# Patient Record
Sex: Male | Born: 1935 | Race: White | Hispanic: No | Marital: Married | Smoking: Never smoker
Health system: Southern US, Community
[De-identification: ages and names within clinical notes are randomized; demographics above are authoritative.]

## PROBLEM LIST (undated history)

## (undated) DIAGNOSIS — M1990 Unspecified osteoarthritis, unspecified site: Secondary | ICD-10-CM

## (undated) DIAGNOSIS — K635 Polyp of colon: Secondary | ICD-10-CM

## (undated) DIAGNOSIS — Z950 Presence of cardiac pacemaker: Secondary | ICD-10-CM

## (undated) DIAGNOSIS — Z87442 Personal history of urinary calculi: Secondary | ICD-10-CM

## (undated) DIAGNOSIS — M545 Low back pain, unspecified: Secondary | ICD-10-CM

## (undated) DIAGNOSIS — R001 Bradycardia, unspecified: Secondary | ICD-10-CM

## (undated) DIAGNOSIS — I10 Essential (primary) hypertension: Secondary | ICD-10-CM

## (undated) DIAGNOSIS — I5189 Other ill-defined heart diseases: Secondary | ICD-10-CM

## (undated) DIAGNOSIS — J849 Interstitial pulmonary disease, unspecified: Secondary | ICD-10-CM

## (undated) DIAGNOSIS — E119 Type 2 diabetes mellitus without complications: Secondary | ICD-10-CM

## (undated) DIAGNOSIS — N189 Chronic kidney disease, unspecified: Secondary | ICD-10-CM

## (undated) DIAGNOSIS — K5730 Diverticulosis of large intestine without perforation or abscess without bleeding: Secondary | ICD-10-CM

## (undated) DIAGNOSIS — K219 Gastro-esophageal reflux disease without esophagitis: Secondary | ICD-10-CM

## (undated) DIAGNOSIS — E559 Vitamin D deficiency, unspecified: Secondary | ICD-10-CM

## (undated) DIAGNOSIS — E785 Hyperlipidemia, unspecified: Secondary | ICD-10-CM

## (undated) DIAGNOSIS — I2510 Atherosclerotic heart disease of native coronary artery without angina pectoris: Secondary | ICD-10-CM

## (undated) HISTORY — DX: Hyperlipidemia, unspecified: E78.5

## (undated) HISTORY — PX: CATARACT EXTRACTION, BILATERAL: SHX1313

## (undated) HISTORY — DX: Other ill-defined heart diseases: I51.89

## (undated) HISTORY — DX: Diverticulosis of large intestine without perforation or abscess without bleeding: K57.30

## (undated) HISTORY — DX: Atherosclerotic heart disease of native coronary artery without angina pectoris: I25.10

## (undated) HISTORY — DX: Low back pain: M54.5

## (undated) HISTORY — DX: Interstitial pulmonary disease, unspecified: J84.9

## (undated) HISTORY — DX: Type 2 diabetes mellitus without complications: E11.9

## (undated) HISTORY — DX: Low back pain, unspecified: M54.50

## (undated) HISTORY — PX: TRIGGER FINGER RELEASE: SHX641

## (undated) HISTORY — DX: Polyp of colon: K63.5

## (undated) HISTORY — DX: Unspecified osteoarthritis, unspecified site: M19.90

## (undated) HISTORY — DX: Essential (primary) hypertension: I10

## (undated) HISTORY — DX: Vitamin D deficiency, unspecified: E55.9

## (undated) HISTORY — PX: CARPAL TUNNEL RELEASE: SHX101

---

## 1978-10-23 HISTORY — PX: INGUINAL HERNIA REPAIR: SUR1180

## 1998-12-06 ENCOUNTER — Ambulatory Visit (HOSPITAL_COMMUNITY): Admission: RE | Admit: 1998-12-06 | Discharge: 1998-12-06 | Payer: Self-pay | Admitting: Gastroenterology

## 2001-12-10 ENCOUNTER — Ambulatory Visit (HOSPITAL_COMMUNITY): Admission: RE | Admit: 2001-12-10 | Discharge: 2001-12-10 | Payer: Self-pay | Admitting: Gastroenterology

## 2001-12-10 ENCOUNTER — Encounter (INDEPENDENT_AMBULATORY_CARE_PROVIDER_SITE_OTHER): Payer: Self-pay | Admitting: *Deleted

## 2002-12-10 ENCOUNTER — Encounter: Admission: RE | Admit: 2002-12-10 | Discharge: 2003-03-10 | Payer: Self-pay | Admitting: Family Medicine

## 2003-03-24 ENCOUNTER — Encounter: Admission: RE | Admit: 2003-03-24 | Discharge: 2003-06-22 | Payer: Self-pay | Admitting: Family Medicine

## 2003-03-26 ENCOUNTER — Ambulatory Visit (HOSPITAL_BASED_OUTPATIENT_CLINIC_OR_DEPARTMENT_OTHER): Admission: RE | Admit: 2003-03-26 | Discharge: 2003-03-26 | Payer: Self-pay | Admitting: Orthopedic Surgery

## 2003-09-23 ENCOUNTER — Ambulatory Visit (HOSPITAL_COMMUNITY): Admission: RE | Admit: 2003-09-23 | Discharge: 2003-09-23 | Payer: Self-pay | Admitting: Orthopedic Surgery

## 2003-09-23 ENCOUNTER — Ambulatory Visit (HOSPITAL_BASED_OUTPATIENT_CLINIC_OR_DEPARTMENT_OTHER): Admission: RE | Admit: 2003-09-23 | Discharge: 2003-09-23 | Payer: Self-pay | Admitting: Orthopedic Surgery

## 2005-10-23 HISTORY — PX: KNEE ARTHROSCOPY: SUR90

## 2009-02-20 DIAGNOSIS — I2510 Atherosclerotic heart disease of native coronary artery without angina pectoris: Secondary | ICD-10-CM

## 2009-02-20 HISTORY — DX: Atherosclerotic heart disease of native coronary artery without angina pectoris: I25.10

## 2009-02-22 ENCOUNTER — Encounter: Payer: Self-pay | Admitting: Internal Medicine

## 2009-03-08 ENCOUNTER — Encounter: Admission: RE | Admit: 2009-03-08 | Discharge: 2009-03-08 | Payer: Self-pay | Admitting: Cardiology

## 2009-03-16 ENCOUNTER — Encounter: Payer: Self-pay | Admitting: Cardiothoracic Surgery

## 2009-03-16 ENCOUNTER — Inpatient Hospital Stay (HOSPITAL_COMMUNITY): Admission: RE | Admit: 2009-03-16 | Discharge: 2009-03-22 | Payer: Self-pay | Admitting: Cardiology

## 2009-03-16 ENCOUNTER — Ambulatory Visit: Payer: Self-pay | Admitting: Cardiothoracic Surgery

## 2009-03-17 HISTORY — PX: CORONARY ARTERY BYPASS GRAFT: SHX141

## 2009-04-09 ENCOUNTER — Ambulatory Visit: Payer: Self-pay | Admitting: Cardiothoracic Surgery

## 2009-04-09 ENCOUNTER — Encounter: Admission: RE | Admit: 2009-04-09 | Discharge: 2009-04-09 | Payer: Self-pay | Admitting: Cardiothoracic Surgery

## 2009-04-30 ENCOUNTER — Ambulatory Visit: Payer: Self-pay | Admitting: Cardiothoracic Surgery

## 2009-09-14 ENCOUNTER — Encounter: Payer: Self-pay | Admitting: Internal Medicine

## 2009-09-14 ENCOUNTER — Encounter: Payer: Self-pay | Admitting: Cardiovascular Disease

## 2009-09-14 LAB — CONVERTED CEMR LAB
Cholesterol: 132 mg/dL
LDL Cholesterol: 54 mg/dL

## 2009-11-24 ENCOUNTER — Ambulatory Visit: Payer: Self-pay | Admitting: Cardiothoracic Surgery

## 2009-11-30 ENCOUNTER — Ambulatory Visit: Payer: Self-pay | Admitting: Cardiovascular Disease

## 2009-11-30 ENCOUNTER — Ambulatory Visit (HOSPITAL_COMMUNITY): Admission: RE | Admit: 2009-11-30 | Discharge: 2009-11-30 | Payer: Self-pay | Admitting: Cardiothoracic Surgery

## 2009-11-30 ENCOUNTER — Encounter: Payer: Self-pay | Admitting: Cardiothoracic Surgery

## 2009-12-01 ENCOUNTER — Ambulatory Visit: Payer: Self-pay | Admitting: Cardiovascular Disease

## 2009-12-01 DIAGNOSIS — R0602 Shortness of breath: Secondary | ICD-10-CM

## 2009-12-01 DIAGNOSIS — E785 Hyperlipidemia, unspecified: Secondary | ICD-10-CM | POA: Insufficient documentation

## 2009-12-01 DIAGNOSIS — R0609 Other forms of dyspnea: Secondary | ICD-10-CM | POA: Insufficient documentation

## 2009-12-01 DIAGNOSIS — E0822 Diabetes mellitus due to underlying condition with diabetic chronic kidney disease: Secondary | ICD-10-CM | POA: Insufficient documentation

## 2009-12-01 DIAGNOSIS — R079 Chest pain, unspecified: Secondary | ICD-10-CM | POA: Insufficient documentation

## 2009-12-01 DIAGNOSIS — E1149 Type 2 diabetes mellitus with other diabetic neurological complication: Secondary | ICD-10-CM

## 2009-12-01 DIAGNOSIS — I10 Essential (primary) hypertension: Secondary | ICD-10-CM | POA: Insufficient documentation

## 2009-12-01 DIAGNOSIS — R072 Precordial pain: Secondary | ICD-10-CM | POA: Insufficient documentation

## 2009-12-01 DIAGNOSIS — I25810 Atherosclerosis of coronary artery bypass graft(s) without angina pectoris: Secondary | ICD-10-CM | POA: Insufficient documentation

## 2009-12-08 ENCOUNTER — Encounter: Admission: RE | Admit: 2009-12-08 | Discharge: 2009-12-08 | Payer: Self-pay | Admitting: Cardiothoracic Surgery

## 2009-12-08 ENCOUNTER — Ambulatory Visit: Payer: Self-pay | Admitting: Cardiothoracic Surgery

## 2009-12-15 ENCOUNTER — Ambulatory Visit: Payer: Self-pay | Admitting: Cardiothoracic Surgery

## 2009-12-17 ENCOUNTER — Encounter: Payer: Self-pay | Admitting: Cardiovascular Disease

## 2010-03-11 ENCOUNTER — Encounter: Payer: Self-pay | Admitting: Internal Medicine

## 2010-03-11 LAB — CONVERTED CEMR LAB
ALT: 17 units/L
AST: 16 units/L
Alkaline Phosphatase: 58 units/L
Calcium: 9.5 mg/dL
Chloride: 102 meq/L
Cholesterol: 153 mg/dL
HDL: 51 mg/dL
Hgb A1c MFr Bld: 6.5 %
Total Bilirubin: 0.5 mg/dL
Total Protein: 7 g/dL

## 2010-03-16 ENCOUNTER — Encounter: Payer: Self-pay | Admitting: Internal Medicine

## 2010-09-22 ENCOUNTER — Encounter: Payer: Self-pay | Admitting: Internal Medicine

## 2010-09-22 LAB — CONVERTED CEMR LAB
AST: 18 units/L
Alkaline Phosphatase: 62 units/L
BUN: 19 mg/dL
CO2: 23 meq/L
Calcium: 9.5 mg/dL
Cholesterol: 146 mg/dL
Creatinine, Ser: 0.82 mg/dL
Glucose, Bld: 113 mg/dL
HDL: 52 mg/dL
Hgb A1c MFr Bld: 6.7 %
Triglyceride fasting, serum: 148 mg/dL

## 2010-10-26 ENCOUNTER — Telehealth: Payer: Self-pay | Admitting: Cardiovascular Disease

## 2010-10-28 ENCOUNTER — Encounter: Payer: Self-pay | Admitting: Cardiology

## 2010-10-31 ENCOUNTER — Encounter: Payer: Self-pay | Admitting: Cardiovascular Disease

## 2010-11-03 ENCOUNTER — Encounter: Payer: Self-pay | Admitting: Internal Medicine

## 2010-11-03 DIAGNOSIS — M1990 Unspecified osteoarthritis, unspecified site: Secondary | ICD-10-CM | POA: Insufficient documentation

## 2010-11-03 DIAGNOSIS — Z860100 Personal history of colon polyps, unspecified: Secondary | ICD-10-CM | POA: Insufficient documentation

## 2010-11-03 DIAGNOSIS — K5730 Diverticulosis of large intestine without perforation or abscess without bleeding: Secondary | ICD-10-CM | POA: Insufficient documentation

## 2010-11-03 DIAGNOSIS — E559 Vitamin D deficiency, unspecified: Secondary | ICD-10-CM | POA: Insufficient documentation

## 2010-11-10 ENCOUNTER — Telehealth: Payer: Self-pay | Admitting: Cardiovascular Disease

## 2010-11-11 ENCOUNTER — Encounter: Payer: Self-pay | Admitting: Cardiovascular Disease

## 2010-11-20 LAB — CONVERTED CEMR LAB
AST: 11 units/L
Alkaline Phosphatase: 59 units/L
BUN: 25 mg/dL
CO2: 24 meq/L
Calcium: 9.2 mg/dL
Creatinine, Ser: 0.85 mg/dL
HDL: 46 mg/dL
LDL Cholesterol: 54 mg/dL
Total Bilirubin: 0.4 mg/dL
Total Protein: 6.4 g/dL
Triglyceride fasting, serum: 158 mg/dL

## 2010-11-24 NOTE — Letter (Signed)
Summary: Medication Clearance  Medication Clearance   Imported By: Harlon Flor 11/15/2010 16:54:25  _____________________________________________________________________  External Attachment:    Type:   Image     Comment:   External Document

## 2010-11-24 NOTE — Progress Notes (Signed)
Summary: PHI  PHI   Imported By: Harlon Flor 12/07/2009 16:07:06  _____________________________________________________________________  External Attachment:    Type:   Image     Comment:   External Document

## 2010-11-24 NOTE — Letter (Signed)
Summary: Clearance Letter  Architectural technologist at [facility] [address]   [place], [place] [address]   Phone: [phone]  Fax: [phone]    November 11, 2010  Re:     [facility] Address:   [address]     [place], [place]  [address] DOB:     Aug 04, 1934 MRN:     [id number]   Dear Pamalee Leyden,  Mr. Chio may stop Plavix 7 days prior to epidural injection. He should resume Plavix post operatively.             Sincerely,          Dr.Tim Mariah Milling

## 2010-11-24 NOTE — Progress Notes (Signed)
Summary: Plavix  Phone Note Call from Patient Call back at Home Phone [phone]   Caller: Patient Call For: [place] [employer] Details for Reason: Plavix Summary of Call: pt is having another epidural injection sometime after 11/28/2010 with Dr. Monica Martinez. Is it ok for him to come off Plavix for this injection? He recently came off Plavix for a injection and doctor stated he will need another one.  fax letter to [phone] attn: Darl Pikes Initial call taken by: Lysbeth Galas CMA,  November 10, 2010 12:50 PM  Follow-up for Phone Call        Please advise. Follow-up by: Lanny Hurst RN,  November 10, 2010 2:08 PM  Additional Follow-up for Phone Call Additional follow up Details #1::        OK to come off plavix, restart after procedure as before     Appended Document: Plavix Clearance letter sent to Dr.Ibazbeo/sab

## 2010-11-24 NOTE — Miscellaneous (Signed)
Summary: lab results  Clinical Lists Changes  Observations: Added new observation of HGBA1C: 7.0 % (09/14/2009 15:37) Added new observation of TRIGLYCERIDE: 158 mg/dL (16/07/9603 54:09) Added new observation of HDL: 46 mg/dL (81/19/1478 29:56) Added new observation of LDL: 54 mg/dL (21/30/8657 84:69) Added new observation of GFRAA: >59 mL/min (09/14/2009 15:37) Added new observation of GFR: >59 mL/min (09/14/2009 15:37) Added new observation of ALBUMIN: 4.2 g/dL (62/95/2841 32:44) Added new observation of PROTEIN, TOT: 6.4 g/dL (10/25/7251 66:44) Added new observation of CHOLESTEROL: 132 mg/dL (03/47/4259 56:38) Added new observation of CALCIUM: 9.2 mg/dL (75/64/3329 51:88) Added new observation of ALK PHOS: 59 units/L (09/14/2009 15:37) Added new observation of BILI TOTAL: 0.4 mg/dL (41/66/0630 16:01) Added new observation of SGPT (ALT): 17 units/L (09/14/2009 15:37) Added new observation of SGOT (AST): 11 units/L (09/14/2009 15:37) Added new observation of CO2 PLSM/SER: 24 meq/L (09/14/2009 15:37) Added new observation of CL SERUM: 103 meq/L (09/14/2009 15:37) Added new observation of K SERUM: 4.5 meq/L (09/14/2009 15:37) Added new observation of NA: 140 meq/L (09/14/2009 15:37) Added new observation of CREATININE: 0.85 mg/dL (09/32/3557 32:20) Added new observation of BUN: 25 mg/dL (25/42/7062 37:62) Added new observation of BG RANDOM: 106 mg/dL (83/15/1761 60:73)      -  Date:  09/14/2009    BG Random: 106    BUN: 25    Creatinine: 0.85    Sodium: 140    Potassium: 4.5    Chloride: 103    CO2 Total: 24    SGOT (AST): 11    SGPT (ALT): 17    T. Bilirubin: 0.4    Alk Phos: 59    Calcium: 9.2    Cholesterol: 132    Total Protein: 6.4    Albumin: 4.2    GFR(Non African American): >59    GFR(African American) >59    LDL: 54    HDL: 46    Triglycerides: 710    HgbA1c: 7.0

## 2010-11-24 NOTE — Letter (Signed)
Summary: Clearance Letter  Architectural technologist at [address]   [place], [place] [address]   Phone: [phone]  Fax: [phone]    October 31, 2010  Re:     [facility] Address:   [address]     [place], [place]  [address] DOB:     Jan 24, 1934 MRN:     [id number]   Dear Dr. Romero Belling,   Mr. Colarusso may stop Plavix 7 days prior to epidural injection.  He should resume the Plavix post-operatively.           Sincerely,    Dossie Arbour, M.D.

## 2010-11-24 NOTE — Assessment & Plan Note (Signed)
Summary: np6   Visit Type:  New patient Primary Provider:  Ursula Beath  CC:  SOB wit exsertion, chest discomfort, and no swelling.  History of Present Illness: Andrew [name] is a very pleasant 77 year old gentleman with history of coronary artery disease, bypass in May 2010, hyperlipidemia, diabetes, hypertension, who presents for evaluation of recent chest pain.  He states he's had chest pain on the left side of his chest that is tender to palpation, worse with cough and sneezing. He had bronchitis in November of 2010 and his symptoms have persisted since then. He was recently seen by his  surgeon in [place], Dr. Alla German, and started on a muscle relaxant medication. He states that this is helped a little bit though it continues to hurt. He wanted further evaluation. He states that it hurts when he lifts things that are heavy with his left arm. He denies any significant shortness of breath. He is otherwise very active with no significant chest pain. He describes it is superficial, worse with movement.  Preventive Screening-Counseling & Management  Alcohol-Tobacco     Alcohol drinks/day: <1     Smoking Status: never  Caffeine-Diet-Exercise     Caffeine use/day: 1 cup of coffee a day     Does Patient Exercise: no      Drug Use:  no.    Current Problems (verified): 1)  Dm  (ICD-250.00) 2)  Shortness of Breath  (ICD-786.05)  Current Medications (verified): 1)  Plavix 75 Mg Tabs (Clopidogrel Bisulfate) .... Take One Tablet By Mouth Daily 2)  Aspirin 81 Mg Tbec (Aspirin) .... Take One Tablet By Mouth Daily 3)  Crestor 20 Mg Tabs (Rosuvastatin Calcium) .... Take One Tablet By Mouth Daily. 4)  Niaspan 1000 Mg Cr-Tabs (Niacin (Antihyperlipidemic)) .Marland Kitchen.. 1 Tab By Mouth Once Daily 5)  Metformin Hcl 500 Mg Tabs (Metformin Hcl) .... 2 Tab By Mouth Once Daily 6)  Vitamin D 2000 Unit Caps (Cholecalciferol) .Marland Kitchen.. 1 Capsule By Mouth Once Daily 7)  Carvedilol 6.25 Mg Tabs (Carvedilol) ....  Take 1/2  Tablet By Mouth Twice A Day 8)  Lisinopril 10 Mg Tabs (Lisinopril) .... Take 1/2  Tablet By Mouth Daily 9)  Tramadol Hcl 50 Mg Tabs (Tramadol Hcl) .... As Needed  Allergies (verified): 1)  ! * Oral Penicillin  Past History:  Past Medical History: Last updated: 11/29/2009  Unstable angina with severe left main and three-vessel   coronary artery disease.    Acute blood loss anemia postoperatively.   Volume overload postoperatively.   Bleeding following multivessel coronary artery bypass grafting,       coagulopathy.    Diabetes mellitus.   Dyslipidemia.   Status post arthroscopic right knee surgery in 2007.   Inguinal hernia repair in 1980.   Family History: Last updated: 12/01/2009 positive for diabetes.  Negative for coronary artery disease Father: Deceased at 21 years old of CHF Mother: Deceased at 20 years old of CHF Family History of Diabetes:   Social History: Last updated: 12/01/2009 Retired 7/03 Tobacco Use - No.  Alcohol Use - no Alcohol Use - yes Regular Exercise - no Drug Use - no Married   Risk Factors: Alcohol Use: <1 (12/01/2009) Caffeine Use: 1 cup of coffee a day (12/01/2009) Exercise: no (12/01/2009)  Risk Factors: Smoking Status: never (12/01/2009)  Past Surgical History: CABG Quadruple bypass surgery 03/17/09  Family History: positive for diabetes.  Negative for coronary artery disease Father: Deceased at 66 years old of CHF Mother: Deceased at 67 years old  of CHF Family History of Diabetes:   Social History: Retired 7/03 Tobacco Use - No.  Alcohol Use - no Alcohol Use - yes Regular Exercise - no Drug Use - no Married  Alcohol drinks/day:  <1 Caffeine use/day:  1 cup of coffee a day Does Patient Exercise:  no Drug Use:  no  Vital Signs:  Patient profile:   77 year old male Height:      68 inches Weight:      191.50 pounds BMI:     29.22 Pulse (ortho):   60 / minute Pulse rhythm:   regular BP sitting:   118 / 70   (left arm) Cuff size:   regular  Vitals Entered By: Mercer Pod (December 01, 2009 10:25 AM)  Physical Exam  General:  HEENT exam is benign, oropharynx is clear,  the neck is supple with no JVP or carotid bruits, heart sounds are regular with normal S1-S2 and no murmur, lungs are clear to auscultation with no wheezes no Rales, chest is tender to palpation on the left, abdominal exam is benign, no significant lower extremity edema, pulses are equal and symmetrical in his upper and lower extremities. Neurologic exam is grossly nonfocal,  skin is warm and dry.    EKG  Procedure date:  12/01/2009  Findings:      EKG shows normal sinus rhythm, rate of 63 beats per minute, no significant ST or T wave changes.  Impression & Recommendations:  Problem # 1:  CHEST PAIN-UNSPECIFIED (ICD-786.50) several months of chest pain that is clearly musculoskeletal. I have suggested that he add a nonsteroidal anti-inflammatory to his Flexeril. He can also use moist heat, no heavy lifting. No other cardiac testing needed at this time. His updated medication list for this problem includes:    Plavix 75 Mg Tabs (Clopidogrel bisulfate) .Marland Kitchen... Take one tablet by mouth daily    Aspirin 81 Mg Tbec (Aspirin) .Marland Kitchen... Take one tablet by mouth daily    Carvedilol 6.25 Mg Tabs (Carvedilol) .Marland Kitchen... Take 1/2  tablet by mouth twice a day    Lisinopril 10 Mg Tabs (Lisinopril) .Marland Kitchen... Take 1/2  tablet by mouth daily  Problem # 2:  DIAB W/O COMP TYPE II/UNS NOT STATED UNCNTRL (ICD-250.00) his metformin was recently raised to b.i.d. for improved sugar control. He continues to exercise on a frequent basis and tries to watch his diet. We'll try to obtain his most recent hemoglobin A1c. His updated medication list for this problem includes:    Aspirin 81 Mg Tbec (Aspirin) .Marland Kitchen... Take one tablet by mouth daily    Metformin Hcl 500 Mg Tabs (Metformin hcl) .Marland Kitchen... 2 tab by mouth once daily    Lisinopril 10 Mg Tabs (Lisinopril) .Marland Kitchen...  Take 1/2  tablet by mouth daily  Problem # 3:  CAD, ARTERY BYPASS GRAFT (ICD-414.04) bypass last year and is doing well. He states that he had an echocardiogram at [facility] yesterday we'll try to obtain this report for our records. Will also try to obtain his most recent lipid panel. His updated medication list for this problem includes:    Plavix 75 Mg Tabs (Clopidogrel bisulfate) .Marland Kitchen... Take one tablet by mouth daily    Aspirin 81 Mg Tbec (Aspirin) .Marland Kitchen... Take one tablet by mouth daily    Carvedilol 6.25 Mg Tabs (Carvedilol) .Marland Kitchen... Take 1/2  tablet by mouth twice a day    Lisinopril 10 Mg Tabs (Lisinopril) .Marland Kitchen... Take 1/2  tablet by mouth daily  Problem # 4:  HYPERLIPIDEMIA-MIXED (ICD-272.4) currently taking Crestor and Niaspan. We'll try to obtain his most recent lipid panel from Dr. Raquel James from December 2010. Follow up in clinic in 1 years time. His updated medication list for this problem includes:    Crestor 20 Mg Tabs (Rosuvastatin calcium) .Marland Kitchen... Take one tablet by mouth daily.    Niaspan 1000 Mg Cr-tabs (Niacin (antihyperlipidemic)) .Marland Kitchen... 1 tab by mouth once daily

## 2010-11-24 NOTE — Progress Notes (Signed)
Summary: Discontinuing Plavix  Phone Note Call from Patient Call back at Home Phone [phone] Message from:  Patient on October 26, 2010 9:24 AM  Caller: Self Call For: [facility] Summary of Call: Pt had an MRI and it was determined that he had a buldging disk.  They are wanting to give him a spinal epidural.  Pt is on Plavix and it needs to be stopped 7 days prior to injection.  Dr. Romero Belling needs a letter clearing him from the Plavix.  fax # [phone]  Attn: Darl Pikes Initial call taken by: Harlon Flor,  October 26, 2010 9:26 AM  Follow-up for Phone Call        Notified patient regarding plavix.  He was put on plavix after CABG in November 2010. Follow-up by: Bishop Dublin, CMA,  October 27, 2010 10:55 AM  Additional Follow-up for Phone Call Additional follow up Details #1::        that should be fine. Would go back on plavix after surgery     Appended Document: Discontinuing Plavix Clearance letter faxed to Dr.Angus Ibazebo to att. Darl Pikes.

## 2010-11-24 NOTE — Letter (Signed)
Summary: Clearance Letter  Architectural technologist at [facility] [address]   [place], [place] [address]   Phone: [phone]  Fax: [phone]    October 28, 2010    Re:     [facility] Address:   [address]     [place], [place]  [address] DOB:     [birth year] MRN:     [id number]   Dear Dr. Romero Belling,    Mr. Munford may stop Plavix 7 days prior to epidural injection.            Sincerely,  Dossie Arbour, M.D.

## 2010-11-25 ENCOUNTER — Ambulatory Visit: Admit: 2010-11-25 | Payer: Self-pay | Admitting: Cardiovascular Disease

## 2010-11-25 ENCOUNTER — Ambulatory Visit (INDEPENDENT_AMBULATORY_CARE_PROVIDER_SITE_OTHER): Payer: BC Managed Care – PPO | Admitting: Cardiovascular Disease

## 2010-11-25 ENCOUNTER — Encounter: Payer: Self-pay | Admitting: Cardiovascular Disease

## 2010-11-25 DIAGNOSIS — E785 Hyperlipidemia, unspecified: Secondary | ICD-10-CM

## 2010-11-25 DIAGNOSIS — I2510 Atherosclerotic heart disease of native coronary artery without angina pectoris: Secondary | ICD-10-CM

## 2010-11-25 DIAGNOSIS — I10 Essential (primary) hypertension: Secondary | ICD-10-CM

## 2010-11-30 NOTE — Assessment & Plan Note (Signed)
Summary: F/U 1 YEAR/AMD   Vital Signs:  Patient profile:   77 year old male Height:      68 inches Weight:      197.50 pounds BMI:     30.14 Pulse rate:   67 / minute BP sitting:   122 / 80  (left arm) Cuff size:   regular  Vitals Entered By: Lysbeth Galas CMA (November 25, 2010 3:15 PM)  Visit Type:  Follow-up Primary Provider:  Dr. Annye Rusk  CC:  c/o fatigue and SOB after walking up stairs. denies chest pains. .  History of Present Illness: Andrew [name] is a very pleasant 77 year old gentleman with history of coronary artery disease, bypass in May 2010, hyperlipidemia, diabetes, hypertension, who presents for Routine evaluation.  overall he has been doing well apart from severe back pain. Previously he was on Celebrex which has helped his back to some degree. His head to hydrocortisone shots but continues to have pain. No history in his right knee from favoring one side over the other side secondary to his back. He does tire easily with stairs with some shortness of breath he was able to ablate on a flat surface without any significant symptoms. He believes he needs 2 more shocks in his back and we'll need to stop his Plavix today for shots next week.  Again he denies any significant chest pain or previous anginal equivalent symptoms. His weight has been stable. Otherwise he feels well.  EKG shows normal sinus rhythm with rate 67 beats per minute, right bundle branch block.  Current Medications (verified): 1)  Plavix 75 Mg Tabs (Clopidogrel Bisulfate) .... Take One Tablet By Mouth Daily 2)  Aspirin 81 Mg Tbec (Aspirin) .... Take One Tablet By Mouth Daily 3)  Crestor 20 Mg Tabs (Rosuvastatin Calcium) .... Take One Tablet By Mouth Daily. 4)  Niaspan 1000 Mg Cr-Tabs (Niacin (Antihyperlipidemic)) .Marland Kitchen.. 1 Tab By Mouth Once Daily 5)  Metformin Hcl 500 Mg Tabs (Metformin Hcl) .... 2 Tab By Mouth Once Daily 6)  Vitamin D 2000 Unit Caps (Cholecalciferol) .Marland Kitchen.. 1 Capsule By Mouth Once Daily 7)   Carvedilol 6.25 Mg Tabs (Carvedilol) .... Take 1/2  Tablet By Mouth Twice A Day 8)  Lisinopril 10 Mg Tabs (Lisinopril) .... Take 1/2  Tablet By Mouth Daily 9)  Celebrex 200 Mg Caps (Celecoxib) .Marland Kitchen.. 1 Tablet Once Daily  Allergies (verified): 1)  ! * Oral Penicillin  Past History:  Past Medical History: Last updated: 11/03/2010  Unstable angina with severe left main and three-vessel   coronary artery disease.    Acute blood loss anemia postoperatively.   Volume overload postoperatively.   Bleeding following multivessel coronary artery bypass grafting,       coagulopathy.    Diabetes mellitus.   Dyslipidemia.   Status post arthroscopic right knee surgery in 2007.   Inguinal hernia repair in 1980.  Colonic polyps, hx of Diverticulosis, colon  Past Surgical History: Last updated: 12/01/2009 CABG Quadruple bypass surgery 03/17/09  Family History: Last updated: 12/01/2009 positive for diabetes.  Negative for coronary artery disease Father: Deceased at 39 years old of CHF Mother: Deceased at 23 years old of CHF Family History of Diabetes:   Social History: Last updated: 12/01/2009 Retired 7/03 Tobacco Use - No.  Alcohol Use - no Alcohol Use - yes Regular Exercise - no Drug Use - no Married   Risk Factors: Alcohol Use: <1 (12/01/2009) Caffeine Use: 1 cup of coffee a day (12/01/2009) Exercise: no (12/01/2009)  Risk  Factors: Smoking Status: never (12/01/2009)  Review of Systems       The patient complains of dyspnea on exertion and difficulty walking.  The patient denies fever, weight loss, weight gain, vision loss, decreased hearing, hoarseness, chest pain, syncope, peripheral edema, prolonged cough, abdominal pain, incontinence, muscle weakness, depression, and enlarged lymph nodes.         Some SOB with stairs. Back pain.   Physical Exam  General:  HEENT exam is benign, oropharynx is clear,  the neck is supple with no JVP or carotid bruits, heart sounds are  regular with normal S1-S2 and no murmur, lungs are clear to auscultation with no wheezes no Rales, chest is tender to palpation on the left, abdominal exam is benign, no significant lower extremity edema, pulses are equal and symmetrical in his upper and lower extremities. Neurologic exam is grossly nonfocal,  skin is warm and dry. Skin:  Intact without lesions or rashes. Psych:  Normal affect.   Impression & Recommendations:  Problem # 1:  CAD, ARTERY BYPASS GRAFT (ICD-414.04) bypass grafting in 2010. No symptoms of angina. We'll continue aggressive medical management with no further testing at this time.  His updated medication list for this problem includes:    Plavix 75 Mg Tabs (Clopidogrel bisulfate) .Marland Kitchen... Take one tablet by mouth daily    Aspirin 81 Mg Tbec (Aspirin) .Marland Kitchen... Take one tablet by mouth daily    Carvedilol 6.25 Mg Tabs (Carvedilol) .Marland Kitchen... Take 1/2  tablet by mouth twice a day    Lisinopril 10 Mg Tabs (Lisinopril) .Marland Kitchen... Take 1/2  tablet by mouth daily  Problem # 2:  HYPERTENSION, BENIGN (ICD-401.1) Blood pressure is adequate and we'll continue him on his current medication regimen.  His updated medication list for this problem includes:    Aspirin 81 Mg Tbec (Aspirin) .Marland Kitchen... Take one tablet by mouth daily    Carvedilol 6.25 Mg Tabs (Carvedilol) .Marland Kitchen... Take 1/2  tablet by mouth twice a day    Lisinopril 10 Mg Tabs (Lisinopril) .Marland Kitchen... Take 1/2  tablet by mouth daily  Problem # 3:  HYPERLIPIDEMIA-MIXED (ICD-272.4) Previous cholesterol last year was well-controlled. Continue him on his current regimen.  His updated medication list for this problem includes:    Crestor 20 Mg Tabs (Rosuvastatin calcium) .Marland Kitchen... Take one tablet by mouth daily.    Niaspan 1000 Mg Cr-tabs (Niacin (antihyperlipidemic)) .Marland Kitchen... 1 tab by mouth once daily  Problem # 4:  SHORTNESS OF BREATH (ICD-786.05) He does have mild shortness of breath with exertion, such as stairs. Some of this could be secondary to  deconditioning. He is limited by his back.  His updated medication list for this problem includes:    Aspirin 81 Mg Tbec (Aspirin) .Marland Kitchen... Take one tablet by mouth daily    Carvedilol 6.25 Mg Tabs (Carvedilol) .Marland Kitchen... Take 1/2  tablet by mouth twice a day    Lisinopril 10 Mg Tabs (Lisinopril) .Marland Kitchen... Take 1/2  tablet by mouth daily  Orders: EKG w/ Interpretation (93000)   Orders Added: 1)  EKG w/ Interpretation [93000]

## 2010-12-15 ENCOUNTER — Encounter: Payer: Self-pay | Admitting: Internal Medicine

## 2010-12-15 ENCOUNTER — Ambulatory Visit (INDEPENDENT_AMBULATORY_CARE_PROVIDER_SITE_OTHER): Payer: 59 | Admitting: Internal Medicine

## 2010-12-15 DIAGNOSIS — I25810 Atherosclerosis of coronary artery bypass graft(s) without angina pectoris: Secondary | ICD-10-CM

## 2010-12-15 DIAGNOSIS — M129 Arthropathy, unspecified: Secondary | ICD-10-CM | POA: Insufficient documentation

## 2010-12-15 DIAGNOSIS — E119 Type 2 diabetes mellitus without complications: Secondary | ICD-10-CM

## 2010-12-15 DIAGNOSIS — I10 Essential (primary) hypertension: Secondary | ICD-10-CM

## 2010-12-15 DIAGNOSIS — E785 Hyperlipidemia, unspecified: Secondary | ICD-10-CM

## 2010-12-17 ENCOUNTER — Encounter: Payer: Self-pay | Admitting: Cardiovascular Disease

## 2010-12-20 NOTE — Assessment & Plan Note (Signed)
Summary: NEW/[employer]/# CD   Vital Signs:  Patient profile:   77 year old male Height:      68 inches (172.72 cm) Weight:      196.0 pounds (89.09 kg) O2 Sat:      97 % on Room air Temp:     98.7 degrees F (37.06 degrees C) oral Pulse rate:   70 / minute BP sitting:   102 / 68  (left arm) Cuff size:   regular  Vitals Entered By: Orlan Leavens RMA (December 15, 2010 9:44 AM)  O2 Flow:  Room air CC: New patient Is Patient Diabetic? Yes Did you bring your meter with you today? No Pain Assessment Patient in pain? no        Primary Care Provider:  Dr. Felicity Coyer  CC:  New patient.  History of Present Illness: new pt to me and our divison, here to est care prev at [facility] - known to our cards group  CAD s/p CABG 02/2009 - reports compliance with ongoing medical treatment and no changes in medication dose or frequency. denies adverse side effects related to current therapy.   DM2 -reports compliance with ongoing medical treatment and no changes in medication dose or frequency. denies adverse side effects related to current therapy.  infreq checks cgs at home  dyslipidemia - reports compliance with ongoing medical treatment and no changes in medication dose or frequency. denies adverse side effects related to current therapy.   HTN - reports compliance with ongoing medical treatment and no changes in medication dose or frequency. denies adverse side effects related to current therapy.   LBP - follows with ortho for same - s/p series of steroid injections for lumbar disc with RLE radiculopathy - improved back pain but cont B hip discomfort - ongoing care   Preventive Screening-Counseling & Management  Alcohol-Tobacco     Alcohol drinks/day: <1     Alcohol Counseling: not indicated; use of alcohol is not excessive or problematic     Smoking Status: never     Tobacco Counseling: not indicated; no tobacco use  Caffeine-Diet-Exercise     Caffeine use/day: 1 cup of coffee a day     Does  Patient Exercise: no     Exercise Counseling: to improve exercise regimen  Clinical Review Panels:  Prevention   Last Colonoscopy:  Pt states was done @ [facility], was normal was told to repeat every 5 years (09/22/2010)   Last PSA:  0.6 (09/22/2010)  Immunizations   Last Tetanus Booster:  Historical (11/07/2002)  Lipid Management   Cholesterol:  146 (09/22/2010)   LDL (bad choesterol):  64 (09/22/2010)   HDL (good cholesterol):  52 (09/22/2010)   Triglycerides:  148 (09/22/2010)  Diabetes Management   HgBA1C:  6.7 (09/22/2010)   Creatinine:  0.82 (09/22/2010)   Last Dilated Eye Exam:  normal (04/20/2010)  Complete Metabolic Panel   Glucose:  113 (09/22/2010)   Sodium:  142 (09/22/2010)   Potassium:  4.0 (09/22/2010)   Chloride:  105 (09/22/2010)   CO2:  23 (09/22/2010)   BUN:  19 (09/22/2010)   Creatinine:  0.82 (09/22/2010)   Albumin:  4.4 (09/22/2010)   Total Protein:  7.0 (09/22/2010)   Calcium:  9.5 (09/22/2010)   Total Bili:  0.4 (09/22/2010)   Alk Phos:  62 (09/22/2010)   SGPT (ALT):  19 (09/22/2010)   SGOT (AST):  18 (09/22/2010)   Current Medications (verified): 1)  Plavix 75 Mg Tabs (Clopidogrel Bisulfate) .... Take One Tablet By  Mouth Daily 2)  Aspirin 81 Mg Tbec (Aspirin) .... Take One Tablet By Mouth Daily 3)  Crestor 20 Mg Tabs (Rosuvastatin Calcium) .... Take One Tablet By Mouth Daily. 4)  Niaspan 1000 Mg Cr-Tabs (Niacin (Antihyperlipidemic)) .Marland Kitchen.. 1 Tab By Mouth Once Daily 5)  Metformin Hcl 500 Mg Tabs (Metformin Hcl) .... 2 Tab By Mouth Once Daily 6)  Vitamin D 2000 Unit Caps (Cholecalciferol) .Marland Kitchen.. 1 Capsule By Mouth Once Daily 7)  Carvedilol 6.25 Mg Tabs (Carvedilol) .... Take 1/2  Tablet By Mouth Twice A Day 8)  Lisinopril 10 Mg Tabs (Lisinopril) .... Take 1/2  Tablet By Mouth Daily 9)  Celebrex 200 Mg Caps (Celecoxib) .Marland Kitchen.. 1 Tablet Once Daily  Allergies (verified): 1)  ! * Oral Penicillin  Past History:  Past Medical History: CAD s/p CABG 02/2009  - severe left main and three-vessel   Acute blood loss anemia postoperatively.   Volume overload postoperatively.   Diabetes mellitus type 2  Dyslipidemia Hypertension Colonic polyps, hx of Diverticulosis, colon  MD roster: ortho - [facility] card - [facility] - [facility]  Past Surgical History: CABGx4- 03/17/2009  Status post arthroscopic right knee surgery in 2007.   Inguinal hernia repair in 1980.   Family History: positive for diabetes.  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  Alcohol Use - no Alcohol Use - yes Regular Exercise - no Drug Use - no Married , 3 grown children  Review of Systems       see HPI above. I have reviewed all other systems and they were negative.   Physical Exam  General:  alert, well-developed, well-nourished, and cooperative to examination.   wife at side Head:  Normocephalic and atraumatic without obvious abnormalities. No apparent alopecia or balding. Eyes:  vision grossly intact; pupils equal, round and reactive to light.  conjunctiva and lids normal.    Ears:  R ear normal and L ear normal.   Mouth:  teeth and gums in good repair; mucous membranes moist, without lesions or ulcers. oropharynx clear without exudate, no erythema.  Neck:  supple, full ROM, no masses, no thyromegaly; no thyroid nodules or tenderness. no JVD or carotid bruits.   Lungs:  normal respiratory effort, no intercostal retractions or use of accessory muscles; normal breath sounds bilaterally - no crackles and no wheezes.    Heart:  normal rate, regular rhythm, no murmur, and no rub. BLE without edema. Psych:  Oriented X3, memory intact for recent and remote, normally interactive, good eye contact, not anxious appearing, not depressed appearing, and not agitated.      Diabetes Management Exam:    Eye Exam:       Eye Exam done elsewhere          Date: 04/20/2010          Results: normal          Done by: Dr. Wynona Luna   Impression & Recommendations:  Problem # 1:  DIAB W/O COMP TYPE II/UNS NOT STATED UNCNTRL (ICD-250.00)  no recent med changes - a1c shows good control - cont same records from prior PCP reviewed today His updated medication list for this problem includes:    Aspirin 81 Mg Tbec (Aspirin) .Marland Kitchen... Take one tablet by mouth daily    Metformin Hcl  500 Mg Tabs (Metformin hcl) .Marland Kitchen... 2 tab by mouth once daily    Lisinopril 10 Mg Tabs (Lisinopril) .Marland Kitchen... Take 1/2  tablet by mouth daily  Labs Reviewed: Creat: 0.82 (09/22/2010)     Last Eye Exam: normal (04/20/2010) Reviewed HgBA1c results: 6.7 (09/22/2010)  6.5 (03/11/2010)  Problem # 2:  HYPERTENSION, BENIGN (ICD-401.1)  His updated medication list for this problem includes:    Carvedilol 6.25 Mg Tabs (Carvedilol) .Marland Kitchen... Take 1/2  tablet by mouth twice a day    Lisinopril 10 Mg Tabs (Lisinopril) .Marland Kitchen... Take 1/2  tablet by mouth daily  BP today: 102/68 Prior BP: 122/80 (11/25/2010)  Labs Reviewed: K+: 4.0 (09/22/2010) Creat: : 0.82 (09/22/2010)   Chol: 146 (09/22/2010)   HDL: 52 (09/22/2010)   LDL: 64 (09/22/2010)   TG: 148 (09/22/2010)  Problem # 3:  HYPERLIPIDEMIA-MIXED (ICD-272.4)  His updated medication list for this problem includes:    Crestor 20 Mg Tabs (Rosuvastatin calcium) .Marland Kitchen... Take one tablet by mouth daily.    Niaspan 1000 Mg Cr-tabs (Niacin (antihyperlipidemic)) .Marland Kitchen... 1 tab by mouth once daily  Labs Reviewed: SGOT: 18 (09/22/2010)   SGPT: 19 (09/22/2010)   HDL:52 (09/22/2010), 51 (03/11/2010)  LDL:64 (09/22/2010), 70 (03/11/2010)  Chol:146 (09/22/2010), 153 (03/11/2010)  Trig:148 (09/22/2010), 162 (03/11/2010)  Problem # 4:  CAD, ARTERY BYPASS GRAFT (ICD-414.04)  His updated medication list for this problem includes:    Plavix 75 Mg Tabs (Clopidogrel  bisulfate) .Marland Kitchen... Take one tablet by mouth daily    Aspirin 81 Mg Tbec (Aspirin) .Marland Kitchen... Take one tablet by mouth daily    Carvedilol 6.25 Mg Tabs (Carvedilol) .Marland Kitchen... Take 1/2  tablet by mouth twice a day    Lisinopril 10 Mg Tabs (Lisinopril) .Marland Kitchen... Take 1/2  tablet by mouth daily  bypass grafting in 02/2009. No symptoms of angina.  follows annually with cards - no med changes rec  Labs Reviewed: Chol: 146 (09/22/2010)   HDL: 52 (09/22/2010)   LDL: 64 (09/22/2010)   TG: 148 (09/22/2010)  Problem # 5:  ARTHRITIS (ICD-716.90) follows with ortho for back and hip pain symptoms -  ongoing steroid injections likely exac DM a little cont celebrex and f/u as ongoing  Complete Medication List: 1)  Plavix 75 Mg Tabs (Clopidogrel bisulfate) .... Take one tablet by mouth daily 2)  Aspirin 81 Mg Tbec (Aspirin) .... Take one tablet by mouth daily 3)  Crestor 20 Mg Tabs (Rosuvastatin calcium) .... Take one tablet by mouth daily. 4)  Niaspan 1000 Mg Cr-tabs (Niacin (antihyperlipidemic)) .Marland Kitchen.. 1 tab by mouth once daily 5)  Metformin Hcl 500 Mg Tabs (Metformin hcl) .... 2 tab by mouth once daily 6)  Vitamin D 2000 Unit Caps (Cholecalciferol) .Marland Kitchen.. 1 capsule by mouth once daily 7)  Carvedilol 6.25 Mg Tabs (Carvedilol) .... Take 1/2  tablet by mouth twice a day 8)  Lisinopril 10 Mg Tabs (Lisinopril) .... Take 1/2  tablet by mouth daily 9)  Celebrex 200 Mg Caps (Celecoxib) .Marland Kitchen.. 1 tablet by mouth two times a day  Patient Instructions: 1)  it was good to see you today.   2)  medications and history reviewed - no changes recommended 3)  Please schedule a follow-up appointment in June 2012 for labs and diabetes/cholesterol check, call sooner if problems.    Orders Added: 1)  New Patient Level III [99203]     Colonoscopy  Procedure date:  09/22/2010  Findings:      Pt states was done @ [facility], was normal was  told to repeat every 5 years

## 2010-12-29 NOTE — Letter (Signed)
Summary: [facility]  [facility]   Imported By: Sherian Rein 12/20/2010 14:32:09  _____________________________________________________________________  External Attachment:    Type:   Image     Comment:   External Document

## 2011-01-31 LAB — CBC
HCT: 18.7 % — ABNORMAL LOW (ref 39.0–52.0)
HCT: 21.2 % — ABNORMAL LOW (ref 39.0–52.0)
HCT: 23.4 % — ABNORMAL LOW (ref 39.0–52.0)
HCT: 23.6 % — ABNORMAL LOW (ref 39.0–52.0)
HCT: 25 % — ABNORMAL LOW (ref 39.0–52.0)
HCT: 25.4 % — ABNORMAL LOW (ref 39.0–52.0)
HCT: 26.5 % — ABNORMAL LOW (ref 39.0–52.0)
HCT: 28.5 % — ABNORMAL LOW (ref 39.0–52.0)
Hemoglobin: 10 g/dL — ABNORMAL LOW (ref 13.0–17.0)
Hemoglobin: 6.6 g/dL — CL (ref 13.0–17.0)
Hemoglobin: 7.4 g/dL — CL (ref 13.0–17.0)
Hemoglobin: 8.2 g/dL — ABNORMAL LOW (ref 13.0–17.0)
Hemoglobin: 8.3 g/dL — ABNORMAL LOW (ref 13.0–17.0)
Hemoglobin: 8.6 g/dL — ABNORMAL LOW (ref 13.0–17.0)
Hemoglobin: 8.8 g/dL — ABNORMAL LOW (ref 13.0–17.0)
Hemoglobin: 9.2 g/dL — ABNORMAL LOW (ref 13.0–17.0)
MCHC: 34.2 g/dL (ref 30.0–36.0)
MCHC: 34.5 g/dL (ref 30.0–36.0)
MCHC: 34.6 g/dL (ref 30.0–36.0)
MCHC: 34.8 g/dL (ref 30.0–36.0)
MCHC: 34.9 g/dL (ref 30.0–36.0)
MCHC: 35 g/dL (ref 30.0–36.0)
MCHC: 35.1 g/dL (ref 30.0–36.0)
MCHC: 35.2 g/dL (ref 30.0–36.0)
MCHC: 35.4 g/dL (ref 30.0–36.0)
MCV: 91.5 fL (ref 78.0–100.0)
MCV: 91.6 fL (ref 78.0–100.0)
MCV: 91.6 fL (ref 78.0–100.0)
MCV: 91.6 fL (ref 78.0–100.0)
MCV: 91.9 fL (ref 78.0–100.0)
MCV: 92.5 fL (ref 78.0–100.0)
MCV: 92.6 fL (ref 78.0–100.0)
MCV: 95.5 fL (ref 78.0–100.0)
MCV: 97 fL (ref 78.0–100.0)
Platelets: 119 10*3/uL — ABNORMAL LOW (ref 150–400)
Platelets: 121 10*3/uL — ABNORMAL LOW (ref 150–400)
Platelets: 145 10*3/uL — ABNORMAL LOW (ref 150–400)
Platelets: 146 10*3/uL — ABNORMAL LOW (ref 150–400)
Platelets: 147 10*3/uL — ABNORMAL LOW (ref 150–400)
Platelets: 148 10*3/uL — ABNORMAL LOW (ref 150–400)
Platelets: 149 10*3/uL — ABNORMAL LOW (ref 150–400)
Platelets: 168 10*3/uL (ref 150–400)
Platelets: 96 10*3/uL — ABNORMAL LOW (ref 150–400)
RBC: 1.96 MIL/uL — ABNORMAL LOW (ref 4.22–5.81)
RBC: 2.29 MIL/uL — ABNORMAL LOW (ref 4.22–5.81)
RBC: 2.56 MIL/uL — ABNORMAL LOW (ref 4.22–5.81)
RBC: 2.57 MIL/uL — ABNORMAL LOW (ref 4.22–5.81)
RBC: 2.73 MIL/uL — ABNORMAL LOW (ref 4.22–5.81)
RBC: 2.76 MIL/uL — ABNORMAL LOW (ref 4.22–5.81)
RBC: 2.86 MIL/uL — ABNORMAL LOW (ref 4.22–5.81)
RBC: 3.11 MIL/uL — ABNORMAL LOW (ref 4.22–5.81)
RDW: 13.8 % (ref 11.5–15.5)
RDW: 15.1 % (ref 11.5–15.5)
RDW: 15.1 % (ref 11.5–15.5)
RDW: 15.2 % (ref 11.5–15.5)
RDW: 15.2 % (ref 11.5–15.5)
RDW: 15.3 % (ref 11.5–15.5)
RDW: 15.4 % (ref 11.5–15.5)
RDW: 15.7 % — ABNORMAL HIGH (ref 11.5–15.5)
WBC: 5.5 10*3/uL (ref 4.0–10.5)
WBC: 6 10*3/uL (ref 4.0–10.5)
WBC: 6.9 10*3/uL (ref 4.0–10.5)
WBC: 8 10*3/uL (ref 4.0–10.5)
WBC: 8.3 10*3/uL (ref 4.0–10.5)
WBC: 8.3 10*3/uL (ref 4.0–10.5)
WBC: 8.5 10*3/uL (ref 4.0–10.5)
WBC: 8.5 10*3/uL (ref 4.0–10.5)

## 2011-01-31 LAB — POCT I-STAT 3, ART BLOOD GAS (G3+)
Acid-base deficit: 1 mmol/L (ref 0.0–2.0)
Acid-base deficit: 2 mmol/L (ref 0.0–2.0)
Acid-base deficit: 2 mmol/L (ref 0.0–2.0)
Bicarbonate: 22.3 mEq/L (ref 20.0–24.0)
Bicarbonate: 23.2 mEq/L (ref 20.0–24.0)
Bicarbonate: 23.4 mEq/L (ref 20.0–24.0)
O2 Saturation: 100 %
O2 Saturation: 100 %
O2 Saturation: 81 %
O2 Saturation: 96 %
O2 Saturation: 97 %
O2 Saturation: 99 %
Patient temperature: 34.8
Patient temperature: 36.5
TCO2: 24 mmol/L (ref 0–100)
TCO2: 24 mmol/L (ref 0–100)
TCO2: 25 mmol/L (ref 0–100)
pCO2 arterial: 26.3 mmHg — ABNORMAL LOW (ref 35.0–45.0)
pCO2 arterial: 26.4 mmHg — ABNORMAL LOW (ref 35.0–45.0)
pCO2 arterial: 38 mmHg (ref 35.0–45.0)
pCO2 arterial: 40 mmHg (ref 35.0–45.0)
pH, Arterial: 7.386 (ref 7.350–7.450)
pH, Arterial: 7.51 — ABNORMAL HIGH (ref 7.350–7.450)
pO2, Arterial: 122 mmHg — ABNORMAL HIGH (ref 80.0–100.0)
pO2, Arterial: 183 mmHg — ABNORMAL HIGH (ref 80.0–100.0)
pO2, Arterial: 368 mmHg — ABNORMAL HIGH (ref 80.0–100.0)
pO2, Arterial: 47 mmHg — ABNORMAL LOW (ref 80.0–100.0)
pO2, Arterial: 89 mmHg (ref 80.0–100.0)
pO2, Arterial: 90 mmHg (ref 80.0–100.0)

## 2011-01-31 LAB — PREPARE CRYOPRECIPITATE

## 2011-01-31 LAB — POCT I-STAT, CHEM 8
BUN: 13 mg/dL (ref 6–23)
BUN: 13 mg/dL (ref 6–23)
BUN: 16 mg/dL (ref 6–23)
Calcium, Ion: 0.96 mmol/L — ABNORMAL LOW (ref 1.12–1.32)
Calcium, Ion: 1.13 mmol/L (ref 1.12–1.32)
Chloride: 106 mEq/L (ref 96–112)
Creatinine, Ser: 0.6 mg/dL (ref 0.4–1.5)
Creatinine, Ser: 0.8 mg/dL (ref 0.4–1.5)
Creatinine, Ser: 1.1 mg/dL (ref 0.4–1.5)
Creatinine, Ser: <0.2 mg/dL — ABNORMAL LOW (ref 0.4–1.5)
HCT: 22 % — ABNORMAL LOW (ref 39.0–52.0)
HCT: 26 % — ABNORMAL LOW (ref 39.0–52.0)
Hemoglobin: 7.5 g/dL — CL (ref 13.0–17.0)
Hemoglobin: 8.8 g/dL — ABNORMAL LOW (ref 13.0–17.0)
Potassium: 3.2 mEq/L — ABNORMAL LOW (ref 3.5–5.1)
Potassium: 3.5 mEq/L (ref 3.5–5.1)
Potassium: 4.1 mEq/L (ref 3.5–5.1)
Sodium: 141 mEq/L (ref 135–145)
Sodium: 141 mEq/L (ref 135–145)
Sodium: 141 mEq/L (ref 135–145)
TCO2: 22 mmol/L (ref 0–100)
TCO2: 25 mmol/L (ref 0–100)

## 2011-01-31 LAB — POCT I-STAT 4, (NA,K, GLUC, HGB,HCT)
Glucose, Bld: 113 mg/dL — ABNORMAL HIGH (ref 70–99)
Glucose, Bld: 119 mg/dL — ABNORMAL HIGH (ref 70–99)
Glucose, Bld: 119 mg/dL — ABNORMAL HIGH (ref 70–99)
Glucose, Bld: 126 mg/dL — ABNORMAL HIGH (ref 70–99)
Glucose, Bld: 138 mg/dL — ABNORMAL HIGH (ref 70–99)
Glucose, Bld: 96 mg/dL (ref 70–99)
HCT: 16 % — ABNORMAL LOW (ref 39.0–52.0)
HCT: 20 % — ABNORMAL LOW (ref 39.0–52.0)
HCT: 20 % — ABNORMAL LOW (ref 39.0–52.0)
HCT: 20 % — ABNORMAL LOW (ref 39.0–52.0)
HCT: 21 % — ABNORMAL LOW (ref 39.0–52.0)
HCT: 23 % — ABNORMAL LOW (ref 39.0–52.0)
HCT: 26 % — ABNORMAL LOW (ref 39.0–52.0)
Hemoglobin: 11.6 g/dL — ABNORMAL LOW (ref 13.0–17.0)
Hemoglobin: 6.1 g/dL — CL (ref 13.0–17.0)
Hemoglobin: 6.8 g/dL — CL (ref 13.0–17.0)
Hemoglobin: 6.8 g/dL — CL (ref 13.0–17.0)
Hemoglobin: 7.8 g/dL — CL (ref 13.0–17.0)
Potassium: 3.8 mEq/L (ref 3.5–5.1)
Potassium: 4 mEq/L (ref 3.5–5.1)
Potassium: 4.1 mEq/L (ref 3.5–5.1)
Potassium: 4.6 mEq/L (ref 3.5–5.1)
Sodium: 137 mEq/L (ref 135–145)
Sodium: 138 mEq/L (ref 135–145)
Sodium: 138 mEq/L (ref 135–145)
Sodium: 140 mEq/L (ref 135–145)

## 2011-01-31 LAB — CROSSMATCH
ABO/RH(D): A POS
Antibody Screen: NEGATIVE

## 2011-01-31 LAB — BLOOD GAS, ARTERIAL
Acid-Base Excess: 0.7 mmol/L (ref 0.0–2.0)
Bicarbonate: 24.9 mEq/L — ABNORMAL HIGH (ref 20.0–24.0)
Drawn by: 275531
O2 Saturation: 95.2 %
Patient temperature: 98.6
TCO2: 26.1 mmol/L (ref 0–100)
pCO2 arterial: 40.6 mmHg (ref 35.0–45.0)
pH, Arterial: 7.405 (ref 7.350–7.450)
pO2, Arterial: 72.6 mmHg — ABNORMAL LOW (ref 80.0–100.0)

## 2011-01-31 LAB — BASIC METABOLIC PANEL
BUN: 13 mg/dL (ref 6–23)
BUN: 16 mg/dL (ref 6–23)
BUN: 17 mg/dL (ref 6–23)
BUN: 19 mg/dL (ref 6–23)
CO2: 26 mEq/L (ref 19–32)
CO2: 28 mEq/L (ref 19–32)
CO2: 29 mEq/L (ref 19–32)
CO2: 31 mEq/L (ref 19–32)
Calcium: 7.7 mg/dL — ABNORMAL LOW (ref 8.4–10.5)
Calcium: 7.9 mg/dL — ABNORMAL LOW (ref 8.4–10.5)
Calcium: 8 mg/dL — ABNORMAL LOW (ref 8.4–10.5)
Calcium: 8.6 mg/dL (ref 8.4–10.5)
Chloride: 103 mEq/L (ref 96–112)
Chloride: 105 mEq/L (ref 96–112)
Chloride: 105 mEq/L (ref 96–112)
Chloride: 111 mEq/L (ref 96–112)
Creatinine, Ser: 0.99 mg/dL (ref 0.4–1.5)
Creatinine, Ser: 1.03 mg/dL (ref 0.4–1.5)
Creatinine, Ser: 1.05 mg/dL (ref 0.4–1.5)
Creatinine, Ser: 1.14 mg/dL (ref 0.4–1.5)
GFR calc Af Amer: >60 mL/min (ref >60)
GFR calc Af Amer: >60 mL/min (ref >60)
GFR calc Af Amer: >60 mL/min (ref >60)
GFR calc Af Amer: >60 mL/min (ref >60)
GFR calc non Af Amer: >60 mL/min (ref >60)
GFR calc non Af Amer: >60 mL/min (ref >60)
GFR calc non Af Amer: >60 mL/min (ref >60)
GFR calc non Af Amer: >60 mL/min (ref >60)
Glucose, Bld: 114 mg/dL — ABNORMAL HIGH (ref 70–99)
Glucose, Bld: 120 mg/dL — ABNORMAL HIGH (ref 70–99)
Glucose, Bld: 136 mg/dL — ABNORMAL HIGH (ref 70–99)
Glucose, Bld: 149 mg/dL — ABNORMAL HIGH (ref 70–99)
Potassium: 3.4 mEq/L — ABNORMAL LOW (ref 3.5–5.1)
Potassium: 3.7 mEq/L (ref 3.5–5.1)
Potassium: 3.9 mEq/L (ref 3.5–5.1)
Potassium: 4 mEq/L (ref 3.5–5.1)
Sodium: 138 mEq/L (ref 135–145)
Sodium: 142 mEq/L (ref 135–145)
Sodium: 142 mEq/L (ref 135–145)
Sodium: 142 mEq/L (ref 135–145)

## 2011-01-31 LAB — COMPREHENSIVE METABOLIC PANEL
ALT: 22 U/L (ref 0–53)
AST: 18 U/L (ref 0–37)
Albumin: 3.5 g/dL (ref 3.5–5.2)
Alkaline Phosphatase: 57 U/L (ref 39–117)
BUN: 22 mg/dL (ref 6–23)
CO2: 27 mEq/L (ref 19–32)
Calcium: 9 mg/dL (ref 8.4–10.5)
Chloride: 106 mEq/L (ref 96–112)
Creatinine, Ser: 1.06 mg/dL (ref 0.4–1.5)
GFR calc Af Amer: >60 mL/min (ref >60)
GFR calc non Af Amer: >60 mL/min (ref >60)
Glucose, Bld: 121 mg/dL — ABNORMAL HIGH (ref 70–99)
Potassium: 4 mEq/L (ref 3.5–5.1)
Sodium: 139 mEq/L (ref 135–145)
Total Bilirubin: 0.5 mg/dL (ref 0.3–1.2)
Total Protein: 6.2 g/dL (ref 6.0–8.3)

## 2011-01-31 LAB — CREATININE, SERUM
Creatinine, Ser: 0.8 mg/dL (ref 0.4–1.5)
Creatinine, Ser: 0.91 mg/dL (ref 0.4–1.5)
Creatinine, Ser: 1.16 mg/dL (ref 0.4–1.5)
GFR calc Af Amer: >60 mL/min (ref >60)
GFR calc Af Amer: >60 mL/min (ref >60)
GFR calc Af Amer: >60 mL/min (ref >60)
GFR calc non Af Amer: >60 mL/min (ref >60)
GFR calc non Af Amer: >60 mL/min (ref >60)
GFR calc non Af Amer: >60 mL/min (ref >60)

## 2011-01-31 LAB — GLUCOSE, CAPILLARY
Glucose-Capillary: 102 mg/dL — ABNORMAL HIGH (ref 70–99)
Glucose-Capillary: 103 mg/dL — ABNORMAL HIGH (ref 70–99)
Glucose-Capillary: 110 mg/dL — ABNORMAL HIGH (ref 70–99)
Glucose-Capillary: 115 mg/dL — ABNORMAL HIGH (ref 70–99)
Glucose-Capillary: 115 mg/dL — ABNORMAL HIGH (ref 70–99)
Glucose-Capillary: 116 mg/dL — ABNORMAL HIGH (ref 70–99)
Glucose-Capillary: 116 mg/dL — ABNORMAL HIGH (ref 70–99)
Glucose-Capillary: 119 mg/dL — ABNORMAL HIGH (ref 70–99)
Glucose-Capillary: 119 mg/dL — ABNORMAL HIGH (ref 70–99)
Glucose-Capillary: 123 mg/dL — ABNORMAL HIGH (ref 70–99)
Glucose-Capillary: 124 mg/dL — ABNORMAL HIGH (ref 70–99)
Glucose-Capillary: 126 mg/dL — ABNORMAL HIGH (ref 70–99)
Glucose-Capillary: 135 mg/dL — ABNORMAL HIGH (ref 70–99)
Glucose-Capillary: 152 mg/dL — ABNORMAL HIGH (ref 70–99)
Glucose-Capillary: 167 mg/dL — ABNORMAL HIGH (ref 70–99)
Glucose-Capillary: 94 mg/dL (ref 70–99)
Glucose-Capillary: 95 mg/dL (ref 70–99)

## 2011-01-31 LAB — DIC (DISSEMINATED INTRAVASCULAR COAGULATION)PANEL
D-Dimer, Quant: 0.46 ug/mL-FEU (ref 0.00–0.48)
Fibrinogen: 159 mg/dL — ABNORMAL LOW (ref 204–475)
INR: 1.5 (ref 0.00–1.49)
Platelets: 135 10*3/uL — ABNORMAL LOW (ref 150–400)
Prothrombin Time: 18.4 seconds — ABNORMAL HIGH (ref 11.6–15.2)
Smear Review: NONE SEEN
aPTT: 37 seconds (ref 24–37)

## 2011-01-31 LAB — URINALYSIS, ROUTINE W REFLEX MICROSCOPIC
Bilirubin Urine: NEGATIVE
Glucose, UA: NEGATIVE mg/dL
Hgb urine dipstick: NEGATIVE
Ketones, ur: NEGATIVE mg/dL
Nitrite: NEGATIVE
Protein, ur: NEGATIVE mg/dL
Specific Gravity, Urine: 1.021 (ref 1.005–1.030)
Urobilinogen, UA: 0.2 mg/dL (ref 0.0–1.0)
pH: 5.5 (ref 5.0–8.0)

## 2011-01-31 LAB — PREPARE FRESH FROZEN PLASMA

## 2011-01-31 LAB — APTT
aPTT: 30 seconds (ref 24–37)
aPTT: 37 seconds (ref 24–37)

## 2011-01-31 LAB — PREPARE RBC (CROSSMATCH)

## 2011-01-31 LAB — MAGNESIUM
Magnesium: 1.9 mg/dL (ref 1.5–2.5)
Magnesium: 2.2 mg/dL (ref 1.5–2.5)
Magnesium: 2.3 mg/dL (ref 1.5–2.5)
Magnesium: 2.7 mg/dL — ABNORMAL HIGH (ref 1.5–2.5)

## 2011-01-31 LAB — PROTIME-INR
INR: 1 (ref 0.00–1.49)
INR: 1.7 — ABNORMAL HIGH (ref 0.00–1.49)
Prothrombin Time: 13.4 seconds (ref 11.6–15.2)
Prothrombin Time: 20.3 seconds — ABNORMAL HIGH (ref 11.6–15.2)

## 2011-01-31 LAB — PREPARE PLATELETS

## 2011-01-31 LAB — HEMOGLOBIN A1C
Hgb A1c MFr Bld: 6.4 % — ABNORMAL HIGH (ref 4.6–6.1)
Mean Plasma Glucose: 137 mg/dL

## 2011-01-31 LAB — POCT I-STAT GLUCOSE: Operator id: 3342

## 2011-01-31 LAB — HEMOGLOBIN AND HEMATOCRIT, BLOOD
HCT: 23.7 % — ABNORMAL LOW (ref 39.0–52.0)
Hemoglobin: 8.1 g/dL — ABNORMAL LOW (ref 13.0–17.0)

## 2011-02-12 IMAGING — CR DG CHEST 2V
2 series · 2 of 2 positions shown · non-contrast
Comparison: 03/08/2009

CLINICAL DATA: Chest pain, preoperative assessment for CABG

CHEST - 2 VIEW

[w chest pa]
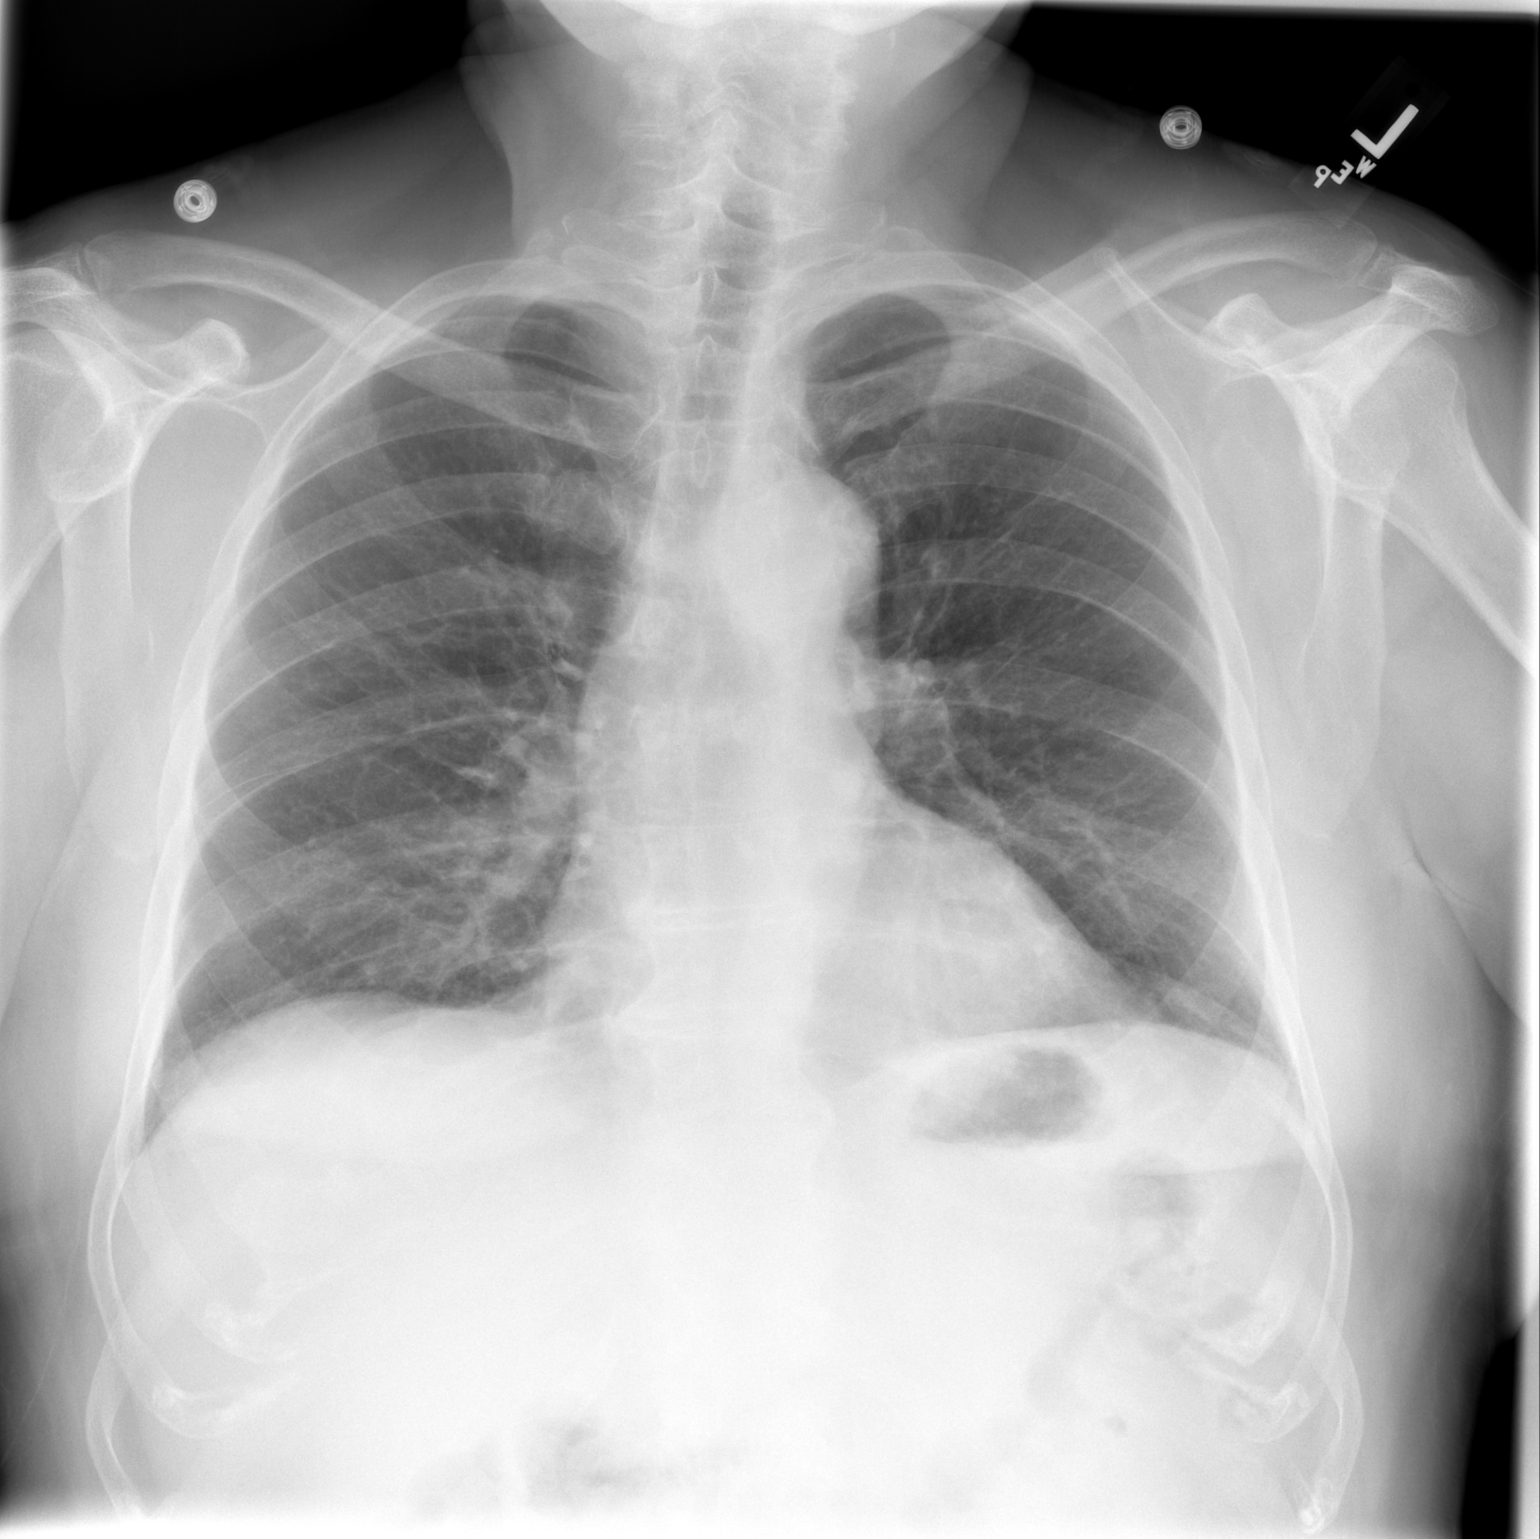

[w chest lat]
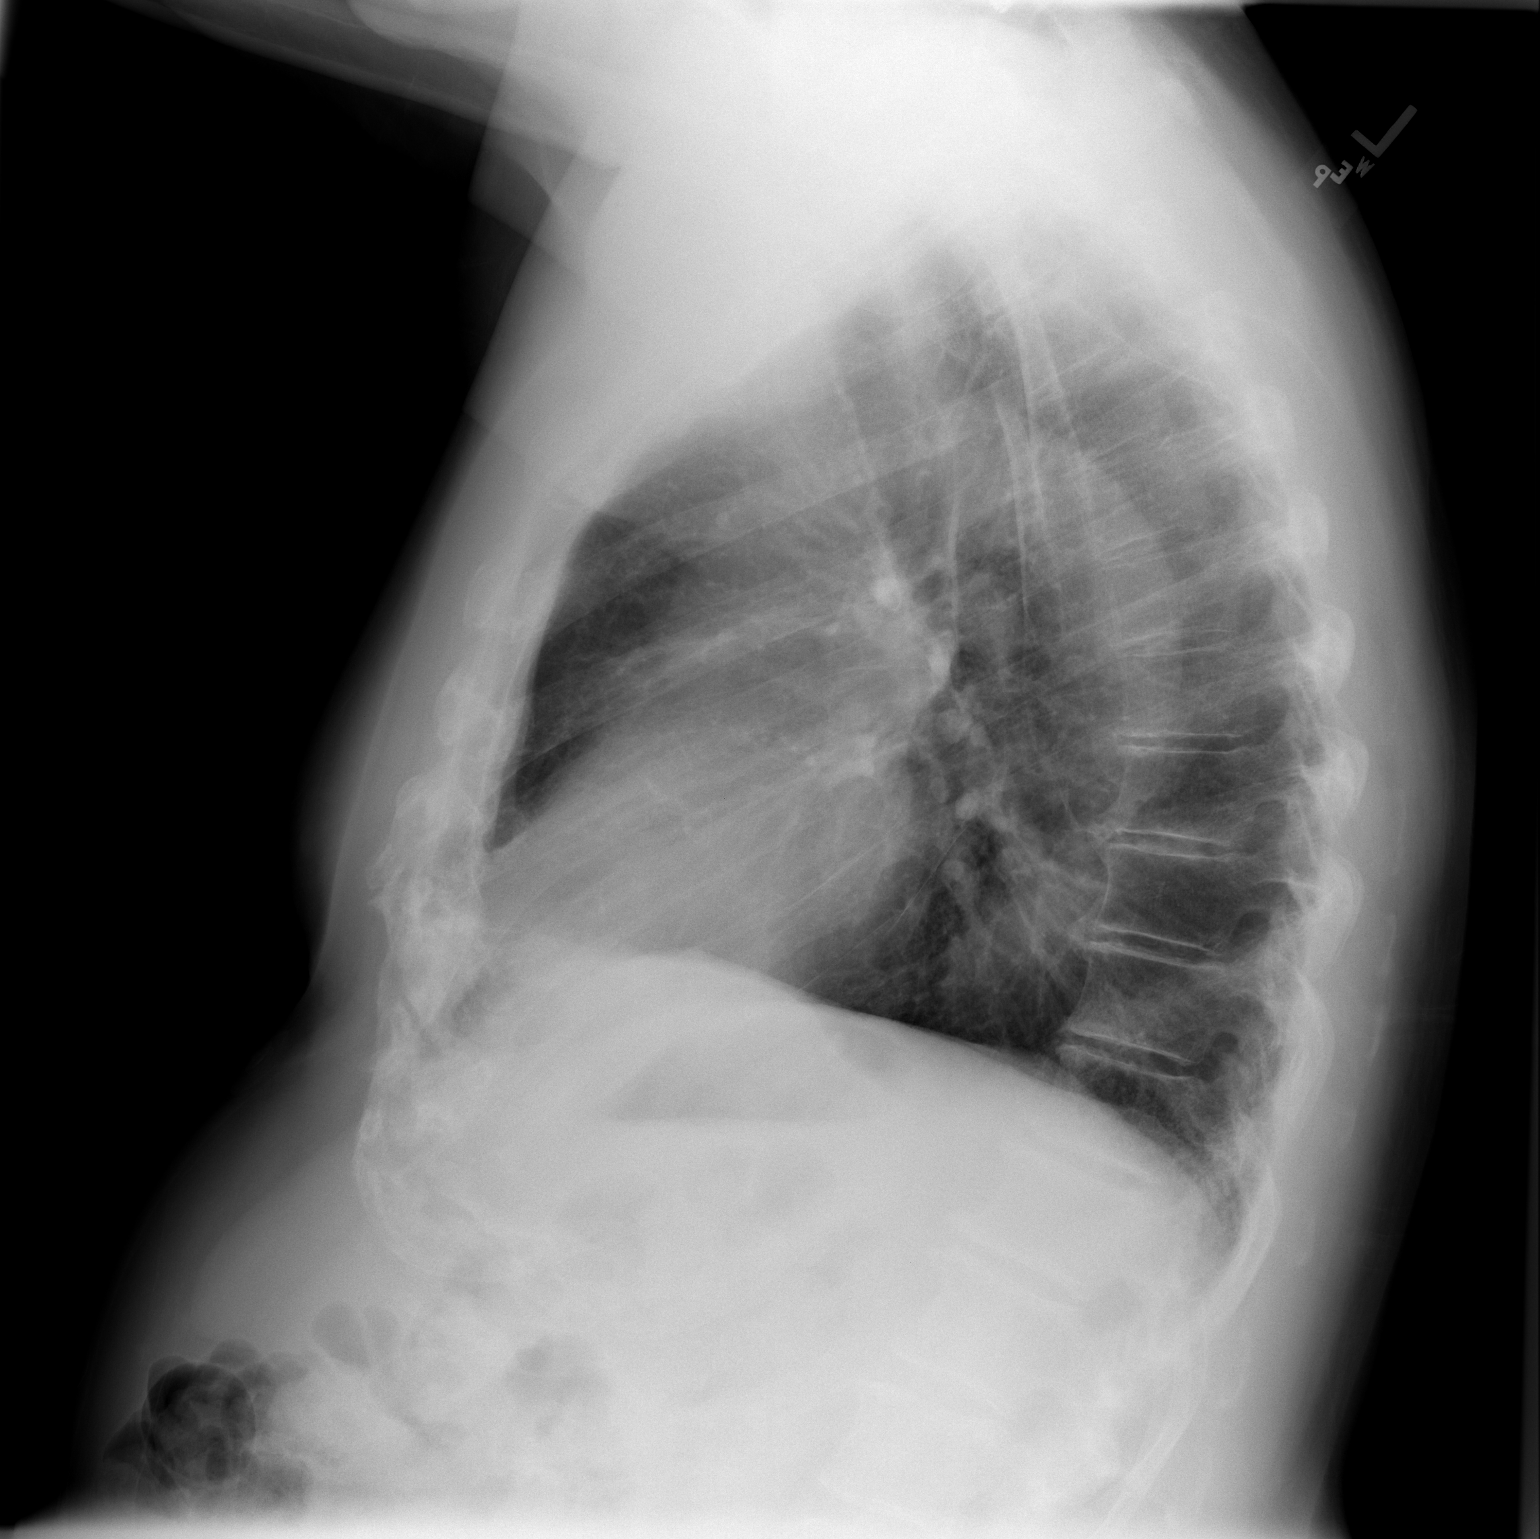

[2 of 2 positions shown; findings below may reference images not displayed]

FINDINGS: Normal heart size and pulmonary vascularity.
Tortuous thoracic aorta.
Minimal chronic peribronchial thickening without infiltrate or
effusion.
No acute bony abnormalities.
IMPRESSION: Chronic bronchitic changes.

## 2011-02-13 IMAGING — CR DG CHEST 1V PORT
1 series · 1 of 1 positions shown · non-contrast
Comparison: 03/16/2009

CLINICAL DATA: Chest pain status post CABG.

PORTABLE CHEST - 1 VIEW

[view not recorded]
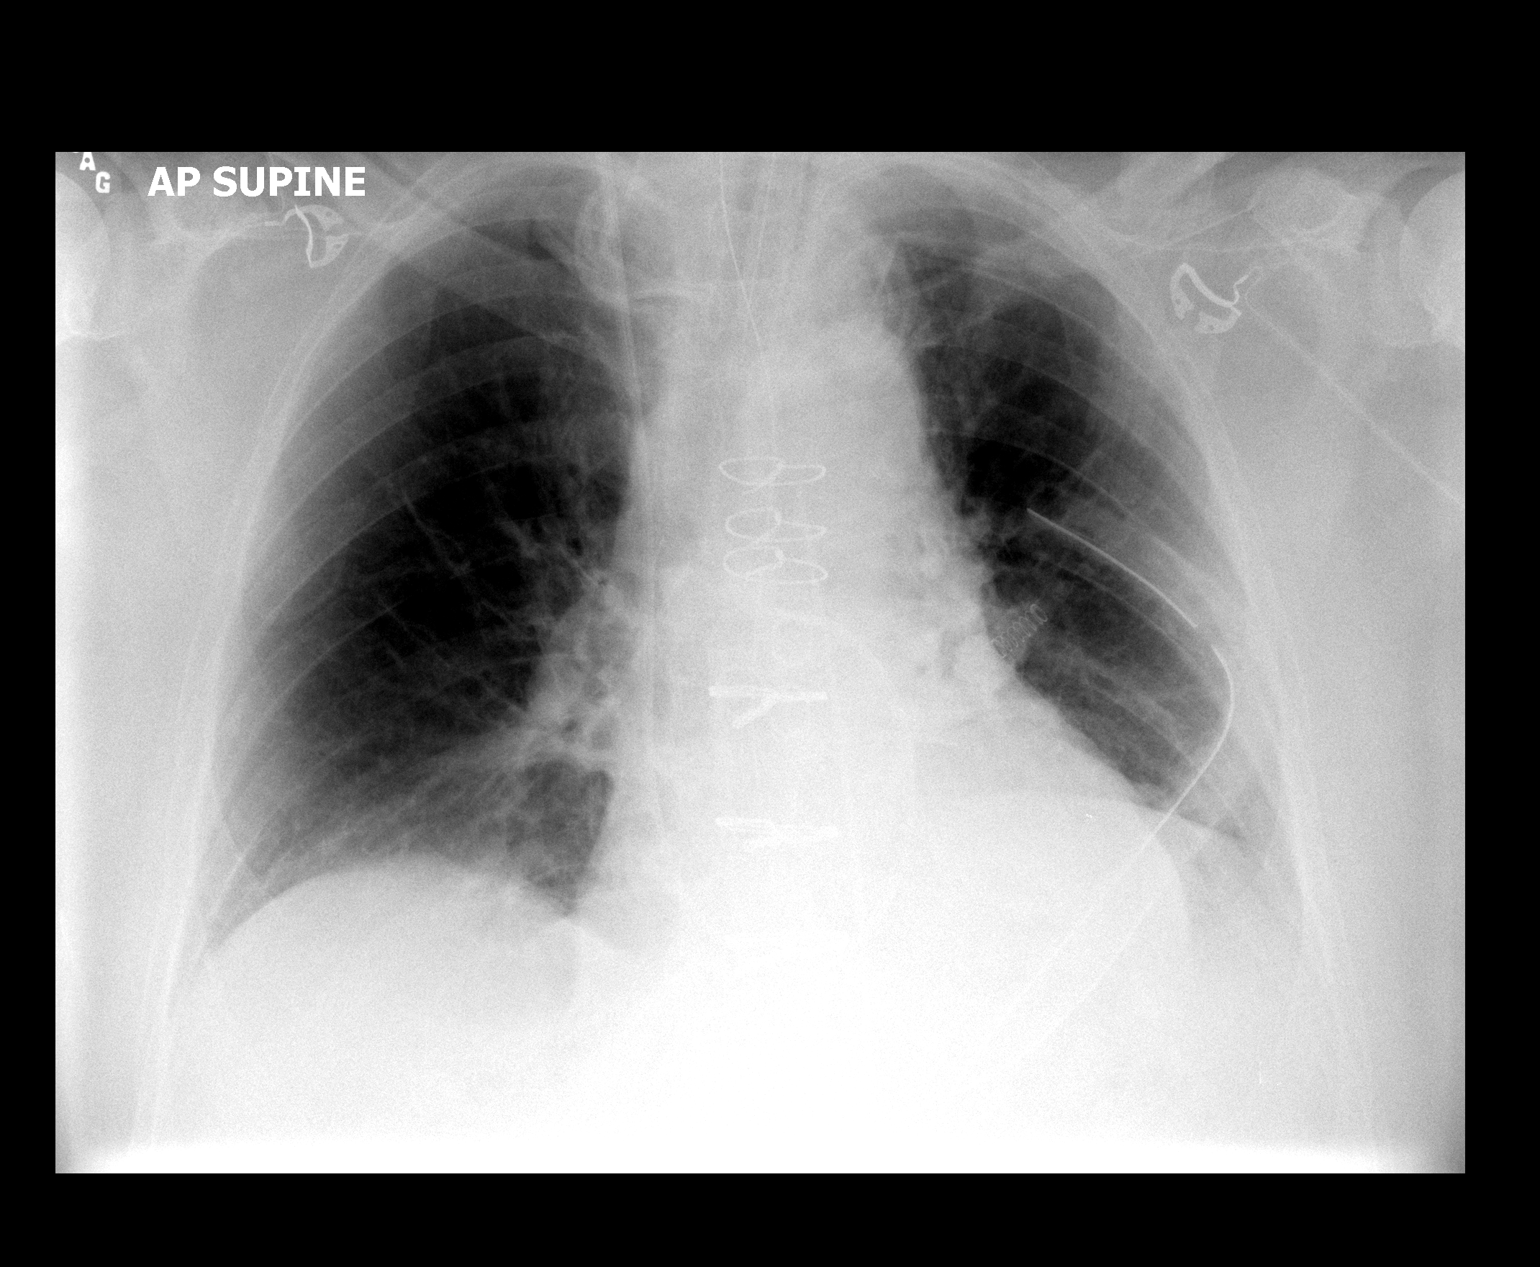

[1 of 1 positions shown; findings below may reference images not displayed]

FINDINGS: Endotracheal tube tip 4.4 cm above the carina.  Swan-Ganz
catheter tip right main pulmonary artery.  Nasogastric tube courses
below the diaphragm.  The tip is not included on this exam. Left
chest tube is in place.  No gross pneumothorax.  Mild elevation
left hemidiaphragm.  Basilar subsegmental atelectatic changes.
Mild central pulmonary vascular prominence.
IMPRESSION: Various support devices appear in appropriate position.

Bibasilar subsegmental atelectasis and mild central pulmonary
vascular prominence.

## 2011-03-07 NOTE — Op Note (Signed)
Andrew [name], Andrew [name]                ACCOUNT NO.:  [id number]   MEDICAL RECORD NO.:  [id number]          PATIENT TYPE:  INP   LOCATION:  2307                         FACILITY:  [facility]   PHYSICIAN:  Kerin Perna, M.D.  DATE OF BIRTH:  1934/05/23   DATE OF PROCEDURE:  03/17/2009  DATE OF DISCHARGE:                               OPERATIVE REPORT   OPERATION:  Mediastinal re-exploration for bleeding.   PREOPERATIVE DIAGNOSIS:  Bleeding following multivessel coronary artery  bypass grafting, coagulopathy.   POSTOPERATIVE DIAGNOSIS:  Bleeding following multivessel coronary artery  bypass grafting, coagulopathy.   SURGEON:  Kerin Perna, MD   ANESTHESIA:  General.   INDICATIONS:  The patient is a 77 year old Caucasian diabetic male who  had undergone multivessel bypass grafting for severe left main and three-  vessel disease.  After returning to the SICU, he was cold and had  significant chest tube output over the first hour.  His initial  coagulation parameters were moderately abnormal with an INR of 1.7 and  platelet count of 92,000.  He was given FFP, platelets, and was placed  under warming blanket.  This helped the bleeding, but did not  sufficiently resolve the issue, and I felt that the best course was to  return the patient to the operating room for re-exploration.  This was  discussed with the patient's family and they agreed.   PROCEDURE:  The patient was brought directly from the SICU to the  operating room.  The previously placed chest tubes were removed and the  entire chest and abdomen were prepped and draped as a sterile field.  The prior sternal incision was opened.  There was a mild-to-moderate  amount of clotted and nonclotted blood in the anterior mediastinum.  This was irrigated with warm saline and removed.  The edges of the  sternum had diffuse oozing and bleeding.  The mediastinal fat also had  diffuse oozing and bleeding.  The sternal retractor was placed  and the  coronary anastomoses were carefully checked and found to be hemostatic.  The grafts were widely patent with good flow.  The cannulation sites in  the aorta and right atrium were all hemostatic.  The vein grafts had no  evidence of bleeding from side branches.  The mammary pedicle was  hemostatic.  There was diffuse oozing from the chest wall and mammary  artery harvest bed.  Topical medical adhesive (FloSeal) was administered  to the edges of the sternum, the chest wall, and areas of the  mediastinal fat were cauterized.  The area was irrigated with warm  antibiotic irrigation.  The pericardium was re-closed and 2 mediastinal  and a left pleural chest tube were placed.  During this time, the  results of the DIC screen returned showing abnormally low fibrinogen and  the patient was administered cryoprecipitate with improved coagulation  function.  I was satisfied that the coagulation was adequate for chest  closure.  Mediastinal wires through the sternum were placed.  The deep  fascia was closed in running #1 Vicryl.  Subcutaneous and skin layers  were closed  in running Vicryl and sterile dressings were applied.  The  patient returned to the ICU in stable condition.      Kerin Perna, M.D.  Electronically Signed     PV/MEDQ  D:  03/17/2009  T:  03/18/2009  Job:  [id number]

## 2011-03-07 NOTE — Discharge Summary (Signed)
NAMERAYMAR, JOINER                ACCOUNT NO.:  [id number]   MEDICAL RECORD NO.:  [id number]          PATIENT TYPE:  INP   LOCATION:                               FACILITY:  [facility]   PHYSICIAN:  Kerin Perna, M.D.  DATE OF BIRTH:  20-Jan-1934   DATE OF ADMISSION:  03/16/2009  DATE OF DISCHARGE:                               DISCHARGE SUMMARY   FINAL DIAGNOSIS:  Unstable angina with severe left main and three-vessel  coronary artery disease.   IN-HOSPITAL DIAGNOSES:  1. Acute blood loss anemia postoperatively.  2. Volume overload postoperatively.  3. Bleeding following multivessel coronary artery bypass grafting,      coagulopathy.   SECONDARY DIAGNOSES:  1. Diabetes mellitus.  2. Dyslipidemia.  3. Status post arthroscopic right knee surgery in 2007.  4. Inguinal hernia repair in 1980.   IN-HOSPITAL OPERATIONS AND PROCEDURES:  1. Cardiac catheterization with left heart catheterization, left      ventriculography, selective right and left coronary arteriography.  2. Coronary artery bypass grafting x4 using a left internal mammary      artery to left anterior descending, saphenous vein graft to      diagonal, saphenous vein graft to circumflex marginal, saphenous      vein graft to posterior descending.  Endoscopic vein harvest to the      right leg done.  3. Mediastinal re-exploration for bleeding.   HISTORY AND PHYSICAL AND HOSPITAL COURSE:  The patient is a 77 year old  Caucasian diabetic nonsmoker who presents with exertional angina and  shortness of breath.  Cardiac catheterization done by Dr. Jacinto Halim  demonstrated severe left main and three-vessel coronary artery disease.  His overall EF was well preserved.  Dr. Donata Clay was consulted  following.  Dr. Donata Clay finally evaluated the patient.  He discussed  with the patient undergoing coronary artery bypass grafting.  He  discussed risks and benefits with the patient.  The patient acknowledged  understanding, agreed to  proceed.  Surgery was scheduled for Mar 17, 2009.  For details of the patient's past medical history and physical  exam, please see dictated H and P.  The patient did remain stable  preoperatively.  He did have preoperative carotid duplex ultrasound done  showing no significant ICA stenosis.  He also had preoperative ABIs done  showing bilaterally to be greater than 1.0.   The patient was taken to the operating room on Mar 17, 2009, where he  underwent coronary artery bypass grafting x4 using a left internal  mammary artery to left anterior descending, saphenous vein graft to  diagonal, saphenous vein graft to circumflex marginal, saphenous vein  graft to posterior descending.  Endoscopic vein harvest of the right leg  was done.  The patient tolerated this procedure and was transferred to  the intensive care unit in stable condition.  Postoperatively while in  the intensive care unit, the patient was noted to be cold and had  significant chest tube output over the first hour.  His initial  coagulation parameters were moderately abnormal with an INR of 1.7 and  platelet  count 92,000.  The patient was given FFP and platelets and was  placed under warming blanket.  This helped the bleeding but did not  significantly resolve the issue.  At that time, Dr. Donata Clay decided to  take the patient back to the operating room for re-exploration.  The  patient was taken back to the operating room, Mar 17, 2009, where he  underwent mediastinal respiration for bleeding.  He tolerated this  procedure well and was transferred back to the intensive care unit in  stable condition.  Post re-exploration, the patient was noted to be  hemodynamically stable.  He remained sedated on vent.  His chest output  had decreased.  Early morning postop day #1, the patient was able to be  weaned and extubated.  Postextubation, the patient noted to be alert and  oriented x4.  Neuro intact.  Postoperatively, the patient's  hemoglobin  and hematocrit was followed closely.  On the morning of surgery, his  hemoglobin was 8.3.  He was started on p.o. iron and folic acid.  This  was followed closely.  His chest tubes had minimal drainage.  Chest x-  ray done showed mild atelectasis but stable.  His chest tubes were left  in until postop day 2 which were then discontinued without difficulty.  The patient's blood pressure and heart rate remained stable.  He  remained in normal sinus rhythm, A pacing.  External pacer was able to  be turned off.  The patient remained in normal sinus rhythm.  All drips  were able to be weaned and discontinued.  Blood pressure stable.  His  hemoglobin and hematocrit 8.2 and 23.4.  One unit of packed red blood  cells was transfused.  The patient was transferred out of 2000 postop  day #2.  Postop day #3, hemoglobin and hematocrit was rechecked and  noted to be 9.2 and 26.5.  The patient did feel much better.  He was  continued on the p.o. iron.  No further blood transfusions were  required.  He did have some volume overload postoperatively.  He was  started on diuretics.  Daily weights were obtained.  This was improving  daily.  Plan to continue the patient on diuretics at time of discharge.  He is still 7.6 kg above his preoperative weight.  The patient remained  in normal sinus rhythm postoperatively.  His blood pressure started to  elevate.  He was started on low-dose ACE inhibitor.  We will continue to  follow during the patient's hospital course.  From pulmonary standpoint,  the patient was able to be weaned and discontinued from oxygen.  He was  sating greater than 90% on room air.  He was using his incentive  spirometer.  The patient was noted to be diabetic, and his ABGs were  followed closely.  He was initially started on Lantus following surgery  and then started back on his metformin and Lantus insulin discontinued.  Blood sugars remained stable.  Postoperatively, the patient  was up  ambulating well with Cardiac Rehab.  He was tolerating diet well.  No  nausea or vomiting noted.  All incisions noted to be clean, dry, and  intact and healing well.   Postop day 4, Mar 21, 2009, the patient's vitals were noted to be  stable.  Most recent lab work showed white blood cell count 8.5,  hemoglobin of 10.0, hematocrit 28.5, platelet count of 145.  Sodium of  142, potassium 3.9, chloride 103, bicarbonate 31,  BUN of 16, creatinine  1.05, glucose of 120.  The patient is felt to be stable and ready for  discharge home in the next 24-48 hours pending he remains stable.   FOLLOWUP APPOINTMENTS:  A followup appointment will be arranged with Dr.  Donata Clay in 3 weeks.  Our office will contact the patient with this  information.  The patient will need to obtain PA and lateral chest x-ray  30 minutes prior to this appointment.  The patient will also need to  follow up with Dr. Jacinto Halim in 2 weeks.  He will need to contact Dr.  Verl Dicker office to make these arrangements.   ACTIVITY:  The patient instructed no driving until released to do so.  No lifting over 10 pounds.  He was told to ambulate 3-4 times per day,  progress as tolerated, and continue his breathing exercises.   INCISIONAL CARE:  The patient is told to shower washing his incisions  using soap and water.  He is to contact the office if he develops any  drainage or opening from any of his incision sites.   DIET:  The patient educated on diet to be low fat, low salt, as well as  diabetic diet.   DISCHARGE MEDICATIONS:  1. Niaspan ER 100 mg daily.  2. Crestor 10 mEq daily.  3. Vitamin D weekly.  4. Piroxicam 20 mg p.r.n.  5. Aspirin 81 mg daily.  6. Metformin 500 mg daily.  7. Lisinopril 2.5 mg daily.  8. Coreg 3.125 mg b.i.d.  9. Nu-Iron 150 mg daily.  10.Folic acid 1 mg daily.  11.Lasix 40 mg daily x7 days.  12.Potassium chloride 20 mEq daily x7 days.  13.Ultram 50 mg 1-2 tablets q.4-6 hours p.r.n.  pain.      Sol Blazing, PA      Kerin Perna, M.D.  Electronically Signed    KMD/MEDQ  D:  03/21/2009  T:  03/22/2009  Job:  [id number]   cc:   Cristy Hilts. Jacinto Halim, MD

## 2011-03-07 NOTE — Op Note (Signed)
Andrew [name]                ACCOUNT NO.:  [id number]   MEDICAL RECORD NO.:  [id number]          PATIENT TYPE:  INP   LOCATION:  2307                         FACILITY:  [facility]   PHYSICIAN:  Kerin Perna, M.D.  DATE OF BIRTH:  09/23/1934   DATE OF PROCEDURE:  03/17/2009  DATE OF DISCHARGE:                               OPERATIVE REPORT   OPERATIONS:  1. Coronary artery bypass grafting x4 (left internal mammary artery to      left anterior descending, saphenous vein graft to diagonal,      saphenous vein graft to circumflex marginal, saphenous vein graft      to posterior descending).  2. Endoscopic harvest of right leg greater saphenous vein.   SURGEON:  Kerin Perna, MD   ASSISTANT:  Rowe Clack, PA-C   PREOPERATIVE DIAGNOSIS:  Unstable angina with severe left main and 3-  vessel coronary artery disease.   POSTOPERATIVE DIAGNOSIS:  Unstable angina with severe left main and 3-  vessel coronary artery disease.   ANESTHESIA:  General.   INDICATIONS:  The patient is a 77 year old Caucasian diabetic nonsmoker  who presents with exertional angina and shortness of breath.  Cardiac  catheterization by Dr. Jacinto Halim demonstrated a 90% stenosis of the left  main with heavy calcification as well as subtotal disease of the LAD,  diagonal; high-grade stenosis of the circumflex, and moderate stenosis  of the proximal-to-mid right coronary artery with heavy calcification  extending into the posterior descending.  His overall EF was well  preserved, and he was felt to be a candidate for surgical  revascularization.   Prior to surgery, I examined the patient in his CCU room and reviewed  the results of cardiac catheterization with the patient and his wife.  I  discussed the indications and expected benefits of coronary artery  bypass surgery for treatment of his coronary artery disease.  I reviewed  the alternatives to surgical therapy as well.  I discussed with the  patient  major issues of surgery including the location of surgical  incisions, the choice of conduit to include internal mammary artery, and  endoscopically harvested saphenous vein, use of general anesthesia, and  cardiopulmonary bypass, and expected postoperative recovery.  I also  reviewed the risks to him of stroke, MI, bleeding, blood transfusion  requirement, infection, and death.  After reviewing these issues, he  demonstrated his understanding and agreed to proceed with the surgery  under what I felt was an informed consent.   OPERATIVE FINDINGS:  Severe calcified diabetic pattern disease with good  LV function.  The vein conduit was a good quality, and the mammary  artery had good flow.  There was a diffuse coagulopathy, and the patient  was treated with platelets before closing the chest.   PROCEDURE:  The patient was brought to the operating room and placed  supine on the operating room table where general anesthesia was induced.  The chest, abdomen, and legs were prepped with Betadine and draped as a  sterile field.  A sternal incision was made as the saphenous vein was  harvested endoscopically from the right leg.  The left internal mammary  artery was harvested as a pedicle graft from its origin at the  subclavian vessels.  The sternal retractor was placed, and the  pericardium was opened and suspended.  Pursestrings were placed in the  ascending aorta and right atrium, and after the vein had been inspected  and found to be adequate, the patient was cannulated and placed on  bypass.  The coronaries were identified for grafting, and the mammary  artery and vein grafts were prepared for the distal anastomoses.  Cardioplegia catheters were placed for both antegrade and retrograde  cardioplegia.  The patient was cooled to 32 degrees and the aortic cross-  clamp was applied.  800 mL of cold blood cardioplegia was delivered in  split doses between the antegrade aortic and retrograde  coronary sinus  catheters.  There was good cardioplegic arrest and septal temperature  dropped less than 12 degrees.  Cardioplegia was delivered every 20  minutes or less while the cross-clamp was in place.   The distal coronary anastomoses were performed.  The first distal  anastomosis was to the posterior descending.  This was a 1.5-mm vessel  with proximal 70-80% stenosis.  Reverse saphenous vein was sewn end-to-  side with running 7-0 Prolene with good flow through the graft.  The  second distal anastomosis was to the circumflex marginal branch of the  left coronary.  It had proximal 90% stenosis.  Reverse saphenous vein  was sewn end-to-side with running 7-0 Prolene with good flow through the  graft.  Cardioplegia was redosed.  The third distal anastomosis was to  the diagonal branch to LAD.  This was a small 1.2-mm vessel with  proximal calcified 80% stenosis.  Reverse saphenous vein was sewn end-to-  side with running 8-0 Prolene, and there was good flow through the  graft.  Cardioplegia was redosed.  The fourth distal anastomosis was to  the distal third of the LAD.  It had proximal 90% stenosis and had  diffuse calcified disease.  The left IMA pedicle was brought through an  opening created in the left lateral pericardium and was brought down  onto the LAD and sewn end-to-side with running 8-0 Prolene.  There was  good flow through the anastomosis after briefly releasing the pedicle  bulldog on the mammary artery.  The bulldog was reapplied, and the  pedicle was secured to the epicardium.  Cardioplegia was redosed.   While the cross-clamp was still in place, 3 proximal vein anastomoses  were performed on the ascending aorta using a 4.0-mm punch with running  7-0 Prolene.  Air was vented from the coronaries with a dose of  retrograde warm blood cardioplegia and the cross-clamp was removed.   The heart resumed a spontaneous rhythm.  The vein grafts were de-aired  with a 27-gauge  needle to aspirate any air bubbles.  There was good flow  through the grafts and hemostasis was documented at the proximal and  distal anastomoses.  Cardioplegia cannulas were removed, and temporary  pacing wires were applied.  The patient was rewarmed to 37 degrees and  the lungs re-expanded and the ventilator was resumed.  The patient was  then weaned from bypass in the usual fashion without requiring inotropes  with stable blood pressure and good cardiac output.  Protamine was  administered without adverse reaction.  There was still considerable  diffuse coagulopathy and a platelet count was 105,000, so we transfused  1 unit  of platelets.  We also administered an extra supplemental dose of  protamine.  Mediastinum was irrigated with warm saline.  The leg  incision was irrigated and closed in a standard fashion.  The  pericardium was closed superiorly.  Two mediastinal and a left pleural  chest tube were placed, brought out through separate incisions.  The  sternum was closed with interrupted steel wire, and the pectoralis  fascia was closed in running #1 Vicryl.  Subcutaneous and skin layers  were closed in running Vicryl and sterile dressings were applied.  Total  bypass time was 132 minutes.      Kerin Perna, M.D.  Electronically Signed     PV/MEDQ  D:  03/17/2009  T:  03/18/2009  Job:  [id number]   cc:   Cristy Hilts. Jacinto Halim, MD

## 2011-03-07 NOTE — Assessment & Plan Note (Signed)
OFFICE VISIT   Mcclard, Selig  DOB:  [birth year]                                        April 30, 2009  CHART #:  [id number]   The patient returns for his final postop check after undergoing  multivessel coronary artery bypass grafting in late May for severe three-  vessel coronary artery disease and unstable angina.  He is doing well  now without recurrent angina.  He is doing some work in his garden and  staying very active.  He is increasing his strength and the surgical  incisions are healing well.   PHYSICAL EXAMINATION:  VITAL SIGNS:  His blood pressure is 100/70, pulse  is 78, respirations 18, and saturation 98%.  LUNGS:  His breath sounds are clear and equal.  CARDIAC:  Rhythm is regular without murmur or rub.  EXTREMITIES:  His leg incisions are healed without edema.   The patient will discontinue his iron tablets when the prescription runs  out.  He will remain on his current medications.  He will return here as  needed.    Kerin Perna, M.D.  Electronically Signed   PV/MEDQ  D:  04/30/2009  T:  05/01/2009  Job:  [id number]

## 2011-03-07 NOTE — Consult Note (Signed)
Andrew [name], Andrew [name]                ACCOUNT NO.:  [id number]   MEDICAL RECORD NO.:  [id number]          PATIENT TYPE:  INP   LOCATION:  2399                         FACILITY:  [facility]   PHYSICIAN:  Kerin Perna, M.D.  DATE OF BIRTH:  1934/07/21   DATE OF CONSULTATION:  03/16/2009  DATE OF DISCHARGE:                                 CONSULTATION   PHYSICIAN REQUESTING CONSULTATION:  Vonna Kotyk R. Jacinto Halim, MD   PRIMARY CARE PHYSICIAN:  Ursula Beath, MD   REASON FOR CONSULTATION:  Severe left main and three-vessel coronary  artery disease.   CHIEF COMPLAINT:  Exertional chest pain.   HISTORY OF PRESENT ILLNESS:  I was asked to evaluate this 77 year old  active Caucasian diabetic male for possible multivessel coronary bypass  grafting for recently diagnosed severe left main and three-vessel  coronary artery disease.  The patient has had exertional chest pain  relieved by rest for the past 4-6 weeks.  He denies any resting pain.  The first episode occurred while he was mowing the lawn and with  heaviness in the left upper part of his chest and left arm and resolved  by rest.  He has had 2-3 other episodes during exertion, relieved by  rest as well.  He denies any orthopnea, PND, nocturnal angina,  associated nausea, or diaphoresis.  There is no prior history of  coronary artery disease, MI, cardiac murmur, or cardiac arrhythmia.  The  patient underwent a stress test which was significantly positive for  ischemia with ST-segment changes and some perfusion abnormalities in the  anterior wall.  He underwent diagnostic cardiac cath today by Dr. Jacinto Halim  which demonstrated a 90% left main stenosis with calcium, 80% mid LAD  stenosis, 80% distal circumflex stenosis, and a 50% stenosis of the  posterior descending branch of the right coronary.  EF was normal.  There was no evidence of valvular insufficiency.  LVEDP was 4 mmHg.  Based on his coronary anatomy and symptoms, he was felt to be a  candidate for surgical revascularization.   PAST MEDICAL HISTORY:  1. Diabetes mellitus.  2. Dyslipidemia.  3. Status post arthroscopic right knee surgery in 2007.  4. Inguinal hernia repair in 1980.   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  He gardens and is very active in his farm and daily life.  He  is a nonsmoker and drinks alcohol occasionally.  He has adult children.   FAMILY HISTORY:  Positive for diabetes.  Negative for coronary artery  disease.   ALLERGIES:  PENICILLIN causes some swelling of his tongue.  It has been  years since he was exposed to PENICILLIN.   MEDICATIONS:  1. Niaspan 1 g daily.  2. Crestor 10 mg daily.  3. Vitamin D.  4. Aspirin 81 mg daily.  5. Piroxicam 20 mg daily for his arthritis.  6. Metformin 500 mg daily.   REVIEW OF SYSTEMS:  Constitutional review is negative for fever or  weight loss.  Pulmonary review is negative for productive cough, history  of abnormal chest x-ray, shortness  of breath, or history of thoracic  trauma.  ENT review is negative for difficulty swallowing.  He has an  upper partial dental plate.  He has no dental complaints.  GI review is  negative for hepatitis, jaundice, or blood per rectum.  Urologic review  is negative for BPH or kidney stone.  Vascular review is negative for  DVT, claudication, or TIA.  Neurologic review is negative for stroke or  seizure.  Endocrine review is positive for diabetes, negative for  thyroid disease.   PHYSICAL EXAMINATION:  VITAL SIGNS:  He is 5 feet 8.5 and weighs 180  pounds.  Blood pressure is 130/70, pulse is 60 and regular.  GENERAL APPEARANCE:  A pleasant, middle-aged Caucasian male in the CCU  step-down following cardiac cath, in no acute distress.  HEENT:  Normocephalic.  Dentition is good.  NECK:  Without JVD, mass, or bruit.  LYMPHATICS:  No palpable supraclavicular or cervical adenopathy.  LUNGS:  Breath sounds are clear.  CARDIAC:   Regular rhythm without murmur, rub, or gallop.  ABDOMEN:  Soft, mildly obese without pulsatile mass.  EXTREMITIES:  No clubbing, cyanosis, or edema.  Peripheral pulses are  strong in all extremities.  He has a Band-Aid dressing over his right  wrist where a radial artery cath was performed and good pulses present  distal to the cath site.  NEUROLOGIC:  Alert and oriented without focal motor deficit.   LABORATORY DATA:  I reviewed the coronary arteriograms with Dr. Jacinto Halim.   IMPRESSION:  Left main three-vessel disease.   PLAN:  The patient will be scheduled for a surgical revascularization of  the LAD, circumflex, and distal RCA for his left main three-vessel  coronary artery disease.  I discussed the details of surgery as well as  benefits and risks, and he understands and agrees to proceed.      Kerin Perna, M.D.  Electronically Signed     PV/MEDQ  D:  03/16/2009  T:  03/17/2009  Job:  [id number]

## 2011-03-07 NOTE — Assessment & Plan Note (Signed)
OFFICE VISIT   Andrew [name], Andrew [name]  DOB:  1934-07-23                                        November 24, 2009  CHART #:  [id number]   The patient is status post coronary artery bypass grafting x4 done by  Dr. Donata Clay on Mar 17, 2009.  He did well postoperatively and was last  seen in the office in July and is noted to be progressing quite well.  Incision is healing.  Vital signs stable.  At that time, I discussed  that he would return as needed.  The patient returns to the office today  with complaints of left-sided chest discomfort with wheezing and  coughing as well as increasing shortness of breath.  The patient states  starting in November, he had a bad cold, wife states possible bronchitis  with a lot of coughing.  Since then, the patient has had discomfort in  his left chest.  He also states that his ambulation has gotten low and  noted shortness of breath with activity he did not have prior to this  cold.  The patient was seen by his primary care physician, Dr. Raquel James,  who did a PA and lateral chest x-ray on October 28, 2009.  She then  referred the patient to be evaluated by Dr. Donata Clay.  The patient  denies any chest pain with activity.  He is sleeping well at night.  He  denies any orthopnea, proximal nocturnal dyspnea, palpitations, or  fevers.  He did have an episode of nausea on Christmas Eve but has not  had any since.  Again, he has noted increasing shortness of breath with  minimal activity.  Dr. Raquel James had started him on Ultram which he  states taking 2 tablets in the morning, seemed to help some.  She also  has increased his metformin to 1000 mg b.i.d.   PHYSICAL EXAMINATION:  Vital Signs:  Blood pressure 98/61, pulse 77,  respirations of 16, and O2 sats 97% on room air.  Respiratory:  Clear to  auscultation bilaterally.  Cardiac:  Regular rate and rhythm.  Chest:  The sternum noted to be stable.  Left chest tender to touch.  Abdomen:  Benign.  Extremities:  No edema noted.  Incisions:  All well healed.   STUDIES:  The patient's PA and lateral chest x-ray per report shows  hypertrophic changes of the lower sternum.  There is no diminished left  upper lobe parenchymal markings, likely emphysematous changes.   IMPRESSION AND PLAN:  The patient was seen and evaluated by Dr. Donata Clay.  It was felt that this left sided chest pain, discomfort with  coughing and sneezing was felt to most likely be muscular.  I felt that  his episodes of coughing in November irritated his scar tissue and has  caused his pain.  He was given prescription for Flexeril 10 mg p.o.  b.i.d. for a total of 2 weeks.  Shortness of breath is concerning, and  we will check an echocardiogram.  We will plan to follow up with the  patient in 2 weeks post echocardiogram.  Our office will contact Dr.  Windell Hummingbird office and schedule an appointment for the patient to see Dr.  Mariah Milling in the next several weeks.  The patient is told in the interim to  continue his activity.  He is to contact us if he has any further  issues.  Otherwise, we will see him 2 weeks post his echocardiogram.  The patient is in agreement.   Kerin Perna, M.D.  Electronically Signed   KMD/MEDQ  D:  11/24/2009  T:  11/25/2009  Job:  [id number]   cc:   Antonieta Iba, MD

## 2011-03-07 NOTE — Assessment & Plan Note (Signed)
OFFICE VISIT   Andrew [name], Andrew [name]  DOB:  1934-10-01                                        December 08, 2009  CHART #:  [id number]   CURRENT PROBLEMS:  1. Status post coronary artery bypass graft x4, May 2010, for unstable      angina with left main and three-vessel disease.  2. Diabetes mellitus.  3. Diffuse arthritis.  4. Recent onset of dyspnea on exertion following a significant upper      respiratory illness in December.   PRESENT ILLNESS:  The patient returns for further evaluation of his  breathing problems, exercise intolerance, and atypical chest pain.  He  states the Flexeril for his chest tightness and soreness has had slight  positive effect.  He is working in his garden and walking his dogs few  hundred feet.  He has noticed of walking up a flight of stairs takes his  breath away.  He denies angina.  He denies orthopnea or PND.  He denies  productive cough or fever.   Since his last visit, he has undergone a 2-D echo which shows fairly  well-preserved global LV function, no pericardial effusion, no evidence  of significant heart valve disease including aortic stenosis or mitral  regurgitation.   MEDICATIONS:  Remain unchanged including aspirin, lisinopril, niacin,  Crestor, metformin, Coreg, Ultram, Plavix, and vitamin D tablets.   PHYSICAL EXAMINATION:  Blood pressure is 99/63, pulse 80, respirations  16, saturation 94%.  He appears to be feeling well.  Breath sounds are  clear.  Cardiac rhythm is regular.  There is no murmur.  The sternum is  stable and well healed.  There is no peripheral edema.  He has no  palpable adenopathy in the neck.   The patient delineates a clear decrease in exercise tolerance following  an episode of upper respiratory infection such as bronchitis or  bronchiolitis.  Although the coughing and congestion have cleared, the  dyspnea on exertion has remained.  The patient's oxygen saturation is  slightly lower this  week, 97% to 98% to 94%.  I have ordered a chest CT  scan with contrast.  This was completed and reported as pending.  However, there is no evidence of pulmonary embolus to me.  There is no  pleural effusion, there is no evidence of pneumonia, there is no  significant pericardial effusion, there appears to be no interstitial  airspace disease.   IMPRESSION AND PLAN:  The patient probably is deconditioned from winter  and activity compounded by slow recovery from his upper respiratory  infection.  He will report to the office next week for followup of the  CT scan formal report and I will recommend that time begin a formal  cardiac rehabilitation at the hospital which he did not enter on surgery  last summer.   Kerin Perna, M.D.  Electronically Signed   PV/MEDQ  D:  12/08/2009  T:  12/09/2009  Job:  [id number]   cc:   Antonieta Iba, MD  Ursula Beath, MD

## 2011-03-07 NOTE — Assessment & Plan Note (Signed)
OFFICE VISIT   Derusha, Eran  DOB:  1934-10-17                                        April 09, 2009  CHART #:  [id number]   CURRENT PROBLEMS:  1. Status post coronary artery bypass graft x4 on Mar 17, 2009, for      unstable angina with severe left main and three-vessel disease.  2. Diabetes mellitus.  3. Postoperative coagulopathy requiring re-exploration for bleeding.   CURRENT MEDICATIONS:  1. Aspirin 81 mg daily.  2. Iron tablets daily.  3. Folic acid daily.  4. Niaspan 1 g daily.  5. Crestor 10 mg daily.  6. Metformin 500 mg q.p.m.  7. Coreg 3.125 mg b.i.d.  8. Ultram p.r.n. pain.  9. Plavix 75 mg daily started by Dr. Jacinto Halim.   HISTORY OF PRESENT ILLNESS:  The patient is a very nice 77 year old  Caucasian male, who returns for his first office visit after multivessel  bypass grafting for severe left main and three-vessel disease with  unstable angina.  At the time of operation, the left IMA was grafted to  his LAD and vein grafts were placed to the diagonal, circumflex  marginal, and posterior descending.  After his coagulopathy had been  treated, he was transferred to the step-down unit and remained in a  sinus rhythm and was diuresed of his perioperative fluid load.  His  chest x-ray remained clear.  He was discharged home on the fifth  postoperative day.  He has had no recurrent angina and the surgical  incisions are healing well.  He is below his preop weight and he still  gets tired easily, but is walking twice daily.   PHYSICAL EXAMINATION:  VITAL SIGNS:  Blood pressure 109/70, pulse 80 and  regular, respirations 18, and saturation 96% on room air.  GENERAL:  He is alert and pleasant.  LUNGS:  Breath sounds are clear and equal.  CHEST:  The sternum is stable and healing well.  CARDIAC:  Rhythm is regular without S3 gallop or rub.  EXTREMITIES:  The leg incisions are healing well.  There is no  peripheral edema.   PA and lateral chest  x-ray today shows clear lung fields and no pleural  effusion.  The sternal wires are well aligned and intact.   PLAN:  The patient was told he could resume driving.  He is scheduled to  start outpatient cardiac rehab in approximately 10 days.  He knows not  to lift more than 20 pounds until 3 months after surgery.  He will  continue taking Ultram for incisional discomfort and will return back  for a final check in 3 weeks, at which time we will discuss increasing  his activity limits.   Kerin Perna, M.D.  Electronically Signed   PV/MEDQ  D:  04/09/2009  T:  04/10/2009  Job:  [id number]   cc:   Cristy Hilts. Jacinto Halim, MD  [facility]  Ursula Beath, MD

## 2011-03-07 NOTE — Assessment & Plan Note (Signed)
OFFICE VISIT   [name], Andrew  DOB:  16-Oct-1934                                        December 15, 2009  CHART #:  [id number]   CURRENT PROBLEMS:  1. Status post coronary artery bypass graft x4 May 2010 for unstable      angina and left main and three-vessel disease.  2. Diabetes.  3. Diffuse arthritis.  4. Severe respiratory illness in November 2010 with severe cough and      subsequent chest wall tenderness and decreased exercise tolerance.   PRESENT ILLNESS:  The patient returns for further discussion of his  exercise intolerance after an initial good recovery following bypass  surgery.  This was of fairly sudden onset after a severe episode of  bronchiolitis with coughing and congestion.  He presented here  complaining of exercise intolerance and he subsequently had a 2-D echo,  which showed normal LV function without valvular disease or pericardial  effusion.  I performed a CT scan, which showed no evidence of pulmonary  emboli, no pleural effusion or significant parenchymal lung disease.  He  is a nonsmoker.  He had pulmonary function test - spirometry, which  shows his FEV-1 to be 3.1 with an FVC of 3.6, both normal.  He states  his shortness of breath with exercise has been slowly improving with  warm weather and he is able to walk his dogs further.  He still has some  soreness over his left anterior chest, which is tender from the  prolonged coughing he had earlier this year.  This was not his anginal  pain.   PHYSICAL EXAMINATION:  Blood pressure 109/70, pulse 72, respirations 18,  and saturation 98%.  Breath sounds are clear.  Cardiac rhythm is  regular.  The sternum is stable and well healed.  He has some slight  tenderness off to the left to the midsternum.  There is no fluctuance or  instability.  Peripheral pulses are intact.  There is no edema.   IMPRESSION AND PLAN:  The patient is improved with his dyspnea on  exertion.  I feel this  is mainly due to his deconditioning as he never  went to the formal rehab program.  There is no evidence of significant  cardiac or pulmonary disease on the preceding workup and I have just let  with the patient to stay on an active walking schedule at least 20  minutes 4 days a week and to give this problem some time.  I would not  recommend inhalers or steroids or antibiotics at this time.  If he  continues, he may need be seen by a pulmonary medicine consultant.   Kerin Perna, M.D.  Electronically Signed   PV/MEDQ  D:  12/15/2009  T:  12/16/2009  Job:  [id number]   cc:   Antonieta Iba, MD  Ursula Beath, MD

## 2011-03-07 NOTE — Cardiovascular Report (Signed)
NAMEIMRE, VECCHIONE                ACCOUNT NO.:  [id number]   MEDICAL RECORD NO.:  [id number]          PATIENT TYPE:  OIB   LOCATION:  2899                         FACILITY:  [facility]   PHYSICIAN:  Cristy Hilts. Jacinto Halim, MD       DATE OF BIRTH:  [birth year]   DATE OF PROCEDURE:  03/16/2009  DATE OF DISCHARGE:                            CARDIAC CATHETERIZATION   PROCEDURES PERFORMED:  Left heart catheterization including:  1. Left ventriculography.  2. Selective right and left coronary arteriography.   INDICATIONS:  Mr. Delph is a 74-year gentleman who has been complaining  of exertional chest discomfort.  He had undergone a stress test that had  markedly positive EKG abnormality without any perfusion abnormalities.  He had experienced chest pain.  Given markedly abnormal stress test, a  multivessel coronary artery disease was suspected.  Hence, he was  brought to the cardiac catheterization lab to evaluate his coronary  anatomy.   HEMODYNAMIC DATA:  The left ventricular pressure was 101/0 with end-  diastolic pressure of 4 mmHg.  Aortic pressure was 104/65 with a mean of  82 mmHg.  There was no significant gradient across the aortic valve.   ANGIOGRAPHIC DATA:  Left ventricle:  Left ventricular systolic function  was normal with an ejection fraction of 60%.   Right coronary artery:  Right coronary artery has a superior anterior  origin.  It is severely ectatic in the proximal to mid segment.  Distally, it is smooth with mild luminal irregularity giving origin to a  moderate-sized PDA and PLA.  This a dominant right coronary artery.   Left main coronary artery:  Left main coronary artery has an ostial 40%-  50% stenoses followed by severe ectasia followed by a distal left main  of 80% stenoses which is eccentric and probably calcified.   Circumflex artery:  The circumflex artery is a moderate-to-large caliber  vessel.  Gives origin to two tiny obtuse marginals.  Also mild ectasia  is noted  at the origin of the obtuse marginals followed by tandem 60%  and 80% stenoses.   LAD:  LAD is a large-caliber vessel.  It has a mild diffuse luminal  irregularity throughout the proximal and mid segment.  There is mild  ectasia noted in the proximal segment.  It gives origin to three  moderate-to-large sized diagonals.  The mid-to-distal LAD is smooth and  normal.   RECOMMENDATIONS:  The patient has triple-vessel coronary artery disease  and also severe ectasia of the left main and right coronary artery.  He  will need coronary artery bypass grafting as a inpatient procedure given  the high-grade nature of the left main stenoses and triple-vessel  disease.   TECHNIQUE OF PROCEDURE:  Under usual sterile precautions using a 5-  French right radial arterial access, a 5-French Tiger catheter was  utilized to engage the left main and right coronary artery and  angiography was performed.  The axis of the ascending aorta through the  right radial artery access was little difficult.  We had to use a  Glidewire to cross the innominate artery  junction.  However, after this,  the procedure was easily accomplished.   Using a exchange length 1-cm J-tipped Rosen wire, we maintained the  position of the ascending aorta and the catheter was exchanged to a 5-  French angled pigtail catheter and left ventriculography was performed  in the RAO projection.  The catheter was then pulled out of the body.   The right radial arterial access was then removed and a radial band was  applied to obtain hemostasis.  The patient tolerated the procedure well.  No immediate complications were noted.   Prior to the procedure and during catheter exchanges, a total of 5 mg of  verapamil, 400 mcg of nitroglycerin, and 1% 2 mL of lidocaine was  administered intra-arterially through the side-arm sheath to prevent  spasm of the radial artery.  3000 units of intravenous heparin was  administered through the intravenous  route prior to the procedure.  Immediately, there was no complication noted.      Cristy Hilts. Jacinto Halim, MD  Electronically Signed     JRG/MEDQ  D:  03/16/2009  T:  03/17/2009  Job:  [id number]   cc:   Ursula Beath, MD

## 2011-03-10 NOTE — Procedures (Signed)
[place]  Patient:    Andrew [name], Andrew [name] Visit Number: [id number] MRN: [id number]          Service Type: END Location: ENDO Attending Physician:  Orland Mustard Dictated by:   Llana Aliment. Randa Evens, M.D. Proc. Date: 12/10/01 Admit Date:  12/10/2001   CC:         Talmadge Coventry, M.D.   Procedure Report  PROCEDURE PERFORMED:  Colonoscopy and coagulation of polyps.  ENDOSCOPIST:  Llana Aliment. Randa Evens, M.D.  MEDICATIONS USED:  Fentanyl 60 mcg, Versed 6 mg IV.  INSTRUMENT:  Adult Olympus video colonoscope.  INDICATIONS:  The patient has had a previous history of adenomatous polyps removed.  This is done as a follow-up procedure.  DESCRIPTION OF PROCEDURE:  The procedure had been explained to the patient and consent obtained.  With the patient in the left lateral decubitus position, the Olympus adult video colonoscope was inserted and advanced under direct visualization.  The patient had scattered diverticula in the sigmoid colon. We were able to advance to the cecum.  The ileocecal valve and appendiceal orifice were seen.  The scope was withdrawn.  The cecum, ascending colon, hepatic flexure, transverse colon, splenic flexure, descending and sigmoid colon were seen well upon withdrawal.  There were two small polyps seen in the descending colon.  Each was 3 to 4 mm and both were cauterized and placed in a single jar.  No other polyps were seen throughout the descending or sigmoid colon.  Again a few scattered diverticula in the sigmoid colon.  Scope withdrawn down to the rectum and no polyps were seen in the rectum on the forward and retroflex view.  Small internal hemorrhoids were seen.  The scope was withdrawn and the patient tolerated the procedure well.  ASSESSMENT:  Descending colon polyps cauterized.  PLAN: Will recommend checking path and three year repeat colonoscopy will be recommended. Dictated by:   Llana Aliment. Randa Evens, M.D. Attending  Physician:  Orland Mustard DD:  12/10/01 TD:  12/10/01 Job: [id number]

## 2011-03-10 NOTE — Op Note (Signed)
   [name], [name]                            ACCOUNT NO.:  [id number]   MEDICAL RECORD NO.:  [id number]                   PATIENT TYPE:  AMB   LOCATION:  DSC                                  FACILITY:  [facility]   PHYSICIAN:  Loreta Ave, M.D.              DATE OF BIRTH:  03/13/1934   DATE OF PROCEDURE:  03/26/2003  DATE OF DISCHARGE:                                 OPERATIVE REPORT   PREOPERATIVE DIAGNOSIS:  Right carpal tunnel syndrome.   POSTOPERATIVE DIAGNOSIS:  Right carpal tunnel syndrome.   PROCEDURE:  Right carpal tunnel release.   SURGEON:  Loreta Ave, M.D.   ASSISTANT:  Arlys John D. Petrarca, P.A.-C.   ANESTHESIA:  IV regional.   SPECIMENS:  None.   CULTURES:  None.   COMPLICATIONS:  None.   DRESSINGS:  Soft compressive with a bulky hand dressing and splint.   PROCEDURE IN DETAIL:  The patient was brought to the operating room and  placed on the operating table in supine position.  After adequate anesthesia  had been obtained the right arm was prepped and draped in the usual sterile  fashion.  A small curved incision over the carpal tunnel avoiding the  superficial branch of the median nerve to the palm slight ulnar ward  extension at the distal wrist crease of the incision.  Skin and subcutaneous  tissues divided.  Retinaculum incised under direct visualization from the  forearm fascia proximally to the palmar arch distally.  Marked constriction  of the nerve throughout the canal with erythema and thickened epineurium.  Complete release of the retinaculum.  The nerve was then further treated  with epineurotomy and further decompression.  The digital branch and motor  branch was identified and decompressed throughout.  The wound was irrigated.  Marked improvement of the nerve after decompression.  Skin closed with  nylon.  Margins of the wound were injected with Marcaine.  Sterile  compressive dressing applied with bulky hand dressing and splint.  Anesthesia reversed.  Brought to the recovery room.  Tolerated surgery well.  No complications.                                               Loreta Ave, M.D.    DFM/MEDQ  D:  03/26/2003  T:  03/26/2003  Job:  [id number]

## 2011-03-10 NOTE — Procedures (Signed)
[place]. [facility]  Patient:    Andrew [name], Andrew [name] Visit Number: [id number] MRN: [id number]          Service Type: END Location: ENDO Attending Physician:  Orland Mustard Dictated by:   Llana Aliment. Randa Evens, M.D. Proc. Date: 12/10/01 Admit Date:  12/10/2001   CC:         Talmadge Coventry, M.D.   Procedure Report  PROCEDURE PERFORMED:  Colonoscopy and coagulation of polyps.  ENDOSCOPIST:  Llana Aliment. Randa Evens, M.D.  MEDICATIONS USED:  Fentanyl 60 mcg, Versed 6 mg IV.  INSTRUMENT:  Adult Olympus video colonoscope.  INDICATIONS:  The patient has had a previous history of adenomatous polyps removed.  This was done as a follow-up procedure.  DESCRIPTION OF PROCEDURE:  The procedure had been explained to the patient and consent obtained.  With the patient in the left lateral decubitus position, the adult Olympus video colonoscope was inserted and advanced under direct visualization.  The patient had scattered diverticula in the sigmoid colon. We were able to advance to the cecum.  The ileocecal valve and appendiceal orifice were seen.  The scope was withdrawn.  The cecum, ascending colon, hepatic flexure, transverse colon, splenic flexure, descending and sigmoid colon were seen well upon withdrawal.  There were two small polyps seen in the descending colon.  Each was 3 to 4 mm and both were cauterized and placed in a single jar.  No other polyps were seen throughout the descending or sigmoid colon.  Again, a few scattered diverticula in the sigmoid colon.  Scope withdrawn down to the rectum and no polyps were seen in the rectum on the forward or retroflex view.  Small internal hemorrhoids were seen.  The scope was withdrawn.  The patient tolerated the procedure well.  ASSESSMENT:  Descending colon polyps cauterized.  PLAN:  Will recommend checking pathology and a three-year repeat colonoscopy will be recommended. Dictated by:   Llana Aliment. Randa Evens,  M.D. Attending Physician:  Orland Mustard DD:  12/10/01 TD:  12/10/01 Job: [id number]

## 2011-03-10 NOTE — Op Note (Signed)
NAMENESTER, BACHUS                            ACCOUNT NO.:  [id number]   MEDICAL RECORD NO.:  [id number]                   PATIENT TYPE:  AMB   LOCATION:  DSC                                  FACILITY:  [facility]   PHYSICIAN:  Loreta Ave, M.D.              DATE OF BIRTH:  [birth year]   DATE OF PROCEDURE:  09/23/2003  DATE OF DISCHARGE:                                 OPERATIVE REPORT   PREOPERATIVE DIAGNOSES:  Triggering A1 pulley right thumb.   POSTOPERATIVE DIAGNOSES:  Triggering A1 pulley right thumb.   OPERATION PERFORMED:  Release A1 pulley right thumb.   SURGEON:  Loreta Ave, M.D.   ASSISTANT:  Arlys John D. Petrarca, P.A.-C.   ANESTHESIA:  Intravenous regional.   SPECIMENS:  None.   CULTURES:  None.   COMPLICATIONS:  None.   DRESSING:  Soft compressive with bulky dressing and thumb spica splint.   DESCRIPTION OF PROCEDURE:  The patient was brought to the operating room and  placed on the operating table in supine position.  After adequate anesthesia  had been obtained, the right arm was prepped and draped in the usual sterile  fashion.  Transverse incision over the A1 pulley, right thumb.  Skin and  subcutaneous tissue divided.  Neurovascular bundles identified, protected  and retracted.  A1 pulley released in its entirety under direct  visualization obliterating all triggering of the flexor tendon.  Some  attrition on the tendon but for the most part longitudinally intact  structurally.  After confirmation of obliteration of all triggering, the  wound was irrigated.  Skin closed with nylon.  Margins of the wound were  injected with Marcaine without epinephrine.  Sterile compressive dressing  applied.  Anesthesia reversed.  Brought to recovery room.  Tolerated surgery  well without complication.                                               Loreta Ave, M.D.    DFM/MEDQ  D:  09/23/2003  T:  09/23/2003  Job:  [id number]

## 2011-03-21 ENCOUNTER — Telehealth: Payer: Self-pay | Admitting: Cardiovascular Disease

## 2011-03-21 NOTE — Telephone Encounter (Signed)
Pt is having a Lumbar ESI and they need clearance to stop Plavix 7 days prior.  Please fax a note to #[phone].

## 2011-03-22 NOTE — Telephone Encounter (Signed)
OK to send note to stop plavix x 7 days

## 2011-03-22 NOTE — Telephone Encounter (Signed)
Please advise 

## 2011-03-23 NOTE — Telephone Encounter (Signed)
Phone message faxed to office.

## 2011-04-04 ENCOUNTER — Telehealth: Payer: Self-pay | Admitting: Emergency Medicine

## 2011-04-04 NOTE — Telephone Encounter (Signed)
Ok to stop Plavix for epidural injection?

## 2011-04-05 ENCOUNTER — Telehealth: Payer: Self-pay | Admitting: *Deleted

## 2011-04-05 NOTE — Telephone Encounter (Signed)
Jae Dire at [facility] called asking for cardiac clearance for pt to hold Plavix prior to Epidural injection (date not scheduled, waiting on clearance). Pt had bypass in 02/2009. Can I give clearance, thanks.

## 2011-04-05 NOTE — Telephone Encounter (Signed)
For when I call back: [phone]

## 2011-04-05 NOTE — Telephone Encounter (Signed)
Sent note on 5/30: ok Sent another note 6/13: ok

## 2011-04-05 NOTE — Telephone Encounter (Signed)
Ok to stop plavix. Would ask surgeon how many days before. Restart after.

## 2011-04-06 ENCOUNTER — Encounter: Payer: Self-pay | Admitting: *Deleted

## 2011-04-06 NOTE — Telephone Encounter (Signed)
Note sent to Ortho stating pt can hold Plavix for 7 days prior and restart after procedure.

## 2011-04-06 NOTE — Telephone Encounter (Signed)
Noted  

## 2011-04-17 ENCOUNTER — Encounter: Payer: Self-pay | Admitting: Internal Medicine

## 2011-04-18 ENCOUNTER — Ambulatory Visit (INDEPENDENT_AMBULATORY_CARE_PROVIDER_SITE_OTHER): Payer: 59 | Admitting: Internal Medicine

## 2011-04-18 ENCOUNTER — Other Ambulatory Visit: Payer: Self-pay | Admitting: Internal Medicine

## 2011-04-18 ENCOUNTER — Encounter: Payer: Self-pay | Admitting: Internal Medicine

## 2011-04-18 ENCOUNTER — Other Ambulatory Visit (INDEPENDENT_AMBULATORY_CARE_PROVIDER_SITE_OTHER): Payer: 59

## 2011-04-18 VITALS — BP 110/62 | HR 68 | Temp 98.5°F | Ht 68.0 in | Wt 198.8 lb

## 2011-04-18 DIAGNOSIS — E559 Vitamin D deficiency, unspecified: Secondary | ICD-10-CM

## 2011-04-18 DIAGNOSIS — I25810 Atherosclerosis of coronary artery bypass graft(s) without angina pectoris: Secondary | ICD-10-CM

## 2011-04-18 DIAGNOSIS — I10 Essential (primary) hypertension: Secondary | ICD-10-CM

## 2011-04-18 DIAGNOSIS — E785 Hyperlipidemia, unspecified: Secondary | ICD-10-CM

## 2011-04-18 DIAGNOSIS — E119 Type 2 diabetes mellitus without complications: Secondary | ICD-10-CM

## 2011-04-18 DIAGNOSIS — R5381 Other malaise: Secondary | ICD-10-CM

## 2011-04-18 DIAGNOSIS — R5383 Other fatigue: Secondary | ICD-10-CM

## 2011-04-18 LAB — HEPATIC FUNCTION PANEL
Albumin: 4.5 g/dL (ref 3.5–5.2)
Total Protein: 7 g/dL (ref 6.0–8.3)

## 2011-04-18 LAB — LIPID PANEL
Cholesterol: 148 mg/dL (ref 0–200)
HDL: 47.6 mg/dL (ref >39.00)
Total CHOL/HDL Ratio: 3
VLDL: 42.4 mg/dL — ABNORMAL HIGH (ref 0.0–40.0)

## 2011-04-18 LAB — CBC WITH DIFFERENTIAL/PLATELET
Basophils Relative: 0.5 % (ref 0.0–3.0)
Eosinophils Relative: 3.7 % (ref 0.0–5.0)
HCT: 36.5 % — ABNORMAL LOW (ref 39.0–52.0)
Hemoglobin: 12.4 g/dL — ABNORMAL LOW (ref 13.0–17.0)
Lymphs Abs: 2 10*3/uL (ref 0.7–4.0)
MCV: 97 fl (ref 78.0–100.0)
Monocytes Absolute: 0.6 10*3/uL (ref 0.1–1.0)
Neutro Abs: 4 10*3/uL (ref 1.4–7.7)
RBC: 3.77 Mil/uL — ABNORMAL LOW (ref 4.22–5.81)
WBC: 6.9 10*3/uL (ref 4.5–10.5)

## 2011-04-18 LAB — TSH: TSH: 1.07 u[IU]/mL (ref 0.35–5.50)

## 2011-04-18 LAB — BASIC METABOLIC PANEL
Chloride: 111 mEq/L (ref 96–112)
Potassium: 5.4 mEq/L — ABNORMAL HIGH (ref 3.5–5.1)

## 2011-04-18 MED ORDER — ROSUVASTATIN CALCIUM 20 MG PO TABS: 20.0000 mg | ORAL_TABLET | Freq: Every day | ORAL | Status: DC

## 2011-04-18 MED ORDER — METFORMIN HCL 500 MG PO TABS: 1000.0000 mg | ORAL_TABLET | Freq: Every day | ORAL | Status: DC

## 2011-04-18 MED ORDER — NIACIN ER (ANTIHYPERLIPIDEMIC) 1000 MG PO TBCR: 1000.0000 mg | EXTENDED_RELEASE_TABLET | Freq: Every day | ORAL | Status: DC

## 2011-04-18 NOTE — Assessment & Plan Note (Signed)
S/p CABG x 4 03/17/2009 - no anginal symptoms - medical mgmt ongoing Follows with cards annually and prn - LeB The current medical regimen is effective;  continue present plan and medications.

## 2011-04-18 NOTE — Patient Instructions (Addendum)
It was good to see you today. Test(s) ordered today. Your results will be called to you after review (48-72hours after test completion). If any changes need to be made, you will be notified at that time. Will also mail you a copy of the results Medications reviewed, no changes at this time - but will consider if needed after lab review. Then, will refill on medication(s) as discussed today. Please schedule followup in 6 months, call sooner if problems.

## 2011-04-18 NOTE — Assessment & Plan Note (Signed)
On statin - tolerating well - check lipids now, titrate if needed - goal LDL<70 with CAD hx The current medical regimen is effective;  continue present plan and medications.

## 2011-04-18 NOTE — Assessment & Plan Note (Signed)
BP Readings from Last 3 Encounters:  04/18/11 110/62  12/15/10 102/68  11/25/10 122/80   The current medical regimen is effective;  continue present plan and medications.

## 2011-04-18 NOTE — Assessment & Plan Note (Signed)
Check level now

## 2011-04-18 NOTE — Assessment & Plan Note (Signed)
Reports good control - on metformin + ACEI Check a1c now, titrate if needed Lab Results  Component Value Date   HGBA1C 6.7 09/22/2010    The current medical regimen is effective;  continue present plan and medications.

## 2011-04-18 NOTE — Progress Notes (Signed)
  Subjective:    [name], male    DOB: 09/05/1934, 77 y.o.   MRN: [id number]  HPI Here for follow up - reviewed chronic medical issues:  CAD s/p CABG 02/2009 - reports compliance with ongoing medical treatment and no changes in medication dose or frequency. denies adverse side effects related to current therapy.   DM2 -reports compliance with ongoing medical treatment and no changes in medication dose or frequency. denies adverse side effects related to current therapy. infreq checks cgs at home   dyslipidemia - reports compliance with ongoing medical treatment and no changes in medication dose or frequency. denies adverse side effects related to current therapy.   HTN - reports compliance with ongoing medical treatment and no changes in medication dose or frequency. denies adverse side effects related to current therapy.   LBP - follows with ortho for same - s/p series of steroid injections for lumbar disc dz with RLE radiculopathy - back pain but R hip discomfort - ongoing care   Past Medical History  Diagnosis Date  . Coronary artery disease     severe left main and three-vessel  . Colonic polyp   . Diverticulosis of colon   . CAD, ARTERY BYPASS GRAFT 03/17/2009  . DEGENERATIVE JOINT DISEASE   . VITAMIN D DEFICIENCY   . DIVERTICULOSIS, COLON   . HYPERLIPIDEMIA-MIXED   . HYPERTENSION, BENIGN   . Type II or unspecified type diabetes mellitus without mention of complication, not stated as uncontrolled      Review of Systems  Constitutional: Positive for fatigue.  Cardiovascular: Negative for chest pain.  Neurological: Negative for headaches.       Objective:   Physical Exam BP 110/62  Pulse 68  Temp(Src) 98.5 F (36.9 C) (Oral)  Ht 5\' 8"  (1.727 m)  Wt 198 lb 12.8 oz (90.175 kg)  BMI 30.23 kg/m2  SpO2 96% Wt Readings from Last 3 Encounters:  04/18/11 198 lb 12.8 oz (90.175 kg)  12/15/10 196 lb (88.905 kg)  11/25/10 197 lb 8 oz (89.585 kg)     Physical Exam  Constitutional:  oriented to person, place, and time. appears well-developed and well-nourished. No distress.  Neck: Normal range of motion. Neck supple. No JVD present. No thyromegaly present.  Cardiovascular: Normal rate, regular rhythm and normal heart sounds.  No murmur heard. no BLE edema Pulmonary/Chest: Effort normal and breath sounds normal. No respiratory distress. no wheezes.  Neurological: he is alert and oriented to person, place, and time. No cranial nerve deficit. Coordination normal.  Skin: Skin is warm and dry.  No erythema or ulceration.  Psychiatric: he has a normal mood and affect. behavior is normal. Judgment and thought content normal.   Lab Results  Component Value Date   WBC 8.5 03/21/2009   HGB 10.0* 03/21/2009   HCT 28.5* 03/21/2009   PLT 145* 03/21/2009   CHOL 146 09/22/2010   HDL 52 09/22/2010   ALT 19 91/01/7828   AST 18 09/22/2010   NA 142 09/22/2010   K 4.0 09/22/2010   CL 105 09/22/2010   CREATININE 0.82 09/22/2010   BUN 19 09/22/2010   CO2 23 09/22/2010   PSA 0.6 09/22/2010   INR 1.5 03/17/2009   HGBA1C 6.7 09/22/2010        Assessment & Plan:  See problem list. Medications and labs reviewed today.

## 2011-04-19 ENCOUNTER — Other Ambulatory Visit: Payer: Self-pay | Admitting: *Deleted

## 2011-04-19 ENCOUNTER — Other Ambulatory Visit: Payer: Self-pay | Admitting: Internal Medicine

## 2011-04-19 LAB — VITAMIN D 25 HYDROXY (VIT D DEFICIENCY, FRACTURES): Vit D, 25-Hydroxy: 45 ng/mL (ref 30–89)

## 2011-04-19 MED ORDER — METFORMIN HCL 500 MG PO TABS: 1000.0000 mg | ORAL_TABLET | Freq: Every day | ORAL | Status: DC

## 2011-04-19 MED ORDER — ROSUVASTATIN CALCIUM 20 MG PO TABS: 20.0000 mg | ORAL_TABLET | Freq: Every day | ORAL | Status: DC

## 2011-04-19 MED ORDER — METFORMIN HCL ER 500 MG PO TB24: 1000.0000 mg | ORAL_TABLET | Freq: Every day | ORAL | Status: DC

## 2011-04-19 MED ORDER — NIACIN ER (ANTIHYPERLIPIDEMIC) 1000 MG PO TBCR: 1000.0000 mg | EXTENDED_RELEASE_TABLET | Freq: Every day | ORAL | Status: DC

## 2011-04-19 NOTE — Telephone Encounter (Signed)
Called pt concerning labs results. Pt requesting refills on meds. Need to go to [facility]..Marland Kitchen6/27/12@3 :32pm/LMB

## 2011-05-18 ENCOUNTER — Telehealth: Payer: Self-pay | Admitting: Cardiovascular Disease

## 2011-05-18 NOTE — Telephone Encounter (Signed)
Please advise 

## 2011-05-18 NOTE — Telephone Encounter (Signed)
Ok to d/c plavix.  Would restart when done

## 2011-05-18 NOTE — Telephone Encounter (Signed)
Patient needs to be cleared for discontinuing Plavix for 7 days prior to a Facet injection.  This is scheduled for 06/01/11.  Needs a written clearance faxed to [phone] attn: Darl Pikes @ Dr. Romero Belling.

## 2011-05-19 NOTE — Telephone Encounter (Signed)
Notified pt below, and faxed note to Darl Pikes at Dr. Parke Simmers for clearance.

## 2011-06-14 ENCOUNTER — Other Ambulatory Visit: Payer: Self-pay | Admitting: Cardiovascular Disease

## 2011-06-14 NOTE — Telephone Encounter (Signed)
Refill requested for carvedilol and lisinopril.

## 2011-08-15 ENCOUNTER — Telehealth: Payer: Self-pay | Admitting: Cardiovascular Disease

## 2011-08-15 NOTE — Telephone Encounter (Signed)
Pt needs a spinal epidural injection and needs to stop his plavix for 7 days. Please send clearance to fax [phone].

## 2011-08-16 NOTE — Telephone Encounter (Signed)
Per last note 11/2010 he had bypass May 2010. Can pt hold plavix for 7 days or does he need f/u? Please advise.

## 2011-08-17 ENCOUNTER — Encounter: Payer: Self-pay | Admitting: *Deleted

## 2011-08-17 NOTE — Telephone Encounter (Signed)
Clearance letter faxed 

## 2011-08-17 NOTE — Telephone Encounter (Signed)
Ok to stop plavix Restart when ok with neurologist (after shot)

## 2011-10-03 ENCOUNTER — Other Ambulatory Visit (INDEPENDENT_AMBULATORY_CARE_PROVIDER_SITE_OTHER): Payer: 59

## 2011-10-03 ENCOUNTER — Encounter: Payer: Self-pay | Admitting: Internal Medicine

## 2011-10-03 ENCOUNTER — Ambulatory Visit (INDEPENDENT_AMBULATORY_CARE_PROVIDER_SITE_OTHER): Payer: 59 | Admitting: Internal Medicine

## 2011-10-03 DIAGNOSIS — E785 Hyperlipidemia, unspecified: Secondary | ICD-10-CM

## 2011-10-03 DIAGNOSIS — I10 Essential (primary) hypertension: Secondary | ICD-10-CM

## 2011-10-03 DIAGNOSIS — Z79899 Other long term (current) drug therapy: Secondary | ICD-10-CM

## 2011-10-03 DIAGNOSIS — I25810 Atherosclerosis of coronary artery bypass graft(s) without angina pectoris: Secondary | ICD-10-CM

## 2011-10-03 DIAGNOSIS — E119 Type 2 diabetes mellitus without complications: Secondary | ICD-10-CM

## 2011-10-03 LAB — HEPATIC FUNCTION PANEL
ALT: 16 U/L (ref 0–53)
AST: 14 U/L (ref 0–37)
Albumin: 4.3 g/dL (ref 3.5–5.2)

## 2011-10-03 LAB — BASIC METABOLIC PANEL
BUN: 31 mg/dL — ABNORMAL HIGH (ref 6–23)
Chloride: 107 mEq/L (ref 96–112)
Glucose, Bld: 120 mg/dL — ABNORMAL HIGH (ref 70–99)
Potassium: 4.5 mEq/L (ref 3.5–5.1)

## 2011-10-03 LAB — LIPID PANEL: Cholesterol: 137 mg/dL (ref 0–200)

## 2011-10-03 LAB — HEMOGLOBIN A1C: Hgb A1c MFr Bld: 7.1 % — ABNORMAL HIGH (ref 4.6–6.5)

## 2011-10-03 NOTE — Assessment & Plan Note (Signed)
BP Readings from Last 3 Encounters:  10/03/11 102/62  04/18/11 110/62  12/15/10 102/68   The current medical regimen is effective;  continue present plan and medications.

## 2011-10-03 NOTE — Assessment & Plan Note (Signed)
S/p CABG x 4 03/17/2009 - no anginal symptoms - medical mgmt ongoing with Plavix, ASA, statin, ACEI, carvedilol  Follows with cards annually and prn - LeB The current medical regimen is effective;  continue present plan and medications.

## 2011-10-03 NOTE — Assessment & Plan Note (Signed)
Reports good control - on metformin + ACEI Check a1c now, titrate if needed Lab Results  Component Value Date   HGBA1C 7.5* 04/18/2011    The current medical regimen is effective;  continue present plan and medications.

## 2011-10-03 NOTE — Progress Notes (Signed)
  Subjective:    [name], male    DOB: [birth year], 77 y.o.   MRN: [id number]  HPI  Here for follow up - reviewed chronic medical issues:  CAD s/p CABG 02/2009 - reports compliance with ongoing medical treatment and no changes in medication dose or frequency. denies adverse side effects related to current therapy.   DM2 -reports compliance with ongoing medical treatment and no changes in medication dose or frequency. denies adverse side effects related to current therapy. infreq checks cgs at home   dyslipidemia - reports compliance with ongoing medical treatment and no changes in medication dose or frequency. denies adverse side effects related to current therapy.   HTN - reports compliance with ongoing medical treatment and no changes in medication dose or frequency. denies adverse side effects related to current therapy.   LBP - follows with ortho for same - s/p series of steroid injections for lumbar disc dz with RLE radiculopathy - back pain but R hip discomfort - ongoing care   Past Medical History  Diagnosis Date  . Coronary artery disease     severe left main and three-vessel  . Colonic polyp   . Diverticulosis of colon   . CAD, ARTERY BYPASS GRAFT 03/17/2009  . DEGENERATIVE JOINT DISEASE   . VITAMIN D DEFICIENCY   . DIVERTICULOSIS, COLON   . HYPERLIPIDEMIA-MIXED   . HYPERTENSION, BENIGN   . Type II or unspecified type diabetes mellitus without mention of complication, not stated as uncontrolled   . LBP (low back pain)     ESI - [place]     Review of Systems  Constitutional: Positive for fatigue.  Cardiovascular: Negative for chest pain.  Neurological: Negative for headaches.       Objective:   Physical Exam  BP 102/62  Pulse 64  Temp(Src) 97.6 F (36.4 C) (Oral)  Wt 194 lb 6.4 oz (88.179 kg)  SpO2 98% Wt Readings from Last 3 Encounters:  10/03/11 194 lb 6.4 oz (88.179 kg)  04/18/11 198 lb 12.8 oz (90.175 kg)  12/15/10 196 lb (88.905 kg)    Constitutional: He appears well-developed and well-nourished. No distress.  Neck: Normal range of motion. Neck supple. No JVD present. No thyromegaly present.  Cardiovascular: Normal rate, regular rhythm and normal heart sounds.  No murmur heard. no BLE edema Pulmonary/Chest: Effort normal and breath sounds normal. No respiratory distress. no wheezes.  Neurological: he is alert and oriented to person, place, and time. No cranial nerve deficit. Coordination normal.  Psychiatric: he has a normal mood and affect. behavior is normal. Judgment and thought content normal.   Lab Results  Component Value Date   WBC 6.9 04/18/2011   HGB 12.4* 04/18/2011   HCT 36.5* 04/18/2011   PLT 199.0 04/18/2011   CHOL 148 04/18/2011   TRIG 212.0* 04/18/2011   HDL 47.60 04/18/2011   LDLDIRECT 72.8 04/18/2011   ALT 20 04/18/2011   AST 18 04/18/2011   NA 144 04/18/2011   K 5.4* 04/18/2011   CL 111 04/18/2011   CREATININE 1.0 04/18/2011   BUN 29* 04/18/2011   CO2 23 04/18/2011   TSH 1.07 04/18/2011   PSA 0.6 09/22/2010   INR 1.5 03/17/2009   HGBA1C 7.5* 04/18/2011        Assessment & Plan:  See problem list. Medications and labs reviewed today.

## 2011-10-03 NOTE — Assessment & Plan Note (Signed)
On statin - tolerating well - check lipids now, titrate if needed - goal LDL<70 with CAD hx The current medical regimen is effective;  continue present plan and medications.

## 2011-10-03 NOTE — Patient Instructions (Signed)
It was good to see you today. Test(s) ordered today. Your results will be called to you after review (48-72hours after test completion). If any changes need to be made, you will be notified at that time. Will also mail you a copy of the results Medications reviewed, no changes at this time - but will consider if needed after lab review. Then, will refill on medication(s) as discussed today. Please schedule followup in 6 months, call sooner if problems.

## 2011-10-24 HISTORY — PX: LUMBAR EPIDURAL INJECTION: SHX1980

## 2011-11-30 ENCOUNTER — Encounter: Payer: Self-pay | Admitting: Cardiovascular Disease

## 2011-11-30 ENCOUNTER — Ambulatory Visit (INDEPENDENT_AMBULATORY_CARE_PROVIDER_SITE_OTHER): Payer: 59 | Admitting: Cardiovascular Disease

## 2011-11-30 VITALS — BP 102/65 | HR 65 | Ht 68.0 in | Wt 197.8 lb

## 2011-11-30 DIAGNOSIS — E119 Type 2 diabetes mellitus without complications: Secondary | ICD-10-CM

## 2011-11-30 DIAGNOSIS — I10 Essential (primary) hypertension: Secondary | ICD-10-CM

## 2011-11-30 DIAGNOSIS — E785 Hyperlipidemia, unspecified: Secondary | ICD-10-CM

## 2011-11-30 DIAGNOSIS — I25810 Atherosclerosis of coronary artery bypass graft(s) without angina pectoris: Secondary | ICD-10-CM

## 2011-11-30 NOTE — Assessment & Plan Note (Signed)
Blood pressure is well controlled on today's visit. No changes made to the medications. 

## 2011-11-30 NOTE — Progress Notes (Signed)
[name], male    DOB: Jan 09, 1934, 78 y.o.   MRN: [id number]  HPI Comments: Andrew [name] is a very pleasant 78 year old gentleman with history of coronary artery disease, bypass in May 2010, hyperlipidemia, diabetes, hypertension, who presents for Routine evaluation.   overall he has been doing well. He did receive 3 cortisone shots to his back last year and his most recent shot was a great success. He denies any recent chest pain or worsening shortness of breath. He does not do exercise on a regular basis. He does tire easily with stairs with some shortness of breath he was able to ablate on a flat surface without any significant symptoms.   His weight has been stable. Otherwise he feels well.   EKG shows normal sinus rhythm with rate 65 beats per minute, right bundle branch block.      Outpatient Encounter Prescriptions as of 11/30/2011  Medication Sig Dispense Refill  . aspirin 81 MG tablet Take 81 mg by mouth daily.        . carvedilol (COREG) 6.25 MG tablet TAKE ONE-HALF (1/2) TABLET TWICE A DAY  90 tablet  3  . Cholecalciferol (VITAMIN D) 2000 UNITS CAPS Take 1 capsule by mouth daily.        . clopidogrel (PLAVIX) 75 MG tablet Take 75 mg by mouth daily.        Marland Kitchen lisinopril (PRINIVIL,ZESTRIL) 10 MG tablet TAKE ONE-HALF (1/2) TABLET DAILY  45 tablet  3  . metFORMIN (GLUCOPHAGE XR) 500 MG 24 hr tablet Take 2 tablets (1,000 mg total) by mouth daily with breakfast.  180 tablet  3  . niacin (NIASPAN) 1000 MG CR tablet Take 1 tablet (1,000 mg total) by mouth at bedtime.  90 tablet  3  . rosuvastatin (CRESTOR) 20 MG tablet Take 1 tablet (20 mg total) by mouth daily.  90 tablet  3  . traMADol (ULTRAM) 50 MG tablet Take 50 mg by mouth every 6 (six) hours as needed. Maximum dose= 8 tablets per day       . DISCONTD: meloxicam (MOBIC) 15 MG tablet Take 15 mg by mouth daily.           Review of Systems  Constitutional: Negative.   HENT: Negative.   Eyes: Negative.   Respiratory:  Negative.   Cardiovascular: Negative.   Gastrointestinal: Negative.   Musculoskeletal: Negative.   Skin: Negative.   Neurological: Negative.   Hematological: Negative.   Psychiatric/Behavioral: Negative.     BP 102/65  Pulse 65  Ht 5\' 8"  (1.727 m)  Wt 197 lb 12.8 oz (89.721 kg)  BMI 30.08 kg/m2  Physical Exam  Nursing note and vitals reviewed. Constitutional: He is oriented to person, place, and time. He appears well-developed and well-nourished.  HENT:  Head: Normocephalic.  Nose: Nose normal.  Mouth/Throat: Oropharynx is clear and moist.  Eyes: Conjunctivae are normal. Pupils are equal, round, and reactive to light.  Neck: Normal range of motion. Neck supple. No JVD present.  Cardiovascular: Normal rate, regular rhythm, S1 normal, S2 normal, normal heart sounds and intact distal pulses.  Exam reveals no gallop and no friction rub.   No murmur heard. Pulmonary/Chest: Effort normal and breath sounds normal. No respiratory distress. He has no wheezes. He has no rales. He exhibits no tenderness.  Abdominal: Soft. Bowel sounds are normal. He exhibits no distension. There is no tenderness.  Musculoskeletal: Normal range of motion. He exhibits no edema and no tenderness.  Lymphadenopathy:  He has no cervical adenopathy.  Neurological: He is alert and oriented to person, place, and time. Coordination normal.  Skin: Skin is warm and dry. No rash noted. No erythema.  Psychiatric: He has a normal mood and affect. His behavior is normal. Judgment and thought content normal.           Assessment and Plan

## 2011-11-30 NOTE — Assessment & Plan Note (Signed)
Currently with no symptoms of angina. No further workup at this time. Continue current medication regimen. 

## 2011-11-30 NOTE — Assessment & Plan Note (Signed)
Cholesterol is at goal on the current lipid regimen. No changes to the medications were made.  

## 2011-11-30 NOTE — Assessment & Plan Note (Signed)
We have encouraged continued exercise, careful diet management in an effort to lose weight. 

## 2011-11-30 NOTE — Patient Instructions (Signed)
You are doing well. No medication changes were made.  Please call us if you have new issues that need to be addressed before your next appt.  Your physician wants you to follow-up in: 6 months.  You will receive a reminder letter in the mail two months in advance. If you don't receive a letter, please call our office to schedule the follow-up appointment.   

## 2011-12-01 ENCOUNTER — Other Ambulatory Visit: Payer: Self-pay | Admitting: Cardiovascular Disease

## 2011-12-01 NOTE — Telephone Encounter (Signed)
Refill sent for plavix  

## 2012-03-19 ENCOUNTER — Other Ambulatory Visit: Payer: Self-pay | Admitting: Dermatology

## 2012-03-23 ENCOUNTER — Other Ambulatory Visit: Payer: Self-pay | Admitting: Internal Medicine

## 2012-04-02 ENCOUNTER — Ambulatory Visit (INDEPENDENT_AMBULATORY_CARE_PROVIDER_SITE_OTHER): Payer: 59 | Admitting: Internal Medicine

## 2012-04-02 ENCOUNTER — Encounter: Payer: Self-pay | Admitting: Internal Medicine

## 2012-04-02 ENCOUNTER — Other Ambulatory Visit (INDEPENDENT_AMBULATORY_CARE_PROVIDER_SITE_OTHER): Payer: 59

## 2012-04-02 VITALS — BP 102/62 | HR 72 | Temp 98.1°F | Ht 68.0 in | Wt 190.0 lb

## 2012-04-02 DIAGNOSIS — E785 Hyperlipidemia, unspecified: Secondary | ICD-10-CM

## 2012-04-02 DIAGNOSIS — I10 Essential (primary) hypertension: Secondary | ICD-10-CM

## 2012-04-02 DIAGNOSIS — E119 Type 2 diabetes mellitus without complications: Secondary | ICD-10-CM

## 2012-04-02 LAB — HEMOGLOBIN A1C: Hgb A1c MFr Bld: 7.6 % — ABNORMAL HIGH (ref 4.6–6.5)

## 2012-04-02 MED ORDER — METFORMIN HCL ER 500 MG PO TB24: 1000.0000 mg | ORAL_TABLET | Freq: Two times a day (BID) | ORAL | Status: DC

## 2012-04-02 NOTE — Assessment & Plan Note (Signed)
On statin - tolerating well -  goal LDL<70 with CAD hx The current medical regimen is effective;  continue present plan and medications.

## 2012-04-02 NOTE — Progress Notes (Signed)
Subjective:    [name], male    DOB: 10-Apr-1934, 78 y.o.   MRN: [id number]  HPI  Here for follow up - reviewed chronic medical issues:  CAD s/p CABG 02/2009 - reports compliance with ongoing medical treatment and no changes in medication dose or frequency. denies adverse side effects related to current therapy.   DM2 -reports compliance with ongoing medical treatment and no changes in medication dose or frequency. denies adverse side effects related to current therapy. infreq checks cgs at home   dyslipidemia - reports compliance with ongoing medical treatment and no changes in medication dose or frequency. denies adverse side effects related to current therapy.   HTN - reports compliance with ongoing medical treatment and no changes in medication dose or frequency. denies adverse side effects related to current therapy.   LBP - follows with ortho for same - s/p series of steroid injections starting in 2012 and ongoing for lumbar disc dz with RLE radiculopathy - back pain and R hip discomfort - ongoing care   Past Medical History  Diagnosis Date  . Coronary artery disease 02/2009    severe left main and three-vessel, CABG  . Colonic polyp 2011 last colo    colo q 40yr - [facility]   . Diverticulosis of colon   . DEGENERATIVE JOINT DISEASE   . VITAMIN D DEFICIENCY   . DIVERTICULOSIS, COLON   . HYPERLIPIDEMIA-MIXED   . HYPERTENSION, BENIGN   . Type II or unspecified type diabetes mellitus without mention of complication, not stated as uncontrolled   . LBP (low back pain)     ESI - [place]     Review of Systems  Constitutional: Positive for fatigue. Negative for unexpected weight change.  Respiratory: Negative for cough and shortness of breath.   Cardiovascular: Negative for chest pain and leg swelling.  Neurological: Negative for headaches.       Objective:   Physical Exam  BP 102/62  Pulse 72  Temp(Src) 98.1 F (36.7 C) (Oral)  Ht 5\' 8"  (1.727 m)  Wt 190 lb  (86.183 kg)  BMI 28.89 kg/m2  SpO2 98% Wt Readings from Last 3 Encounters:  04/02/12 190 lb (86.183 kg)  11/30/11 197 lb 12.8 oz (89.721 kg)  10/03/11 194 lb 6.4 oz (88.179 kg)   Constitutional: He appears well-developed and well-nourished. No distress.  Neck: Normal range of motion. Neck supple. No JVD present. No thyromegaly present.  Cardiovascular: Normal rate, regular rhythm and normal heart sounds.  No murmur heard. no BLE edema Pulmonary/Chest: Effort normal and breath sounds normal. No respiratory distress. no wheezes.  Neurological: he is alert and oriented to person, place, and time. No cranial nerve deficit. Coordination normal.  Psychiatric: he has a normal mood and affect. behavior is normal. Judgment and thought content normal.   Lab Results  Component Value Date   WBC 6.9 04/18/2011   HGB 12.4* 04/18/2011   HCT 36.5* 04/18/2011   PLT 199.0 04/18/2011   CHOL 137 10/03/2011   TRIG 175.0* 10/03/2011   HDL 48.40 10/03/2011   LDLDIRECT 72.8 04/18/2011   ALT 16 10/03/2011   AST 14 10/03/2011   NA 141 10/03/2011   K 4.5 10/03/2011   CL 107 10/03/2011   CREATININE 1.1 10/03/2011   BUN 31* 10/03/2011   CO2 26 10/03/2011   TSH 1.07 04/18/2011   PSA 0.6 09/22/2010   INR 1.5 03/17/2009   HGBA1C 7.1* 10/03/2011        Assessment & Plan:  See problem list. Medications and labs reviewed today.

## 2012-04-02 NOTE — Assessment & Plan Note (Signed)
BP Readings from Last 3 Encounters:  04/02/12 102/62  11/30/11 102/65  10/03/11 102/62   The current medical regimen is effective;  continue present plan and medications.

## 2012-04-02 NOTE — Patient Instructions (Signed)
It was good to see you today. Test(s) ordered today. Your results will be called to you after review (48-72hours after test completion). If any changes need to be made, you will be notified at that time. Will also mail you a copy of the results Medications reviewed, no changes at this time - but will consider if needed after lab review. Then, will refill on medication(s) as discussed today. Please schedule followup in 6 months for cholesterol, diabetes mellitus and blood pressure check - call sooner if problems.

## 2012-04-02 NOTE — Progress Notes (Signed)
Addended by: Rene Paci A on: 04/02/2012 01:04 PM   Modules accepted: Orders

## 2012-04-02 NOTE — Assessment & Plan Note (Signed)
Reports good control - on metformin + ACEI Check a1c now, titrate if needed Lab Results  Component Value Date   HGBA1C 7.1* 10/03/2011    The current medical regimen is effective;  continue present plan and medications.

## 2012-05-23 ENCOUNTER — Other Ambulatory Visit: Payer: Self-pay

## 2012-05-23 ENCOUNTER — Other Ambulatory Visit: Payer: Self-pay | Admitting: Internal Medicine

## 2012-05-23 MED ORDER — METFORMIN HCL ER 500 MG PO TB24: 1000.0000 mg | ORAL_TABLET | Freq: Two times a day (BID) | ORAL | Status: DC

## 2012-05-28 ENCOUNTER — Other Ambulatory Visit: Payer: Self-pay

## 2012-05-28 ENCOUNTER — Encounter: Payer: Self-pay | Admitting: Cardiovascular Disease

## 2012-05-28 ENCOUNTER — Ambulatory Visit (INDEPENDENT_AMBULATORY_CARE_PROVIDER_SITE_OTHER): Payer: 59 | Admitting: Cardiovascular Disease

## 2012-05-28 VITALS — BP 100/60 | HR 86 | Ht 68.0 in | Wt 186.0 lb

## 2012-05-28 DIAGNOSIS — M791 Myalgia, unspecified site: Secondary | ICD-10-CM | POA: Insufficient documentation

## 2012-05-28 DIAGNOSIS — IMO0001 Reserved for inherently not codable concepts without codable children: Secondary | ICD-10-CM

## 2012-05-28 DIAGNOSIS — I10 Essential (primary) hypertension: Secondary | ICD-10-CM

## 2012-05-28 DIAGNOSIS — E785 Hyperlipidemia, unspecified: Secondary | ICD-10-CM

## 2012-05-28 DIAGNOSIS — I25810 Atherosclerosis of coronary artery bypass graft(s) without angina pectoris: Secondary | ICD-10-CM

## 2012-05-28 MED ORDER — METFORMIN HCL ER 500 MG PO TB24: 1000.0000 mg | ORAL_TABLET | Freq: Two times a day (BID) | ORAL | Status: DC

## 2012-05-28 NOTE — Assessment & Plan Note (Signed)
Recent significant symptoms in his legs. Worse after exertion such as walking which he does not do on a regular basis. I have suggested if symptoms persist, he could hold his Crestor until symptoms resolve.

## 2012-05-28 NOTE — Assessment & Plan Note (Signed)
Cholesterol is at goal on the current lipid regimen. He is having muscle ache and we have suggested he hold the Crestor periodically for worsening symptoms to see if this will help.

## 2012-05-28 NOTE — Progress Notes (Signed)
[name], male    DOB: 09-07-1934, 78 y.o.   MRN: [id number]  HPI Comments: Andrew [name] is a very pleasant 78 year old gentleman with history of coronary artery disease, bypass in May 2010, hyperlipidemia, diabetes, hypertension, who presents for Routine evaluation.  He reports feeling very fatigued. He has been tired since his bypass surgery but his wife reports he has been more fatigued than usual. He is taking naps once or twice a day which is unusual for him. He also reports having significant pain in his legs, calves and hip pain particularly with heavy exertion. He does not exercise on a regular basis. No significant aerobic activity. His wife reports that his blood pressure has been low at home.    He did receive 3 cortisone shots to his back over one year ago  He denies any recent chest pain or worsening shortness of breath.   His weight has been stable. Otherwise he feels well.   EKG shows normal sinus rhythm with rate 85 beats per minute, right bundle branch block.      Outpatient Encounter Prescriptions as of 05/28/2012  Medication Sig Dispense Refill  . aspirin 81 MG tablet Take 81 mg by mouth daily.        . carvedilol (COREG) 6.25 MG tablet TAKE ONE-HALF (1/2) TABLET TWICE A DAY  90 tablet  3  . cholecalciferol (VITAMIN D) 1000 UNITS tablet Take 1,000 Units by mouth daily.      . clopidogrel (PLAVIX) 75 MG tablet TAKE 1 TABLET DAILY  90 tablet  3  . CRESTOR 20 MG tablet TAKE 1 TABLET DAILY  90 tablet  1  . cyclobenzaprine (FLEXERIL) 10 MG tablet Take 10 mg by mouth 3 (three) times daily as needed.      Marland Kitchen lisinopril (PRINIVIL,ZESTRIL) 10 MG tablet TAKE ONE-HALF (1/2) TABLET DAILY  45 tablet  3  . metFORMIN (GLUCOPHAGE) 1000 MG tablet Take four tablets twice a day.      Marland Kitchen NIASPAN 1000 MG CR tablet TAKE 1 TABLET AT BEDTIME  90 tablet  3  . traMADol (ULTRAM) 50 MG tablet Take 50 mg by mouth every 6 (six) hours as needed. Maximum dose= 8 tablets per day          Review of Systems  Constitutional: Positive for fatigue.  HENT: Negative.   Eyes: Negative.   Respiratory: Negative.   Cardiovascular: Negative.   Gastrointestinal: Negative.   Musculoskeletal: Positive for myalgias.  Skin: Negative.   Neurological: Negative.   Hematological: Negative.   Psychiatric/Behavioral: Negative.   All other systems reviewed and are negative.    BP 100/60  Pulse 86  Ht 5\' 8"  (1.727 m)  Wt 186 lb (84.369 kg)  BMI 28.28 kg/m2 Repeat blood pressure by myself bilaterally documented systolic pressures in the 80s No symptoms of lightheadedness or dizziness  Physical Exam  Nursing note and vitals reviewed. Constitutional: He is oriented to person, place, and time. He appears well-developed and well-nourished.  HENT:  Head: Normocephalic.  Nose: Nose normal.  Mouth/Throat: Oropharynx is clear and moist.  Eyes: Conjunctivae are normal. Pupils are equal, round, and reactive to light.  Neck: Normal range of motion. Neck supple. No JVD present.  Cardiovascular: Normal rate, regular rhythm, S1 normal, S2 normal, normal heart sounds and intact distal pulses.  Exam reveals no gallop and no friction rub.   No murmur heard. Pulmonary/Chest: Effort normal and breath sounds normal. No respiratory distress. He has no wheezes. He has no  rales. He exhibits no tenderness.  Abdominal: Soft. Bowel sounds are normal. He exhibits no distension. There is no tenderness.  Musculoskeletal: Normal range of motion. He exhibits no edema and no tenderness.  Lymphadenopathy:    He has no cervical adenopathy.  Neurological: He is alert and oriented to person, place, and time. Coordination normal.  Skin: Skin is warm and dry. No rash noted. No erythema.  Psychiatric: He has a normal mood and affect. His behavior is normal. Judgment and thought content normal.           Assessment and Plan

## 2012-05-28 NOTE — Assessment & Plan Note (Signed)
Currently with no symptoms of angina. No further workup at this time. Continue current medication regimen. 

## 2012-05-28 NOTE — Patient Instructions (Addendum)
Please hold lisinopril Eat salt and lots of fluid Monitor blood pressure For blood pressure less than 100 on the top number on a consistent basis, call the office  Hold crestor as needed for muscle ache or weakness  Please call us if you have new issues that need to be addressed before your next appt.  Your physician wants you to follow-up in: 3 months.  You will receive a reminder letter in the mail two months in advance. If you don't receive a letter, please call our office to schedule the follow-up appointment.

## 2012-05-28 NOTE — Assessment & Plan Note (Signed)
Blood pressure is surprisingly low on today's visit with systolic pressures in the 80s when checked by myself bilaterally. He does not have symptoms of lightheadedness but does have significant fatigue. We will hold his lisinopril for now. If blood pressure at home continues to be low, particularly systolic pressure less than 100, we could hold the carvedilol.

## 2012-06-28 ENCOUNTER — Other Ambulatory Visit: Payer: Self-pay | Admitting: Cardiovascular Disease

## 2012-08-20 ENCOUNTER — Other Ambulatory Visit: Payer: Self-pay | Admitting: Cardiovascular Disease

## 2012-08-28 ENCOUNTER — Ambulatory Visit (INDEPENDENT_AMBULATORY_CARE_PROVIDER_SITE_OTHER): Payer: 59 | Admitting: Cardiovascular Disease

## 2012-08-28 ENCOUNTER — Encounter: Payer: Self-pay | Admitting: Cardiovascular Disease

## 2012-08-28 VITALS — BP 102/60 | HR 65 | Ht 68.0 in | Wt 190.8 lb

## 2012-08-28 DIAGNOSIS — E119 Type 2 diabetes mellitus without complications: Secondary | ICD-10-CM

## 2012-08-28 DIAGNOSIS — M791 Myalgia, unspecified site: Secondary | ICD-10-CM

## 2012-08-28 DIAGNOSIS — I10 Essential (primary) hypertension: Secondary | ICD-10-CM

## 2012-08-28 DIAGNOSIS — IMO0001 Reserved for inherently not codable concepts without codable children: Secondary | ICD-10-CM

## 2012-08-28 DIAGNOSIS — I25810 Atherosclerosis of coronary artery bypass graft(s) without angina pectoris: Secondary | ICD-10-CM

## 2012-08-28 DIAGNOSIS — E785 Hyperlipidemia, unspecified: Secondary | ICD-10-CM

## 2012-08-28 MED ORDER — ATORVASTATIN CALCIUM 20 MG PO TABS: 20.0000 mg | ORAL_TABLET | Freq: Every day | ORAL | Status: DC

## 2012-08-28 NOTE — Patient Instructions (Addendum)
You are doing well. Please start atorvastatin 1/2 pill a day for a few weeks, If no significant side effects, increase to a full pill  Please call us if you have new issues that need to be addressed before your next appt.  Your physician wants you to follow-up in: 6 months.  You will receive a reminder letter in the mail two months in advance. If you don't receive a letter, please call our office to schedule the follow-up appointment.

## 2012-08-28 NOTE — Assessment & Plan Note (Signed)
Currently with no symptoms of angina. No further workup at this time. Continue current medication regimen. 

## 2012-08-28 NOTE — Assessment & Plan Note (Signed)
We have encouraged continued exercise, careful diet management in an effort to lose weight. 

## 2012-08-28 NOTE — Assessment & Plan Note (Signed)
Secondary to Crestor 20 mg daily. Improved symptoms by holding Crestor.

## 2012-08-28 NOTE — Assessment & Plan Note (Signed)
We will start Lipitor 10 mg daily for several weeks before titrating up to 20 mg daily. This can be slowly increased over the next several months as tolerated for goal LDL less than 70.

## 2012-08-28 NOTE — Assessment & Plan Note (Signed)
Improved symptoms of ACE inhibitor.  We'll continue carvedilol

## 2012-08-28 NOTE — Progress Notes (Signed)
[name], male    DOB: 1934-02-26, 78 y.o.   MRN: [id number]  HPI Comments: Mr. Stradling is a very pleasant 78 year old gentleman with history of coronary artery disease, bypass in May 2010, hyperlipidemia, diabetes, hypertension, who presents for Routine evaluation.  On his last clinic visit, blood pressure was low and we held his ACE inhibitor. He had significant myalgias in his lower extremities and we held his Crestor. After both of these changes, he feels well and is very active. His wife reports that he is more like himself with good energy. Systolic pressure has improved from 80 systolic on his last clinic visit up to 100 on today's visit.  He does try to watch his diet but has significant breads and other desserts.  Hemoglobin A1c 7.6.  No significant aerobic activity.   He did receive 3 cortisone shots to his back over one year ago  He denies any recent chest pain or worsening shortness of breath.   His weight has been stable. Otherwise he feels well.   EKG shows normal sinus rhythm with rate 65 beats per minute, right bundle branch block.      Outpatient Encounter Prescriptions as of 08/28/2012  Medication Sig Dispense Refill  . aspirin 81 MG tablet Take 81 mg by mouth daily.        . carvedilol (COREG) 6.25 MG tablet Takes 1/2 tablet twice daily.      . celecoxib (CELEBREX) 200 MG capsule Take 200 mg by mouth 2 (two) times daily.      . cholecalciferol (VITAMIN D) 1000 UNITS tablet Take 1,000 Units by mouth daily.      . clopidogrel (PLAVIX) 75 MG tablet TAKE 1 TABLET DAILY  90 tablet  3  . cyclobenzaprine (FLEXERIL) 10 MG tablet Take 10 mg by mouth as needed.       . metFORMIN (GLUCOPHAGE) 1000 MG tablet Takes 1 tablets am and  pm daily.      Marland Kitchen NIASPAN 1000 MG CR tablet TAKE 1 TABLET AT BEDTIME  90 tablet  3  . traMADol (ULTRAM) 50 MG tablet Take 50 mg by mouth every 6 (six) hours as needed. Maximum dose= 8 tablets per day         Review of Systems  HENT:  Negative.   Eyes: Negative.   Respiratory: Negative.   Cardiovascular: Negative.   Gastrointestinal: Negative.   Skin: Negative.   Neurological: Negative.   Hematological: Negative.   Psychiatric/Behavioral: Negative.   All other systems reviewed and are negative.    BP 102/60  Pulse 65  Ht 5\' 8"  (1.727 m)  Wt 190 lb 12 oz (86.524 kg)  BMI 29.00 kg/m2  Physical Exam  Nursing note and vitals reviewed. Constitutional: He is oriented to person, place, and time. He appears well-developed and well-nourished.  HENT:  Head: Normocephalic.  Nose: Nose normal.  Mouth/Throat: Oropharynx is clear and moist.  Eyes: Conjunctivae normal are normal. Pupils are equal, round, and reactive to light.  Neck: Normal range of motion. Neck supple. No JVD present.  Cardiovascular: Normal rate, regular rhythm, S1 normal, S2 normal, normal heart sounds and intact distal pulses.  Exam reveals no gallop and no friction rub.   No murmur heard. Pulmonary/Chest: Effort normal and breath sounds normal. No respiratory distress. He has no wheezes. He has no rales. He exhibits no tenderness.  Abdominal: Soft. Bowel sounds are normal. He exhibits no distension. There is no tenderness.  Musculoskeletal: Normal range of motion. He  exhibits no edema and no tenderness.  Lymphadenopathy:    He has no cervical adenopathy.  Neurological: He is alert and oriented to person, place, and time. Coordination normal.  Skin: Skin is warm and dry. No rash noted. No erythema.  Psychiatric: He has a normal mood and affect. His behavior is normal. Judgment and thought content normal.           Assessment and Plan

## 2012-10-01 ENCOUNTER — Ambulatory Visit (INDEPENDENT_AMBULATORY_CARE_PROVIDER_SITE_OTHER): Payer: 59 | Admitting: Internal Medicine

## 2012-10-01 ENCOUNTER — Other Ambulatory Visit (INDEPENDENT_AMBULATORY_CARE_PROVIDER_SITE_OTHER): Payer: 59

## 2012-10-01 ENCOUNTER — Encounter: Payer: Self-pay | Admitting: Internal Medicine

## 2012-10-01 VITALS — BP 118/82 | HR 61 | Temp 98.2°F | Ht 68.0 in | Wt 192.8 lb

## 2012-10-01 DIAGNOSIS — E119 Type 2 diabetes mellitus without complications: Secondary | ICD-10-CM

## 2012-10-01 DIAGNOSIS — E785 Hyperlipidemia, unspecified: Secondary | ICD-10-CM

## 2012-10-01 DIAGNOSIS — I10 Essential (primary) hypertension: Secondary | ICD-10-CM

## 2012-10-01 LAB — LIPID PANEL
HDL: 48.1 mg/dL (ref >39.00)
LDL Cholesterol: 61 mg/dL (ref 0–99)
Total CHOL/HDL Ratio: 3
VLDL: 34.6 mg/dL (ref 0.0–40.0)

## 2012-10-01 MED ORDER — LISINOPRIL 10 MG PO TABS: 5.0000 mg | ORAL_TABLET | Freq: Every day | ORAL | Status: DC

## 2012-10-01 NOTE — Patient Instructions (Signed)
It was good to see you today. Test(s) ordered today. Your results will be released to MyChart (or called to you) after review, usually within 72hours after test completion. If any changes need to be made, you will be notified at that same time. Will also mail you a copy of the results if you Medications reviewed, no changes at this time. Please schedule followup in 6 months for diabetes mellitus and blood pressure check - call sooner if problems.

## 2012-10-01 NOTE — Assessment & Plan Note (Addendum)
Reports good control - on metformin (dose increased 03/2012) + statin and ASA  ACEI temporarily stopped summer 2013, but now resumed - check urine microalb now Check a1c now, titrate if needed Lab Results  Component Value Date   HGBA1C 7.6* 04/02/2012

## 2012-10-01 NOTE — Progress Notes (Signed)
Subjective:    [name], male    DOB: April 10, 1934, 78 y.o.   MRN: [id number]  HPI  Here for follow up - reviewed chronic medical issues:  CAD s/p CABG 02/2009 - reports compliance with ongoing medical treatment and no changes in medication dose or frequency. denies adverse side effects related to current therapy.   DM2 -reports compliance with ongoing medical treatment and no changes in medication dose or frequency. denies adverse side effects related to current therapy. infreq checks cgs at home   dyslipidemia - reports compliance with ongoing medical treatment and no changes in medication dose or frequency. denies adverse side effects related to current therapy.   hypertension - reports compliance with ongoing medical treatment and no changes in medication dose or frequency. denies adverse side effects related to current therapy.   LBP - follows with ortho for same - s/p series of steroid injections starting in 2012 and ongoing for lumbar disc dz with RLE radiculopathy - back pain and R hip discomfort - ongoing care with PMR at [facility]  Past Medical History  Diagnosis Date  . Coronary artery disease 02/2009    severe left main and three-vessel, CABG  . Colonic polyp 2011 last colo    colo q 60yr - [facility]   . Diverticulosis of colon   . DEGENERATIVE JOINT DISEASE   . VITAMIN D DEFICIENCY   . DIVERTICULOSIS, COLON   . HYPERLIPIDEMIA-MIXED   . HYPERTENSION, BENIGN   . Type II or unspecified type diabetes mellitus without mention of complication, not stated as uncontrolled   . LBP (low back pain)     ESI - [place]     Review of Systems  Constitutional: Positive for fatigue. Negative for unexpected weight change.  Respiratory: Negative for cough and shortness of breath.   Cardiovascular: Negative for chest pain and leg swelling.  Neurological: Negative for headaches.      Objective:   Physical Exam  BP 118/82  Pulse 61  Temp 98.2 F (36.8 C) (Oral)  Ht 5\' 8"   (1.727 m)  Wt 192 lb 12.8 oz (87.454 kg)  BMI 29.32 kg/m2  SpO2 97% Wt Readings from Last 3 Encounters:  10/01/12 192 lb 12.8 oz (87.454 kg)  08/28/12 190 lb 12 oz (86.524 kg)  05/28/12 186 lb (84.369 kg)   Constitutional: He appears well-developed and well-nourished. No distress.  Neck: Normal range of motion. Neck supple. No JVD present. No thyromegaly present.  Cardiovascular: Normal rate, regular rhythm and normal heart sounds.  No murmur heard. no BLE edema Pulmonary/Chest: Effort normal and breath sounds normal. No respiratory distress. no wheezes.  Neurological: he is alert and oriented to person, place, and time. No cranial nerve deficit. Coordination normal.  Psychiatric: he has a normal mood and affect. behavior is normal. Judgment and thought content normal.   Lab Results  Component Value Date   WBC 6.9 04/18/2011   HGB 12.4* 04/18/2011   HCT 36.5* 04/18/2011   PLT 199.0 04/18/2011   CHOL 137 10/03/2011   TRIG 175.0* 10/03/2011   HDL 48.40 10/03/2011   LDLDIRECT 72.8 04/18/2011   ALT 16 10/03/2011   AST 14 10/03/2011   NA 141 10/03/2011   K 4.5 10/03/2011   CL 107 10/03/2011   CREATININE 1.1 10/03/2011   BUN 31* 10/03/2011   CO2 26 10/03/2011   TSH 1.07 04/18/2011   PSA 0.6 09/22/2010   INR 1.5 03/17/2009   HGBA1C 7.6* 04/02/2012  Assessment & Plan:  See problem list. Medications and labs reviewed today.

## 2012-10-01 NOTE — Assessment & Plan Note (Signed)
On statin - tolerating well - cards changed cretstor to atorva 08/2012 for $ goal LDL<70 with CAD hx The current medical regimen is effective;  continue present plan and medications.

## 2012-10-01 NOTE — Assessment & Plan Note (Addendum)
BP Readings from Last 3 Encounters:  10/01/12 118/82  08/28/12 102/60  05/28/12 100/60  ACEI stopped temporarily summer 2013 due to side effects, now resumed The current medical regimen is effective;  continue present plan and medications.

## 2012-10-10 ENCOUNTER — Other Ambulatory Visit: Payer: Self-pay | Admitting: Cardiovascular Disease

## 2012-10-10 NOTE — Telephone Encounter (Signed)
Refilled Plavix

## 2012-11-30 ENCOUNTER — Other Ambulatory Visit: Payer: Self-pay | Admitting: Internal Medicine

## 2012-12-09 ENCOUNTER — Other Ambulatory Visit: Payer: Self-pay | Admitting: *Deleted

## 2012-12-09 MED ORDER — LISINOPRIL 10 MG PO TABS: 5.0000 mg | ORAL_TABLET | Freq: Every day | ORAL | Status: DC

## 2012-12-09 NOTE — Telephone Encounter (Signed)
Refill sent for lisinopril  

## 2013-02-17 ENCOUNTER — Other Ambulatory Visit: Payer: Self-pay | Admitting: Internal Medicine

## 2013-02-26 ENCOUNTER — Telehealth: Payer: Self-pay

## 2013-02-26 NOTE — Telephone Encounter (Signed)
Per [facility], pt is to have injection and needs to know if ok to hold Plavix x 7 days prior We will fax letter to them at [phone]

## 2013-02-26 NOTE — Telephone Encounter (Signed)
error 

## 2013-02-27 NOTE — Telephone Encounter (Signed)
Ok to hold plavix Would continue asa 81 mg daily

## 2013-02-28 NOTE — Telephone Encounter (Signed)
will fax letter

## 2013-03-26 ENCOUNTER — Encounter: Payer: Self-pay | Admitting: Internal Medicine

## 2013-03-26 ENCOUNTER — Ambulatory Visit (INDEPENDENT_AMBULATORY_CARE_PROVIDER_SITE_OTHER): Payer: 59 | Admitting: Internal Medicine

## 2013-03-26 ENCOUNTER — Other Ambulatory Visit (INDEPENDENT_AMBULATORY_CARE_PROVIDER_SITE_OTHER): Payer: 59

## 2013-03-26 VITALS — BP 110/62 | HR 68 | Temp 97.9°F | Wt 180.4 lb

## 2013-03-26 DIAGNOSIS — Z23 Encounter for immunization: Secondary | ICD-10-CM

## 2013-03-26 DIAGNOSIS — E1149 Type 2 diabetes mellitus with other diabetic neurological complication: Secondary | ICD-10-CM

## 2013-03-26 DIAGNOSIS — I10 Essential (primary) hypertension: Secondary | ICD-10-CM

## 2013-03-26 DIAGNOSIS — E785 Hyperlipidemia, unspecified: Secondary | ICD-10-CM

## 2013-03-26 LAB — HEMOGLOBIN A1C: Hgb A1c MFr Bld: 6.7 % — ABNORMAL HIGH (ref 4.6–6.5)

## 2013-03-26 MED ORDER — METFORMIN HCL ER 500 MG PO TB24: ORAL_TABLET | ORAL | Status: DC

## 2013-03-26 NOTE — Progress Notes (Signed)
  Subjective:    [name], male    DOB: 1933/12/19, 79 y.o.   MRN: [id number]  HPI  Here for follow up - reviewed chronic medical issues:  CAD s/p CABG 02/2009 , DM2, Dyslipidemia, Hypertension, LBP -   Past Medical History  Diagnosis Date  . Coronary artery disease 02/2009    severe left main and three-vessel, CABG  . Colonic polyp 2011 last colo    colo q 57yr - [facility]   . Diverticulosis of colon   . DEGENERATIVE JOINT DISEASE   . VITAMIN D DEFICIENCY   . DIVERTICULOSIS, COLON   . HYPERLIPIDEMIA-MIXED   . HYPERTENSION, BENIGN   . Type II or unspecified type diabetes mellitus without mention of complication, not stated as uncontrolled   . LBP (low back pain)     ESI - [place]     Review of Systems  Constitutional: Positive for fatigue. Negative for unexpected weight change.  Respiratory: Negative for cough and shortness of breath.   Cardiovascular: Negative for chest pain and leg swelling.  Neurological: Negative for headaches.      Objective:   Physical Exam  BP 110/62  Pulse 68  Temp(Src) 97.9 F (36.6 C) (Oral)  Wt 180 lb 6.4 oz (81.829 kg)  BMI 27.44 kg/m2  SpO2 98% Wt Readings from Last 3 Encounters:  03/26/13 180 lb 6.4 oz (81.829 kg)  10/01/12 192 lb 12.8 oz (87.454 kg)  08/28/12 190 lb 12 oz (86.524 kg)   Constitutional: He appears well-developed and well-nourished. No distress.  Neck: Normal range of motion. Neck supple. No JVD present. No thyromegaly present.  Cardiovascular: Normal rate, regular rhythm and normal heart sounds.  No murmur heard. no BLE edema Pulmonary/Chest: Effort normal and breath sounds normal. No respiratory distress. no wheezes.  Neurological: he is alert and oriented to person, place, and time. No cranial nerve deficit. Coordination normal.  Psychiatric: he has a normal mood and affect. behavior is normal. Judgment and thought content normal.   Lab Results  Component Value Date   WBC 6.9 04/18/2011   HGB 12.4*  04/18/2011   HCT 36.5* 04/18/2011   PLT 199.0 04/18/2011   CHOL 144 10/01/2012   TRIG 173.0* 10/01/2012   HDL 48.10 10/01/2012   LDLDIRECT 72.8 04/18/2011   ALT 16 10/03/2011   AST 14 10/03/2011   NA 141 10/03/2011   K 4.5 10/03/2011   CL 107 10/03/2011   CREATININE 1.1 10/03/2011   BUN 31* 10/03/2011   CO2 26 10/03/2011   TSH 1.07 04/18/2011   PSA 0.6 09/22/2010   INR 1.5 03/17/2009   HGBA1C 6.6* 10/01/2012   MICROALBUR 0.6 10/01/2012       Assessment & Plan:  See problem list. Medications and labs reviewed today.

## 2013-03-26 NOTE — Assessment & Plan Note (Signed)
BP Readings from Last 3 Encounters:  03/26/13 110/62  10/01/12 118/82  08/28/12 102/60  ACEI stopped temporarily summer 2013 due to side effects, then resumed fall 2013 The current medical regimen is effective;  continue present plan and medications.

## 2013-03-26 NOTE — Patient Instructions (Signed)
It was good to see you today. Test(s) ordered today. Your results will be released to MyChart (or called to you) after review, usually within 72hours after test completion. If any changes need to be made, you will be notified at that same time. Will also mail you a copy of the results if you Medications reviewed, no changes at this time. Please schedule followup in 6 months for diabetes mellitus and blood pressure check - call sooner if problems.

## 2013-03-26 NOTE — Assessment & Plan Note (Signed)
On statin - tolerating well - cards changed cretstor to atorva 08/2012 for $ goal LDL<70 with CAD hx - check annually, last lipids reviewed The current medical regimen is effective;  continue present plan and medications.  

## 2013-03-26 NOTE — Assessment & Plan Note (Signed)
Reports good control - on metformin (dose increased 03/2012) + statin and ASA  Suspect improved a1c with intentional weight loss efforts over early 2014 ACEI temporarily stopped summer 2013, but then resumed - check urine microalb now Check a1c now, adjust as needed Lab Results  Component Value Date   HGBA1C 6.6* 10/01/2012

## 2013-03-27 ENCOUNTER — Ambulatory Visit (INDEPENDENT_AMBULATORY_CARE_PROVIDER_SITE_OTHER): Payer: 59 | Admitting: Cardiovascular Disease

## 2013-03-27 ENCOUNTER — Encounter: Payer: Self-pay | Admitting: Cardiovascular Disease

## 2013-03-27 VITALS — BP 78/52 | HR 81 | Ht 68.0 in | Wt 181.5 lb

## 2013-03-27 DIAGNOSIS — I25810 Atherosclerosis of coronary artery bypass graft(s) without angina pectoris: Secondary | ICD-10-CM

## 2013-03-27 DIAGNOSIS — I10 Essential (primary) hypertension: Secondary | ICD-10-CM

## 2013-03-27 DIAGNOSIS — I951 Orthostatic hypotension: Secondary | ICD-10-CM | POA: Insufficient documentation

## 2013-03-27 DIAGNOSIS — E1149 Type 2 diabetes mellitus with other diabetic neurological complication: Secondary | ICD-10-CM

## 2013-03-27 DIAGNOSIS — E785 Hyperlipidemia, unspecified: Secondary | ICD-10-CM

## 2013-03-27 NOTE — Assessment & Plan Note (Signed)
Hemoglobin A1c much improved. We have complemented him on his weight loss

## 2013-03-27 NOTE — Assessment & Plan Note (Signed)
blood pressures is running very low today. We have again suggested he hold his lisinopril, increase his fluids and salt intake. If blood pressure continues to run low, I suggested he call us. Decrease in his blood pressure likely exacerbated by recent weight loss.

## 2013-03-27 NOTE — Assessment & Plan Note (Signed)
Currently with no symptoms of angina. No further workup at this time. Continue current medication regimen. 

## 2013-03-27 NOTE — Progress Notes (Signed)
[name], male    DOB: Jan 15, 1934, 79 y.o.   MRN: [id number]  HPI Comments: Andrew [name] is a very pleasant 79 year old gentleman with history of coronary artery disease, bypass in May 2010, hyperlipidemia, diabetes, hypertension, who presents for Routine evaluation.  He presents today and reports overall feeling well. Occasional lightheadedness when he works in the garden. Biggest complaint is arthritis in his hips, back, knees. He has lost 6 pounds from last year, try to lose weight intentionally for better diabetes control.  Blood pressure is very low in the office. We've previously held ACE inhibitor. He is taking one half dose of his lisinopril 5 mg daily in addition to carvedilol 3.125 mg twice a day. Blood pressure typically does run low, previous measurements by myself prior to stopping ACE inhibitor in the past showed systolics in the 80s  Hemoglobin A1c 7.6. Last year which has improved to 6.7 recently.  No significant aerobic activity.   He did receive  cortisone shots to his back recently He denies any recent chest pain or worsening shortness of breath.   Lab work in December 2013 showed total cholesterol 144, LDL 61   EKG shows normal sinus rhythm with rate 81 beats per minute, right bundle branch block.     Outpatient Encounter Prescriptions as of 03/27/2013  Medication Sig Dispense Refill  . aspirin 81 MG tablet Take 81 mg by mouth daily.        Marland Kitchen atorvastatin (LIPITOR) 20 MG tablet Take 1 tablet (20 mg total) by mouth daily.  90 tablet  3  . carvedilol (COREG) 6.25 MG tablet Takes 1/2 tablet twice daily.      . celecoxib (CELEBREX) 200 MG capsule Take 200 mg by mouth 2 (two) times daily.      . cholecalciferol (VITAMIN D) 1000 UNITS tablet Take 1,000 Units by mouth daily.      . clopidogrel (PLAVIX) 75 MG tablet TAKE 1 TABLET DAILY  90 tablet  2  . cyclobenzaprine (FLEXERIL) 10 MG tablet Take 10 mg by mouth as needed.       Marland Kitchen lisinopril (PRINIVIL,ZESTRIL) 10  MG tablet Take 0.5 tablets (5 mg total) by mouth daily.  45 tablet  3  . metFORMIN (GLUCOPHAGE-XR) 500 MG 24 hr tablet TAKE 2 TABLETS TWICE A DAY  360 tablet  1  . NIASPAN 1000 MG CR tablet TAKE 1 TABLET AT BEDTIME  90 tablet  3  . traMADol (ULTRAM) 50 MG tablet Take 50 mg by mouth every 6 (six) hours as needed. Maximum dose= 8 tablets per day         Review of Systems  HENT: Negative.   Eyes: Negative.   Respiratory: Negative.   Cardiovascular: Negative.   Gastrointestinal: Negative.   Skin: Negative.   Neurological: Positive for light-headedness.  Psychiatric/Behavioral: Negative.   All other systems reviewed and are negative.    BP 78/52  Pulse 81  Ht 5\' 8"  (1.727 m)  Wt 181 lb 8 oz (82.328 kg)  BMI 27.6 kg/m2  Physical Exam  Nursing note and vitals reviewed. Constitutional: He is oriented to person, place, and time. He appears well-developed and well-nourished.  HENT:  Head: Normocephalic.  Nose: Nose normal.  Mouth/Throat: Oropharynx is clear and moist.  Eyes: Conjunctivae are normal. Pupils are equal, round, and reactive to light.  Neck: Normal range of motion. Neck supple. No JVD present.  Cardiovascular: Normal rate, regular rhythm, S1 normal, S2 normal, normal heart sounds and intact distal  pulses.  Exam reveals no gallop and no friction rub.   No murmur heard. Pulmonary/Chest: Effort normal and breath sounds normal. No respiratory distress. He has no wheezes. He has no rales. He exhibits no tenderness.  Abdominal: Soft. Bowel sounds are normal. He exhibits no distension. There is no tenderness.  Musculoskeletal: Normal range of motion. He exhibits no edema and no tenderness.  Lymphadenopathy:    He has no cervical adenopathy.  Neurological: He is alert and oriented to person, place, and time. Coordination normal.  Skin: Skin is warm and dry. No rash noted. No erythema.  Psychiatric: He has a normal mood and affect. His behavior is normal. Judgment and thought  content normal.      Assessment and Plan

## 2013-03-27 NOTE — Patient Instructions (Addendum)
You are doing well. Please hold the lisinopril, blood pressure is low Monitor you blood pressure Call the office if it continues to run low  Please call us if you have new issues that need to be addressed before your next appt.  Your physician wants you to follow-up in: 6 months.  You will receive a reminder letter in the mail two months in advance. If you don't receive a letter, please call our office to schedule the follow-up appointment.

## 2013-03-27 NOTE — Assessment & Plan Note (Signed)
Cholesterol is at goal on the current lipid regimen. No changes to the medications were made.  

## 2013-04-07 ENCOUNTER — Telehealth: Payer: Self-pay

## 2013-04-07 NOTE — Telephone Encounter (Signed)
Needs cardiac clearance for surgery scheduled next WEd 25. Needs clearance by tomorrow. I did inform her dr Mariah Milling was not here until tomorrow, Please call. States she will fax something too.

## 2013-04-07 NOTE — Telephone Encounter (Signed)
Please advise 

## 2013-04-08 NOTE — Telephone Encounter (Signed)
Doctors office called again...need this today please

## 2013-04-08 NOTE — Telephone Encounter (Signed)
Per Dr. Mariah Milling, "pt may hold Plavix x 5-7 days prior to procedure then resume. Should remain on ASA". V.O Dr. Alvis Lemmings, RN, BSN

## 2013-04-08 NOTE — Telephone Encounter (Signed)
Letter faxed.

## 2013-05-01 ENCOUNTER — Other Ambulatory Visit: Payer: Self-pay

## 2013-05-10 ENCOUNTER — Other Ambulatory Visit: Payer: Self-pay | Admitting: Internal Medicine

## 2013-05-28 ENCOUNTER — Other Ambulatory Visit: Payer: Self-pay | Admitting: Orthopedic Surgery

## 2013-06-02 ENCOUNTER — Telehealth: Payer: Self-pay | Admitting: *Deleted

## 2013-06-02 NOTE — Telephone Encounter (Signed)
Faxed surgical clearance...lmb

## 2013-06-02 NOTE — Telephone Encounter (Signed)
Ok to fax OV 03/2013 - it is felt that the surgical risk is low and outweighed by the potential benefit of the surgery. Therefore, medically clear to proceed when scheduling allows if ok/clear with his cardiologist, Dr Mariah Milling.

## 2013-06-02 NOTE — Telephone Encounter (Signed)
Left msg on vm statig fax over surgical clearance on 05/28/13. Check on status...lmb

## 2013-06-11 ENCOUNTER — Encounter (HOSPITAL_COMMUNITY): Payer: Self-pay | Admitting: Pharmacy Technician

## 2013-06-12 NOTE — Pre-Procedure Instructions (Addendum)
Andrew [name]  06/12/2013   Your procedure is scheduled on:  Thursday, August 28th  Report to [facility] at 0530 AM.  Call this number if you have problems the morning of surgery: [phone]   Remember:   Do not eat food or drink liquids after midnight.   Take these medicines the morning of surgery with A SIP OF WATER: Coreg, aspirin,  Pain med if needed     Do NOT take any diabetes medication the morning of surgery   And may stop plavix on 8.23.14 per dr Lewie Loron( note in epic 5-7 days prior to surgery)   Do not wear jewelry.  Do not wear lotions, powders, or perfumes. You may wear deodorant.  Do not shave 48 hours prior to surgery. Men may shave face and neck.  Do not bring valuables to the hospital.  [facility] is not responsible   for any belongings or valuables.  Contacts, dentures or bridgework may not be worn into surgery.  Leave suitcase in the car. After surgery it may be brought to your room.  For patients admitted to the hospital, checkout time is 11:00 AM the day of discharge.   Special Instructions: Shower using CHG 2 nights before surgery and the night before surgery.  If you shower the day of surgery use CHG.  Use special wash - you have one bottle of CHG for all showers.  You should use approximately 1/3 of the bottle for each shower.   Please read over the following fact sheets that you were given: Pain Booklet, Coughing and Deep Breathing, Blood Transfusion Information, MRSA Information and Surgical Site Infection Prevention

## 2013-06-13 ENCOUNTER — Ambulatory Visit (HOSPITAL_COMMUNITY)
Admission: RE | Admit: 2013-06-13 | Discharge: 2013-06-13 | Disposition: A | Discharge status: Home / Self Care | Payer: Medicare Other | Source: Ambulatory Visit | Attending: Orthopedic Surgery | Admitting: Orthopedic Surgery

## 2013-06-13 ENCOUNTER — Encounter (HOSPITAL_COMMUNITY): Payer: Self-pay

## 2013-06-13 ENCOUNTER — Encounter (HOSPITAL_COMMUNITY)
Admission: RE | Admit: 2013-06-13 | Discharge: 2013-06-13 | Disposition: A | Discharge status: Home / Self Care | Payer: Medicare Other | Source: Ambulatory Visit | Attending: Orthopedic Surgery | Admitting: Orthopedic Surgery

## 2013-06-13 DIAGNOSIS — Z01818 Encounter for other preprocedural examination: Secondary | ICD-10-CM | POA: Insufficient documentation

## 2013-06-13 DIAGNOSIS — Z01812 Encounter for preprocedural laboratory examination: Secondary | ICD-10-CM | POA: Insufficient documentation

## 2013-06-13 LAB — URINALYSIS, ROUTINE W REFLEX MICROSCOPIC
Bilirubin Urine: NEGATIVE
Ketones, ur: NEGATIVE mg/dL
Nitrite: NEGATIVE
Protein, ur: NEGATIVE mg/dL
Specific Gravity, Urine: 1.024 (ref 1.005–1.030)
Urobilinogen, UA: 0.2 mg/dL (ref 0.0–1.0)

## 2013-06-13 LAB — CBC WITH DIFFERENTIAL/PLATELET
Basophils Absolute: 0 10*3/uL (ref 0.0–0.1)
Eosinophils Absolute: 4.2 10*3/uL — ABNORMAL HIGH (ref 0.0–0.7)
Lymphocytes Relative: 21 % (ref 12–46)
MCHC: 33.2 g/dL (ref 30.0–36.0)
Neutrophils Relative %: 36 % — ABNORMAL LOW (ref 43–77)
RDW: 14.3 % (ref 11.5–15.5)

## 2013-06-13 LAB — COMPREHENSIVE METABOLIC PANEL
Albumin: 3.6 g/dL (ref 3.5–5.2)
Alkaline Phosphatase: 64 U/L (ref 39–117)
BUN: 21 mg/dL (ref 6–23)
Calcium: 9.7 mg/dL (ref 8.4–10.5)
Potassium: 4.4 mEq/L (ref 3.5–5.1)
Sodium: 142 mEq/L (ref 135–145)
Total Protein: 6.6 g/dL (ref 6.0–8.3)

## 2013-06-13 LAB — TYPE AND SCREEN
ABO/RH(D): A POS
Antibody Screen: NEGATIVE

## 2013-06-13 LAB — SURGICAL PCR SCREEN
MRSA, PCR: INVALID — AB
Staphylococcus aureus: INVALID — AB

## 2013-06-13 LAB — APTT: aPTT: 31 seconds (ref 24–37)

## 2013-06-13 LAB — PROTIME-INR: Prothrombin Time: 12.7 seconds (ref 11.6–15.2)

## 2013-06-15 LAB — MRSA CULTURE

## 2013-06-16 NOTE — Progress Notes (Signed)
Anesthesia Chart Review:  Patient is a 79 year old male scheduled for L4-5 decompression on 06/19/13 by Dr Yevette Edwards.  History includes non-smoker, CAD s/p CABG (LIMA to LAD, SVG to DIAG, SVG to CX, SVG to PDA) 03/17/09, DM2, HTN, HLD, DJD, diverticulosis, vitamin D deficiency, inguinal hernia repair.  Cardiologist is Dr. Mariah Milling, last visit 03/27/13.  No new cardiac testing was ordered, and one year follow-up was recommended.  PCP is Dr. Rene Paci who wrote on 8/11/4, "it is felt that the surgical risk is low and outweighed by the potential benefit of the surgery. Therefore, medically clear to proceed when scheduling allows if ok/clear with his cardiologist, Dr Mariah Milling."  On 05/28/13, Albin Felling from [facility] faxed over a request for cardiac clearance for planned procedure and received a note from [facility] stating it was okay for patient to hold Plavix for surgery but should remain on ASA.  PAT RN notation indicates Plavix is being held starting 06/12/13.    EKG on 04/01/13 showed NSR, LAD, right BBB.  Echo on 11/30/09 showed:  - Left ventricle: Poor image quality. Unable to asses RWMA's. EF appears mildly decreased with possible septal hypokinesis The cavity size was normal. Wall thickness was increased in a pattern of mild LVH. - Atrial septum: No defect or patent foramen ovale was identified. - Tricuspid valve: Mild regurgitation.  His last cardiac cath was on 03/06/09 prior to his CABG and showed 3V CAD, EF 60%.    Preoperative CXR and labs noted.  Surgeon has notified both PCP and cardiology of plans for surgery.  Patient was asymptomatic from a CV standpoint at his last visit in June 2014.  If no acute changes then I would anticipate that he could proceed as planned.  Velna Ochs [facility]/Anesthesiology Phone [phone] 06/16/2013 1:57 PM

## 2013-06-18 MED ORDER — VANCOMYCIN HCL IN DEXTROSE 1-5 GM/200ML-% IV SOLN
1000.0000 mg | INTRAVENOUS | Status: AC
  Administered 2013-06-19: 1000 mg via INTRAVENOUS
  Filled 2013-06-18: qty 200

## 2013-06-19 ENCOUNTER — Encounter (HOSPITAL_COMMUNITY): Payer: Self-pay | Admitting: Vascular Surgery

## 2013-06-19 ENCOUNTER — Encounter (HOSPITAL_COMMUNITY): Payer: Self-pay | Admitting: *Deleted

## 2013-06-19 ENCOUNTER — Ambulatory Visit (HOSPITAL_COMMUNITY)
Admission: RE | Admit: 2013-06-19 | Discharge: 2013-06-19 | Disposition: A | Discharge status: Home / Self Care | Payer: Medicare Other | Source: Ambulatory Visit | Attending: Orthopedic Surgery | Admitting: Orthopedic Surgery

## 2013-06-19 ENCOUNTER — Encounter (HOSPITAL_COMMUNITY)
Admission: RE | Disposition: A | Discharge status: Home / Self Care | Payer: Self-pay | Source: Ambulatory Visit | Attending: Orthopedic Surgery

## 2013-06-19 ENCOUNTER — Ambulatory Visit (HOSPITAL_COMMUNITY): Payer: Medicare Other | Admitting: Critical Care Medicine

## 2013-06-19 ENCOUNTER — Ambulatory Visit (HOSPITAL_COMMUNITY): Payer: Medicare Other

## 2013-06-19 DIAGNOSIS — M79609 Pain in unspecified limb: Secondary | ICD-10-CM | POA: Insufficient documentation

## 2013-06-19 DIAGNOSIS — I10 Essential (primary) hypertension: Secondary | ICD-10-CM | POA: Insufficient documentation

## 2013-06-19 DIAGNOSIS — E119 Type 2 diabetes mellitus without complications: Secondary | ICD-10-CM | POA: Insufficient documentation

## 2013-06-19 DIAGNOSIS — Z951 Presence of aortocoronary bypass graft: Secondary | ICD-10-CM | POA: Insufficient documentation

## 2013-06-19 DIAGNOSIS — M5126 Other intervertebral disc displacement, lumbar region: Secondary | ICD-10-CM | POA: Insufficient documentation

## 2013-06-19 DIAGNOSIS — I2510 Atherosclerotic heart disease of native coronary artery without angina pectoris: Secondary | ICD-10-CM | POA: Insufficient documentation

## 2013-06-19 HISTORY — PX: LUMBAR LAMINECTOMY/DECOMPRESSION MICRODISCECTOMY: SHX5026

## 2013-06-19 LAB — GLUCOSE, CAPILLARY: Glucose-Capillary: 118 mg/dL — ABNORMAL HIGH (ref 70–99)

## 2013-06-19 SURGERY — LUMBAR LAMINECTOMY/DECOMPRESSION MICRODISCECTOMY
Anesthesia: General | Site: Spine Lumbar | Wound class: Clean

## 2013-06-19 MED ORDER — HYDROMORPHONE HCL PF 1 MG/ML IJ SOLN
INTRAMUSCULAR | Status: AC
  Filled 2013-06-19: qty 1

## 2013-06-19 MED ORDER — PHENYLEPHRINE HCL 10 MG/ML IJ SOLN
INTRAMUSCULAR | Status: DC | PRN
  Administered 2013-06-19: 40 ug via INTRAVENOUS
  Administered 2013-06-19: 80 ug via INTRAVENOUS

## 2013-06-19 MED ORDER — OXYCODONE HCL 5 MG PO TABS
5.0000 mg | ORAL_TABLET | Freq: Once | ORAL | Status: AC | PRN
  Administered 2013-06-19: 5 mg via ORAL

## 2013-06-19 MED ORDER — GLYCOPYRROLATE 0.2 MG/ML IJ SOLN
INTRAMUSCULAR | Status: DC | PRN
  Administered 2013-06-19: .6 mg via INTRAVENOUS

## 2013-06-19 MED ORDER — ONDANSETRON HCL 4 MG/2ML IJ SOLN
INTRAMUSCULAR | Status: DC | PRN
  Administered 2013-06-19: 4 mg via INTRAVENOUS

## 2013-06-19 MED ORDER — ARTIFICIAL TEARS OP OINT
TOPICAL_OINTMENT | OPHTHALMIC | Status: DC | PRN
  Administered 2013-06-19: 1 via OPHTHALMIC

## 2013-06-19 MED ORDER — METOPROLOL TARTRATE 1 MG/ML IV SOLN
INTRAVENOUS | Status: DC | PRN
  Administered 2013-06-19: 1 mg via INTRAVENOUS

## 2013-06-19 MED ORDER — FENTANYL CITRATE 0.05 MG/ML IJ SOLN
INTRAMUSCULAR | Status: DC | PRN
  Administered 2013-06-19: 150 ug via INTRAVENOUS
  Administered 2013-06-19 (×2): 50 ug via INTRAVENOUS

## 2013-06-19 MED ORDER — LACTATED RINGERS IV SOLN
INTRAVENOUS | Status: DC | PRN
  Administered 2013-06-19 (×2): via INTRAVENOUS

## 2013-06-19 MED ORDER — MINERAL OIL LIGHT 100 % EX OIL
TOPICAL_OIL | CUTANEOUS | Status: AC
  Filled 2013-06-19: qty 25

## 2013-06-19 MED ORDER — OXYCODONE HCL 5 MG PO TABS
ORAL_TABLET | ORAL | Status: AC
  Filled 2013-06-19: qty 1

## 2013-06-19 MED ORDER — OXYCODONE HCL 5 MG/5ML PO SOLN: 5.0000 mg | Freq: Once | ORAL | Status: AC | PRN

## 2013-06-19 MED ORDER — ROCURONIUM BROMIDE 100 MG/10ML IV SOLN
INTRAVENOUS | Status: DC | PRN
  Administered 2013-06-19: 50 mg via INTRAVENOUS

## 2013-06-19 MED ORDER — INDIGOTINDISULFONATE SODIUM 8 MG/ML IJ SOLN
INTRAMUSCULAR | Status: DC | PRN
  Administered 2013-06-19: .5 mL

## 2013-06-19 MED ORDER — LIDOCAINE HCL (CARDIAC) 20 MG/ML IV SOLN
INTRAVENOUS | Status: DC | PRN
  Administered 2013-06-19: 40 mg via INTRAVENOUS

## 2013-06-19 MED ORDER — INDIGOTINDISULFONATE SODIUM 8 MG/ML IJ SOLN
INTRAMUSCULAR | Status: AC
  Filled 2013-06-19: qty 5

## 2013-06-19 MED ORDER — THROMBIN 20000 UNITS EX SOLR
CUTANEOUS | Status: AC
  Filled 2013-06-19: qty 20000

## 2013-06-19 MED ORDER — NEOSTIGMINE METHYLSULFATE 1 MG/ML IJ SOLN
INTRAMUSCULAR | Status: DC | PRN
  Administered 2013-06-19: 4 mg via INTRAVENOUS

## 2013-06-19 MED ORDER — THROMBIN 20000 UNITS EX SOLR
CUTANEOUS | Status: DC | PRN
  Administered 2013-06-19: 08:00:00 via TOPICAL

## 2013-06-19 MED ORDER — HYDROMORPHONE HCL PF 1 MG/ML IJ SOLN
0.2500 mg | INTRAMUSCULAR | Status: DC | PRN
  Administered 2013-06-19 (×2): 0.5 mg via INTRAVENOUS

## 2013-06-19 MED ORDER — ALBUMIN HUMAN 5 % IV SOLN
INTRAVENOUS | Status: DC | PRN
  Administered 2013-06-19: 09:00:00 via INTRAVENOUS

## 2013-06-19 MED ORDER — PROPOFOL 10 MG/ML IV BOLUS
INTRAVENOUS | Status: DC | PRN
  Administered 2013-06-19: 100 mg via INTRAVENOUS

## 2013-06-19 MED ORDER — ONDANSETRON HCL 4 MG/2ML IJ SOLN: 4.0000 mg | Freq: Once | INTRAMUSCULAR | Status: DC | PRN

## 2013-06-19 MED ORDER — METHYLPREDNISOLONE ACETATE 40 MG/ML IJ SUSP
INTRAMUSCULAR | Status: AC
  Filled 2013-06-19: qty 1

## 2013-06-19 MED ORDER — BUPIVACAINE-EPINEPHRINE PF 0.25-1:200000 % IJ SOLN
INTRAMUSCULAR | Status: AC
  Filled 2013-06-19: qty 30

## 2013-06-19 MED ORDER — MIDAZOLAM HCL 5 MG/5ML IJ SOLN
INTRAMUSCULAR | Status: DC | PRN
  Administered 2013-06-19 (×2): 1 mg via INTRAVENOUS

## 2013-06-19 MED ORDER — POVIDONE-IODINE 7.5 % EX SOLN: Freq: Once | CUTANEOUS | Status: DC

## 2013-06-19 MED ORDER — BUPIVACAINE-EPINEPHRINE 0.25% -1:200000 IJ SOLN
INTRAMUSCULAR | Status: DC | PRN
  Administered 2013-06-19: 14 mL

## 2013-06-19 MED ORDER — PHENYLEPHRINE HCL 10 MG/ML IJ SOLN
10.0000 mg | INTRAVENOUS | Status: DC | PRN
  Administered 2013-06-19: 25 ug/min via INTRAVENOUS

## 2013-06-19 SURGICAL SUPPLY — 63 items
BENZOIN TINCTURE PRP APPL 2/3 (GAUZE/BANDAGES/DRESSINGS) ×2 IMPLANT
BUR ROUND PRECISION 4.0 (BURR) ×2 IMPLANT
CANISTER SUCTION 2500CC (MISCELLANEOUS) ×2 IMPLANT
CLOTH BEACON ORANGE TIMEOUT ST (SAFETY) ×2 IMPLANT
CORDS BIPOLAR (ELECTRODE) ×2 IMPLANT
COVER SURGICAL LIGHT HANDLE (MISCELLANEOUS) ×2 IMPLANT
DRAIN CHANNEL 15F RND FF W/TCR (WOUND CARE) IMPLANT
DRAPE POUCH INSTRU U-SHP 10X18 (DRAPES) ×2 IMPLANT
DRAPE SURG 17X23 STRL (DRAPES) ×8 IMPLANT
DURAPREP 26ML APPLICATOR (WOUND CARE) ×2 IMPLANT
ELECT BLADE 4.0 EZ CLEAN MEGAD (MISCELLANEOUS)
ELECT CAUTERY BLADE 6.4 (BLADE) ×2 IMPLANT
ELECT REM PT RETURN 9FT ADLT (ELECTROSURGICAL) ×2
ELECTRODE BLDE 4.0 EZ CLN MEGD (MISCELLANEOUS) IMPLANT
ELECTRODE REM PT RTRN 9FT ADLT (ELECTROSURGICAL) ×1 IMPLANT
EVACUATOR SILICONE 100CC (DRAIN) IMPLANT
FILTER STRAW FLUID ASPIR (MISCELLANEOUS) ×2 IMPLANT
GAUZE SPONGE 4X4 16PLY XRAY LF (GAUZE/BANDAGES/DRESSINGS) ×4 IMPLANT
GLOVE BIO SURGEON STRL SZ7 (GLOVE) ×2 IMPLANT
GLOVE BIO SURGEON STRL SZ8 (GLOVE) ×2 IMPLANT
GLOVE BIOGEL PI IND STRL 7.0 (GLOVE) ×1 IMPLANT
GLOVE BIOGEL PI IND STRL 8 (GLOVE) ×1 IMPLANT
GLOVE BIOGEL PI INDICATOR 7.0 (GLOVE) ×1
GLOVE BIOGEL PI INDICATOR 8 (GLOVE) ×1
GOWN STRL NON-REIN LRG LVL3 (GOWN DISPOSABLE) ×4 IMPLANT
GOWN STRL REIN XL XLG (GOWN DISPOSABLE) ×2 IMPLANT
IV CATH 14GX2 1/4 (CATHETERS) ×2 IMPLANT
KIT BASIN OR (CUSTOM PROCEDURE TRAY) ×2 IMPLANT
KIT POSITION SURG JACKSON T1 (MISCELLANEOUS) ×2 IMPLANT
KIT ROOM TURNOVER OR (KITS) ×2 IMPLANT
NEEDLE 18GX1X1/2 (RX/OR ONLY) (NEEDLE) ×2 IMPLANT
NEEDLE 22X1 1/2 (OR ONLY) (NEEDLE) ×2 IMPLANT
NEEDLE HYPO 25GX1X1/2 BEV (NEEDLE) ×2 IMPLANT
NEEDLE SPNL 18GX3.5 QUINCKE PK (NEEDLE) ×4 IMPLANT
NS IRRIG 1000ML POUR BTL (IV SOLUTION) ×2 IMPLANT
PACK LAMINECTOMY ORTHO (CUSTOM PROCEDURE TRAY) ×2 IMPLANT
PACK UNIVERSAL I (CUSTOM PROCEDURE TRAY) ×2 IMPLANT
PAD ARMBOARD 7.5X6 YLW CONV (MISCELLANEOUS) ×4 IMPLANT
PATTIES SURGICAL .5 X.5 (GAUZE/BANDAGES/DRESSINGS) ×2 IMPLANT
PATTIES SURGICAL .5 X1 (DISPOSABLE) ×2 IMPLANT
SPONGE GAUZE 4X4 12PLY (GAUZE/BANDAGES/DRESSINGS) ×2 IMPLANT
SPONGE INTESTINAL PEANUT (DISPOSABLE) ×2 IMPLANT
SPONGE SURGIFOAM ABS GEL 100 (HEMOSTASIS) ×2 IMPLANT
STRIP CLOSURE SKIN 1/2X4 (GAUZE/BANDAGES/DRESSINGS) ×2 IMPLANT
SURGIFLO TRUKIT (HEMOSTASIS) IMPLANT
SUT MNCRL AB 4-0 PS2 18 (SUTURE) ×2 IMPLANT
SUT VIC AB 0 CT1 18XCR BRD 8 (SUTURE) IMPLANT
SUT VIC AB 0 CT1 27 (SUTURE)
SUT VIC AB 0 CT1 27XBRD ANBCTR (SUTURE) IMPLANT
SUT VIC AB 0 CT1 8-18 (SUTURE)
SUT VIC AB 1 CT1 18XCR BRD 8 (SUTURE) ×1 IMPLANT
SUT VIC AB 1 CT1 8-18 (SUTURE) ×1
SUT VIC AB 2-0 CT2 18 VCP726D (SUTURE) ×2 IMPLANT
SYR 20CC LL (SYRINGE) IMPLANT
SYR BULB IRRIGATION 50ML (SYRINGE) ×2 IMPLANT
SYR CONTROL 10ML LL (SYRINGE) ×4 IMPLANT
SYR TB 1ML 26GX3/8 SAFETY (SYRINGE) ×4 IMPLANT
SYR TB 1ML LUER SLIP (SYRINGE) ×4 IMPLANT
TAPE CLOTH SURG 4X10 WHT LF (GAUZE/BANDAGES/DRESSINGS) ×2 IMPLANT
TOWEL OR 17X24 6PK STRL BLUE (TOWEL DISPOSABLE) ×2 IMPLANT
TOWEL OR 17X26 10 PK STRL BLUE (TOWEL DISPOSABLE) ×2 IMPLANT
WATER STERILE IRR 1000ML POUR (IV SOLUTION) ×2 IMPLANT
YANKAUER SUCT BULB TIP NO VENT (SUCTIONS) ×2 IMPLANT

## 2013-06-19 NOTE — Transfer of Care (Signed)
Immediate Anesthesia Transfer of Care Note  Patient: Andrew [name]  Procedure(s) Performed: Procedure(s) with comments: LUMBAR LAMINECTOMY/DECOMPRESSION MICRODISCECTOMY (N/A) - Lumbar 4-5 decompression  Patient Location: PACU  Anesthesia Type:General  Level of Consciousness: awake, alert  and oriented  Airway & Oxygen Therapy: Patient Spontanous Breathing and Patient connected to nasal cannula oxygen  Post-op Assessment: Report given to PACU RN and Post -op Vital signs reviewed and stable  Post vital signs: Reviewed and stable  Complications: No apparent anesthesia complications

## 2013-06-19 NOTE — Anesthesia Preprocedure Evaluation (Addendum)
Anesthesia Evaluation  Patient identified by MRN, date of birth, ID band Patient awake    Reviewed: Allergy & Precautions, H&P , NPO status , Patient's Chart, lab work & pertinent test results  Airway Mallampati: II TM Distance: >3 FB Neck ROM: Full    Dental  (+) Dental Advisory Given, Missing and Poor Dentition,    Pulmonary          Cardiovascular hypertension, + CAD and + CABG     Neuro/Psych    GI/Hepatic   Endo/Other  diabetes, Type 2  Renal/GU      Musculoskeletal   Abdominal   Peds  Hematology   Anesthesia Other Findings   Reproductive/Obstetrics                         Anesthesia Physical Anesthesia Plan  ASA: III  Anesthesia Plan: General   Post-op Pain Management:    Induction: Intravenous  Airway Management Planned: Oral ETT  Additional Equipment:   Intra-op Plan:   Post-operative Plan: Extubation in OR  Informed Consent: I have reviewed the patients History and Physical, chart, labs and discussed the procedure including the risks, benefits and alternatives for the proposed anesthesia with the patient or authorized representative who has indicated his/her understanding and acceptance.   Dental advisory given  Plan Discussed with: Anesthesiologist and Surgeon  Anesthesia Plan Comments:         Anesthesia Quick Evaluation

## 2013-06-19 NOTE — Anesthesia Postprocedure Evaluation (Signed)
  Anesthesia Post-op Note  Patient: Andrew [name]  Procedure(s) Performed: Procedure(s) with comments: LUMBAR LAMINECTOMY/DECOMPRESSION MICRODISCECTOMY (N/A) - Lumbar 4-5 decompression  Patient Location: PACU  Anesthesia Type:General  Level of Consciousness: awake, alert  and oriented  Airway and Oxygen Therapy: Patient Spontanous Breathing  Post-op Pain: mild  Post-op Assessment: Post-op Vital signs reviewed, Patient's Cardiovascular Status Stable, Respiratory Function Stable, Patent Airway and Pain level controlled  Post-op Vital Signs: stable  Complications: No apparent anesthesia complications

## 2013-06-19 NOTE — Op Note (Signed)
NAMEJABAREE, MERCADO                ACCOUNT NO.:  [id number]  MEDICAL RECORD NO.:  [id number]  LOCATION:  [facility]                         FACILITY:  [facility]  PHYSICIAN:  Estill Bamberg, MD      DATE OF BIRTH:  [birth year]  DATE OF PROCEDURE:  06/19/2013 DATE OF DISCHARGE:  06/19/2013                              OPERATIVE REPORT   PREOPERATIVE DIAGNOSIS:  L4-5 spinal stenosis resulting in right-sided leg pain secondary to an extruded L4-5 herniated disk fragment.  POSTOPERATIVE DIAGNOSIS:  L4-5 spinal stenosis resulting in right-sided leg pain secondary to an extruded L4-5 herniated disk fragment.  PROCEDURES:  L4-5 decompression with bilateral partial facetectomy and removal of large left-sided extruded L4-5 disk fragment.  SURGEON:  Estill Bamberg, MD  ASSISTANT:  Jason Coop, [facility]  ANESTHESIA:  General endotracheal anesthesia.  COMPLICATIONS:  None.  DISPOSITION:  Stable.  ESTIMATED BLOOD LOSS:  Minimal.  INDICATIONS FOR PROCEDURE:  Briefly, Mr. Forti is a very pleasant 79- year-old male, who did present to me on May 23, 2013 with severe debilitating pain in the left leg.  I did review an MRI, which was clearly notable for extruded L4-5 disk fragment.  The patient did obtain conservative care by Dr. Maurice Small, however, his pain did recur.  His pain was severe.  We did discuss proceeding with operative intervention.  Of note, the pain had been present since June, for about 2 months.  Of note, the patient does have a cardiac history, and the patient did obtain a preoperative evaluation, by his primary care physician, Dr. Felicity Coyer.  It was felt safe to proceed.  The patient fully understand the risks and limitations of the procedure as outlined in my preoperative note.  OPERATIVE DETAILS:  On June 19, 2013, the patient was brought to surgery and general endotracheal anesthesia was administered.  The patient was placed prone on a well-padded flat Jackson bed with a  spinal frame.  Antibiotics were given and a time-out procedure was performed. The back was prepped and draped in the usual sterile fashion.  I then made an incision overlying the L4-5 interspace.  The lamina of L4 and L5 were identified and subperiosteally exposed.  A lateral intraoperative radiograph did confirm the appropriate operative level.  A self- retaining retractor was placed.  I then used a 4-mm high-speed bur to remove the inferior and medial aspect of the L4 lamina.  I did perform a bilateral partial facetectomy, in order to make room for safe retraction of the traversing L5 nerve.  I then turned my attention towards the left side, and I was able to identify the traversing L5 nerve, which was clearly noted to be under pressure.  I was able to gain medial retraction of the nerve.  An extruded L4-5 disk fragment was readily noted.  It was a rather large fragment, and clearly causing significant compression on the nerve.  With an assistant holding medial retraction of the nerve, I was able to remove extruded fragments in the left lateral recess, which did extend to the midline.  There did continue to be a substantial protrusion at L4-5.  I therefore did use a 15-blade knife to perform an oblique  annulotomy.  I then used a micro upbiting pituitary to remove the protruding disk fragments.  I did work within the annulus of the disk while removing the fragments.  At this portion of the procedure, I was easily able to mobilize the nerve medially and laterally.  The wound was then copiously irrigated.  I then introduced 40 mg of Depo-Medrol about the epidural space, to minimize irritation of the traversing L5 nerve.  I then closed the fascia using #1 Vicryl.  The subcutaneous tissue was then closed using 2-0 Vicryl and the skin was closed using 3-0 Monocryl.  Benzoin and Steri-Strips were applied and followed by sterile dressing.  All instrument counts were correct at the termination  of the procedure.  Of note, Jason Coop was my assistant throughout the entirety of the procedure and did aided in essential retraction and suctioning needed throughout the surgery.     Estill Bamberg, MD     MD/MEDQ  D:  06/19/2013  T:  06/19/2013  Job:  [id number]  cc:   Romero Belling, MD Raenette Rover. Felicity Coyer, MD

## 2013-06-19 NOTE — H&P (Signed)
PREOPERATIVE H&P  Chief Complaint: intermittent left leg pain  HPI: Andrew [name] is a 79 y.o. male who presents with intermittent pain in left leg   Past Medical History  Diagnosis Date  . Coronary artery disease 02/2009    severe left main and three-vessel, CABG  . Colonic polyp 2011 last colo    colo q 58yr - [facility]   . Diverticulosis of colon   . DEGENERATIVE JOINT DISEASE   . VITAMIN D DEFICIENCY   . DIVERTICULOSIS, COLON   . HYPERLIPIDEMIA-MIXED   . HYPERTENSION, BENIGN   . Type II or unspecified type diabetes mellitus without mention of complication, not stated as uncontrolled   . LBP (low back pain)     ESI - [place]   Past Surgical History  Procedure Laterality Date  . Knee arthroscopy  2007    right  . Lumbar epidural injection  2013    Has as needed.  . Coronary artery bypass graft  03/17/09    x 4  . Inguinal hernia repair  1980  . Carpal tunnel release Right   . Trigger finger release Right    History   Social History  . Marital Status: Married    Spouse Name: N/A    Number of Children: N/A  . Years of Education: N/A   Social History Main Topics  . Smoking status: Never Smoker   . Smokeless tobacco: Never Used     Comment: Married, 3 grown children. Retired 04/2002 from [employer] work  . Alcohol Use: Yes  . Drug Use: No  . Sexual Activity: None   Other Topics Concern  . None   Social History Narrative  . None   Family History  Problem Relation Age of Onset  . Heart failure Mother     died age 34  . Heart failure Father     died age 3  . Heart disease Neg Hx   . Diabetes Other    Allergies  Allergen Reactions  . Penicillins Other (See Comments)    Taste buds   Prior to Admission medications   Medication Sig Start Date End Date Taking? Authorizing Provider  aspirin EC 81 MG tablet Take 81 mg by mouth daily.   Yes Historical Provider, MD  atorvastatin (LIPITOR) 20 MG tablet Take 1 tablet (20 mg total) by mouth daily.  08/28/12  Yes Antonieta Iba, MD  carvedilol (COREG) 6.25 MG tablet Take 3.125 mg by mouth 2 (two) times daily with a meal.   Yes Historical Provider, MD  celecoxib (CELEBREX) 200 MG capsule Take 200 mg by mouth 2 (two) times daily.   Yes Historical Provider, MD  cholecalciferol (VITAMIN D) 1000 UNITS tablet Take 1,000 Units by mouth daily.   Yes Historical Provider, MD  clopidogrel (PLAVIX) 75 MG tablet Take 75 mg by mouth daily.   Yes Historical Provider, MD  Hydrocodone-Acetaminophen 7.5-300 MG TABS Take 1 tablet by mouth 2 (two) times daily as needed (for back pain).   Yes Historical Provider, MD  metFORMIN (GLUCOPHAGE-XR) 500 MG 24 hr tablet Take 1,000 mg by mouth 2 (two) times daily.   Yes Historical Provider, MD  Multiple Vitamin (MULTIVITAMIN WITH MINERALS) TABS tablet Take 1 tablet by mouth daily.   Yes Historical Provider, MD  niacin (NIASPAN) 1000 MG CR tablet Take 1,000 mg by mouth at bedtime.   Yes Historical Provider, MD  traMADol (ULTRAM) 50 MG tablet Take 50 mg by mouth every 6 (six) hours as needed for  pain. Maximum dose= 8 tablets per day    Historical Provider, MD     All other systems have been reviewed and were otherwise negative with the exception of those mentioned in the HPI and as above.  Physical Exam: Filed Vitals:   06/19/13 0612  BP: 127/80  Pulse: 71  Temp: 97.1 F (36.2 C)  Resp: 18    General: Alert, no acute distress Cardiovascular: No pedal edema Respiratory: No cyanosis, no use of accessory musculature GI: No organomegaly, abdomen is soft and non-tender Skin: No lesions in the area of chief complaint Neurologic: Sensation intact distally Psychiatric: Patient is competent for consent with normal mood and affect Lymphatic: No axillary or cervical lymphadenopathy  MUSCULOSKELETAL: + SLR on left  Assessment/Plan: Left leg pain Plan for Procedure(s): LUMBAR LAMINECTOMY/DECOMPRESSION MICRODISCECTOMY L4/5   Emilee Hero,  MD 06/19/2013 7:20 AM

## 2013-06-19 NOTE — Anesthesia Procedure Notes (Signed)
Procedure Name: Intubation Date/Time: 06/19/2013 7:40 AM Performed by: Elon Alas Pre-anesthesia Checklist: Patient identified, Timeout performed, Emergency Drugs available, Suction available and Patient being monitored Patient Re-evaluated:Patient Re-evaluated prior to inductionOxygen Delivery Method: Circle system utilized Preoxygenation: Pre-oxygenation with 100% oxygen Intubation Type: IV induction Ventilation: Mask ventilation without difficulty Laryngoscope Size: Mac and 4 Grade View: Grade I Tube type: Oral Tube size: 7.5 mm Number of attempts: 1 Airway Equipment and Method: Stylet and Patient positioned with wedge pillow Placement Confirmation: positive ETCO2,  ETT inserted through vocal cords under direct vision and breath sounds checked- equal and bilateral Secured at: 23 cm Tube secured with: Tape Dental Injury: Teeth and Oropharynx as per pre-operative assessment

## 2013-06-19 NOTE — Preoperative (Signed)
Beta Blockers   Reason not to administer Beta Blockers:Not Applicable, pt given IV lopressor

## 2013-06-20 ENCOUNTER — Encounter (HOSPITAL_COMMUNITY): Payer: Self-pay | Admitting: Orthopedic Surgery

## 2013-07-24 ENCOUNTER — Other Ambulatory Visit: Payer: Self-pay | Admitting: *Deleted

## 2013-07-24 ENCOUNTER — Other Ambulatory Visit: Payer: Self-pay | Admitting: Cardiovascular Disease

## 2013-07-24 NOTE — Telephone Encounter (Signed)
Pt will be returning call on refill for plavix. Pt would like to hold on refill request until he figures out where he would like medication sent to.

## 2013-07-28 ENCOUNTER — Other Ambulatory Visit: Payer: Self-pay

## 2013-07-28 MED ORDER — CLOPIDOGREL BISULFATE 75 MG PO TABS: 75.0000 mg | ORAL_TABLET | Freq: Every day | ORAL | Status: DC

## 2013-07-28 NOTE — Telephone Encounter (Signed)
Spoke with Terrie Herbalist.) with [facility] mail order for refill of 90 day supply for Plavix 75 mg one tablet daily. The patient will receive the medication in 10 to 12 business days.

## 2013-08-04 ENCOUNTER — Other Ambulatory Visit: Payer: Self-pay | Admitting: Cardiovascular Disease

## 2013-08-28 ENCOUNTER — Other Ambulatory Visit: Payer: Self-pay

## 2013-09-19 ENCOUNTER — Other Ambulatory Visit: Payer: Self-pay | Admitting: Cardiovascular Disease

## 2013-09-23 ENCOUNTER — Telehealth: Payer: Self-pay

## 2013-09-23 NOTE — Telephone Encounter (Signed)
Pt called and states he needs to get shots in his back , and they are asking him to stop taking Plavix. Please call.

## 2013-09-23 NOTE — Telephone Encounter (Signed)
Spoke w/ pt.  He wanted to make sure that Dr. Mariah Milling was aware of cardiac clearance request. Informed him that Dr. Mariah Milling completed the form himself and his instructions were faxed this morning.

## 2013-09-25 ENCOUNTER — Encounter: Payer: Self-pay | Admitting: Cardiovascular Disease

## 2013-09-25 ENCOUNTER — Ambulatory Visit (INDEPENDENT_AMBULATORY_CARE_PROVIDER_SITE_OTHER): Payer: Medicare Other | Admitting: Cardiovascular Disease

## 2013-09-25 VITALS — BP 128/72 | HR 67 | Ht 68.0 in | Wt 181.2 lb

## 2013-09-25 DIAGNOSIS — I25810 Atherosclerosis of coronary artery bypass graft(s) without angina pectoris: Secondary | ICD-10-CM

## 2013-09-25 DIAGNOSIS — E1149 Type 2 diabetes mellitus with other diabetic neurological complication: Secondary | ICD-10-CM

## 2013-09-25 DIAGNOSIS — I951 Orthostatic hypotension: Secondary | ICD-10-CM

## 2013-09-25 DIAGNOSIS — E785 Hyperlipidemia, unspecified: Secondary | ICD-10-CM

## 2013-09-25 MED ORDER — TADALAFIL 5 MG PO TABS: 5.0000 mg | ORAL_TABLET | Freq: Every day | ORAL | Status: DC | PRN

## 2013-09-25 NOTE — Assessment & Plan Note (Signed)
Cholesterol is at goal on the current lipid regimen. No changes to the medications were made.  

## 2013-09-25 NOTE — Assessment & Plan Note (Signed)
Currently with no symptoms of angina. No further workup at this time. Continue current medication regimen. 

## 2013-09-25 NOTE — Assessment & Plan Note (Signed)
We have encouraged continued exercise, careful diet management in an effort to lose weight. 

## 2013-09-25 NOTE — Progress Notes (Signed)
[name], male    DOB: January 01, 1934, 79 y.o.   MRN: [id number]  HPI Comments:  Andrew [name] is a very pleasant 79 year old gentleman with history of coronary artery disease, bypass in May 2010, hyperlipidemia, diabetes, hypertension, who presents for Routine evaluation.  In followup today, he reports having a pinched nerve, back surgery 06/19/2013. He continues to have back pain and is now scheduled to have radiofrequency treatments x3 through [facility]. This is scheduled for next week. He has been instructed to hold his Plavix 7 days prior to the procedure (on each of the 3 procedures). He denies having any cardiac issues. Otherwise very active.  Biggest complaint is arthritis in his hips, back, knees.  Blood pressure low in the past and lisinopril was held. He only takes Coreg.  No significant aerobic activity.   Lab work in December 2013 showed total cholesterol 144, LDL 61   EKG shows normal sinus rhythm with right bundle branch block.     Outpatient Encounter Prescriptions as of 09/25/2013  Medication Sig  . aspirin EC 81 MG tablet Take 81 mg by mouth daily.  Marland Kitchen atorvastatin (LIPITOR) 20 MG tablet TAKE 1 TABLET DAILY  . carvedilol (COREG) 6.25 MG tablet Take 3.125 mg by mouth 2 (two) times daily with a meal.  . celecoxib (CELEBREX) 200 MG capsule Take 200 mg by mouth 2 (two) times daily.  . cholecalciferol (VITAMIN D) 1000 UNITS tablet Take 1,000 Units by mouth daily.  . clopidogrel (PLAVIX) 75 MG tablet Take 1 tablet (75 mg total) by mouth daily.  . metFORMIN (GLUCOPHAGE-XR) 500 MG 24 hr tablet Take 1,000 mg by mouth 2 (two) times daily.  . Multiple Vitamin (MULTIVITAMIN WITH MINERALS) TABS tablet Take 1 tablet by mouth daily.  . niacin (NIASPAN) 1000 MG CR tablet Take 1,000 mg by mouth at bedtime.  . tadalafil (CIALIS) 5 MG tablet Take 1 tablet (5 mg total) by mouth daily as needed for erectile dysfunction.  . [DISCONTINUED] carvedilol (COREG) 6.25 MG  tablet TAKE ONE-HALF (1/2) TABLET TWICE A DAY    Review of Systems  Constitutional: Negative.   HENT: Negative.   Eyes: Negative.   Respiratory: Negative.   Cardiovascular: Negative.   Gastrointestinal: Negative.   Endocrine: Negative.   Musculoskeletal: Positive for back pain.  Skin: Negative.   Allergic/Immunologic: Negative.   Hematological: Negative.   Psychiatric/Behavioral: Negative.   All other systems reviewed and are negative.    BP 128/72  Pulse 67  Ht 5\' 8"  (1.727 m)  Wt 181 lb 4 oz (82.214 kg)  BMI 27.57 kg/m2  Physical Exam  Nursing note and vitals reviewed. Constitutional: He is oriented to person, place, and time. He appears well-developed and well-nourished.  HENT:  Head: Normocephalic.  Nose: Nose normal.  Mouth/Throat: Oropharynx is clear and moist.  Eyes: Conjunctivae are normal. Pupils are equal, round, and reactive to light.  Neck: Normal range of motion. Neck supple. No JVD present.  Cardiovascular: Normal rate, regular rhythm, S1 normal, S2 normal, normal heart sounds and intact distal pulses.  Exam reveals no gallop and no friction rub.   No murmur heard. Pulmonary/Chest: Effort normal and breath sounds normal. No respiratory distress. He has no wheezes. He has no rales. He exhibits no tenderness.  Abdominal: Soft. Bowel sounds are normal. He exhibits no distension. There is no tenderness.  Musculoskeletal: Normal range of motion. He exhibits no edema and no tenderness.  Lymphadenopathy:    He has no cervical adenopathy.  Neurological: He is alert and oriented to person, place, and time. Coordination normal.  Skin: Skin is warm and dry. No rash noted. No erythema.  Psychiatric: He has a normal mood and affect. His behavior is normal. Judgment and thought content normal.      Assessment and Plan

## 2013-09-25 NOTE — Patient Instructions (Signed)
You are doing well. No medication changes were made.  Please call us if you have new issues that need to be addressed before your next appt.  Your physician wants you to follow-up in: 6 months.  You will receive a reminder letter in the mail two months in advance. If you don't receive a letter, please call our office to schedule the follow-up appointment.   

## 2013-09-25 NOTE — Assessment & Plan Note (Signed)
Tolerating Coreg. We'll not add any other medications given history of hypotension

## 2013-09-26 ENCOUNTER — Encounter: Payer: Self-pay | Admitting: Internal Medicine

## 2013-09-26 ENCOUNTER — Other Ambulatory Visit (INDEPENDENT_AMBULATORY_CARE_PROVIDER_SITE_OTHER): Payer: Medicare Other

## 2013-09-26 ENCOUNTER — Ambulatory Visit (INDEPENDENT_AMBULATORY_CARE_PROVIDER_SITE_OTHER): Payer: Medicare Other | Admitting: Internal Medicine

## 2013-09-26 VITALS — BP 112/70 | HR 75 | Temp 97.8°F | Ht 68.0 in | Wt 181.5 lb

## 2013-09-26 DIAGNOSIS — Z0000 Encounter for general adult medical examination without abnormal findings: Secondary | ICD-10-CM

## 2013-09-26 DIAGNOSIS — E1149 Type 2 diabetes mellitus with other diabetic neurological complication: Secondary | ICD-10-CM

## 2013-09-26 DIAGNOSIS — E785 Hyperlipidemia, unspecified: Secondary | ICD-10-CM

## 2013-09-26 LAB — BASIC METABOLIC PANEL
CO2: 26 mEq/L (ref 19–32)
Calcium: 9.4 mg/dL (ref 8.4–10.5)
Chloride: 106 mEq/L (ref 96–112)
Creatinine, Ser: 1.4 mg/dL (ref 0.4–1.5)
Glucose, Bld: 121 mg/dL — ABNORMAL HIGH (ref 70–99)

## 2013-09-26 LAB — CBC WITH DIFFERENTIAL/PLATELET
Basophils Relative: 0.3 % (ref 0.0–3.0)
Eosinophils Relative: 2.4 % (ref 0.0–5.0)
MCV: 96.1 fl (ref 78.0–100.0)
Monocytes Absolute: 0.6 10*3/uL (ref 0.1–1.0)
Monocytes Relative: 8.1 % (ref 3.0–12.0)
Neutrophils Relative %: 60.7 % (ref 43.0–77.0)
Platelets: 209 10*3/uL (ref 150.0–400.0)
RBC: 3.98 Mil/uL — ABNORMAL LOW (ref 4.22–5.81)
WBC: 7.5 10*3/uL (ref 4.5–10.5)

## 2013-09-26 LAB — MICROALBUMIN / CREATININE URINE RATIO
Microalb Creat Ratio: 0.6 mg/g (ref 0.0–30.0)
Microalb, Ur: 1.6 mg/dL (ref 0.0–1.9)

## 2013-09-26 LAB — LIPID PANEL
HDL: 50.7 mg/dL (ref >39.00)
LDL Cholesterol: 67 mg/dL (ref 0–99)
Total CHOL/HDL Ratio: 3

## 2013-09-26 LAB — HEPATIC FUNCTION PANEL
AST: 19 U/L (ref 0–37)
Alkaline Phosphatase: 55 U/L (ref 39–117)
Total Bilirubin: 0.7 mg/dL (ref 0.3–1.2)

## 2013-09-26 NOTE — Assessment & Plan Note (Signed)
Reports good control - on metformin (dose increased 03/2012) + statin and ASA  Suspect improved a1c with intentional weight loss efforts over early 2014 Off ACEI since summer 2013 -  check urine microalb now Check a1c now, adjust as needed Lab Results  Component Value Date   HGBA1C 6.7* 03/26/2013

## 2013-09-26 NOTE — Progress Notes (Signed)
Subjective:    [name], male    DOB: 1934-02-01, 79 y.o.   MRN: [id number]  HPI   Here for annual wellness  Diet: heart healthy, diabetic Physical activity: sedentary Depression/mood screen: negative Hearing: intact to whispered voice Visual acuity: grossly normal, performs annual eye exam  ADLs: capable Fall risk: none Home safety: good Cognitive evaluation: intact to orientation, naming, recall and repetition EOL planning: adv directives, full code/ I agree  I have personally reviewed and have noted 1. The patient's medical and social history 2. Their use of alcohol, tobacco or illicit drugs 3. Their current medications and supplements 4. The patient's functional ability including ADL's, fall risks, home safety risks and hearing or visual impairment. 5. Diet and physical activities 6. Evidence for depression or mood disorders  Also reviewed chronic medical issues:  CAD s/p CABG 02/2009 , DM2, Dyslipidemia, Hypertension, LBP -   Past Medical History  Diagnosis Date  . Coronary artery disease 02/2009    severe left main and three-vessel, CABG  . Colonic polyp 2011 last colo    colo q 40yr - [facility]   . Diverticulosis of colon   . DEGENERATIVE JOINT DISEASE   . VITAMIN D DEFICIENCY   . DIVERTICULOSIS, COLON   . HYPERLIPIDEMIA-MIXED   . HYPERTENSION, BENIGN   . Type II or unspecified type diabetes mellitus without mention of complication, not stated as uncontrolled   . LBP (low back pain)     ESI - [place]   Family History  Problem Relation Age of Onset  . Heart failure Mother     died age 73  . Heart failure Father     died age 43  . Heart disease Neg Hx   . Diabetes Other    History  Substance Use Topics  . Smoking status: Never Smoker   . Smokeless tobacco: Never Used     Comment: Married, 3 grown children. Retired 04/2002 from [employer] work  . Alcohol Use: Yes     Review of Systems  Constitutional: Positive for fatigue.  Negative for unexpected weight change.  Respiratory: Negative for cough and shortness of breath.   Cardiovascular: Negative for chest pain and leg swelling.  Neurological: Negative for headaches.  All other systems reviewed and are negative.      Objective:   Physical Exam BP 112/70  Pulse 75  Temp(Src) 97.8 F (36.6 C) (Oral)  Ht 5\' 8"  (1.727 m)  Wt 181 lb 8 oz (82.328 kg)  BMI 27.60 kg/m2  SpO2 96% Wt Readings from Last 3 Encounters:  09/26/13 181 lb 8 oz (82.328 kg)  09/25/13 181 lb 4 oz (82.214 kg)  06/13/13 182 lb 3.2 oz (82.645 kg)   Constitutional: He appears well-developed and well-nourished. No distress.  Neck: Normal range of motion. Neck supple. No JVD present. No thyromegaly present.  Cardiovascular: Normal rate, regular rhythm and normal heart sounds.  No murmur heard. no BLE edema Pulmonary/Chest: Effort normal and breath sounds normal. No respiratory distress. no wheezes.  Neurological: he is alert and oriented to person, place, and time. No cranial nerve deficit. Coordination normal.  Psychiatric: he has a normal mood and affect. behavior is normal. Judgment and thought content normal.   Lab Results  Component Value Date   WBC 11.2* 06/13/2013   HGB 12.3* 06/13/2013   HCT 37.1* 06/13/2013   PLT 215 06/13/2013   CHOL 144 10/01/2012   TRIG 173.0* 10/01/2012   HDL 48.10 10/01/2012   LDLDIRECT  72.8 04/18/2011   ALT 14 06/13/2013   AST 15 06/13/2013   NA 142 06/13/2013   K 4.4 06/13/2013   CL 106 06/13/2013   CREATININE 1.03 06/13/2013   BUN 21 06/13/2013   CO2 26 06/13/2013   TSH 1.07 04/18/2011   PSA 0.6 09/22/2010   INR 0.97 06/13/2013   HGBA1C 6.7* 03/26/2013   MICROALBUR 0.6 10/01/2012       Assessment & Plan:   CPX/AWV/v70.0 - Today patient counseled on age appropriate routine health concerns for screening and prevention, each reviewed and up to date or declined. Immunizations reviewed and up to date or declined. Labs reviewed. Risk factors for depression  reviewed and negative. Hearing function and visual acuity are intact. ADLs screened and addressed as needed. Functional ability and level of safety reviewed and appropriate. Education, counseling and referrals performed based on assessed risks today. Patient provided with a copy of personalized plan for preventive services.  Also see problem list. Medications and labs reviewed today.

## 2013-09-26 NOTE — Patient Instructions (Addendum)
It was good to see you today.  We have reviewed your prior records including labs and tests today  Health Maintenance reviewed - all recommended immunizations and age-appropriate screenings are up-to-date. Consider Shingles vaccination as discussed  Test(s) ordered today. Your results will be released to MyChart (or called to you) after review, usually within 72hours after test completion. If any changes need to be made, you will be notified at that same time.  Medications reviewed and updated, no changes recommended at this time.  Please schedule followup in 6 months for diabetes mellitus check, call sooner if problems.  Health Maintenance, Males A healthy lifestyle and preventative care can promote health and wellness.  Maintain regular health, dental, and eye exams.  Eat a healthy diet. Foods like vegetables, fruits, whole grains, low-fat dairy products, and lean protein foods contain the nutrients you need without too many calories. Decrease your intake of foods high in solid fats, added sugars, and salt. Get information about a proper diet from your caregiver, if necessary.  Regular physical exercise is one of the most important things you can do for your health. Most adults should get at least 150 minutes of moderate-intensity exercise (any activity that increases your heart rate and causes you to sweat) each week. In addition, most adults need muscle-strengthening exercises on 2 or more days a week.   Maintain a healthy weight. The body mass index (BMI) is a screening tool to identify possible weight problems. It provides an estimate of body fat based on height and weight. Your caregiver can help determine your BMI, and can help you achieve or maintain a healthy weight. For adults 20 years and older:  A BMI below 18.5 is considered underweight.  A BMI of 18.5 to 24.9 is normal.  A BMI of 25 to 29.9 is considered overweight.  A BMI of 30 and above is considered obese.  Maintain  normal blood lipids and cholesterol by exercising and minimizing your intake of saturated fat. Eat a balanced diet with plenty of fruits and vegetables. Blood tests for lipids and cholesterol should begin at age 35 and be repeated every 5 years. If your lipid or cholesterol levels are high, you are over 50, or you are a high risk for heart disease, you may need your cholesterol levels checked more frequently.Ongoing high lipid and cholesterol levels should be treated with medicines, if diet and exercise are not effective.  If you smoke, find out from your caregiver how to quit. If you do not use tobacco, do not start.  Lung cancer screening is recommended for adults aged 57 80 years who are at high risk for developing lung cancer because of a history of smoking. Yearly low-dose computed tomography (CT) is recommended for people who have at least a 30-pack-year history of smoking and are a current smoker or have quit within the past 15 years. A pack year of smoking is smoking an average of 1 pack of cigarettes a day for 1 year (for example: 1 pack a day for 30 years or 2 packs a day for 15 years). Yearly screening should continue until the smoker has stopped smoking for at least 15 years. Yearly screening should also be stopped for people who develop a health problem that would prevent them from having lung cancer treatment.  If you choose to drink alcohol, do not exceed 2 drinks per day. One drink is considered to be 12 ounces (355 mL) of beer, 5 ounces (148 mL) of wine, or 1.5  ounces (44 mL) of liquor.  Avoid use of street drugs. Do not share needles with anyone. Ask for help if you need support or instructions about stopping the use of drugs.  High blood pressure causes heart disease and increases the risk of stroke. Blood pressure should be checked at least every 1 to 2 years. Ongoing high blood pressure should be treated with medicines if weight loss and exercise are not effective.  If you are 72 to  77 years old, ask your caregiver if you should take aspirin to prevent heart disease.  Diabetes screening involves taking a blood sample to check your fasting blood sugar level. This should be done once every 3 years, after age 45, if you are within normal weight and without risk factors for diabetes. Testing should be considered at a younger age or be carried out more frequently if you are overweight and have at least 1 risk factor for diabetes.  Colorectal cancer can be detected and often prevented. Most routine colorectal cancer screening begins at the age of 36 and continues through age 16. However, your caregiver may recommend screening at an earlier age if you have risk factors for colon cancer. On a yearly basis, your caregiver may provide home test kits to check for hidden blood in the stool. Use of a small camera at the end of a tube, to directly examine the colon (sigmoidoscopy or colonoscopy), can detect the earliest forms of colorectal cancer. Talk to your caregiver about this at age 73, when routine screening begins. Direct examination of the colon should be repeated every 5 to 10 years through age 25, unless early forms of pre-cancerous polyps or small growths are found.  Hepatitis C blood testing is recommended for all people born from [birth year] through 1963 and any individual with known risks for hepatitis C.  Healthy men should no longer receive prostate-specific antigen (PSA) blood tests as part of routine cancer screening. Consult with your caregiver about prostate cancer screening.  Testicular cancer screening is not recommended for adolescents or adult males who have no symptoms. Screening includes self-exam, caregiver exam, and other screening tests. Consult with your caregiver about any symptoms you have or any concerns you have about testicular cancer.  Practice safe sex. Use condoms and avoid high-risk sexual practices to reduce the spread of sexually transmitted infections  (STIs).  Use sunscreen. Apply sunscreen liberally and repeatedly throughout the day. You should seek shade when your shadow is shorter than you. Protect yourself by wearing long sleeves, pants, a wide-brimmed hat, and sunglasses year round, whenever you are outdoors.  Notify your caregiver of new moles or changes in moles, especially if there is a change in shape or color. Also notify your caregiver if a mole is larger than the size of a pencil eraser.  A one-time screening for abdominal aortic aneurysm (AAA) and surgical repair of large AAAs by sound wave imaging (ultrasonography) is recommended for ages 53 to 66 years who are current or former smokers.  Stay current with your immunizations. Document Released: 04/06/2008 Document Revised: 02/03/2013 Document Reviewed: 03/06/2011 [facility] Patient Information 2014 [place], [place].

## 2013-09-26 NOTE — Progress Notes (Signed)
Pre-visit discussion using our clinic review tool. No additional management support is needed unless otherwise documented below in the visit note.  

## 2013-09-26 NOTE — Assessment & Plan Note (Signed)
On statin - tolerating well - cards changed cretstor to atorva 08/2012 for $ goal LDL<70 with CAD hx - check annually, last lipids reviewed The current medical regimen is effective;  continue present plan and medications.  

## 2013-10-22 ENCOUNTER — Other Ambulatory Visit: Payer: Self-pay | Admitting: Internal Medicine

## 2013-11-19 ENCOUNTER — Telehealth: Payer: Self-pay | Admitting: *Deleted

## 2013-11-19 NOTE — Telephone Encounter (Signed)
Dr. Maurice SmallIbazebo office sent surgical clearance for the patient to come off of plavix for Lumbar epidural injection. They need it stat. Please fax [phone]

## 2013-11-19 NOTE — Telephone Encounter (Signed)
Faxed clearance for temporary anticoagulation discontinuation for pt to stop plavix x 7 days prior to injection to [facility] at [phone].

## 2013-12-01 ENCOUNTER — Other Ambulatory Visit: Payer: Self-pay

## 2013-12-01 MED ORDER — CARVEDILOL 6.25 MG PO TABS: 3.1250 mg | ORAL_TABLET | Freq: Two times a day (BID) | ORAL | Status: DC

## 2013-12-01 NOTE — Telephone Encounter (Signed)
Requested Prescriptions   Signed Prescriptions Disp Refills  . carvedilol (COREG) 6.25 MG tablet 90 tablet 3    Sig: Take 0.5 tablets (3.125 mg total) by mouth 2 (two) times daily with a meal.    Authorizing Provider: Antonieta IbaGOLLAN, TIMOTHY J    Ordering User: Kendrick FriesLOPEZ, MARINA C

## 2013-12-13 ENCOUNTER — Other Ambulatory Visit: Payer: Self-pay | Admitting: Cardiovascular Disease

## 2014-02-05 ENCOUNTER — Telehealth: Payer: Self-pay

## 2014-02-05 ENCOUNTER — Telehealth: Payer: Self-pay | Admitting: *Deleted

## 2014-02-05 NOTE — Telephone Encounter (Signed)
Dr. Maurice SmallIbazebo need clearance for a back injections  [phone]. He is having it done as soon as the clearance is done. Please do stat!

## 2014-02-05 NOTE — Telephone Encounter (Signed)
Pt called and states that his Orthopaedic Dr. Dewayne HatchWants him to stop his Plavix so he can have an injection. Please call.

## 2014-02-05 NOTE — Telephone Encounter (Signed)
Ok to hold plavix No recent stent

## 2014-02-05 NOTE — Telephone Encounter (Signed)
Ok to hold plavix 

## 2014-02-06 NOTE — Telephone Encounter (Signed)
Faxed clearance for epidural injection to [name] for pt to hold plavix 7 days prior to injection at [phone].

## 2014-02-06 NOTE — Telephone Encounter (Signed)
Faxed anticoagulation clearance for pt to hold plavix x 7 days to [facility] at [phone].

## 2014-03-20 ENCOUNTER — Other Ambulatory Visit: Payer: Self-pay | Admitting: Internal Medicine

## 2014-03-20 ENCOUNTER — Other Ambulatory Visit: Payer: Self-pay

## 2014-03-20 MED ORDER — NIACIN ER (ANTIHYPERLIPIDEMIC) 1000 MG PO TBCR: EXTENDED_RELEASE_TABLET | ORAL | Status: DC

## 2014-03-20 NOTE — Addendum Note (Signed)
Addended by: Noreene Larsson on: 03/20/2014 09:48 AM   Modules accepted: Orders

## 2014-03-25 ENCOUNTER — Encounter: Payer: Self-pay | Admitting: Cardiovascular Disease

## 2014-03-25 ENCOUNTER — Ambulatory Visit (INDEPENDENT_AMBULATORY_CARE_PROVIDER_SITE_OTHER): Payer: Medicare Other | Admitting: Cardiovascular Disease

## 2014-03-25 VITALS — BP 100/78 | HR 65 | Ht 68.0 in | Wt 182.2 lb

## 2014-03-25 DIAGNOSIS — E1149 Type 2 diabetes mellitus with other diabetic neurological complication: Secondary | ICD-10-CM

## 2014-03-25 DIAGNOSIS — E785 Hyperlipidemia, unspecified: Secondary | ICD-10-CM

## 2014-03-25 DIAGNOSIS — I10 Essential (primary) hypertension: Secondary | ICD-10-CM

## 2014-03-25 DIAGNOSIS — I25810 Atherosclerosis of coronary artery bypass graft(s) without angina pectoris: Secondary | ICD-10-CM

## 2014-03-25 NOTE — Assessment & Plan Note (Signed)
Cholesterol is at goal on the current lipid regimen. No changes to the medications were made.  

## 2014-03-25 NOTE — Progress Notes (Signed)
[name], male    DOB: 1934-07-03, 80 y.o.   MRN: [id number]  HPI Comments:  Andrew [name] is a very pleasant 80 year old gentleman with history of coronary artery disease, bypass in May 2010, hyperlipidemia, diabetes, hypertension, who presents for Routine evaluation.  History of a pinched nerve, back surgery 06/19/2013. He continues to have back pain, status post radiofrequency treatments x3 through [facility].  Biggest complaint is arthritis in his hips, back, knees. Denies any chest pain, shortness of breath. Otherwise relatively active with no complaints.  Blood pressure low in the past and lisinopril was held. He only takes Coreg.  No significant aerobic activity.   Most recent lab work shows total cholesterol 141, LDL 67   EKG shows normal sinus rhythm with rate 65 beats per minute with right bundle branch block.     Outpatient Encounter Prescriptions as of 03/25/2014  Medication Sig  . aspirin EC 81 MG tablet Take 81 mg by mouth daily.  Marland Kitchen atorvastatin (LIPITOR) 20 MG tablet TAKE 1 TABLET DAILY (PATIENT NEEDS OFFICE VISIT)  . carvedilol (COREG) 6.25 MG tablet Take 0.5 tablets (3.125 mg total) by mouth 2 (two) times daily with a meal.  . celecoxib (CELEBREX) 200 MG capsule Take 200 mg by mouth 2 (two) times daily.  . cholecalciferol (VITAMIN D) 1000 UNITS tablet Take 1,000 Units by mouth daily.  . clopidogrel (PLAVIX) 75 MG tablet Take 1 tablet (75 mg total) by mouth daily.  Marland Kitchen HYDROcodone-acetaminophen (NORCO/VICODIN) 5-325 MG per tablet Take 1 tablet by mouth every 6 (six) hours as needed.   . metFORMIN (GLUCOPHAGE-XR) 500 MG 24 hr tablet TAKE 2 TABLETS TWICE A DAY  . Multiple Vitamin (MULTIVITAMIN WITH MINERALS) TABS tablet Take 1 tablet by mouth daily.  . niacin (NIASPAN) 1000 MG CR tablet TAKE 1 TABLET AT BEDTIME  . tadalafil (CIALIS) 5 MG tablet Take 1 tablet (5 mg total) by mouth daily as needed for erectile dysfunction.    Review of Systems   Constitutional: Negative.   HENT: Negative.   Eyes: Negative.   Respiratory: Negative.   Cardiovascular: Negative.   Gastrointestinal: Negative.   Endocrine: Negative.   Musculoskeletal: Positive for back pain.  Skin: Negative.   Allergic/Immunologic: Negative.   Neurological: Negative.   Hematological: Negative.   Psychiatric/Behavioral: Negative.   All other systems reviewed and are negative.   BP 100/78  Pulse 65  Ht 5\' 8"  (1.727 m)  Wt 182 lb 4 oz (82.668 kg)  BMI 27.72 kg/m2  Physical Exam  Nursing note and vitals reviewed. Constitutional: He is oriented to person, place, and time. He appears well-developed and well-nourished.  HENT:  Head: Normocephalic.  Nose: Nose normal.  Mouth/Throat: Oropharynx is clear and moist.  Eyes: Conjunctivae are normal. Pupils are equal, round, and reactive to light.  Neck: Normal range of motion. Neck supple. No JVD present.  Cardiovascular: Normal rate, regular rhythm, S1 normal, S2 normal, normal heart sounds and intact distal pulses.  Exam reveals no gallop and no friction rub.   No murmur heard. Pulmonary/Chest: Effort normal and breath sounds normal. No respiratory distress. He has no wheezes. He has no rales. He exhibits no tenderness.  Abdominal: Soft. Bowel sounds are normal. He exhibits no distension. There is no tenderness.  Musculoskeletal: Normal range of motion. He exhibits no edema and no tenderness.  Lymphadenopathy:    He has no cervical adenopathy.  Neurological: He is alert and oriented to person, place, and time. Coordination normal.  Skin: Skin is warm and dry. No rash noted. No erythema.  Psychiatric: He has a normal mood and affect. His behavior is normal. Judgment and thought content normal.      Assessment and Plan

## 2014-03-25 NOTE — Patient Instructions (Signed)
You are doing well. No medication changes were made.  Please call us if you have new issues that need to be addressed before your next appt.  Your physician wants you to follow-up in: 6 months.  You will receive a reminder letter in the mail two months in advance. If you don't receive a letter, please call our office to schedule the follow-up appointment.   

## 2014-03-25 NOTE — Assessment & Plan Note (Signed)
We have encouraged continued exercise, careful diet management in an effort to lose weight. 

## 2014-03-25 NOTE — Assessment & Plan Note (Signed)
Blood pressure is well controlled on today's visit. No changes made to the medications. 

## 2014-03-25 NOTE — Assessment & Plan Note (Signed)
Currently with no symptoms of angina. No further workup at this time. Continue current medication regimen. 

## 2014-03-27 ENCOUNTER — Ambulatory Visit (INDEPENDENT_AMBULATORY_CARE_PROVIDER_SITE_OTHER): Payer: Medicare Other | Admitting: Internal Medicine

## 2014-03-27 ENCOUNTER — Other Ambulatory Visit (INDEPENDENT_AMBULATORY_CARE_PROVIDER_SITE_OTHER): Payer: Medicare Other

## 2014-03-27 ENCOUNTER — Encounter: Payer: Self-pay | Admitting: Internal Medicine

## 2014-03-27 VITALS — BP 118/78 | HR 62 | Temp 98.2°F | Wt 185.4 lb

## 2014-03-27 DIAGNOSIS — E1149 Type 2 diabetes mellitus with other diabetic neurological complication: Secondary | ICD-10-CM

## 2014-03-27 DIAGNOSIS — E785 Hyperlipidemia, unspecified: Secondary | ICD-10-CM

## 2014-03-27 DIAGNOSIS — I10 Essential (primary) hypertension: Secondary | ICD-10-CM

## 2014-03-27 LAB — HEMOGLOBIN A1C: Hgb A1c MFr Bld: 6.7 % — ABNORMAL HIGH (ref 4.6–6.5)

## 2014-03-27 NOTE — Progress Notes (Signed)
Subjective:    [name], male    DOB: 09-Apr-1934, 80 y.o.   MRN: [id number]  HPI  Patient here for follow up Reviewed chronic medical issues and interval medical events  Past Medical History  Diagnosis Date  . Coronary artery disease 02/2009    severe left main and three-vessel, CABG  . Colonic polyp 2011 last colo    colo q 51yr - [facility]   . Diverticulosis of colon   . DEGENERATIVE JOINT DISEASE   . VITAMIN D DEFICIENCY   . DIVERTICULOSIS, COLON   . HYPERLIPIDEMIA-MIXED   . HYPERTENSION, BENIGN   . Type II or unspecified type diabetes mellitus without mention of complication, not stated as uncontrolled   . LBP (low back pain)     ESI - [place]; RFA x 3 early 2015    Review of Systems  Constitutional: Negative for fever and fatigue.  Respiratory: Negative for cough and shortness of breath.   Cardiovascular: Negative for chest pain and leg swelling.  Musculoskeletal: Positive for back pain (chronic, working with ortho/pain mgmt on same).  Neurological: Negative for headaches.       Objective:   Physical Exam  BP 118/78  Pulse 62  Temp(Src) 98.2 F (36.8 C) (Oral)  Wt 185 lb 6.4 oz (84.097 kg)  SpO2 98% Wt Readings from Last 3 Encounters:  03/27/14 185 lb 6.4 oz (84.097 kg)  03/25/14 182 lb 4 oz (82.668 kg)  09/26/13 181 lb 8 oz (82.328 kg)   Constitutional: he appears well-developed and well-nourished. No distress.  Neck: Normal range of motion. Neck supple. No JVD present. No thyromegaly present.  Cardiovascular: Normal rate, regular rhythm and normal heart sounds.  No murmur heard. No BLE edema. Pulmonary/Chest: Effort normal and breath sounds normal. No respiratory distress. he has no wheezes.  Psychiatric: he has a normal mood and affect. His behavior is normal. Judgment and thought content normal.   Lab Results  Component Value Date   WBC 7.5 09/26/2013   HGB 12.9* 09/26/2013   HCT 38.2* 09/26/2013   PLT 209.0 09/26/2013   GLUCOSE 121*  09/26/2013   CHOL 141 09/26/2013   TRIG 115.0 09/26/2013   HDL 50.70 09/26/2013   LDLDIRECT 72.8 04/18/2011   LDLCALC 67 09/26/2013   ALT 21 09/26/2013   AST 19 09/26/2013   NA 139 09/26/2013   K 4.3 09/26/2013   CL 106 09/26/2013   CREATININE 1.4 09/26/2013   BUN 26* 09/26/2013   CO2 26 09/26/2013   TSH 1.07 04/18/2011   PSA 0.6 09/22/2010   INR 0.97 06/13/2013   HGBA1C 6.8* 09/26/2013   MICROALBUR 1.6 09/26/2013    Dg Chest 2 View  06/13/2013   *RADIOLOGY REPORT*  Clinical Data: Preoperative evaluation for lumbar surgery  CHEST - 2 VIEW  Comparison: 04/09/2009  Findings: Postsurgical changes are again noted.  The lungs are clear bilaterally.  No acute bony abnormality is noted.  IMPRESSION: No acute abnormalities seen.   Original Report Authenticated By: Alcide Clever, M.D.       Assessment & Plan:   Problem List Items Addressed This Visit   HYPERLIPIDEMIA-MIXED     On statin - tolerating well - cards changed cretstor to atorva 08/2012 for $ goal LDL<70 with CAD hx - check annually, last lipids reviewed The current medical regimen is effective;  continue present plan and medications.     HYPERTENSION, BENIGN      BP Readings from Last 3 Encounters:  03/27/14 118/78  03/25/14 100/78  09/26/13 112/70  ACEI stopped temporarily summer 2013 due to side effects The current medical regimen is effective;  continue present plan and medications.    Type II or unspecified type diabetes mellitus with neurological manifestations, uncontrolled(250.62) - Primary      Reports good control - on metformin (dose increased 03/2012) + statin and ASA  Suspect improved a1c with intentional weight loss efforts over early 2014 Off ACEI since summer 2013 -  Check a1c now, adjust as needed Lab Results  Component Value Date   HGBA1C 6.8* 09/26/2013      Relevant Orders      Hemoglobin A1c

## 2014-03-27 NOTE — Assessment & Plan Note (Signed)
BP Readings from Last 3 Encounters:  03/27/14 118/78  03/25/14 100/78  09/26/13 112/70  ACEI stopped temporarily summer 2013 due to side effects The current medical regimen is effective;  continue present plan and medications.

## 2014-03-27 NOTE — Assessment & Plan Note (Signed)
On statin - tolerating well - cards changed cretstor to atorva 08/2012 for $ goal LDL<70 with CAD hx - check annually, last lipids reviewed The current medical regimen is effective;  continue present plan and medications.

## 2014-03-27 NOTE — Patient Instructions (Addendum)
It was good to see you today.  We have reviewed your prior records including labs and tests today  Test(s) ordered today. Your results will be released to MyChart (or called to you) after review, usually within 72hours after test completion. If any changes need to be made, you will be notified at that same time.  Medications reviewed and updated, no changes recommended at this time.  Continue working with your specialists as ongoing  Please schedule followup in 6 months for annual exam and labs, call sooner if problems.   Diabetes and Small Vessel Disease Small vessel disease (microvascular disease) includes nephropathy, retinopathy, and neuropathy. People with diabetes are at risk for these problems, but keeping blood glucose (sugar) controlled is helpful in preventing problems. DIABETIC KIDNEY PROBLEMS (DIABETIC NEPHROPATHY)  Diabetic nephropathy occurs in many patients with diabetes.  Damage to the small vessels in the kidneys is the leading cause of end-stage renal disease (ESRD).  Protein in the urine (albuminuria) in the range of 30 to 300 mg/24 h (microalbuminuria) is a sign of the earliest stage of diabetic nephropathy.  Good blood glucose (sugar) and blood pressure control significantly reduce the progression of nephropathy. DIABETIC EYE PROBLEMS (DIABETIC RETINOPATHY)  Diabetic retinopathy is the most common cause of new cases of blindness in adults. It is related to the number of years you have had diabetes.  Common risk factors include high blood sugar (hyperglycemia), high blood pressure (hypertension), and poorly controlled blood lipids such as high blood cholesterol (hypercholesterolemia). DIABETIC NERVE PROBLEMS (DIABETIC NEUROPATHY) Diabetic neuropathy is the most common, long-term complication of diabetes. It is responsible for more than half of leg amputations not due to accidents. The main risk for developing diabetic neuropathy seems to be uncontrolled blood sugars.  Hyperglycemia damages the nerve fibers causing sensation (feeling) problems. The closer you can keep the following guidelines, the better chance you will have avoiding problems from small vessel disease.  Working toward near normal blood glucose or as normal as possible. You will need to keep your blood glucose and A1c at the target range prescribed by your caregiver.  Keep your blood pressure less than 120/80.  Keep your low-density lipoprotein (LDL) cholesterol (one of the fats in your blood) at less than 100 mg/dL. An LDL less than 70 mg/dL may be recommended for high risk patients. You cannot change your family history, but it is important to change the risk factors that you can. Risk factors you can control include:  Controlling high blood pressure.  Stopping smoking.  Using alcohol only in moderation. Generally, this means about one drink per day for women and two drinks per day for men.  Controlling your blood lipids (cholesterol and triglycerides).  Treating heart problems, if these are contributing to risk. SEEK MEDICAL CARE IF:   You are having problems keeping your blood glucose in goal range.  You notice a change in your vision or new problems with your vision.  You have wound or sore that does not heal.  Your blood pressure is above the target range. Document Released: 10/12/2003 Document Revised: 09/25/2012 Document Reviewed: 03/19/2009 [facility] Patient Information 2014 [place], [place].

## 2014-03-27 NOTE — Progress Notes (Signed)
Pre visit review using our clinic review tool, if applicable. No additional management support is needed unless otherwise documented below in the visit note. 

## 2014-03-27 NOTE — Assessment & Plan Note (Signed)
Reports good control - on metformin (dose increased 03/2012) + statin and ASA  Suspect improved a1c with intentional weight loss efforts over early 2014 Off ACEI since summer 2013 -  Check a1c now, adjust as needed Lab Results  Component Value Date   HGBA1C 6.8* 09/26/2013

## 2014-04-22 NOTE — Telephone Encounter (Signed)
This encounter was created in error - please disregard.

## 2014-06-09 ENCOUNTER — Telehealth: Payer: Self-pay

## 2014-06-09 NOTE — Telephone Encounter (Signed)
Faxed [facility] to [facility] for pt to proceed w/ injection procedure and to hold Plavix x 10 days.

## 2014-07-15 ENCOUNTER — Telehealth: Payer: Self-pay

## 2014-07-15 NOTE — Telephone Encounter (Signed)
Faxed clearance for pt to hold Plavix 7 days prior to injection at [facility], but to continue his aspirin, per Eula Listen, PA. Faxed to [phone].

## 2014-08-28 ENCOUNTER — Other Ambulatory Visit: Payer: Self-pay | Admitting: Internal Medicine

## 2014-08-31 ENCOUNTER — Other Ambulatory Visit: Payer: Self-pay

## 2014-08-31 MED ORDER — NIACIN ER (ANTIHYPERLIPIDEMIC) 1000 MG PO TBCR: EXTENDED_RELEASE_TABLET | ORAL | Status: DC

## 2014-09-08 LAB — HM DIABETES EYE EXAM

## 2014-09-15 ENCOUNTER — Encounter: Payer: Self-pay | Admitting: Internal Medicine

## 2014-09-21 ENCOUNTER — Other Ambulatory Visit: Payer: Self-pay | Admitting: Cardiovascular Disease

## 2014-09-28 ENCOUNTER — Ambulatory Visit (INDEPENDENT_AMBULATORY_CARE_PROVIDER_SITE_OTHER): Payer: Medicare Other | Admitting: Internal Medicine

## 2014-09-28 ENCOUNTER — Other Ambulatory Visit (INDEPENDENT_AMBULATORY_CARE_PROVIDER_SITE_OTHER): Payer: Medicare Other

## 2014-09-28 ENCOUNTER — Encounter: Payer: Self-pay | Admitting: Internal Medicine

## 2014-09-28 VITALS — BP 112/66 | HR 68 | Temp 97.5°F | Ht 68.0 in | Wt 185.1 lb

## 2014-09-28 DIAGNOSIS — E1149 Type 2 diabetes mellitus with other diabetic neurological complication: Secondary | ICD-10-CM

## 2014-09-28 DIAGNOSIS — Z0000 Encounter for general adult medical examination without abnormal findings: Secondary | ICD-10-CM

## 2014-09-28 DIAGNOSIS — E1140 Type 2 diabetes mellitus with diabetic neuropathy, unspecified: Secondary | ICD-10-CM

## 2014-09-28 LAB — BASIC METABOLIC PANEL
BUN: 30 mg/dL — AB (ref 6–23)
CALCIUM: 9.5 mg/dL (ref 8.4–10.5)
CO2: 25 mEq/L (ref 19–32)
Chloride: 107 mEq/L (ref 96–112)
Creatinine, Ser: 1.2 mg/dL (ref 0.4–1.5)
GFR: 59.88 mL/min — AB (ref >60.00)
GLUCOSE: 195 mg/dL — AB (ref 70–99)
Potassium: 4.7 mEq/L (ref 3.5–5.1)
Sodium: 140 mEq/L (ref 135–145)

## 2014-09-28 LAB — LIPID PANEL
Cholesterol: 146 mg/dL (ref 0–200)
HDL: 50 mg/dL (ref >39.00)
LDL Cholesterol: 64 mg/dL (ref 0–99)
NONHDL: 96
Total CHOL/HDL Ratio: 3
Triglycerides: 158 mg/dL — ABNORMAL HIGH (ref 0.0–149.0)
VLDL: 31.6 mg/dL (ref 0.0–40.0)

## 2014-09-28 LAB — HEMOGLOBIN A1C: Hgb A1c MFr Bld: 6.7 % — ABNORMAL HIGH (ref 4.6–6.5)

## 2014-09-28 LAB — MICROALBUMIN / CREATININE URINE RATIO
Creatinine,U: 233.6 mg/dL
MICROALB/CREAT RATIO: 0.3 mg/g (ref 0.0–30.0)
Microalb, Ur: 0.6 mg/dL (ref 0.0–1.9)

## 2014-09-28 MED ORDER — NIACIN ER (ANTIHYPERLIPIDEMIC) 1000 MG PO TBCR: 1000.0000 mg | EXTENDED_RELEASE_TABLET | Freq: Every day | ORAL | Status: DC

## 2014-09-28 MED ORDER — ATORVASTATIN CALCIUM 20 MG PO TABS: 20.0000 mg | ORAL_TABLET | Freq: Every day | ORAL | Status: DC

## 2014-09-28 MED ORDER — METFORMIN HCL ER 500 MG PO TB24: 1000.0000 mg | ORAL_TABLET | Freq: Two times a day (BID) | ORAL | Status: DC

## 2014-09-28 MED ORDER — CLOPIDOGREL BISULFATE 75 MG PO TABS: 75.0000 mg | ORAL_TABLET | Freq: Every day | ORAL | Status: DC

## 2014-09-28 NOTE — Progress Notes (Signed)
Pre visit review using our clinic review tool, if applicable. No additional management support is needed unless otherwise documented below in the visit note. 

## 2014-09-28 NOTE — Progress Notes (Signed)
Subjective:    [name], male    DOB: 04/19/1934, 80 y.o.   MRN: [id number]  HPI   Here for medicare wellness  Diet: heart healthy, carb mod Physical activity: sedentary Depression/mood screen: negative Hearing: intact to whispered voice Visual acuity: grossly normal, performs annual eye exam  ADLs: capable Fall risk: none Home safety: good Cognitive evaluation: intact to orientation, naming, recall and repetition EOL planning: adv directives, full code/ I agree  I have personally reviewed and have noted 1. The patient's medical and social history 2. Their use of alcohol, tobacco or illicit drugs 3. Their current medications and supplements 4. The patient's functional ability including ADL's, fall risks, home safety risks and hearing or visual impairment. 5. Diet and physical activities 6. Evidence for depression or mood disorders  Also reviewed chronic medical issues and interval medical events  Past Medical History  Diagnosis Date  . Coronary artery disease 02/2009    severe left main and three-vessel, CABG  . Colonic polyp 2011 last colo    colo q 8671yr - [facility]   . Diverticulosis of colon   . DEGENERATIVE JOINT DISEASE   . VITAMIN D DEFICIENCY   . DIVERTICULOSIS, COLON   . HYPERLIPIDEMIA-MIXED   . HYPERTENSION, BENIGN   . Type II or unspecified type diabetes mellitus without mention of complication, not stated as uncontrolled   . LBP (low back pain)     [facility] - [place]; RFA x 3 early 2015   Family History  Problem Relation Age of Onset  . Heart failure Mother     died age 78  . Heart failure Father     died age 78  . Heart disease Neg Hx   . Diabetes Other    History  Substance Use Topics  . Smoking status: Never Smoker   . Smokeless tobacco: Never Used     Comment: Married, 3 grown children. Retired 04/2002 from [employer] work  . Alcohol Use: Yes   Review of Systems  Constitutional: Negative for fever, activity change,  appetite change, fatigue and unexpected weight change.  Respiratory: Negative for cough, chest tightness, shortness of breath and wheezing.   Cardiovascular: Negative for chest pain, palpitations and leg swelling.  Musculoskeletal: Positive for back pain (chronic, working with PM&R with [facility] for same).  Neurological: Negative for dizziness, weakness and headaches.  Psychiatric/Behavioral: Negative for dysphoric mood. The patient is not nervous/anxious.   All other systems reviewed and are negative.      Objective:   Physical Exam  BP 112/66 mmHg  Pulse 68  Temp(Src) 97.5 F (36.4 C) (Oral)  Ht 5\' 8"  (1.727 m)  Wt 185 lb 2 oz (83.972 kg)  BMI 28.15 kg/m2  SpO2 96% Wt Readings from Last 3 Encounters:  09/28/14 185 lb 2 oz (83.972 kg)  03/27/14 185 lb 6.4 oz (84.097 kg)  03/25/14 182 lb 4 oz (82.668 kg)   Constitutional: he appears well-developed and well-nourished. No distress.  Neck: Normal range of motion. Neck supple. No JVD present. No thyromegaly present.  Cardiovascular: Normal rate, regular rhythm and normal heart sounds.  No murmur heard. No BLE edema. Pulmonary/Chest: Effort normal and breath sounds normal. No respiratory distress. he has no wheezes.  Psychiatric: he has a normal mood and affect. His behavior is normal. Judgment and thought content normal.   Lab Results  Component Value Date   WBC 7.5 09/26/2013   HGB 12.9* 09/26/2013   HCT 38.2* 09/26/2013  PLT 209.0 09/26/2013   GLUCOSE 121* 09/26/2013   CHOL 141 09/26/2013   TRIG 115.0 09/26/2013   HDL 50.70 09/26/2013   LDLDIRECT 72.8 04/18/2011   LDLCALC 67 09/26/2013   ALT 21 09/26/2013   AST 19 09/26/2013   NA 139 09/26/2013   K 4.3 09/26/2013   CL 106 09/26/2013   CREATININE 1.4 09/26/2013   BUN 26* 09/26/2013   CO2 26 09/26/2013   TSH 1.07 04/18/2011   PSA 0.6 09/22/2010   INR 0.97 06/13/2013   HGBA1C 6.7* 03/27/2014   MICROALBUR 1.6 09/26/2013    Dg Chest 2 View  06/13/2013   *RADIOLOGY  REPORT*  Clinical Data: Preoperative evaluation for lumbar surgery  CHEST - 2 VIEW  Comparison: 04/09/2009  Findings: Postsurgical changes are again noted.  The lungs are clear bilaterally.  No acute bony abnormality is noted.  IMPRESSION: No acute abnormalities seen.   Original Report Authenticated By: Alcide CleverMark Lukens, M.D.       Assessment & Plan:   AWV/z00.00 - Today patient counseled on age appropriate routine health concerns for screening and prevention, each reviewed and up to date or declined. Immunizations reviewed and up to date or declined. Labs ordered and reviewed. Risk factors for depression reviewed and negative. Hearing function and visual acuity are intact. ADLs screened and addressed as needed. Functional ability and level of safety reviewed and appropriate. Education, counseling and referrals performed based on assessed risks today. Patient provided with a copy of personalized plan for preventive services.  Problem List Items Addressed This Visit    Type 2 diabetes mellitus with neurological manifestations, controlled    Reports good control - on metformin (dose increased 03/2012) + statin and ASA  Suspect improved a1c with intentional weight loss efforts over early 2014 Off ACEI since summer 2013 -  Check a1c now, adjust as needed Lab Results  Component Value Date   HGBA1C 6.7* 03/27/2014      Relevant Medications      metFORMIN (GLUCOPHAGE-XR) 24 hr tablet      atorvastatin (LIPITOR) tablet   Other Relevant Orders      Basic metabolic panel      Lipid panel      Hemoglobin A1c      Microalbumin / creatinine urine ratio    Other Visit Diagnoses    Routine general medical examination at a health care facility    -  Primary

## 2014-09-28 NOTE — Patient Instructions (Addendum)
It was good to see you today.  We have reviewed your prior records including labs and tests today  Health Maintenance reviewed - all recommended immunizations and age-appropriate screenings are up-to-date.  Test(s) ordered today. Your results will be released to [place] (or called to you) after review, usually within 72hours after test completion. If any changes need to be made, you will be notified at that same time.  Medications reviewed and updated, no changes recommended at this time.  Please schedule followup in 6 months for semannual exam and labs, call sooner if problems.   Health Maintenance A healthy lifestyle and preventative care can promote health and wellness.  Maintain regular health, dental, and eye exams.  Eat a healthy diet. Foods like vegetables, fruits, whole grains, low-fat dairy products, and lean protein foods contain the nutrients you need and are low in calories. Decrease your intake of foods high in solid fats, added sugars, and salt. Get information about a proper diet from your health care provider, if necessary.  Regular physical exercise is one of the most important things you can do for your health. Most adults should get at least 150 minutes of moderate-intensity exercise (any activity that increases your heart rate and causes you to sweat) each week. In addition, most adults need muscle-strengthening exercises on 2 or more days a week.   Maintain a healthy weight. The body mass index (BMI) is a screening tool to identify possible weight problems. It provides an estimate of body fat based on height and weight. Your health care provider can find your BMI and can help you achieve or maintain a healthy weight. For males 20 years and older:  A BMI below 18.5 is considered underweight.  A BMI of 18.5 to 24.9 is normal.  A BMI of 25 to 29.9 is considered overweight.  A BMI of 30 and above is considered obese.  Maintain normal blood lipids and cholesterol by  exercising and minimizing your intake of saturated fat. Eat a balanced diet with plenty of fruits and vegetables. Blood tests for lipids and cholesterol should begin at age 19 and be repeated every 5 years. If your lipid or cholesterol levels are high, you are over age 46, or you are at high risk for heart disease, you may need your cholesterol levels checked more frequently.Ongoing high lipid and cholesterol levels should be treated with medicines if diet and exercise are not working.  If you smoke, find out from your health care provider how to quit. If you do not use tobacco, do not start.  Lung cancer screening is recommended for adults aged 38-80 years who are at high risk for developing lung cancer because of a history of smoking. A yearly low-dose CT scan of the lungs is recommended for people who have at least a 30-pack-year history of smoking and are current smokers or have quit within the past 15 years. A pack year of smoking is smoking an average of 1 pack of cigarettes a day for 1 year (for example, a 30-pack-year history of smoking could mean smoking 1 pack a day for 30 years or 2 packs a day for 15 years). Yearly screening should continue until the smoker has stopped smoking for at least 15 years. Yearly screening should be stopped for people who develop a health problem that would prevent them from having lung cancer treatment.  If you choose to drink alcohol, do not have more than 2 drinks per day. One drink is considered to be 12  oz (360 mL) of beer, 5 oz (150 mL) of wine, or 1.5 oz (45 mL) of liquor.  Avoid the use of street drugs. Do not share needles with anyone. Ask for help if you need support or instructions about stopping the use of drugs.  High blood pressure causes heart disease and increases the risk of stroke. Blood pressure should be checked at least every 1-2 years. Ongoing high blood pressure should be treated with medicines if weight loss and exercise are not  effective.  If you are 17-82 years old, ask your health care provider if you should take aspirin to prevent heart disease.  Diabetes screening involves taking a blood sample to check your fasting blood sugar level. This should be done once every 3 years after age 55 if you are at a normal weight and without risk factors for diabetes. Testing should be considered at a younger age or be carried out more frequently if you are overweight and have at least 1 risk factor for diabetes.  Colorectal cancer can be detected and often prevented. Most routine colorectal cancer screening begins at the age of 17 and continues through age 80. However, your health care provider may recommend screening at an earlier age if you have risk factors for colon cancer. On a yearly basis, your health care provider may provide home test kits to check for hidden blood in the stool. A small camera at the end of a tube may be used to directly examine the colon (sigmoidoscopy or colonoscopy) to detect the earliest forms of colorectal cancer. Talk to your health care provider about this at age 55 when routine screening begins. A direct exam of the colon should be repeated every 5-10 years through age 54, unless early forms of precancerous polyps or small growths are found.  People who are at an increased risk for hepatitis B should be screened for this virus. You are considered at high risk for hepatitis B if:  You were born in a country where hepatitis B occurs often. Talk with your health care provider about which countries are considered high risk.  Your parents were born in a high-risk country and you have not received a shot to protect against hepatitis B (hepatitis B vaccine).  You have HIV or AIDS.  You use needles to inject street drugs.  You live with, or have sex with, someone who has hepatitis B.  You are a man who has sex with other men (MSM).  You get hemodialysis treatment.  You take certain medicines for  conditions like cancer, organ transplantation, and autoimmune conditions.  Hepatitis C blood testing is recommended for all people born from [birth year] through 1963 and any individual with known risk factors for hepatitis C.  Healthy men should no longer receive prostate-specific antigen (PSA) blood tests as part of routine cancer screening. Talk to your health care provider about prostate cancer screening.  Testicular cancer screening is not recommended for adolescents or adult males who have no symptoms. Screening includes self-exam, a health care provider exam, and other screening tests. Consult with your health care provider about any symptoms you have or any concerns you have about testicular cancer.  Practice safe sex. Use condoms and avoid high-risk sexual practices to reduce the spread of sexually transmitted infections (STIs).  You should be screened for STIs, including gonorrhea and chlamydia if:  You are sexually active and are younger than 24 years.  You are older than 24 years, and your health care provider tells  you that you are at risk for this type of infection.  Your sexual activity has changed since you were last screened, and you are at an increased risk for chlamydia or gonorrhea. Ask your health care provider if you are at risk.  If you are at risk of being infected with HIV, it is recommended that you take a prescription medicine daily to prevent HIV infection. This is called pre-exposure prophylaxis (PrEP). You are considered at risk if:  You are a man who has sex with other men (MSM).  You are a heterosexual man who is sexually active with multiple partners.  You take drugs by injection.  You are sexually active with a partner who has HIV.  Talk with your health care provider about whether you are at high risk of being infected with HIV. If you choose to begin PrEP, you should first be tested for HIV. You should then be tested every 3 months for as long as you are taking  PrEP.  Use sunscreen. Apply sunscreen liberally and repeatedly throughout the day. You should seek shade when your shadow is shorter than you. Protect yourself by wearing long sleeves, pants, a wide-brimmed hat, and sunglasses year round whenever you are outdoors.  Tell your health care provider of new moles or changes in moles, especially if there is a change in shape or color. Also, tell your health care provider if a mole is larger than the size of a pencil eraser.  A one-time screening for abdominal aortic aneurysm (AAA) and surgical repair of large AAAs by ultrasound is recommended for men aged 75-75 years who are current or former smokers.  Stay current with your vaccines (immunizations). Document Released: 04/06/2008 Document Revised: 10/14/2013 Document Reviewed: 03/06/2011 [facility] Patient Information 2015 [place], [place]. This information is not intended to replace advice given to you by your health care provider. Make sure you discuss any questions you have with your health care provider. Diabetes and Standards of Medical Care Diabetes is complicated. You may find that your diabetes team includes a dietitian, nurse, diabetes educator, eye doctor, and more. To help everyone know what is going on and to help you get the care you deserve, the following schedule of care was developed to help keep you on track. Below are the tests, exams, vaccines, medicines, education, and plans you will need. HbA1c test This test shows how well you have controlled your glucose over the past 2-3 months. It is used to see if your diabetes management plan needs to be adjusted.   It is performed at least 2 times a year if you are meeting treatment goals.  It is performed 4 times a year if therapy has changed or if you are not meeting treatment goals. Blood pressure test  This test is performed at every routine medical visit. The goal is less than 140/90 mm Hg for most people, but 130/80 mm Hg in some cases.  Ask your health care provider about your goal. Dental exam  Follow up with the dentist regularly. Eye exam  If you are diagnosed with type 1 diabetes as a child, get an exam upon reaching the age of 46 years or older and have had diabetes for 3-5 years. Yearly eye exams are recommended after that initial eye exam.  If you are diagnosed with type 1 diabetes as an adult, get an exam within 5 years of diagnosis and then yearly.  If you are diagnosed with type 2 diabetes, get an exam as soon as  possible after the diagnosis and then yearly. Foot care exam  Visual foot exams are performed at every routine medical visit. The exams check for cuts, injuries, or other problems with the feet.  A comprehensive foot exam should be done yearly. This includes visual inspection as well as assessing foot pulses and testing for loss of sensation.  Check your feet nightly for cuts, injuries, or other problems with your feet. Tell your health care provider if anything is not healing. Kidney function test (urine microalbumin)  This test is performed once a year.  Type 1 diabetes: The first test is performed 5 years after diagnosis.  Type 2 diabetes: The first test is performed at the time of diagnosis.  A serum creatinine and estimated glomerular filtration rate (eGFR) test is done once a year to assess the level of chronic kidney disease (CKD), if present. Lipid profile (cholesterol, HDL, LDL, triglycerides)  Performed every 5 years for most people.  The goal for LDL is less than 100 mg/dL. If you are at high risk, the goal is less than 70 mg/dL.  The goal for HDL is 40 mg/dL-50 mg/dL for men and 50 mg/dL-60 mg/dL for women. An HDL cholesterol of 60 mg/dL or higher gives some protection against heart disease.  The goal for triglycerides is less than 150 mg/dL. Influenza vaccine, pneumococcal vaccine, and hepatitis B vaccine  The influenza vaccine is recommended yearly.  It is recommended that  people with diabetes who are over 57 years old get the pneumonia vaccine. In some cases, two separate shots may be given. Ask your health care provider if your pneumonia vaccination is up to date.  The hepatitis B vaccine is also recommended for adults with diabetes. Diabetes self-management education  Education is recommended at diagnosis and ongoing as needed. Treatment plan  Your treatment plan is reviewed at every medical visit. Document Released: 08/06/2009 Document Revised: 02/23/2014 Document Reviewed: 03/11/2013 [facility] Patient Information 2015 [place], [place]. This information is not intended to replace advice given to you by your health care provider. Make sure you discuss any questions you have with your health care provider.

## 2014-09-28 NOTE — Assessment & Plan Note (Signed)
Reports good control - on metformin (dose increased 03/2012) + statin and ASA  Suspect improved a1c with intentional weight loss efforts over early 2014 Off ACEI since summer 2013 -  Check a1c now, adjust as needed Lab Results  Component Value Date   HGBA1C 6.7* 03/27/2014

## 2014-10-02 ENCOUNTER — Ambulatory Visit (INDEPENDENT_AMBULATORY_CARE_PROVIDER_SITE_OTHER): Payer: Medicare Other | Admitting: Cardiovascular Disease

## 2014-10-02 ENCOUNTER — Encounter: Payer: Self-pay | Admitting: Cardiovascular Disease

## 2014-10-02 VITALS — BP 110/60 | HR 71 | Ht 68.0 in | Wt 185.2 lb

## 2014-10-02 DIAGNOSIS — M79602 Pain in left arm: Secondary | ICD-10-CM

## 2014-10-02 DIAGNOSIS — E1149 Type 2 diabetes mellitus with other diabetic neurological complication: Secondary | ICD-10-CM

## 2014-10-02 DIAGNOSIS — E785 Hyperlipidemia, unspecified: Secondary | ICD-10-CM

## 2014-10-02 DIAGNOSIS — E1140 Type 2 diabetes mellitus with diabetic neuropathy, unspecified: Secondary | ICD-10-CM

## 2014-10-02 DIAGNOSIS — M1990 Unspecified osteoarthritis, unspecified site: Secondary | ICD-10-CM

## 2014-10-02 DIAGNOSIS — I25709 Atherosclerosis of coronary artery bypass graft(s), unspecified, with unspecified angina pectoris: Secondary | ICD-10-CM

## 2014-10-02 DIAGNOSIS — I10 Essential (primary) hypertension: Secondary | ICD-10-CM

## 2014-10-02 DIAGNOSIS — I951 Orthostatic hypotension: Secondary | ICD-10-CM

## 2014-10-02 NOTE — Assessment & Plan Note (Signed)
Still continues to have significant back, hip discomfort.

## 2014-10-02 NOTE — Assessment & Plan Note (Signed)
Diabetes reasonably well controlled, hemoglobin A1c 6.7 We have encouraged continued exercise, careful diet management in an effort to lose weight.

## 2014-10-02 NOTE — Assessment & Plan Note (Signed)
Cholesterol is at goal on the current lipid regimen. No changes to the medications were made.  

## 2014-10-02 NOTE — Assessment & Plan Note (Signed)
Blood pressure is well controlled on today's visit. No changes made to the medications. 

## 2014-10-02 NOTE — Progress Notes (Signed)
[name], male    DOB: [birth year], 80 y.o.   MRN: [id number]  HPI Comments:  Andrew [name] is a very pleasant 80 year old gentleman with history of coronary artery disease, bypass in May 2010, hyperlipidemia, diabetes, hypertension, who presents for follow-up of his coronary artery disease  In follow-up today, he reports that he is doing well. He denies having any significant shortness of breath or chest pain. He does have some discomfort in his left arm when he holds his hands upwards No significant symptoms with exertion. He does not do a regular exercise program Reports blood pressure is stable History of a pinched nerve, back surgery 06/19/2013. He continues to have back pain, status post [facility].  Biggest complaint is arthritis in his hips, back, knees. Denies any chest pain, shortness of breath. Otherwise relatively active with no complaints. He reports that he has tried Cialis and Viagra in the past for erectile dysfunction. This did not work for him  EKG on today's visit shows normal sinus rhythm with rate 71 bpm, right bundle branch block  Most recent lab work shows total cholesterol 141, LDL 67    Allergies  -- Penicillins -- Other (See Comments)   --  Taste buds  Outpatient Encounter Prescriptions as of 10/02/2014: aspirin EC 81 MG tablet, Take 81 mg by mouth daily. atorvastatin (LIPITOR) 20 MG tablet, Take 1 tablet (20 mg total) by mouth daily at 6 PM. carvedilol (COREG) 6.25 MG tablet, Take 0.5 tablets (3.125 mg total) by mouth 2 (two) times daily with a meal. celecoxib (CELEBREX) 200 MG capsule, Take 200 mg by mouth 2 (two) times daily. cholecalciferol (VITAMIN D) 1000 UNITS tablet, Take 1,000 Units by mouth daily. clopidogrel (PLAVIX) 75 MG tablet, Take 1 tablet (75 mg total) by mouth daily. HYDROcodone-acetaminophen (NORCO/VICODIN) 5-325 MG per tablet, Take 1 tablet by mouth every 6 (six) hours as needed.   metFORMIN (GLUCOPHAGE-XR) 500 MG 24 hr tablet, Take 2 tablets (1,000 mg total) by mouth 2 (two) times daily. Multiple Vitamin (MULTIVITAMIN WITH MINERALS) TABS tablet, Take 1 tablet by mouth daily. niacin (NIASPAN) 1000 MG CR tablet, Take 1 tablet (1,000 mg total) by mouth at bedtime.    Past Medical History:   Coronary artery disease                         02/2009         Comment:severe left main and three-vessel, CABG   Colonic polyp                                   2011 last*     Comment:colo q 6223yr - [facility]    Diverticulosis of colon                                      DEGENERATIVE JOINT DISEASE                                   VITAMIN D DEFICIENCY  DIVERTICULOSIS, COLON                                        HYPERLIPIDEMIA-MIXED                                         HYPERTENSION, BENIGN                                         Type II or unspecified type diabetes mellitus *              LBP (low back pain)                                            Comment:ESI - [place]; RFA x 3 early 2015  Past Surgical History:   KNEE ARTHROSCOPY                                 2007           Comment:right   LUMBAR EPIDURAL INJECTION                        2013           Comment:Has as needed.   CORONARY ARTERY BYPASS GRAFT                     03/17/09        Comment:x 4   INGUINAL HERNIA REPAIR                           1980         CARPAL TUNNEL RELEASE                           Right              TRIGGER FINGER RELEASE                          Right              LUMBAR LAMINECTOMY/DECOMPRESSION MICRODISCECTO* N/A 06/19/2013      Comment:Procedure: LUMBAR LAMINECTOMY/DECOMPRESSION               MICRODISCECTOMY;  Surgeon: Emilee Hero, MD;  Location: [facility] OR;  Service:               Orthopedics;  Laterality: N/A;  Lumbar 4-5               decompression   CATARACT EXTRACTION, BILATERAL                                  Comment:R  07/07/13, L 07/21/13  Social History  reports that he has never smoked. He has  never used smokeless tobacco. He reports that he drinks alcohol. He reports that he does not use illicit drugs.  Family History family history includes Diabetes in his other; Heart failure in his father and mother. There is no history of Heart disease.           Review of Systems  Constitutional: Negative.   HENT: Negative.   Eyes: Negative.   Respiratory: Negative.   Cardiovascular: Negative.   Gastrointestinal: Negative.   Endocrine: Negative.   Musculoskeletal: Positive for back pain.  Skin: Negative.   Allergic/Immunologic: Negative.   Neurological: Negative.   Hematological: Negative.   Psychiatric/Behavioral: Negative.   All other systems reviewed and are negative.   BP 110/60 mmHg  Pulse 71  Ht 5\' 8"  (1.727 m)  Wt 185 lb 4 oz (84.029 kg)  BMI 28.17 kg/m2  Physical Exam

## 2014-10-02 NOTE — Assessment & Plan Note (Signed)
Some dizziness, tolerating low-dose carvedilol Recommended he stay hydrated

## 2014-10-02 NOTE — Patient Instructions (Signed)
You are doing well. No medication changes were made.  Please call us if you have new issues that need to be addressed before your next appt.  Your physician wants you to follow-up in: 6 months.  You will receive a reminder letter in the mail two months in advance. If you don't receive a letter, please call our office to schedule the follow-up appointment.   

## 2014-10-02 NOTE — Assessment & Plan Note (Signed)
Currently with no symptoms of angina. No further workup at this time. Continue current medication regimen. 

## 2014-11-18 ENCOUNTER — Telehealth: Payer: Self-pay

## 2014-11-18 NOTE — Telephone Encounter (Signed)
Received cardiac clearance request for pt to proceed w/ lumbar epidural and hold Plavix. Per Eula Listenyan Dunn, PA, pt is cleared to proceed and may hold Plavix x 7 days prior to injection, but continue aspirin 81 mg. [facility] at [phone].

## 2014-12-15 ENCOUNTER — Other Ambulatory Visit: Payer: Self-pay | Admitting: Cardiovascular Disease

## 2015-01-11 ENCOUNTER — Telehealth: Payer: Self-pay

## 2015-01-11 NOTE — Telephone Encounter (Signed)
Received anticoagulation clearance request for pt to stop Plavix & asa prior to Lumbar Facet Injections. Per Eula Listenyan Dunn, PA, pt is cleared "may hold Plavix x 7 days prior to procedure.  Continue aspirin 81 mg.  Restart Plavix when ok by ortho." [facility] at [phone].

## 2015-01-24 ENCOUNTER — Other Ambulatory Visit: Payer: Self-pay | Admitting: Internal Medicine

## 2015-03-23 ENCOUNTER — Encounter: Payer: Self-pay | Admitting: Internal Medicine

## 2015-03-23 ENCOUNTER — Other Ambulatory Visit (INDEPENDENT_AMBULATORY_CARE_PROVIDER_SITE_OTHER): Payer: Medicare Other

## 2015-03-23 ENCOUNTER — Ambulatory Visit (INDEPENDENT_AMBULATORY_CARE_PROVIDER_SITE_OTHER): Payer: Medicare Other | Admitting: Internal Medicine

## 2015-03-23 VITALS — BP 122/72 | HR 82 | Temp 97.7°F | Ht 68.0 in | Wt 177.0 lb

## 2015-03-23 DIAGNOSIS — E1149 Type 2 diabetes mellitus with other diabetic neurological complication: Secondary | ICD-10-CM

## 2015-03-23 DIAGNOSIS — M51379 Other intervertebral disc degeneration, lumbosacral region without mention of lumbar back pain or lower extremity pain: Secondary | ICD-10-CM

## 2015-03-23 DIAGNOSIS — E785 Hyperlipidemia, unspecified: Secondary | ICD-10-CM

## 2015-03-23 DIAGNOSIS — E559 Vitamin D deficiency, unspecified: Secondary | ICD-10-CM

## 2015-03-23 DIAGNOSIS — E1140 Type 2 diabetes mellitus with diabetic neuropathy, unspecified: Secondary | ICD-10-CM

## 2015-03-23 DIAGNOSIS — I10 Essential (primary) hypertension: Secondary | ICD-10-CM

## 2015-03-23 DIAGNOSIS — M5137 Other intervertebral disc degeneration, lumbosacral region: Secondary | ICD-10-CM

## 2015-03-23 LAB — LIPID PANEL
CHOL/HDL RATIO: 3
Cholesterol: 151 mg/dL (ref 0–200)
HDL: 51.7 mg/dL (ref >39.00)
LDL Cholesterol: 75 mg/dL (ref 0–99)
NONHDL: 99.3
Triglycerides: 123 mg/dL (ref 0.0–149.0)
VLDL: 24.6 mg/dL (ref 0.0–40.0)

## 2015-03-23 LAB — HEMOGLOBIN A1C: Hgb A1c MFr Bld: 6.3 % (ref 4.6–6.5)

## 2015-03-23 LAB — VITAMIN D 25 HYDROXY (VIT D DEFICIENCY, FRACTURES): VITD: 35.94 ng/mL (ref 30.00–100.00)

## 2015-03-23 MED ORDER — METFORMIN HCL ER 500 MG PO TB24: 500.0000 mg | ORAL_TABLET | Freq: Two times a day (BID) | ORAL | Status: DC

## 2015-03-23 MED ORDER — CARVEDILOL 6.25 MG PO TABS: 3.1250 mg | ORAL_TABLET | Freq: Two times a day (BID) | ORAL | Status: DC

## 2015-03-23 NOTE — Addendum Note (Signed)
Addended by: Rene PaciLESCHBER, Feliz Lincoln A on: 03/23/2015 12:19 PM   Modules accepted: Orders

## 2015-03-23 NOTE — Assessment & Plan Note (Signed)
Reports good control - on metformin (dose increased 03/2012) + statin and ASA  Suspect improved a1c with intentional weight loss efforts over early 2014 Off ACEI since summer 2013 -  Check a1c now, adjust as needed Lab Results  Component Value Date   HGBA1C 6.7* 09/28/2014

## 2015-03-23 NOTE — Assessment & Plan Note (Signed)
Hx low levels recheck level now Ongoing OTC replacement, increase dose as needed

## 2015-03-23 NOTE — Progress Notes (Signed)
Pre visit review using our clinic review tool, if applicable. No additional management support is needed unless otherwise documented below in the visit note. 

## 2015-03-23 NOTE — Assessment & Plan Note (Signed)
Interval history reviewed Continue ongoing orthopedic care and pain management as reviewed

## 2015-03-23 NOTE — Assessment & Plan Note (Signed)
On statin - tolerating well - cards changed crestor to atorva 08/2012 for $ Also on niacin goal LDL<70 with CAD hx - check semiannually, last lipids reviewed The current medical regimen is effective;  continue present plan and medications.

## 2015-03-23 NOTE — Assessment & Plan Note (Signed)
BP Readings from Last 3 Encounters:  03/23/15 122/72  10/02/14 110/60  09/28/14 112/66  ACEI stopped summer 2013 due to side effects The current medical regimen is effective;  continue present plan and medications.

## 2015-03-23 NOTE — Progress Notes (Signed)
Subjective:    [name], male    DOB: 04-06-1934, 81 y.o.   MRN: [id number]  HPI  Patient here for followup Reviewed chronic conditions, inteval events and current concerns  Past Medical History  Diagnosis Date  . Coronary artery disease 02/2009    severe left main and three-vessel, CABG  . Colonic polyp 2011 last colo    colo q 40yr - [facility]   . Diverticulosis of colon   . DEGENERATIVE JOINT DISEASE   . VITAMIN D DEFICIENCY   . DIVERTICULOSIS, COLON   . HYPERLIPIDEMIA-MIXED   . HYPERTENSION, BENIGN   . Type II or unspecified type diabetes mellitus without mention of complication, not stated as uncontrolled   . LBP (low back pain)     s/p decompression 8/14; ESI - [place]; RFA x 3 early 2015    Review of Systems  Constitutional: Negative for fever and unexpected weight change.  Respiratory: Negative for cough and shortness of breath.   Cardiovascular: Negative for chest pain and leg swelling.  Musculoskeletal: Positive for back pain (chronic, follows with ortho for same and periodic ESI for same) and arthralgias. Negative for gait problem.       Objective:    Physical Exam  Constitutional: He appears well-developed and well-nourished. No distress.  Overweight  Cardiovascular: Normal rate, regular rhythm and normal heart sounds.   No murmur heard. Pulmonary/Chest: Effort normal and breath sounds normal. No respiratory distress.  Vitals reviewed.   BP 122/72 mmHg  Pulse 82  Temp(Src) 97.7 F (36.5 C) (Oral)  Ht  (1.727 m)  Wt 177 lb (80.287 kg)  BMI 26.92 kg/m2  SpO2 95% Wt Readings from Last 3 Encounters:  03/23/15 177 lb (80.287 kg)  10/02/14 185 lb 4 oz (84.029 kg)  09/28/14 185 lb 2 oz (83.972 kg)     Lab Results  Component Value Date   WBC 7.5 09/26/2013   HGB 12.9* 09/26/2013   HCT 38.2* 09/26/2013   PLT 209.0 09/26/2013   GLUCOSE 195* 09/28/2014   CHOL 146 09/28/2014   TRIG 158.0* 09/28/2014   HDL 50.00 09/28/2014   LDLDIRECT 72.8 04/18/2011   LDLCALC 64 09/28/2014   ALT 21 09/26/2013   AST 19 09/26/2013   NA 140 09/28/2014   K 4.7 09/28/2014   CL 107 09/28/2014   CREATININE 1.2 09/28/2014   BUN 30* 09/28/2014   CO2 25 09/28/2014   TSH 1.07 04/18/2011   PSA 0.6 09/22/2010   INR 0.97 06/13/2013   HGBA1C 6.7* 09/28/2014   MICROALBUR 0.6 09/28/2014    Dg Chest 2 View  06/13/2013   *RADIOLOGY REPORT*  Clinical Data: Preoperative evaluation for lumbar surgery  CHEST - 2 VIEW  Comparison: 04/09/2009  Findings: Postsurgical changes are again noted.  The lungs are clear bilaterally.  No acute bony abnormality is noted.  IMPRESSION: No acute abnormalities seen.   Original Report Authenticated By: Alcide Clever, M.D.       Assessment & Plan:   Problem List Items Addressed This Visit    Degeneration of lumbar or lumbosacral intervertebral disc    Interval history reviewed Continue ongoing orthopedic care and pain management as reviewed      Hyperlipidemia    On statin - tolerating well - cards changed crestor to atorva 08/2012 for $ Also on niacin goal LDL<70 with CAD hx - check semiannually, last lipids reviewed The current medical regimen is effective;  continue present plan and medications.  Relevant Medications   carvedilol (COREG) 6.25 MG tablet   HYPERTENSION, BENIGN    BP Readings from Last 3 Encounters:  03/23/15 122/72  10/02/14 110/60  09/28/14 112/66  ACEI stopped summer 2013 due to side effects The current medical regimen is effective;  continue present plan and medications.      Relevant Medications   carvedilol (COREG) 6.25 MG tablet   Type 2 diabetes mellitus with neurological manifestations, controlled - Primary    Reports good control - on metformin (dose increased 03/2012) + statin and ASA  Suspect improved a1c with intentional weight loss efforts over early 2014 Off ACEI since summer 2013 -  Check a1c now, adjust as needed Lab Results  Component Value Date    HGBA1C 6.7* 09/28/2014        Relevant Orders   Hemoglobin A1c   Lipid panel   Vitamin D deficiency    Hx low levels recheck level now Ongoing OTC replacement, increase dose as needed      Relevant Orders   Vit D  25 hydroxy (rtn osteoporosis monitoring)       Rene PaciValerie Zeric Baranowski, MD

## 2015-03-23 NOTE — Patient Instructions (Signed)
It was good to see you today.  We have reviewed your prior records including labs and tests today  Test(s) ordered today. Your results will be released to MyChart (or called to you) after review, usually within 72hours after test completion. If any changes need to be made, you will be notified at that same time.  Medications reviewed and updated, no changes recommended at this time. Refill on medication(s) as discussed today.  Please schedule followup in 6 months for annual review, call sooner if problems.

## 2015-04-05 ENCOUNTER — Encounter: Payer: Self-pay | Admitting: Cardiovascular Disease

## 2015-04-05 ENCOUNTER — Ambulatory Visit (INDEPENDENT_AMBULATORY_CARE_PROVIDER_SITE_OTHER): Payer: Medicare Other | Admitting: Cardiovascular Disease

## 2015-04-05 VITALS — BP 100/66 | HR 71 | Ht 68.0 in | Wt 177.0 lb

## 2015-04-05 DIAGNOSIS — E1149 Type 2 diabetes mellitus with other diabetic neurological complication: Secondary | ICD-10-CM

## 2015-04-05 DIAGNOSIS — I10 Essential (primary) hypertension: Secondary | ICD-10-CM | POA: Diagnosis not present

## 2015-04-05 DIAGNOSIS — E785 Hyperlipidemia, unspecified: Secondary | ICD-10-CM

## 2015-04-05 DIAGNOSIS — E1140 Type 2 diabetes mellitus with diabetic neuropathy, unspecified: Secondary | ICD-10-CM | POA: Diagnosis not present

## 2015-04-05 DIAGNOSIS — I25709 Atherosclerosis of coronary artery bypass graft(s), unspecified, with unspecified angina pectoris: Secondary | ICD-10-CM | POA: Diagnosis not present

## 2015-04-05 NOTE — Assessment & Plan Note (Signed)
Blood pressure is well controlled on today's visit. No changes made to the medications. 

## 2015-04-05 NOTE — Assessment & Plan Note (Signed)
We have encouraged continued exercise, careful diet management in an effort to lose weight. 

## 2015-04-05 NOTE — Patient Instructions (Signed)
You are doing well. No medication changes were made.  If leg cramping gets worse, Try CoEnz Q10 If cramping continues, call the office  Try some stretching  Please call us if you have new issues that need to be addressed before your next appt.  Your physician wants you to follow-up in: 6 months.  You will receive a reminder letter in the mail two months in advance. If you don't receive a letter, please call our office to schedule the follow-up appointment.

## 2015-04-05 NOTE — Assessment & Plan Note (Signed)
Stressed the importance of low carbohydrate diet. Recommended he restart his daily walks with his new dog

## 2015-04-05 NOTE — Progress Notes (Signed)
[name], male    DOB: [birth year], 81 y.o.   MRN: [id number]  HPI Comments:  Andrew [name] is a very pleasant 81 year old gentleman with history of coronary artery disease, bypass in May 2010, hyperlipidemia, diabetes, hypertension, who presents for follow-up of his coronary artery disease  In follow-up today, he is very sad, recently lost his Rottweiler after 13 years. Recently adopted a rescue dog [name]  He does not do a regular exercise program  EKG on today's visit shows normal sinus rhythm with rate 71 bpm, right bundle branch block  Other past medical history  History of a pinched nerve, back surgery 06/19/2013. He continues to have back pain, status post radiofrequency treatments x3 through [facility].  Biggest complaint is arthritis in his hips, back, knees. He reports that he has tried Cialis and Viagra in the past for erectile dysfunction. This did not work for him   total cholesterol 141, LDL 67   Allergies  Allergen Reactions  . Penicillins Other (See Comments)    Taste buds    Current Outpatient Prescriptions on File Prior to Visit  Medication Sig Dispense Refill  . aspirin EC 81 MG tablet Take 81 mg by mouth daily.    Marland Kitchen atorvastatin (LIPITOR) 20 MG tablet Take 1 tablet (20 mg total) by mouth daily at 6 PM. 90 tablet 3  . carvedilol (COREG) 6.25 MG tablet Take 0.5 tablets (3.125 mg total) by mouth 2 (two) times daily with a meal. 90 tablet 3  . celecoxib (CELEBREX) 200 MG capsule Take 200 mg by mouth 2 (two) times daily.    . cholecalciferol (VITAMIN D) 1000 UNITS tablet Take 1,000 Units by mouth daily.    . clopidogrel (PLAVIX) 75 MG tablet Take 1 tablet (75 mg total) by mouth daily. 90 tablet 3  . HYDROcodone-acetaminophen (NORCO/VICODIN) 5-325 MG per tablet Take 1 tablet by mouth every 6 (six) hours as needed.     . metFORMIN (GLUCOPHAGE-XR) 500 MG 24 hr tablet Take 1 tablet (500 mg  total) by mouth 2 (two) times daily. 180 tablet 3  . Multiple Vitamin (MULTIVITAMIN WITH MINERALS) TABS tablet Take 1 tablet by mouth daily.    . niacin (NIASPAN) 1000 MG CR tablet Take 1 tablet (1,000 mg total) by mouth at bedtime. 90 tablet 3   No current facility-administered medications on file prior to visit.    Past Medical History  Diagnosis Date  . Coronary artery disease 02/2009    severe left main and three-vessel, CABG  . Colonic polyp 2011 last colo    colo q 106yr - [facility]   . Diverticulosis of colon   . DEGENERATIVE JOINT DISEASE   . VITAMIN D DEFICIENCY   . DIVERTICULOSIS, COLON   . HYPERLIPIDEMIA-MIXED   . HYPERTENSION, BENIGN   . Type II or unspecified type diabetes mellitus without mention of complication, not stated as uncontrolled   . LBP (low back pain)     s/p decompression 8/14; ESI - [place]; RFA x 3 early 2015    Past Surgical History  Procedure Laterality Date  . Knee arthroscopy  2007    right  . Lumbar epidural injection  2013    Has as needed.  . Coronary artery bypass graft  03/17/09    x 4  . Inguinal hernia repair  1980  . Carpal tunnel release Right   . Trigger finger release Right   . Lumbar laminectomy/decompression  microdiscectomy N/A 06/19/2013    Procedure: LUMBAR LAMINECTOMY/DECOMPRESSION MICRODISCECTOMY;  Surgeon: Emilee Hero, MD;  Location: [facility] OR;  Service: Orthopedics;  Laterality: N/A;  Lumbar 4-5 decompression  . Cataract extraction, bilateral      R 07/07/13, L 07/21/13    Social History  reports that he has never smoked. He has never used smokeless tobacco. He reports that he drinks alcohol. He reports that he does not use illicit drugs.  Family History family history includes Diabetes in his other; Heart failure in his father and mother. There is no history of Heart disease.   Review of Systems  Constitutional: Negative.   HENT: Negative.   Respiratory: Negative.   Cardiovascular: Negative.   Gastrointestinal:  Negative.   Musculoskeletal: Positive for back pain.  Skin: Negative.   Neurological: Negative.   Hematological: Negative.   Psychiatric/Behavioral: Negative.   All other systems reviewed and are negative.   BP 100/66 mmHg  Pulse 71  Ht 5\' 8"  (1.727 m)  Wt 177 lb (80.287 kg)  BMI 26.92 kg/m2  Physical Exam  Constitutional: He is oriented to person, place, and time. He appears well-developed and well-nourished.  HENT:  Head: Normocephalic.  Nose: Nose normal.  Mouth/Throat: Oropharynx is clear and moist.  Eyes: Conjunctivae are normal. Pupils are equal, round, and reactive to light.  Neck: Normal range of motion. Neck supple. No JVD present.  Cardiovascular: Normal rate, regular rhythm, S1 normal, S2 normal, normal heart sounds and intact distal pulses.  Exam reveals no gallop and no friction rub.   No murmur heard. Pulmonary/Chest: Effort normal and breath sounds normal. No respiratory distress. He has no wheezes. He has no rales. He exhibits no tenderness.  Abdominal: Soft. Bowel sounds are normal. He exhibits no distension. There is no tenderness.  Musculoskeletal: Normal range of motion. He exhibits no edema or tenderness.  Lymphadenopathy:    He has no cervical adenopathy.  Neurological: He is alert and oriented to person, place, and time. Coordination normal.  Skin: Skin is warm and dry. No rash noted. No erythema.  Psychiatric: He has a normal mood and affect. His behavior is normal. Judgment and thought content normal.      Assessment and Plan   Nursing note and vitals reviewed.

## 2015-04-05 NOTE — Assessment & Plan Note (Signed)
Currently with no symptoms of angina. No further workup at this time. Continue current medication regimen. 

## 2015-06-10 ENCOUNTER — Telehealth: Payer: Self-pay | Admitting: Cardiovascular Disease

## 2015-06-10 NOTE — Telephone Encounter (Signed)
Spoke with pt and he states that his back injection has not been scheduled as of yet because they are waiting on clearance from Dr. Mariah Milling. Pt states that they need to know how long pt can hold Plavix prior to the injection. Pt is hoping to have injection done prior to Labor Day weekend because he is planning to go on a fishing trip. Pt states that they only do the injections on Wed, Thurs and Fri. Will forward to Dr. Mariah Milling for review and advisement on clearance and how long to hold Plavix.

## 2015-06-10 NOTE — Telephone Encounter (Signed)
Patient is having back injection and wants to know when to hold and how long to hold Plavix 75 mg po daily .  Please fax recommendation/clearance to Dr. Romero Belling at [facility] in [place] at [phone]  .  Please call patient when clearance has been faxed so he can call and schedule injection asap.

## 2015-06-10 NOTE — Telephone Encounter (Signed)
Ok to proceed with injection Would hold plavix as long as neuro doctor needs. 7 to 14 days ok Typically would stay on aspirin if ok with neuro team

## 2015-06-10 NOTE — Telephone Encounter (Signed)
Spoke with pt and provided him with information given by Dr. Mariah Milling. Faxed information over to Dr. Marcy Siren office.

## 2015-09-09 LAB — HM DIABETES EYE EXAM

## 2015-09-22 ENCOUNTER — Other Ambulatory Visit (INDEPENDENT_AMBULATORY_CARE_PROVIDER_SITE_OTHER): Payer: Medicare Other

## 2015-09-22 ENCOUNTER — Encounter: Payer: Self-pay | Admitting: Internal Medicine

## 2015-09-22 ENCOUNTER — Ambulatory Visit (INDEPENDENT_AMBULATORY_CARE_PROVIDER_SITE_OTHER): Payer: Medicare Other | Admitting: Internal Medicine

## 2015-09-22 VITALS — BP 124/70 | HR 73 | Temp 97.6°F | Ht 68.0 in | Wt 181.5 lb

## 2015-09-22 DIAGNOSIS — Z23 Encounter for immunization: Secondary | ICD-10-CM

## 2015-09-22 DIAGNOSIS — E785 Hyperlipidemia, unspecified: Secondary | ICD-10-CM

## 2015-09-22 DIAGNOSIS — Z0000 Encounter for general adult medical examination without abnormal findings: Secondary | ICD-10-CM

## 2015-09-22 DIAGNOSIS — E1149 Type 2 diabetes mellitus with other diabetic neurological complication: Secondary | ICD-10-CM

## 2015-09-22 DIAGNOSIS — I25709 Atherosclerosis of coronary artery bypass graft(s), unspecified, with unspecified angina pectoris: Secondary | ICD-10-CM

## 2015-09-22 LAB — BASIC METABOLIC PANEL
BUN: 28 mg/dL — ABNORMAL HIGH (ref 6–23)
CO2: 29 meq/L (ref 19–32)
Calcium: 9.9 mg/dL (ref 8.4–10.5)
Chloride: 105 mEq/L (ref 96–112)
Creatinine, Ser: 1.13 mg/dL (ref 0.40–1.50)
GFR: 66.49 mL/min (ref >60.00)
GLUCOSE: 116 mg/dL — AB (ref 70–99)
Potassium: 4.9 mEq/L (ref 3.5–5.1)
Sodium: 141 mEq/L (ref 135–145)

## 2015-09-22 LAB — CBC WITH DIFFERENTIAL/PLATELET
Basophils Absolute: 0 10*3/uL (ref 0.0–0.1)
Basophils Relative: 0.4 % (ref 0.0–3.0)
EOS PCT: 5.3 % — AB (ref 0.0–5.0)
Eosinophils Absolute: 0.4 10*3/uL (ref 0.0–0.7)
HCT: 40.9 % (ref 39.0–52.0)
HEMOGLOBIN: 13.7 g/dL (ref 13.0–17.0)
Lymphocytes Relative: 29.4 % (ref 12.0–46.0)
Lymphs Abs: 2.3 10*3/uL (ref 0.7–4.0)
MCHC: 33.5 g/dL (ref 30.0–36.0)
MCV: 97.8 fl (ref 78.0–100.0)
MONOS PCT: 7.8 % (ref 3.0–12.0)
Monocytes Absolute: 0.6 10*3/uL (ref 0.1–1.0)
Neutro Abs: 4.4 10*3/uL (ref 1.4–7.7)
Neutrophils Relative %: 57.1 % (ref 43.0–77.0)
Platelets: 219 10*3/uL (ref 150.0–400.0)
RBC: 4.18 Mil/uL — AB (ref 4.22–5.81)
RDW: 14.2 % (ref 11.5–15.5)
WBC: 7.8 10*3/uL (ref 4.0–10.5)

## 2015-09-22 LAB — HEPATIC FUNCTION PANEL
ALBUMIN: 4.2 g/dL (ref 3.5–5.2)
ALK PHOS: 57 U/L (ref 39–117)
ALT: 16 U/L (ref 0–53)
AST: 19 U/L (ref 0–37)
BILIRUBIN TOTAL: 0.6 mg/dL (ref 0.2–1.2)
Bilirubin, Direct: 0.1 mg/dL (ref 0.0–0.3)
Total Protein: 7.1 g/dL (ref 6.0–8.3)

## 2015-09-22 LAB — HEMOGLOBIN A1C: HEMOGLOBIN A1C: 6.5 % (ref 4.6–6.5)

## 2015-09-22 LAB — LIPID PANEL
CHOLESTEROL: 160 mg/dL (ref 0–200)
HDL: 49.7 mg/dL (ref >39.00)
LDL CALC: 83 mg/dL (ref 0–99)
NONHDL: 109.92
Total CHOL/HDL Ratio: 3
Triglycerides: 135 mg/dL (ref 0.0–149.0)
VLDL: 27 mg/dL (ref 0.0–40.0)

## 2015-09-22 LAB — TSH: TSH: 1 u[IU]/mL (ref 0.35–4.50)

## 2015-09-22 LAB — MICROALBUMIN / CREATININE URINE RATIO
Creatinine,U: 125.3 mg/dL
MICROALB/CREAT RATIO: 0.6 mg/g (ref 0.0–30.0)
Microalb, Ur: <0.7 mg/dL (ref 0.0–1.9)

## 2015-09-22 NOTE — Assessment & Plan Note (Signed)
S/p CABG x 4 03/17/2009 - no anginal symptoms - medical mgmt ongoing with Plavix, ASA, statin, ACEI, carvedilol  Follows with cards annually and prn - [place] The current medical regimen is effective;  continue present plan and medications.

## 2015-09-22 NOTE — Assessment & Plan Note (Signed)
Reports good control - on metformin (dose increased 03/2012) + statin and ASA  Suspect improved a1c with intentional weight loss efforts over early 2014 Off ACEI since summer 2013 -  Check a1c now, adjust as needed Lab Results  Component Value Date   HGBA1C 6.3 03/23/2015

## 2015-09-22 NOTE — Patient Instructions (Signed)
It was good to see you today.  We have reviewed your prior records including labs and tests today  Prevnar 13 (2nd pneumonia vaccine) updated today - other Health Maintenance reviewed - all recommended immunizations and age-appropriate screenings are up-to-date.  Test(s) ordered today. Your results will be released to [place] (or called to you) after review, usually within 72hours after test completion. If any changes need to be made, you will be notified at that same time.  Medications reviewed and updated, no changes recommended at this time.  Please keep scheduled followup in 6 months with Dr. Quay Burow for semannual exam and labs, call sooner if problems.   Health Maintenance A healthy lifestyle and preventative care can promote health and wellness.  Maintain regular health, dental, and eye exams.  Eat a healthy diet. Foods like vegetables, fruits, whole grains, low-fat dairy products, and lean protein foods contain the nutrients you need and are low in calories. Decrease your intake of foods high in solid fats, added sugars, and salt. Get information about a proper diet from your health care provider, if necessary.  Regular physical exercise is one of the most important things you can do for your health. Most adults should get at least 150 minutes of moderate-intensity exercise (any activity that increases your heart rate and causes you to sweat) each week. In addition, most adults need muscle-strengthening exercises on 2 or more days a week.   Maintain a healthy weight. The body mass index (BMI) is a screening tool to identify possible weight problems. It provides an estimate of body fat based on height and weight. Your health care provider can find your BMI and can help you achieve or maintain a healthy weight. For males 20 years and older:  A BMI below 18.5 is considered underweight.  A BMI of 18.5 to 24.9 is normal.  A BMI of 25 to 29.9 is considered overweight.  A BMI of 30 and  above is considered obese.  Maintain normal blood lipids and cholesterol by exercising and minimizing your intake of saturated fat. Eat a balanced diet with plenty of fruits and vegetables. Blood tests for lipids and cholesterol should begin at age 2 and be repeated every 5 years. If your lipid or cholesterol levels are high, you are over age 23, or you are at high risk for heart disease, you may need your cholesterol levels checked more frequently.Ongoing high lipid and cholesterol levels should be treated with medicines if diet and exercise are not working.  If you smoke, find out from your health care provider how to quit. If you do not use tobacco, do not start.  Lung cancer screening is recommended for adults aged 40-80 years who are at high risk for developing lung cancer because of a history of smoking. A yearly low-dose CT scan of the lungs is recommended for people who have at least a 30-pack-year history of smoking and are current smokers or have quit within the past 15 years. A pack year of smoking is smoking an average of 1 pack of cigarettes a day for 1 year (for example, a 30-pack-year history of smoking could mean smoking 1 pack a day for 30 years or 2 packs a day for 15 years). Yearly screening should continue until the smoker has stopped smoking for at least 15 years. Yearly screening should be stopped for people who develop a health problem that would prevent them from having lung cancer treatment.  If you choose to drink alcohol, do not have  more than 2 drinks per day. One drink is considered to be 12 oz (360 mL) of beer, 5 oz (150 mL) of wine, or 1.5 oz (45 mL) of liquor.  Avoid the use of street drugs. Do not share needles with anyone. Ask for help if you need support or instructions about stopping the use of drugs.  High blood pressure causes heart disease and increases the risk of stroke. Blood pressure should be checked at least every 1-2 years. Ongoing high blood pressure  should be treated with medicines if weight loss and exercise are not effective.  If you are 78-51 years old, ask your health care provider if you should take aspirin to prevent heart disease.  Diabetes screening involves taking a blood sample to check your fasting blood sugar level. This should be done once every 3 years after age 39 if you are at a normal weight and without risk factors for diabetes. Testing should be considered at a younger age or be carried out more frequently if you are overweight and have at least 1 risk factor for diabetes.  Colorectal cancer can be detected and often prevented. Most routine colorectal cancer screening begins at the age of 44 and continues through age 41. However, your health care provider may recommend screening at an earlier age if you have risk factors for colon cancer. On a yearly basis, your health care provider may provide home test kits to check for hidden blood in the stool. A small camera at the end of a tube may be used to directly examine the colon (sigmoidoscopy or colonoscopy) to detect the earliest forms of colorectal cancer. Talk to your health care provider about this at age 68 when routine screening begins. A direct exam of the colon should be repeated every 5-10 years through age 27, unless early forms of precancerous polyps or small growths are found.  People who are at an increased risk for hepatitis B should be screened for this virus. You are considered at high risk for hepatitis B if:  You were born in a country where hepatitis B occurs often. Talk with your health care provider about which countries are considered high risk.  Your parents were born in a high-risk country and you have not received a shot to protect against hepatitis B (hepatitis B vaccine).  You have HIV or AIDS.  You use needles to inject street drugs.  You live with, or have sex with, someone who has hepatitis B.  You are a man who has sex with other men  (MSM).  You get hemodialysis treatment.  You take certain medicines for conditions like cancer, organ transplantation, and autoimmune conditions.  Hepatitis C blood testing is recommended for all people born from [birth year] through 1963 and any individual with known risk factors for hepatitis C.  Healthy men should no longer receive prostate-specific antigen (PSA) blood tests as part of routine cancer screening. Talk to your health care provider about prostate cancer screening.  Testicular cancer screening is not recommended for adolescents or adult males who have no symptoms. Screening includes self-exam, a health care provider exam, and other screening tests. Consult with your health care provider about any symptoms you have or any concerns you have about testicular cancer.  Practice safe sex. Use condoms and avoid high-risk sexual practices to reduce the spread of sexually transmitted infections (STIs).  You should be screened for STIs, including gonorrhea and chlamydia if:  You are sexually active and are younger than 24 years.  You are older than 24 years, and your health care provider tells you that you are at risk for this type of infection.  Your sexual activity has changed since you were last screened, and you are at an increased risk for chlamydia or gonorrhea. Ask your health care provider if you are at risk.  If you are at risk of being infected with HIV, it is recommended that you take a prescription medicine daily to prevent HIV infection. This is called pre-exposure prophylaxis (PrEP). You are considered at risk if:  You are a man who has sex with other men (MSM).  You are a heterosexual man who is sexually active with multiple partners.  You take drugs by injection.  You are sexually active with a partner who has HIV.  Talk with your health care provider about whether you are at high risk of being infected with HIV. If you choose to begin PrEP, you should first be tested for  HIV. You should then be tested every 3 months for as long as you are taking PrEP.  Use sunscreen. Apply sunscreen liberally and repeatedly throughout the day. You should seek shade when your shadow is shorter than you. Protect yourself by wearing long sleeves, pants, a wide-brimmed hat, and sunglasses year round whenever you are outdoors.  Tell your health care provider of new moles or changes in moles, especially if there is a change in shape or color. Also, tell your health care provider if a mole is larger than the size of a pencil eraser.  A one-time screening for abdominal aortic aneurysm (AAA) and surgical repair of large AAAs by ultrasound is recommended for men aged 73-75 years who are current or former smokers.  Stay current with your vaccines (immunizations). Document Released: 04/06/2008 Document Revised: 10/14/2013 Document Reviewed: 03/06/2011 [facility] Patient Information 2015 [place], [place]. This information is not intended to replace advice given to you by your health care provider. Make sure you discuss any questions you have with your health care provider. Diabetes and Standards of Medical Care Diabetes is complicated. You may find that your diabetes team includes a dietitian, nurse, diabetes educator, eye doctor, and more. To help everyone know what is going on and to help you get the care you deserve, the following schedule of care was developed to help keep you on track. Below are the tests, exams, vaccines, medicines, education, and plans you will need. HbA1c test This test shows how well you have controlled your glucose over the past 2-3 months. It is used to see if your diabetes management plan needs to be adjusted.   It is performed at least 2 times a year if you are meeting treatment goals.  It is performed 4 times a year if therapy has changed or if you are not meeting treatment goals. Blood pressure test  This test is performed at every routine medical visit. The goal  is less than 140/90 mm Hg for most people, but 130/80 mm Hg in some cases. Ask your health care provider about your goal. Dental exam  Follow up with the dentist regularly. Eye exam  If you are diagnosed with type 1 diabetes as a child, get an exam upon reaching the age of 50 years or older and have had diabetes for 3-5 years. Yearly eye exams are recommended after that initial eye exam.  If you are diagnosed with type 1 diabetes as an adult, get an exam within 5 years of diagnosis and then yearly.  If you  are diagnosed with type 2 diabetes, get an exam as soon as possible after the diagnosis and then yearly. Foot care exam  Visual foot exams are performed at every routine medical visit. The exams check for cuts, injuries, or other problems with the feet.  A comprehensive foot exam should be done yearly. This includes visual inspection as well as assessing foot pulses and testing for loss of sensation.  Check your feet nightly for cuts, injuries, or other problems with your feet. Tell your health care provider if anything is not healing. Kidney function test (urine microalbumin)  This test is performed once a year.  Type 1 diabetes: The first test is performed 5 years after diagnosis.  Type 2 diabetes: The first test is performed at the time of diagnosis.  A serum creatinine and estimated glomerular filtration rate (eGFR) test is done once a year to assess the level of chronic kidney disease (CKD), if present. Lipid profile (cholesterol, HDL, LDL, triglycerides)  Performed every 5 years for most people.  The goal for LDL is less than 100 mg/dL. If you are at high risk, the goal is less than 70 mg/dL.  The goal for HDL is 40 mg/dL-50 mg/dL for men and 50 mg/dL-60 mg/dL for women. An HDL cholesterol of 60 mg/dL or higher gives some protection against heart disease.  The goal for triglycerides is less than 150 mg/dL. Influenza vaccine, pneumococcal vaccine, and hepatitis B  vaccine  The influenza vaccine is recommended yearly.  It is recommended that people with diabetes who are over 34 years old get the pneumonia vaccine. In some cases, two separate shots may be given. Ask your health care provider if your pneumonia vaccination is up to date.  The hepatitis B vaccine is also recommended for adults with diabetes. Diabetes self-management education  Education is recommended at diagnosis and ongoing as needed. Treatment plan  Your treatment plan is reviewed at every medical visit. Document Released: 08/06/2009 Document Revised: 02/23/2014 Document Reviewed: 03/11/2013 [facility] Patient Information 2015 [place], [place]. This information is not intended to replace advice given to you by your health care provider. Make sure you discuss any questions you have with your health care provider.

## 2015-09-22 NOTE — Assessment & Plan Note (Signed)
On statin - tolerating well - cards changed crestor to atorva 08/2012 for $ Also on niacin goal LDL<70 with CAD hx - check semiannually, last lipids reviewed The current medical regimen is effective;  continue present plan and medications.

## 2015-09-22 NOTE — Progress Notes (Signed)
Subjective:    [name], male    DOB: [birth year], 81 y.o.   MRN: [id number]  HPI   Here for medicare wellness  Diet: heart healthy, carb mod/diabetic Physical activity: sedentary Depression/mood screen: negative Hearing: intact to whispered voice Visual acuity: grossly normal, performs annual eye exam  ADLs: capable Fall risk: none Home safety: good Cognitive evaluation: intact to orientation, naming, recall and repetition EOL planning: adv directives, full code/ I agree  I have personally reviewed and have noted 1. The patient's medical and social history 2. Their use of alcohol, tobacco or illicit drugs 3. Their current medications and supplements 4. The patient's functional ability including ADL's, fall risks, home safety risks and hearing or visual impairment. 5. Diet and physical activities 6. Evidence for depression or mood disorders  Also reviewed chronic medical conditions, interval events and current concerns   Past Medical History  Diagnosis Date  . Coronary artery disease 02/2009    severe left main and three-vessel, CABG  . Colonic polyp 2011 last colo    colo q 63yr - [facility]   . Diverticulosis of colon   . DEGENERATIVE JOINT DISEASE   . VITAMIN D DEFICIENCY   . DIVERTICULOSIS, COLON   . HYPERLIPIDEMIA-MIXED   . HYPERTENSION, BENIGN   . Type II or unspecified type diabetes mellitus without mention of complication, not stated as uncontrolled   . LBP (low back pain)     s/p decompression 8/14; ESI - [place]; RFA x 3 early 2015   Family History  Problem Relation Age of Onset  . Heart failure Mother     died age 52  . Heart failure Father     died age 24  . Heart disease Neg Hx   . Diabetes Other    Social History  Substance Use Topics  . Smoking status: Never Smoker   . Smokeless tobacco: Never Used     Comment: Married, 3 grown children. Retired 04/2002 from [employer] work  . Alcohol Use: Yes    Review of Systems    Constitutional: Negative for fever, activity change, appetite change, fatigue and unexpected weight change.  Respiratory: Negative for cough, chest tightness, shortness of breath and wheezing.   Cardiovascular: Negative for chest pain, palpitations and leg swelling.  Neurological: Negative for dizziness, weakness and headaches.  Psychiatric/Behavioral: Negative for dysphoric mood. The patient is not nervous/anxious.   All other systems reviewed and are negative.   Patient Care Team: Newt Lukes, MD as PCP - General (Internal Medicine) Lunette Stands, MD as Consulting Physician (Orthopedic Surgery) Loreta Ave, MD as Consulting Physician (Orthopedic Surgery) Antonieta Iba, MD as Consulting Physician (Cardiology) Estill Bamberg, MD (Orthopedic Surgery) Francene Boyers, MD (Optometry) Carman Ching, MD (Gastroenterology)     Objective:    Physical Exam  Constitutional: He appears well-developed and well-nourished. No distress.  Overweight  Cardiovascular: Normal rate, regular rhythm and normal heart sounds.   No murmur heard. Pulmonary/Chest: Effort normal and breath sounds normal. No respiratory distress.  Vitals reviewed.   BP 124/70 mmHg  Pulse 73  Temp(Src) 97.6 F (36.4 C) (Oral)  Ht  (1.727 m)  Wt 181 lb 8 oz (82.328 kg)  BMI 27.60 kg/m2  SpO2 97% Wt Readings from Last 3 Encounters:  09/22/15 181 lb 8 oz (82.328 kg)  04/05/15 177 lb (80.287 kg)  03/23/15 177 lb (80.287 kg)    Lab Results  Component Value Date   WBC 7.5 09/26/2013  HGB 12.9* 09/26/2013   HCT 38.2* 09/26/2013   PLT 209.0 09/26/2013   GLUCOSE 195* 09/28/2014   CHOL 151 03/23/2015   TRIG 123.0 03/23/2015   HDL 51.70 03/23/2015   LDLDIRECT 72.8 04/18/2011   LDLCALC 75 03/23/2015   ALT 21 09/26/2013   AST 19 09/26/2013   NA 140 09/28/2014   K 4.7 09/28/2014   CL 107 09/28/2014   CREATININE 1.2 09/28/2014   BUN 30* 09/28/2014   CO2 25 09/28/2014   TSH 1.07 04/18/2011    PSA 0.6 09/22/2010   INR 0.97 06/13/2013   HGBA1C 6.3 03/23/2015   MICROALBUR 0.6 09/28/2014    Dg Chest 2 View  06/13/2013  *RADIOLOGY REPORT* Clinical Data: Preoperative evaluation for lumbar surgery CHEST - 2 VIEW Comparison: 04/09/2009 Findings: Postsurgical changes are again noted.  The lungs are clear bilaterally.  No acute bony abnormality is noted. IMPRESSION: No acute abnormalities seen. Original Report Authenticated By: Alcide CleverMark Lukens, M.D.       Assessment & Plan:   [id number] - Today patient counseled on age appropriate routine health concerns for screening and prevention, each reviewed and up to date or declined. Immunizations reviewed and up to date or declined. Labs ordered and reviewed. Risk factors for depression reviewed and negative. Hearing function and visual acuity are intact. ADLs screened and addressed as needed. Functional ability and level of safety reviewed and appropriate. Education, counseling and referrals performed based on assessed risks today. Patient provided with a copy of personalized plan for preventive services.  Problem List Items Addressed This Visit    CAD, ARTERY BYPASS GRAFT    S/p CABG x 4 03/17/2009 - no anginal symptoms - medical mgmt ongoing with Plavix, ASA, statin, ACEI, carvedilol  Follows with cards annually and prn - [place] The current medical regimen is effective;  continue present plan and medications.       Relevant Orders   CBC with Differential/Platelet   Hepatic function panel   Lipid panel   TSH   Hyperlipidemia    On statin - tolerating well - cards changed crestor to atorva 08/2012 for $ Also on niacin goal LDL<70 with CAD hx - check semiannually, last lipids reviewed The current medical regimen is effective;  continue present plan and medications.       Relevant Orders   Lipid panel   Type 2 diabetes mellitus with neurological manifestations, controlled (HCC)    Reports good control - on metformin (dose increased 03/2012)  + statin and ASA  Suspect improved a1c with intentional weight loss efforts over early 2014 Off ACEI since summer 2013 -  Check a1c now, adjust as needed Lab Results  Component Value Date   HGBA1C 6.3 03/23/2015        Relevant Orders   Basic metabolic panel   Lipid panel   Hemoglobin A1c   Microalbumin / creatinine urine ratio    Other Visit Diagnoses    Routine general medical examination at a health care facility    -  Primary        Rene PaciValerie Lennette Fader, MD

## 2015-09-28 ENCOUNTER — Encounter: Payer: Self-pay | Admitting: Internal Medicine

## 2015-10-06 ENCOUNTER — Ambulatory Visit: Payer: Medicare Other | Admitting: Cardiovascular Disease

## 2015-10-07 ENCOUNTER — Ambulatory Visit (INDEPENDENT_AMBULATORY_CARE_PROVIDER_SITE_OTHER): Payer: Medicare Other | Admitting: Cardiovascular Disease

## 2015-10-07 ENCOUNTER — Encounter: Payer: Self-pay | Admitting: Cardiovascular Disease

## 2015-10-07 VITALS — BP 120/60 | HR 68 | Ht 68.0 in | Wt 184.2 lb

## 2015-10-07 DIAGNOSIS — E1149 Type 2 diabetes mellitus with other diabetic neurological complication: Secondary | ICD-10-CM | POA: Diagnosis not present

## 2015-10-07 DIAGNOSIS — E785 Hyperlipidemia, unspecified: Secondary | ICD-10-CM | POA: Diagnosis not present

## 2015-10-07 DIAGNOSIS — I25709 Atherosclerosis of coronary artery bypass graft(s), unspecified, with unspecified angina pectoris: Secondary | ICD-10-CM

## 2015-10-07 DIAGNOSIS — I10 Essential (primary) hypertension: Secondary | ICD-10-CM

## 2015-10-07 NOTE — Assessment & Plan Note (Signed)
We have encouraged continued exercise, careful diet management in an effort to lose weight. 

## 2015-10-07 NOTE — Progress Notes (Signed)
[name], male    DOB: [birth year], 81 y.o.   MRN: [id number]  HPI Comments:  Andrew [name] is a very pleasant 81 year old gentleman with history of coronary artery disease, bypass in May 2010, hyperlipidemia, diabetes, hypertension, who presents for follow-up of his coronary artery disease  In follow-up today, he reports that he is doing well No regular exercise program He adopted a new dog but does not walk very far as the animal is very small, runs on its own Denies any significant chest pain concerning for angina, no shortness of breath symptoms Lab work reviewed with him showing hemoglobin A1c 6.5, total cholesterol has increased up to 160, LDL 80 He attributes to the [place] cholesterol 2 small weight gain  EKG on today's visit shows normal sinus rhythm with rate 68 bpm, right bundle branch block  Other past medical history  History of a pinched nerve, back surgery 06/19/2013. He continues to have back pain, status post radiofrequency treatments x3 through [facility].  Biggest complaint is arthritis in his hips, back, knees. He reports that he has tried Cialis and Viagra in the past for erectile dysfunction. This did not work for him  Previous total cholesterol 141, LDL 67   Allergies  Allergen Reactions  . Penicillins Other (See Comments)    Taste buds    Current Outpatient Prescriptions on File Prior to Visit  Medication Sig Dispense Refill  . aspirin EC 81 MG tablet Take 81 mg by mouth daily.    Marland Kitchen atorvastatin (LIPITOR) 20 MG tablet Take 1 tablet (20 mg total) by mouth daily at 6 PM. 90 tablet 3  . carvedilol (COREG) 6.25 MG tablet Take 0.5 tablets (3.125 mg total) by mouth 2 (two) times daily with a meal. 90 tablet 3  . celecoxib (CELEBREX) 200 MG capsule Take 200 mg by mouth 2 (two) times daily.    . cholecalciferol (VITAMIN D) 1000 UNITS tablet Take 1,000 Units by mouth daily.    . clopidogrel (PLAVIX) 75 MG tablet Take 1 tablet (75 mg total)  by mouth daily. 90 tablet 3  . HYDROcodone-acetaminophen (NORCO/VICODIN) 5-325 MG per tablet Take 1 tablet by mouth every 6 (six) hours as needed.     . metFORMIN (GLUCOPHAGE-XR) 500 MG 24 hr tablet Take 1 tablet (500 mg total) by mouth 2 (two) times daily. 180 tablet 3  . Multiple Vitamin (MULTIVITAMIN WITH MINERALS) TABS tablet Take 1 tablet by mouth daily.    . niacin (NIASPAN) 1000 MG CR tablet Take 1 tablet (1,000 mg total) by mouth at bedtime. 90 tablet 3   No current facility-administered medications on file prior to visit.    Past Medical History  Diagnosis Date  . Coronary artery disease 02/2009    severe left main and three-vessel, CABG  . Colonic polyp 2011 last colo    colo q 23yr - [facility]   . Diverticulosis of colon   . DEGENERATIVE JOINT DISEASE   . VITAMIN D DEFICIENCY   . DIVERTICULOSIS, COLON   . HYPERLIPIDEMIA-MIXED   . HYPERTENSION, BENIGN   . Type II or unspecified type diabetes mellitus without mention of complication, not stated as uncontrolled   . LBP (low back pain)     s/p decompression 8/14; ESI - [place]; RFA x 3 early 2015    Past Surgical History  Procedure Laterality Date  . Knee arthroscopy  2007    right  . Lumbar epidural injection  2013    Has as needed.  Marland Kitchen  Coronary artery bypass graft  03/17/09    x 4  . Inguinal hernia repair  1980  . Carpal tunnel release Right   . Trigger finger release Right   . Lumbar laminectomy/decompression microdiscectomy N/A 06/19/2013    Procedure: LUMBAR LAMINECTOMY/DECOMPRESSION MICRODISCECTOMY;  Surgeon: Emilee HeroMark Leonard Dumonski, MD;  [facility];  Service: Orthopedics;  Laterality: N/A;  Lumbar 4-5 decompression  . Cataract extraction, bilateral      R 07/07/13, L 07/21/13    Social History  reports that he has never smoked. He has never used smokeless tobacco. He reports that he drinks alcohol. He reports that he does not use illicit drugs.  Family History family history includes Diabetes in his other; Heart  failure in his father and mother. There is no history of Heart disease.   Review of Systems  Constitutional: Negative.   Respiratory: Negative.   Cardiovascular: Negative.   Gastrointestinal: Negative.   Musculoskeletal: Negative.   Neurological: Negative.   Hematological: Negative.   Psychiatric/Behavioral: Negative.   All other systems reviewed and are negative.  BP 120/60 mmHg  Pulse 68  Ht 5\' 8"  (1.727 m)  Wt 184 lb 4 oz (83.575 kg)  BMI 28.02 kg/m2  Physical Exam  Constitutional: He is oriented to person, place, and time. He appears well-developed and well-nourished.  HENT:  Head: Normocephalic.  Nose: Nose normal.  Mouth/Throat: Oropharynx is clear and moist.  Eyes: Conjunctivae are normal. Pupils are equal, round, and reactive to light.  Neck: Normal range of motion. Neck supple. No JVD present.  Cardiovascular: Normal rate, regular rhythm, S1 normal, S2 normal, normal heart sounds and intact distal pulses.  Exam reveals no gallop and no friction rub.   No murmur heard. Pulmonary/Chest: Effort normal and breath sounds normal. No respiratory distress. He has no wheezes. He has no rales. He exhibits no tenderness.  Abdominal: Soft. Bowel sounds are normal. He exhibits no distension. There is no tenderness.  Musculoskeletal: Normal range of motion. He exhibits no edema or tenderness.  Lymphadenopathy:    He has no cervical adenopathy.  Neurological: He is alert and oriented to person, place, and time. Coordination normal.  Skin: Skin is warm and dry. No rash noted. No erythema.  Psychiatric: He has a normal mood and affect. His behavior is normal. Judgment and thought content normal.      Assessment and Plan   Nursing note and vitals reviewed.

## 2015-10-07 NOTE — Assessment & Plan Note (Signed)
Cholesterol has been climbing, recommended that he add zetia 10 mg daily when this goes generic March 2017 to reach goal LDL less than 70

## 2015-10-07 NOTE — Assessment & Plan Note (Signed)
Blood pressure is well controlled on today's visit. No changes made to the medications. 

## 2015-10-07 NOTE — Patient Instructions (Addendum)
You are doing well.  Please start zetia one a day for cholesterol when it goes generic in March Stay on your lipitor  Please call us if you have new issues that need to be addressed before your next appt.  Your physician wants you to follow-up in: 6 months.  You will receive a reminder letter in the mail two months in advance. If you don't receive a letter, please call our office to schedule the follow-up appointment.

## 2015-10-07 NOTE — Assessment & Plan Note (Signed)
Stable blood pressure, rare episodes of orthostasis. No changes to his medications If he does have worsening orthostasis, would decrease carvedilol down to 3.125 mg daily Currently takes this twice a day

## 2015-10-13 ENCOUNTER — Telehealth: Payer: Self-pay

## 2015-10-13 NOTE — Telephone Encounter (Signed)
Received cardiac clearance request for pt to proceed w/ colonoscopy on 10/21/15. Per Dr. Mariah MillingGollan, pt is cleared to proceed and may hold Plavix 5 days prior to procedure, if polyps are removed, restart 2 days after. Faxed to [facility] @ [phone].

## 2015-10-13 NOTE — Telephone Encounter (Signed)
Received cardiac clearance request for pt to proceed w/ lumbar facet injections. Per Dr. Mariah MillingGollan, pt is cleared to proceed and may hold Plavix 5 days prior to procedure. [facility] @ [phone].

## 2015-10-21 LAB — HM COLONOSCOPY: HM Colonoscopy: NORMAL

## 2015-11-01 ENCOUNTER — Other Ambulatory Visit: Payer: Self-pay | Admitting: Cardiovascular Disease

## 2015-11-01 ENCOUNTER — Other Ambulatory Visit: Payer: Self-pay | Admitting: Internal Medicine

## 2015-11-15 ENCOUNTER — Encounter: Payer: Self-pay | Admitting: Internal Medicine

## 2015-12-18 ENCOUNTER — Other Ambulatory Visit: Payer: Self-pay | Admitting: Internal Medicine

## 2015-12-18 ENCOUNTER — Other Ambulatory Visit: Payer: Self-pay | Admitting: Cardiovascular Disease

## 2015-12-20 NOTE — Telephone Encounter (Signed)
Requested Prescriptions   Pending Prescriptions Disp Refills  . carvedilol (COREG) 6.25 MG tablet [Pharmacy Med Name: CARVEDILOL (IR) TABS 6.25MG ] 90 tablet 3    Sig: TAKE ONE-HALF (1/2) TABLET TWICE A DAY WITH MEALS

## 2015-12-21 ENCOUNTER — Telehealth: Payer: Self-pay | Admitting: Cardiovascular Disease

## 2015-12-21 NOTE — Telephone Encounter (Signed)
Received cardiac clearance request for pt to proceed w/ lumbar facet injections w/ [facility]. They are awaiting clearance before scheduling procedure w/ instructions for pt holding Plavix. Placed on Dr. Windell Hummingbird desk for review.

## 2015-12-23 NOTE — Telephone Encounter (Signed)
Routed to [facility] @ [phone]. 

## 2015-12-23 NOTE — Telephone Encounter (Signed)
Okay to hold Plavix 5 days prior to procedure Acceptable risk for surgery

## 2016-02-02 ENCOUNTER — Telehealth: Payer: Self-pay | Admitting: Cardiovascular Disease

## 2016-02-02 NOTE — Telephone Encounter (Signed)
Pt c/o medication issue:  1. Name of Medication: Zetia   2. How are you currently taking this medication (dosage and times per day)? Not yet taking   3. Are you having a reaction (difficulty breathing--STAT)? No   4. What is your medication issue?  Pt was told to start taking this medication when it became generic. Not sure if he is suppose to take this along with the other mediations he is taking or if he is to stop the other ones then start this on Please advise

## 2016-02-02 NOTE — Telephone Encounter (Signed)
Spoke w/ pt.  Advised him that Zetia will be taken in addition to his other meds. He would like to wait until his appt w/ Dr. Mariah MillingGollan in June before starting, as he is concerned about the price right now.

## 2016-03-22 ENCOUNTER — Encounter: Payer: Self-pay | Admitting: Internal Medicine

## 2016-03-22 ENCOUNTER — Other Ambulatory Visit (INDEPENDENT_AMBULATORY_CARE_PROVIDER_SITE_OTHER): Payer: Medicare Other

## 2016-03-22 ENCOUNTER — Ambulatory Visit (INDEPENDENT_AMBULATORY_CARE_PROVIDER_SITE_OTHER): Payer: Medicare Other | Admitting: Internal Medicine

## 2016-03-22 VITALS — BP 130/84 | HR 62 | Temp 98.7°F | Resp 16 | Wt 181.0 lb

## 2016-03-22 DIAGNOSIS — E785 Hyperlipidemia, unspecified: Secondary | ICD-10-CM

## 2016-03-22 DIAGNOSIS — I25709 Atherosclerosis of coronary artery bypass graft(s), unspecified, with unspecified angina pectoris: Secondary | ICD-10-CM | POA: Diagnosis not present

## 2016-03-22 DIAGNOSIS — I10 Essential (primary) hypertension: Secondary | ICD-10-CM | POA: Diagnosis not present

## 2016-03-22 DIAGNOSIS — E1149 Type 2 diabetes mellitus with other diabetic neurological complication: Secondary | ICD-10-CM

## 2016-03-22 DIAGNOSIS — M5137 Other intervertebral disc degeneration, lumbosacral region: Secondary | ICD-10-CM

## 2016-03-22 LAB — COMPREHENSIVE METABOLIC PANEL
ALBUMIN: 4.3 g/dL (ref 3.5–5.2)
ALK PHOS: 57 U/L (ref 39–117)
ALT: 17 U/L (ref 0–53)
AST: 17 U/L (ref 0–37)
BILIRUBIN TOTAL: 0.6 mg/dL (ref 0.2–1.2)
BUN: 26 mg/dL — AB (ref 6–23)
CALCIUM: 9.4 mg/dL (ref 8.4–10.5)
CO2: 27 mEq/L (ref 19–32)
CREATININE: 1.1 mg/dL (ref 0.40–1.50)
Chloride: 105 mEq/L (ref 96–112)
GFR: 68.5 mL/min (ref >60.00)
Glucose, Bld: 122 mg/dL — ABNORMAL HIGH (ref 70–99)
Potassium: 4.3 mEq/L (ref 3.5–5.1)
SODIUM: 139 meq/L (ref 135–145)
TOTAL PROTEIN: 6.8 g/dL (ref 6.0–8.3)

## 2016-03-22 LAB — LIPID PANEL
CHOLESTEROL: 152 mg/dL (ref 0–200)
HDL: 48.2 mg/dL (ref >39.00)
LDL Cholesterol: 75 mg/dL (ref 0–99)
NONHDL: 103.4
Total CHOL/HDL Ratio: 3
Triglycerides: 141 mg/dL (ref 0.0–149.0)
VLDL: 28.2 mg/dL (ref 0.0–40.0)

## 2016-03-22 LAB — HEMOGLOBIN A1C: HEMOGLOBIN A1C: 6.7 % — AB (ref 4.6–6.5)

## 2016-03-22 NOTE — Progress Notes (Signed)
Subjective:    [name], male    DOB: August 25, 1934, 82 y.o.   MRN: [id number]  HPI He is here to establish with a new pcp.   He is here for follow up.  CAD s/p CABG, Hypertension: He is following with cardiology.  He is taking his medication daily. He is compliant with a low sodium diet.  He denies chest pain, palpitations, edema, shortness of breath and regular headaches. He is not exercising regularly.  He does not monitor his blood pressure at home.    Hyperlipidemia: He is taking his medication daily. He is compliant with a low fat/cholesterol diet. He is not exercising regularly due to back pain.  He does his own yard work. He denies myalgias.   Diabetes: He is taking his medication daily as prescribed. He is compliant with a diabetic diet. He is not exercising regularly. He rarely checks his sugars at home.  He checks his feet daily and denies foot lesions. He is up-to-date with an ophthalmology examination.   He takes celebrex daily and hydrocodone as needed for his chronic back pain.  He only takes this when he is active with yard work.    Medications and allergies reviewed with patient and updated if appropriate.  Patient Active Problem List   Diagnosis Date Noted  . Degeneration of lumbar or lumbosacral intervertebral disc 03/23/2015  . Orthostatic hypotension 03/27/2013  . Generalized muscle ache 05/28/2012  . Vitamin D deficiency 11/03/2010  . DIVERTICULOSIS, COLON 11/03/2010  . Osteoarthritis (arthritis due to wear and tear of joints) 11/03/2010  . Hyperlipidemia 12/01/2009  . HYPERTENSION, BENIGN 12/01/2009  . CAD, ARTERY BYPASS GRAFT 12/01/2009  . Type 2 diabetes mellitus with neurological manifestations, controlled (HCC)     Current Outpatient Prescriptions on File Prior to Visit  Medication Sig Dispense Refill  . aspirin EC 81 MG tablet Take 81 mg by mouth daily.    Marland Kitchen. atorvastatin (LIPITOR) 20 MG tablet TAKE 1 TABLET DAILY AT 6 P.M. 90 tablet 1  .  carvedilol (COREG) 6.25 MG tablet TAKE ONE-HALF (1/2) TABLET TWICE A DAY WITH MEALS 90 tablet 3  . celecoxib (CELEBREX) 200 MG capsule Take 200 mg by mouth 2 (two) times daily.    . cholecalciferol (VITAMIN D) 1000 UNITS tablet Take 1,000 Units by mouth daily.    . clopidogrel (PLAVIX) 75 MG tablet TAKE 1 TABLET DAILY 90 tablet 3  . HYDROcodone-acetaminophen (NORCO/VICODIN) 5-325 MG per tablet Take 1 tablet by mouth every 6 (six) hours as needed.     . metFORMIN (GLUCOPHAGE-XR) 500 MG 24 hr tablet Take 1 tablet (500 mg total) by mouth 2 (two) times daily. 180 tablet 3  . Multiple Vitamin (MULTIVITAMIN WITH MINERALS) TABS tablet Take 1 tablet by mouth daily.    . niacin (NIASPAN) 1000 MG CR tablet TAKE 1 TABLET AT BEDTIME 90 tablet 2   No current facility-administered medications on file prior to visit.    Past Medical History  Diagnosis Date  . Coronary artery disease 02/2009    severe left main and three-vessel, CABG  . Colonic polyp 2011 last colo    colo q 10520yr - [facility]   . Diverticulosis of colon   . DEGENERATIVE JOINT DISEASE   . VITAMIN D DEFICIENCY   . DIVERTICULOSIS, COLON   . HYPERLIPIDEMIA-MIXED   . HYPERTENSION, BENIGN   . Type II or unspecified type diabetes mellitus without mention of complication, not stated as uncontrolled   . LBP (low  back pain)     s/p decompression 8/14; ESI - [place]; RFA x 3 early 2015    Past Surgical History  Procedure Laterality Date  . Knee arthroscopy  2007    right  . Lumbar epidural injection  2013    Has as needed.  . Coronary artery bypass graft  03/17/09    x 4  . Inguinal hernia repair  1980  . Carpal tunnel release Right   . Trigger finger release Right   . Lumbar laminectomy/decompression microdiscectomy N/A 06/19/2013    Procedure: LUMBAR LAMINECTOMY/DECOMPRESSION MICRODISCECTOMY;  Surgeon: Emilee Hero, MD;  Location: [facility] OR;  Service: Orthopedics;  Laterality: N/A;  Lumbar 4-5 decompression  . Cataract extraction,  bilateral      R 07/07/13, L 07/21/13    Social History   Social History  . Marital Status: Married    Spouse Name: N/A  . Number of Children: N/A  . Years of Education: N/A   Social History Main Topics  . Smoking status: Never Smoker   . Smokeless tobacco: Never Used     Comment: Married, 3 grown children. Retired 04/2002 from [employer] work  . Alcohol Use: Yes  . Drug Use: No  . Sexual Activity: Not on file   Other Topics Concern  . Not on file   Social History Narrative    Family History  Problem Relation Age of Onset  . Heart failure Mother     died age 35  . Heart failure Father     died age 14  . Heart disease Neg Hx   . Diabetes Other     Review of Systems  Constitutional: Negative for fever, appetite change and fatigue.  Respiratory: Negative for cough, shortness of breath (with stairs only - likely normal for his activity level) and wheezing.   Cardiovascular: Negative for chest pain, palpitations and leg swelling.  Musculoskeletal: Positive for back pain.  Neurological: Positive for light-headedness (rare). Negative for dizziness, weakness, numbness and headaches.       Poor balance       Objective:   Filed Vitals:   03/22/16 0800  BP: 130/84  Pulse: 62  Temp: 98.7 F (37.1 C)  Resp: 16   Filed Weights   03/22/16 0800  Weight: 181 lb (82.101 kg)   Body mass index is 27.53 kg/(m^2).   Physical Exam Constitutional: Appears well-developed and well-nourished. No distress.  Neck: Neck supple. No tracheal deviation present. No thyromegaly present.  No carotid bruit. No cervical adenopathy.   Cardiovascular: Normal rate, regular rhythm and normal heart sounds.   No murmur heard.  Trace LLE edema Pulmonary/Chest: Effort normal and breath sounds normal. No respiratory distress. No wheezes.       Assessment & Plan:    See Problem List for Assessment and Plan of chronic medical problems.

## 2016-03-22 NOTE — Assessment & Plan Note (Addendum)
Following with orthopedics - gets injections and takes hydrocodone as needed Takes celebrex

## 2016-03-22 NOTE — Progress Notes (Signed)
Pre visit review using our clinic review tool, if applicable. No additional management support is needed unless otherwise documented below in the visit note. 

## 2016-03-22 NOTE — Assessment & Plan Note (Signed)
BP well controlled Current regimen effective and well tolerated Continue current medications at current doses  

## 2016-03-22 NOTE — Patient Instructions (Addendum)

## 2016-04-05 ENCOUNTER — Ambulatory Visit (INDEPENDENT_AMBULATORY_CARE_PROVIDER_SITE_OTHER): Payer: Medicare Other | Admitting: Cardiovascular Disease

## 2016-04-05 ENCOUNTER — Encounter: Payer: Self-pay | Admitting: Cardiovascular Disease

## 2016-04-05 VITALS — BP 94/60 | HR 68 | Ht 68.0 in | Wt 184.2 lb

## 2016-04-05 DIAGNOSIS — I10 Essential (primary) hypertension: Secondary | ICD-10-CM | POA: Diagnosis not present

## 2016-04-05 DIAGNOSIS — I25709 Atherosclerosis of coronary artery bypass graft(s), unspecified, with unspecified angina pectoris: Secondary | ICD-10-CM

## 2016-04-05 DIAGNOSIS — E1149 Type 2 diabetes mellitus with other diabetic neurological complication: Secondary | ICD-10-CM | POA: Diagnosis not present

## 2016-04-05 DIAGNOSIS — E785 Hyperlipidemia, unspecified: Secondary | ICD-10-CM | POA: Diagnosis not present

## 2016-04-05 NOTE — Patient Instructions (Addendum)
You are doing well. No medication changes were made.  Try to stay hydrated  Please call us if you have new issues that need to be addressed before your next appt.  Your physician wants you to follow-up in: 6 months.  You will receive a reminder letter in the mail two months in advance. If you don't receive a letter, please call our office to schedule the follow-up appointment.

## 2016-04-05 NOTE — Progress Notes (Signed)
Patient ID: Andrew BartersCraig [name], male   DOB: 1933-12-29, 82 y.o.   MRN: [id number] Cardiology Office Note  Date:  04/05/2016   ID:  Andrew Bartersraig [name], DOB 1933-12-29, MRN [id number]  PCP:  Pincus SanesStacy J Burns, MD   Chief Complaint  Patient presents with  . other    6 month f/u. Meds reviewed verbally.    HPI:  Mr. Andrew GivensSavage is a very pleasant 82 year old gentleman with history of coronary artery disease, bypass in May 2010, hyperlipidemia, diabetes, hypertension, who presents for follow-up of his coronary artery disease  In follow-up today, he reports that he is doing well No regular exercise program He is working in his garden  hemoglobin A1c 6.7, total cholesterol improved down to 152 Weight has been stable Trace ankle edema on the left lower extremity  Blood pressure low today, takes carvedilol 3.125 mill grams twice a day Concerned about excessive bruising would like to cut back on his Plavix  EKG on today's visit shows normal sinus rhythm with rate 68 bpm, right bundle branch block  Other past medical history  History of a pinched nerve, back surgery 06/19/2013. He continues to have back pain, status post [facility] treatments [facility] through [facility]. Biggest complaint is arthritis in his hips, back, knees. He reports that he has tried Cialis and Viagra in the past for erectile dysfunction. This did not work for him  Previous total cholesterol 141, LDL 67  PMH:   has a past medical history of Coronary artery disease (02/2009); Colonic polyp (2011 last colo); Diverticulosis of colon; DEGENERATIVE JOINT DISEASE; VITAMIN D DEFICIENCY; DIVERTICULOSIS, COLON; HYPERLIPIDEMIA-MIXED; HYPERTENSION, BENIGN; Type II or unspecified type diabetes mellitus without mention of complication, not stated as uncontrolled; and LBP (low back pain).  PSH:    Past Surgical History  Procedure Laterality Date  . Knee arthroscopy  2007    right  . Lumbar epidural injection  2013    Has as needed.  .  Coronary artery bypass graft  03/17/09    x 4  . Inguinal hernia repair  1980  . Carpal tunnel release Right   . Trigger finger release Right   . Lumbar laminectomy/decompression microdiscectomy N/A 06/19/2013    Procedure: LUMBAR LAMINECTOMY/DECOMPRESSION MICRODISCECTOMY;  Surgeon: Emilee HeroMark Leonard Dumonski, MD;  [facility];  Service: Orthopedics;  Laterality: N/A;  Lumbar 4-5 decompression  . Cataract extraction, bilateral      R 07/07/13, L 07/21/13    Current Outpatient Prescriptions  Medication Sig Dispense Refill  . aspirin EC 81 MG tablet Take 81 mg by mouth daily.    Andrew [name]. atorvastatin (LIPITOR) 20 MG tablet TAKE 1 TABLET DAILY AT 6 P.M. 90 tablet 1  . carvedilol (COREG) 6.25 MG tablet TAKE ONE-HALF (1/2) TABLET TWICE A DAY WITH MEALS 90 tablet 3  . celecoxib (CELEBREX) 200 MG capsule Take 200 mg by mouth 2 (two) times daily.    . cholecalciferol (VITAMIN D) 1000 UNITS tablet Take 1,000 Units by mouth daily.    . clopidogrel (PLAVIX) 75 MG tablet TAKE 1 TABLET DAILY 90 tablet 3  . HYDROcodone-acetaminophen (NORCO/VICODIN) 5-325 MG per tablet Take 1 tablet by mouth every 6 (six) hours as needed.     . metFORMIN (GLUCOPHAGE-XR) 500 MG 24 hr tablet Take 1 tablet (500 mg total) by mouth 2 (two) times daily. 180 tablet 3  . Multiple Vitamin (MULTIVITAMIN WITH MINERALS) TABS tablet Take 1 tablet by mouth daily.    . niacin (NIASPAN) 1000 MG CR tablet TAKE 1 TABLET  AT BEDTIME 90 tablet 2   No current facility-administered medications for this visit.     Allergies:   Penicillins   Social History:  The patient  reports that he has never smoked. He has never used smokeless tobacco. He reports that he drinks alcohol. He reports that he does not use illicit drugs.   Family History:   family history includes Diabetes in his other; Heart failure in his father and mother. There is no history of Heart disease.    Review of Systems: Review of Systems  Constitutional: Negative.   Respiratory:  Negative.   Cardiovascular: Negative.   Gastrointestinal: Negative.   Musculoskeletal: Negative.   Skin:       Excessive bruising  Neurological: Negative.   Psychiatric/Behavioral: Negative.   All other systems reviewed and are negative.    PHYSICAL EXAM: VS:  BP 94/60 mmHg  Pulse 68  Ht  (1.727 m)  Wt 184 lb 4 oz (83.575 kg)  BMI 28.02 kg/m2 GEN: Well nourished, well developed, in no acute distress HEENT: normal Neck: no JVD, carotid bruits, or masses Cardiac: RRR; no murmurs, rubs, or gallops, trace edema of the left lower extremity Respiratory:  clear to auscultation bilaterally, normal work of breathing GI: soft, nontender, nondistended, + BS MS: no deformity or atrophy Skin: warm and dry, no rash Neuro:  Strength and sensation are intact Psych: euthymic mood, full affect    Recent Labs: 09/22/2015: Hemoglobin 13.7; Platelets 219.0; TSH 1.00 03/22/2016: ALT 17; BUN 26*; Creatinine, Ser 1.10; Potassium 4.3; Sodium 139    Lipid Panel Lab Results  Component Value Date   CHOL 152 03/22/2016   HDL 48.20 03/22/2016   LDLCALC 75 03/22/2016   TRIG 141.0 03/22/2016      Wt Readings from Last 3 Encounters:  04/05/16 184 lb 4 oz (83.575 kg)  03/22/16 181 lb (82.101 kg)  10/07/15 184 lb 4 oz (83.575 kg)       ASSESSMENT AND PLAN:  HYPERTENSION, BENIGN - Plan: EKG 12-Lead Very low blood pressure on today's visit, suspect mild dehydration from working in his garden Recommended he increase his fluid intake If blood pressure continues to run low, we'll decrease carvedilol down to 3.125 mg daily  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 No further workup at this time. Continue current medication regimen.  Hyperlipidemia Cholesterol is at goal on the current lipid regimen. No changes to the medications were made.  Type 2 diabetes mellitus with  neurological manifestations, controlled (HCC) Recommended strict diet, mild weight loss Hemoglobin A1c mildly elevated  Disposition:   F/U  6 months   Total encounter time more than 15 minutes  Greater than 50% was spent in counseling and coordination of care with the patient    Orders Placed This Encounter  Procedures  . EKG 12-Lead     Signed, Dossie Arbour, M.D., Ph.D. 04/05/2016  [facility] [place], [place] [phone]

## 2016-04-13 ENCOUNTER — Telehealth: Payer: Self-pay | Admitting: Cardiovascular Disease

## 2016-04-13 NOTE — Telephone Encounter (Signed)
Received anticoagulation clearance request for pt to proceed w/ lumbar facet injections. Date has not yet been scheduled. [facility] requests recommendations on holding pt's Plavix prior to procedure. Please fax response to [phone], attn: x-ray.

## 2016-04-14 NOTE — Telephone Encounter (Signed)
Would stop Plavix 5 days prior to procedure

## 2016-04-14 NOTE — Telephone Encounter (Signed)
Routed to [facility] @ [phone].

## 2016-04-26 ENCOUNTER — Telehealth: Payer: Self-pay

## 2016-04-26 NOTE — Telephone Encounter (Signed)
Can we change the appt pt has sched with you to an AWV?

## 2016-04-26 NOTE — Telephone Encounter (Signed)
Can we schedule him with Andrew [name] the same day for a AWV so he has back to back appt's?

## 2016-04-27 NOTE — Telephone Encounter (Signed)
See PCP note below.

## 2016-05-09 ENCOUNTER — Other Ambulatory Visit: Payer: Self-pay | Admitting: Internal Medicine

## 2016-08-30 ENCOUNTER — Telehealth: Payer: Self-pay

## 2016-08-30 NOTE — Telephone Encounter (Signed)
Call to fup AWV Agreed to come in 12/5th at 8am There were no AM apt in Nov; on Tuesday and Thursday.  Will be seeing Dr. Lawerance BachBurns on the 4th

## 2016-08-31 ENCOUNTER — Telehealth: Payer: Self-pay | Admitting: Cardiovascular Disease

## 2016-08-31 NOTE — Telephone Encounter (Signed)
Received cardiac clearance request for pt to proceed w/ lumbar facet injections w/ [facility]. DOS has not been scheduled yet pending this clearance.  They would like permission to hold pt's Plavix prior to procedure and request direction on how long to hold. Please route clearance and recommendation to [facility] @ [phone], Attn: x-ray.

## 2016-09-02 NOTE — Telephone Encounter (Signed)
Acceptable risk for orthopedic procedure Would hold Plavix seven-days prior to procedure

## 2016-09-04 NOTE — Telephone Encounter (Signed)
Routed to fax # provided. 

## 2016-09-08 LAB — HM DIABETES EYE EXAM

## 2016-09-21 ENCOUNTER — Ambulatory Visit: Payer: Medicare Other | Admitting: Internal Medicine

## 2016-09-21 ENCOUNTER — Other Ambulatory Visit: Payer: Self-pay | Admitting: Internal Medicine

## 2016-09-25 ENCOUNTER — Other Ambulatory Visit (INDEPENDENT_AMBULATORY_CARE_PROVIDER_SITE_OTHER): Payer: Medicare Other

## 2016-09-25 ENCOUNTER — Ambulatory Visit (INDEPENDENT_AMBULATORY_CARE_PROVIDER_SITE_OTHER): Payer: Medicare Other | Admitting: Internal Medicine

## 2016-09-25 ENCOUNTER — Encounter: Payer: Self-pay | Admitting: Internal Medicine

## 2016-09-25 VITALS — BP 150/96 | HR 74 | Wt 185.0 lb

## 2016-09-25 DIAGNOSIS — I10 Essential (primary) hypertension: Secondary | ICD-10-CM

## 2016-09-25 DIAGNOSIS — E1149 Type 2 diabetes mellitus with other diabetic neurological complication: Secondary | ICD-10-CM | POA: Diagnosis not present

## 2016-09-25 DIAGNOSIS — M5137 Other intervertebral disc degeneration, lumbosacral region: Secondary | ICD-10-CM

## 2016-09-25 DIAGNOSIS — I25709 Atherosclerosis of coronary artery bypass graft(s), unspecified, with unspecified angina pectoris: Secondary | ICD-10-CM | POA: Diagnosis not present

## 2016-09-25 DIAGNOSIS — E7800 Pure hypercholesterolemia, unspecified: Secondary | ICD-10-CM

## 2016-09-25 DIAGNOSIS — Z974 Presence of external hearing-aid: Secondary | ICD-10-CM | POA: Insufficient documentation

## 2016-09-25 LAB — MICROALBUMIN / CREATININE URINE RATIO
CREATININE, U: 47.6 mg/dL
MICROALB/CREAT RATIO: 1.5 mg/g (ref 0.0–30.0)
Microalb, Ur: <0.7 mg/dL (ref 0.0–1.9)

## 2016-09-25 LAB — COMPREHENSIVE METABOLIC PANEL
ALBUMIN: 4.1 g/dL (ref 3.5–5.2)
ALK PHOS: 61 U/L (ref 39–117)
ALT: 17 U/L (ref 0–53)
AST: 13 U/L (ref 0–37)
BUN: 29 mg/dL — ABNORMAL HIGH (ref 6–23)
CO2: 28 mEq/L (ref 19–32)
Calcium: 9.3 mg/dL (ref 8.4–10.5)
Chloride: 104 mEq/L (ref 96–112)
Creatinine, Ser: 1.26 mg/dL (ref 0.40–1.50)
GFR: 58.49 mL/min — AB (ref >60.00)
Glucose, Bld: 106 mg/dL — ABNORMAL HIGH (ref 70–99)
POTASSIUM: 4.3 meq/L (ref 3.5–5.1)
SODIUM: 140 meq/L (ref 135–145)
Total Bilirubin: 0.4 mg/dL (ref 0.2–1.2)
Total Protein: 6.9 g/dL (ref 6.0–8.3)

## 2016-09-25 LAB — HEMOGLOBIN A1C: Hgb A1c MFr Bld: 6.6 % — ABNORMAL HIGH (ref 4.6–6.5)

## 2016-09-25 MED ORDER — NIACIN ER (ANTIHYPERLIPIDEMIC) 1000 MG PO TBCR: 1000.0000 mg | EXTENDED_RELEASE_TABLET | Freq: Every day | ORAL | 3 refills | Status: DC

## 2016-09-25 NOTE — Progress Notes (Signed)
Subjective:    [name], male    DOB: 12-02-1933, 82 y.o.   MRN: [id number]  HPI The patient is here for follow up.  CAD, Hypertension: He is taking his medication daily. He is compliant with a low sodium diet.  He denies chest pain, palpitations, edema, shortness of breath and regular headaches. He is not exercising regularly.  He does not monitor his blood pressure at home.    Hyperlipidemia: He is taking his medication daily. He is compliant with a low fat/cholesterol diet. He is not exercising regularly. He denies myalgias.   Diabetes: He is taking his medication daily as prescribed. He is compliant with a diabetic diet. He is not exercising regularly.  He does not check his feet daily and denies foot lesions. He is up-to-date with an ophthalmology examination.   When he walks he feels he stumbles.  He is unsure if he is dragging his feet or not picking up his feet completely.  He does have a bulging disc in his back.  He just got his injections last week and has no back pain.  He is unsure if it is one foot or both feet and he is unsure if it improves with having the injections.  He has not discussed it with his orthopedic.   Medications and allergies reviewed with patient and updated if appropriate.  Patient Active Problem List   Diagnosis Date Noted  . Degeneration of lumbar or lumbosacral intervertebral disc 03/23/2015  . Orthostatic hypotension 03/27/2013  . Generalized muscle ache 05/28/2012  . Vitamin D deficiency 11/03/2010  . DIVERTICULOSIS, COLON 11/03/2010  . Osteoarthritis (arthritis due to wear and tear of joints) 11/03/2010  . Hyperlipidemia 12/01/2009  . HYPERTENSION, BENIGN 12/01/2009  . CAD, ARTERY BYPASS GRAFT 12/01/2009  . Type 2 diabetes mellitus with neurological manifestations, controlled (HCC)     Current Outpatient Prescriptions on File Prior to Visit  Medication Sig Dispense Refill  . aspirin EC 81 MG tablet Take 81 mg by mouth daily.     Marland Kitchen. atorvastatin (LIPITOR) 20 MG tablet TAKE 1 TABLET DAILY AT 6 P.M. 90 tablet 1  . carvedilol (COREG) 6.25 MG tablet TAKE ONE-HALF (1/2) TABLET TWICE A DAY WITH MEALS 90 tablet 3  . celecoxib (CELEBREX) 200 MG capsule Take 200 mg by mouth 2 (two) times daily.    . cholecalciferol (VITAMIN D) 1000 UNITS tablet Take 1,000 Units by mouth daily.    . clopidogrel (PLAVIX) 75 MG tablet TAKE 1 TABLET DAILY 90 tablet 3  . HYDROcodone-acetaminophen (NORCO/VICODIN) 5-325 MG per tablet Take 1 tablet by mouth every 6 (six) hours as needed.     . metFORMIN (GLUCOPHAGE-XR) 500 MG 24 hr tablet TAKE 1 TABLET TWICE A DAY 180 tablet 2  . Multiple Vitamin (MULTIVITAMIN WITH MINERALS) TABS tablet Take 1 tablet by mouth daily.    . niacin (NIASPAN) 1000 MG CR tablet Take 1 tablet (1,000 mg total) by mouth at bedtime. Overdue for yearly physical w/labs must see MD for refills 30 tablet 0   No current facility-administered medications on file prior to visit.     Past Medical History:  Diagnosis Date  . Colonic polyp 2011 last colo   colo q 4425yr - [facility]   . Coronary artery disease 02/2009   severe left main and three-vessel, CABG  . DEGENERATIVE JOINT DISEASE   . Diverticulosis of colon   . DIVERTICULOSIS, COLON   . HYPERLIPIDEMIA-MIXED   . HYPERTENSION, BENIGN   .  LBP (low back pain)    s/p decompression 8/14; ESI - [place]; RFA x 3 early 2015  . Type II or unspecified type diabetes mellitus without mention of complication, not stated as uncontrolled   . VITAMIN D DEFICIENCY     Past Surgical History:  Procedure Laterality Date  . CARPAL TUNNEL RELEASE Right   . CATARACT EXTRACTION, BILATERAL     R 07/07/13, L 07/21/13  . CORONARY ARTERY BYPASS GRAFT  03/17/09   x 4  . INGUINAL HERNIA REPAIR  1980  . KNEE ARTHROSCOPY  2007   right  . LUMBAR EPIDURAL INJECTION  2013   Has as needed.  . LUMBAR LAMINECTOMY/DECOMPRESSION MICRODISCECTOMY N/A 06/19/2013   Procedure: LUMBAR LAMINECTOMY/DECOMPRESSION  MICRODISCECTOMY;  Surgeon: Emilee HeroMark Leonard Dumonski, MD;  [facility];  Service: Orthopedics;  Laterality: N/A;  Lumbar 4-5 decompression  . TRIGGER FINGER RELEASE Right     Social History   Social History  . Marital status: Married    Spouse name: N/A  . Number of children: N/A  . Years of education: N/A   Social History Main Topics  . Smoking status: Never Smoker  . Smokeless tobacco: Never Used     Comment: Married, 3 grown children. Retired 04/2002 from [employer] work  . Alcohol use Yes  . Drug use: No  . Sexual activity: Not Asked   Other Topics Concern  . None   Social History Narrative  . None    Family History  Problem Relation Age of Onset  . Heart failure Mother     died age 80  . Heart failure Father     died age 80  . Heart disease Neg Hx   . Diabetes Other     Review of Systems  Constitutional: Negative for fever.  Respiratory: Negative for cough, shortness of breath and wheezing.   Cardiovascular: Negative for chest pain, palpitations and leg swelling.  Neurological: Negative for light-headedness and headaches.       Objective:   Vitals:   09/25/16 1441  BP: (!) 150/96  Pulse: 74   Filed Weights   09/25/16 1441  Weight: 185 lb (83.9 kg)   Body mass index is 28.13 kg/m.   Physical Exam    Constitutional: Appears well-developed and well-nourished. No distress.  HENT:  Head: Normocephalic and atraumatic.  Neck: Neck supple. No tracheal deviation present. No thyromegaly present.  No cervical lymphadenopathy Cardiovascular: Normal rate, regular rhythm and normal heart sounds.   1/6 systolic murmur heard. No carotid bruit .  Trace b/l le edema Pulmonary/Chest: Effort normal and breath sounds normal. No respiratory distress. No has no wheezes. No rales.  Skin: Skin is warm and dry. Not diaphoretic.  Psychiatric: Normal mood and affect. Behavior is normal.      Assessment & Plan:    See Problem List for Assessment and  Plan of chronic medical problems.   f/u in 6 months

## 2016-09-25 NOTE — Assessment & Plan Note (Signed)
Following with ortho Just had an injection Stumbling may be related to back issues - will monitor and discuss with ortho

## 2016-09-25 NOTE — Assessment & Plan Note (Addendum)
BP elevated here today, but has been well controlled over past year  BP Readings from Last 3 Encounters:  09/25/16 (!) 150/96  04/05/16 94/60  03/22/16 130/84   Continue current medications at current dose No anginal symptoms cmp

## 2016-09-25 NOTE — Patient Instructions (Addendum)

## 2016-09-25 NOTE — Progress Notes (Signed)
Pre visit review using our clinic review tool, if applicable. No additional management support is needed unless otherwise documented below in the visit note. 

## 2016-09-25 NOTE — Assessment & Plan Note (Signed)
Check a1c, urine micro Continue metformin - will adjust if needed Will obtain eye report

## 2016-09-25 NOTE — Assessment & Plan Note (Signed)
Lipids has been well controlled Continue statin at current dose

## 2016-09-25 NOTE — Progress Notes (Addendum)
Subjective:   Andrew [name] is a 82 y.o. male who presents for Medicare Annual/Subsequent preventive examination.  The Patient was informed that the wellness visit is to identify future health risk and educate and initiate measures that can reduce risk for increased disease through the lifespan.   Describes health as fair, good or great? "good" Enjoys yard work, gardening, Physiological scientist.   Review of Systems:  No ROS.  Medicare Wellness Visit.  Cardiac Risk Factors include: advanced age (>56men, >80 women);diabetes mellitus;dyslipidemia;hypertension;male gender   Sleep patterns: Sleeps 6 hours, up to void x 2.    Home Safety/Smoke Alarms: smoke detectors in place. Living environment; residence and Firearm Safety: Lives with wife, son (40 yo) and dog in 2 story home. Firearms locked away. Seat Belt Safety/Bike Helmet: Wears seatbelt.    Counseling:   Eye Exam-Last exam 08/2016, followed yearly by [facility]. Dental-Last exam June 2017, followed every 6 months. Unable to recall providers name.    Male:   [facility]-Colonoscopy 10/21/15, diverticulosis, internal hemorrhoids. No recall d/t age. PSA- 0.600 09/22/2010     Objective:    Vitals: BP 128/66 (BP Location: Right Arm, Patient Position: Sitting, Cuff Size: Normal)   Pulse 84   Ht 5\' 8"  (1.727 m)   Wt 184 lb 0.6 oz (83.5 kg)   SpO2 96%   BMI 27.98 kg/m   Body mass index is 27.98 kg/m.  Tobacco History  Smoking Status  . Never Smoker  Smokeless Tobacco  . Never Used    Comment: Married, 3 grown children. Retired 04/2002 from [employer] work     Counseling given: Not Answered   Past Medical History:  Diagnosis Date  . Colonic polyp 2011 last colo   colo q 75yr - [facility]   . Coronary artery disease 02/2009   severe left main and three-vessel, CABG  . DEGENERATIVE JOINT DISEASE   . Diverticulosis of colon   . DIVERTICULOSIS, COLON   . HYPERLIPIDEMIA-MIXED   . HYPERTENSION, BENIGN   . LBP (low  back pain)    s/p decompression 8/14; ESI - [place]; RFA x 3 early 2015  . Type II or unspecified type diabetes mellitus without mention of complication, not stated as uncontrolled   . VITAMIN D DEFICIENCY    Past Surgical History:  Procedure Laterality Date  . CARPAL TUNNEL RELEASE Right   . CATARACT EXTRACTION, BILATERAL     R 07/07/13, L 07/21/13  . CORONARY ARTERY BYPASS GRAFT  03/17/09   x 4  . INGUINAL HERNIA REPAIR  1980  . KNEE ARTHROSCOPY  2007   right  . LUMBAR EPIDURAL INJECTION  2013   Has as needed.  . LUMBAR LAMINECTOMY/DECOMPRESSION MICRODISCECTOMY N/A 06/19/2013   Procedure: LUMBAR LAMINECTOMY/DECOMPRESSION MICRODISCECTOMY;  Surgeon: Emilee Hero, MD;  Location: [facility] OR;  Service: Orthopedics;  Laterality: N/A;  Lumbar 4-5 decompression  . TRIGGER FINGER RELEASE Right    Family History  Problem Relation Age of Onset  . Heart failure Mother     died age 90  . Heart failure Father     died age 62  . Diabetes Other   . Heart disease Neg Hx    History  Sexual Activity  . Sexual activity: Not on file    Outpatient Encounter Prescriptions as of 09/26/2016  Medication Sig  . aspirin EC 81 MG tablet Take 81 mg by mouth daily.  Marland Kitchen atorvastatin (LIPITOR) 20 MG tablet TAKE 1 TABLET DAILY AT 6 P.M.  . carvedilol (COREG)  6.25 MG tablet TAKE ONE-HALF (1/2) TABLET TWICE A DAY WITH MEALS  . celecoxib (CELEBREX) 200 MG capsule Take 200 mg by mouth 2 (two) times daily.  . cholecalciferol (VITAMIN D) 1000 UNITS tablet Take 1,000 Units by mouth daily.  . clopidogrel (PLAVIX) 75 MG tablet TAKE 1 TABLET DAILY  . HYDROcodone-acetaminophen (NORCO/VICODIN) 5-325 MG per tablet Take 1 tablet by mouth every 6 (six) hours as needed.   . metFORMIN (GLUCOPHAGE-XR) 500 MG 24 hr tablet TAKE 1 TABLET TWICE A DAY  . Multiple Vitamin (MULTIVITAMIN WITH MINERALS) TABS tablet Take 1 tablet by mouth daily.  . niacin (NIASPAN) 1000 MG CR tablet Take 1 tablet (1,000 mg total) by mouth at  bedtime.  . [DISCONTINUED] niacin (NIASPAN) 1000 MG CR tablet Take 1 tablet (1,000 mg total) by mouth at bedtime. Overdue for yearly physical w/labs must see MD for refills   No facility-administered encounter medications on file as of 09/26/2016.     Activities of Daily Living In your present state of health, do you have any difficulty performing the following activities: 09/26/2016  Hearing? N  Vision? N  Difficulty concentrating or making decisions? N  Walking or climbing stairs? N  Dressing or bathing? N  Doing errands, shopping? N  Preparing Food and eating ? N  Using the Toilet? N  In the past six months, have you accidently leaked urine? N  Do you have problems with loss of bowel control? N  Managing your Medications? N  Managing your Finances? N  Housekeeping or managing your Housekeeping? N  Some recent data might be hidden    Patient Care Team: Pincus SanesStacy J Burns, MD as PCP - General (Internal Medicine) Lunette StandsAnna Voytek, MD as Consulting Physician (Orthopedic Surgery) Loreta Aveaniel F Murphy, MD as Consulting Physician (Orthopedic Surgery) Antonieta Ibaimothy J Gollan, MD as Consulting Physician (Cardiology) Estill BambergMark Dumonski, MD (Orthopedic Surgery) Francene BoyersSteven W Bernstorf, MD (Optometry) Carman ChingJames Edwards, MD (Gastroenterology) Donzetta Starchrew Jones, MD as Consulting Physician (Dermatology)   Assessment:    Physical assessment deferred to PCP.  Exercise Activities and Dietary recommendations Current Exercise Habits: The patient does not participate in regular exercise at present (works in yard, garden), Exercise limited by: orthopedic condition(s)   Diet (meal preparation, eat out, water intake, caffeinated beverages, dairy products, fruits and vegetables): Eats at home. Drinks water and sweet tea (sugar substitute).  Breakfast: Cereal, muffins, banana Lunch: Sandwich Dinner: Stew, sandwich, meat, vegetables.   Discussed heart healthy diabetic diet. Encouraged to increase water and vegetable intake.  Encouraged  to increase activity as tolerated. Limited due to back.   Goals      Patient Stated   . patient states (pt-stated)          Maintain current health.       Fall Risk Fall Risk  09/26/2016 03/22/2016 09/22/2015 09/28/2014 09/26/2013  Falls in the past year? No No No No No   Depression Screen PHQ 2/9 Scores 09/26/2016 03/22/2016 09/22/2015 09/28/2014  PHQ - 2 Score 0 0 0 0    Cognitive Function       Ad8 score reviewed for issues:  Issues making decisions:no  Less interest in hobbies / activities:no  Repeats questions, stories (family complaining):no  Trouble using ordinary gadgets (microwave, computer, phone):no  Forgets the month or year: no  Mismanaging finances: no  Remembering appts:no  Daily problems with thinking and/or memory:no Ad8 score is=0     Immunization History  Administered Date(s) Administered  . Influenza Split 09/26/2012  . Influenza, Seasonal, Injecte, Preservative  Fre 07/23/2013  . Influenza-Unspecified 09/24/2014, 07/24/2015, 08/04/2016  . Pneumococcal Conjugate-13 09/22/2015  . Pneumococcal-Unspecified 09/25/2011  . Td 11/07/2002, 03/26/2013   Screening Tests Health Maintenance  Topic Date Due  . ZOSTAVAX  06/14/1996  . OPHTHALMOLOGY EXAM  09/08/2016  . HEMOGLOBIN A1C  03/26/2017  . FOOT EXAM  09/25/2017  . URINE MICROALBUMIN  09/25/2017  . COLONOSCOPY  10/20/2020  . TETANUS/TDAP  03/27/2023  . INFLUENZA VACCINE  Addressed  . PNA vac Low Risk Adult  Completed      Plan:     Eat heart healthy diet (full of fruits, vegetables, whole grains, lean protein, water--limit salt, fat, and sugar intake) and increase physical activity as tolerated.  Continue doing brain stimulating activities (puzzles, reading, adult coloring books, staying active) to keep memory sharp.   Bring a copy of your advance directives to your next office visit.   During the course of the visit the patient was educated and counseled about the following  appropriate screening and preventive services:   Vaccines to include Pneumoccal, Influenza, Hepatitis B, Td, Zostavax, HCV  Cardiovascular Disease  Colorectal cancer screening  Diabetes screening  Prostate Cancer Screening  Glaucoma screening  Nutrition counseling    Patient Instructions (the written plan) was given to the patient.    Alysia PennaKimberly R Sahan Pen, RN  09/26/2016    Medical screening examination/treatment/procedure(s) were performed by non-physician practitioner and as supervising physician I was immediately available for consultation/collaboration. I agree with above. Pincus SanesStacy J Burns, MD

## 2016-09-26 ENCOUNTER — Ambulatory Visit (INDEPENDENT_AMBULATORY_CARE_PROVIDER_SITE_OTHER): Payer: Medicare Other

## 2016-09-26 VITALS — BP 128/66 | HR 84 | Ht 68.0 in | Wt 184.0 lb

## 2016-09-26 DIAGNOSIS — Z0000 Encounter for general adult medical examination without abnormal findings: Secondary | ICD-10-CM

## 2016-09-26 NOTE — Patient Instructions (Addendum)
Eat heart healthy diet (full of fruits, vegetables, whole grains, lean protein, water--limit salt, fat, and sugar intake) and increase physical activity as tolerated.  Continue doing brain stimulating activities (puzzles, reading, adult coloring books, staying active) to keep memory sharp.   Bring a copy of your advance directives to your next office visit.   Fall Prevention in the Home Introduction Falls can cause injuries. They can happen to people of all ages. There are many things you can do to make your home safe and to help prevent falls. What can I do on the outside of my home?  Regularly fix the edges of walkways and driveways and fix any cracks.  Remove anything that might make you trip as you walk through a door, such as a raised step or threshold.  Trim any bushes or trees on the path to your home.  Use bright outdoor lighting.  Clear any walking paths of anything that might make someone trip, such as rocks or tools.  Regularly check to see if handrails are loose or broken. Make sure that both sides of any steps have handrails.  Any raised decks and porches should have guardrails on the edges.  Have any leaves, snow, or ice cleared regularly.  Use sand or salt on walking paths during winter.  Clean up any spills in your garage right away. This includes oil or grease spills. What can I do in the bathroom?  Use night lights.  Install grab bars by the toilet and in the tub and shower. Do not use towel bars as grab bars.  Use non-skid mats or decals in the tub or shower.  If you need to sit down in the shower, use a plastic, non-slip stool.  Keep the floor dry. Clean up any water that spills on the floor as soon as it happens.  Remove soap buildup in the tub or shower regularly.  Attach bath mats securely with double-sided non-slip rug tape.  Do not have throw rugs and other things on the floor that can make you trip. What can I do in the bedroom?  Use night  lights.  Make sure that you have a light by your bed that is easy to reach.  Do not use any sheets or blankets that are too big for your bed. They should not hang down onto the floor.  Have a firm chair that has side arms. You can use this for support while you get dressed.  Do not have throw rugs and other things on the floor that can make you trip. What can I do in the kitchen?  Clean up any spills right away.  Avoid walking on wet floors.  Keep items that you use a lot in easy-to-reach places.  If you need to reach something above you, use a strong step stool that has a grab bar.  Keep electrical cords out of the way.  Do not use floor polish or wax that makes floors slippery. If you must use wax, use non-skid floor wax.  Do not have throw rugs and other things on the floor that can make you trip. What can I do with my stairs?  Do not leave any items on the stairs.  Make sure that there are handrails on both sides of the stairs and use them. Fix handrails that are broken or loose. Make sure that handrails are as long as the stairways.  Check any carpeting to make sure that it is firmly attached to the stairs. Fix   any carpet that is loose or worn.  Avoid having throw rugs at the top or bottom of the stairs. If you do have throw rugs, attach them to the floor with carpet tape.  Make sure that you have a light switch at the top of the stairs and the bottom of the stairs. If you do not have them, ask someone to add them for you. What else can I do to help prevent falls?  Wear shoes that:  Do not have high heels.  Have rubber bottoms.  Are comfortable and fit you well.  Are closed at the toe. Do not wear sandals.  If you use a stepladder:  Make sure that it is fully opened. Do not climb a closed stepladder.  Make sure that both sides of the stepladder are locked into place.  Ask someone to hold it for you, if possible.  Clearly mark and make sure that you can  see:  Any grab bars or handrails.  First and last steps.  Where the edge of each step is.  Use tools that help you move around (mobility aids) if they are needed. These include:  Canes.  Walkers.  Scooters.  Crutches.  Turn on the lights when you go into a dark area. Replace any light bulbs as soon as they burn out.  Set up your furniture so you have a clear path. Avoid moving your furniture around.  If any of your floors are uneven, fix them.  If there are any pets around you, be aware of where they are.  Review your medicines with your doctor. Some medicines can make you feel dizzy. This can increase your chance of falling. Ask your doctor what other things that you can do to help prevent falls. This information is not intended to replace advice given to you by your health care provider. Make sure you discuss any questions you have with your health care provider. Document Released: 08/05/2009 Document Revised: 03/16/2016 Document Reviewed: 11/13/2014  2017 Elsevier  Health Maintenance, Male A healthy lifestyle and preventative care can promote health and wellness.  Maintain regular health, dental, and eye exams.  Eat a healthy diet. Foods like vegetables, fruits, whole grains, low-fat dairy products, and lean protein foods contain the nutrients you need and are low in calories. Decrease your intake of foods high in solid fats, added sugars, and salt. Get information about a proper diet from your health care provider, if necessary.  Regular physical exercise is one of the most important things you can do for your health. Most adults should get at least 150 minutes of moderate-intensity exercise (any activity that increases your heart rate and causes you to sweat) each week. In addition, most adults need muscle-strengthening exercises on 2 or more days a week.   Maintain a healthy weight. The body mass index (BMI) is a screening tool to identify possible weight problems. It  provides an estimate of body fat based on height and weight. Your health care provider can find your BMI and can help you achieve or maintain a healthy weight. For males 20 years and older:  A BMI below 18.5 is considered underweight.  A BMI of 18.5 to 24.9 is normal.  A BMI of 25 to 29.9 is considered overweight.  A BMI of 30 and above is considered obese.  Maintain normal blood lipids and cholesterol by exercising and minimizing your intake of saturated fat. Eat a balanced diet with plenty of fruits and vegetables. Blood tests for lipids   and cholesterol should begin at age 20 and be repeated every 5 years. If your lipid or cholesterol levels are high, you are over age 50, or you are at high risk for heart disease, you may need your cholesterol levels checked more frequently.Ongoing high lipid and cholesterol levels should be treated with medicines if diet and exercise are not working.  If you smoke, find out from your health care provider how to quit. If you do not use tobacco, do not start.  Lung cancer screening is recommended for adults aged 55-80 years who are at high risk for developing lung cancer because of a history of smoking. A yearly low-dose CT scan of the lungs is recommended for people who have at least a 30-pack-year history of smoking and are current smokers or have quit within the past 15 years. A pack year of smoking is smoking an average of 1 pack of cigarettes a day for 1 year (for example, a 30-pack-year history of smoking could mean smoking 1 pack a day for 30 years or 2 packs a day for 15 years). Yearly screening should continue until the smoker has stopped smoking for at least 15 years. Yearly screening should be stopped for people who develop a health problem that would prevent them from having lung cancer treatment.  If you choose to drink alcohol, do not have more than 2 drinks per day. One drink is considered to be 12 oz (360 mL) of beer, 5 oz (150 mL) of wine, or 1.5  oz (45 mL) of liquor.  Avoid the use of street drugs. Do not share needles with anyone. Ask for help if you need support or instructions about stopping the use of drugs.  High blood pressure causes heart disease and increases the risk of stroke. High blood pressure is more likely to develop in:  People who have blood pressure in the end of the normal range (100-139/85-89 mm Hg).  People who are overweight or obese.  People who are African American.  If you are 18-39 years of age, have your blood pressure checked every 3-5 years. If you are 40 years of age or older, have your blood pressure checked every year. You should have your blood pressure measured twice-once when you are at a hospital or clinic, and once when you are not at a hospital or clinic. Record the average of the two measurements. To check your blood pressure when you are not at a hospital or clinic, you can use:  An automated blood pressure machine at a pharmacy.  A home blood pressure monitor.  If you are 45-79 years old, ask your health care provider if you should take aspirin to prevent heart disease.  Diabetes screening involves taking a blood sample to check your fasting blood sugar level. This should be done once every 3 years after age 45 if you are at a normal weight and without risk factors for diabetes. Testing should be considered at a younger age or be carried out more frequently if you are overweight and have at least 1 risk factor for diabetes.  Colorectal cancer can be detected and often prevented. Most routine colorectal cancer screening begins at the age of 50 and continues through age 75. However, your health care provider may recommend screening at an earlier age if you have risk factors for colon cancer. On a yearly basis, your health care provider may provide home test kits to check for hidden blood in the stool. A small camera at the   end of a tube may be used to directly examine the colon (sigmoidoscopy or  colonoscopy) to detect the earliest forms of colorectal cancer. Talk to your health care provider about this at age 50 when routine screening begins. A direct exam of the colon should be repeated every 5-10 years through age 75, unless early forms of precancerous polyps or small growths are found.  People who are at an increased risk for hepatitis B should be screened for this virus. You are considered at high risk for hepatitis B if:  You were born in a country where hepatitis B occurs often. Talk with your health care provider about which countries are considered high risk.  Your parents were born in a high-risk country and you have not received a shot to protect against hepatitis B (hepatitis B vaccine).  You have HIV or AIDS.  You use needles to inject street drugs.  You live with, or have sex with, someone who has hepatitis B.  You are a man who has sex with other men (MSM).  You get hemodialysis treatment.  You take certain medicines for conditions like cancer, organ transplantation, and autoimmune conditions.  Hepatitis C blood testing is recommended for all people born from 1945 through 1965 and any individual with known risk factors for hepatitis C.  Healthy men should no longer receive prostate-specific antigen (PSA) blood tests as part of routine cancer screening. Talk to your health care provider about prostate cancer screening.  Testicular cancer screening is not recommended for adolescents or adult males who have no symptoms. Screening includes self-exam, a health care provider exam, and other screening tests. Consult with your health care provider about any symptoms you have or any concerns you have about testicular cancer.  Practice safe sex. Use condoms and avoid high-risk sexual practices to reduce the spread of sexually transmitted infections (STIs).  You should be screened for STIs, including gonorrhea and chlamydia if:  You are sexually active and are younger than  24 years.  You are older than 24 years, and your health care provider tells you that you are at risk for this type of infection.  Your sexual activity has changed since you were last screened, and you are at an increased risk for chlamydia or gonorrhea. Ask your health care provider if you are at risk.  If you are at risk of being infected with HIV, it is recommended that you take a prescription medicine daily to prevent HIV infection. This is called pre-exposure prophylaxis (PrEP). You are considered at risk if:  You are a man who has sex with other men (MSM).  You are a heterosexual man who is sexually active with multiple partners.  You take drugs by injection.  You are sexually active with a partner who has HIV.  Talk with your health care provider about whether you are at high risk of being infected with HIV. If you choose to begin PrEP, you should first be tested for HIV. You should then be tested every 3 months for as long as you are taking PrEP.  Use sunscreen. Apply sunscreen liberally and repeatedly throughout the day. You should seek shade when your shadow is shorter than you. Protect yourself by wearing long sleeves, pants, a wide-brimmed hat, and sunglasses year round whenever you are outdoors.  Tell your health care provider of new moles or changes in moles, especially if there is a change in shape or color. Also, tell your health care provider if a mole is larger   than the size of a pencil eraser.  A one-time screening for abdominal aortic aneurysm (AAA) and surgical repair of large AAAs by ultrasound is recommended for men aged 65-75 years who are current or former smokers.  Stay current with your vaccines (immunizations). This information is not intended to replace advice given to you by your health care provider. Make sure you discuss any questions you have with your health care provider. Document Released: 04/06/2008 Document Revised: 10/30/2014 Document Reviewed:  07/13/2015 Elsevier Interactive Patient Education  2017 Elsevier Inc.  

## 2016-10-03 ENCOUNTER — Ambulatory Visit (INDEPENDENT_AMBULATORY_CARE_PROVIDER_SITE_OTHER): Payer: Medicare Other | Admitting: Cardiovascular Disease

## 2016-10-03 ENCOUNTER — Encounter: Payer: Self-pay | Admitting: Cardiovascular Disease

## 2016-10-03 VITALS — BP 100/64 | HR 74 | Ht 68.0 in | Wt 184.5 lb

## 2016-10-03 DIAGNOSIS — E7800 Pure hypercholesterolemia, unspecified: Secondary | ICD-10-CM

## 2016-10-03 DIAGNOSIS — I25709 Atherosclerosis of coronary artery bypass graft(s), unspecified, with unspecified angina pectoris: Secondary | ICD-10-CM

## 2016-10-03 DIAGNOSIS — I10 Essential (primary) hypertension: Secondary | ICD-10-CM

## 2016-10-03 DIAGNOSIS — E1149 Type 2 diabetes mellitus with other diabetic neurological complication: Secondary | ICD-10-CM | POA: Diagnosis not present

## 2016-10-03 MED ORDER — NIACIN ER (ANTIHYPERLIPIDEMIC) 1000 MG PO TBCR: 1000.0000 mg | EXTENDED_RELEASE_TABLET | Freq: Every day | ORAL | 3 refills | Status: DC

## 2016-10-03 MED ORDER — CLOPIDOGREL BISULFATE 75 MG PO TABS: 75.0000 mg | ORAL_TABLET | Freq: Every day | ORAL | 3 refills | Status: DC

## 2016-10-03 MED ORDER — CARVEDILOL 6.25 MG PO TABS: 6.2500 mg | ORAL_TABLET | Freq: Two times a day (BID) | ORAL | 3 refills | Status: DC

## 2016-10-03 NOTE — Progress Notes (Signed)
Cardiology Office Note  Date:  10/03/2016   ID:  Andrew [name], DOB January 09, 1934, Andrew [name]  PCP:  Andrew SanesStacy J Burns, Andrew [name]   Chief Complaint  Patient presents with  . other    6 month f/u no complaints today. Meds reviewed verbally with pt.    HPI:  Andrew [name] is a very pleasant 82 year old gentleman with history of coronary artery disease, bypass in May 2010, hyperlipidemia, diabetes, hypertension, who presents for follow-up of his coronary artery disease Check hip and back pain, can't walk  In follow-up today, he reports that he is doing well Active at baseline cuts wood In the summer works in his garden  Lab work reviewed  hemoglobin A1c 6.6, total cholesterol 152 Weight has been stable Trace ankle edema on the left lower extremity  Blood pressure previously low, he cut his carvedilol down to 3.125 mg daily  Blood pressure still low but he is asymptomatic Previously had excessive bruising, he cut his Plavix in half daily with aspirin   EKG on today's visit shows normal sinus rhythm with rate 74 bpm, right bundle branch block  Other past medical history  History of a pinched nerve, back surgery 06/19/2013. He continues to have back pain, status post [facility]. Biggest complaint is arthritis in his hips, back, knees. He reports that he has tried Cialis and Viagra in the past for erectile dysfunction. This did not work for him  Previous total cholesterol 141, LDL 67   PMH:   has a past medical history of Colonic polyp (2011 last colo); Coronary artery disease (02/2009); DEGENERATIVE JOINT DISEASE; Diverticulosis of colon; DIVERTICULOSIS, COLON; HYPERLIPIDEMIA-MIXED; HYPERTENSION, BENIGN; LBP (low back pain); Type II or unspecified type diabetes mellitus without mention of complication, not stated as uncontrolled; and VITAMIN D DEFICIENCY.  PSH:    Past Surgical History:  Procedure Laterality Date  . CARPAL TUNNEL  RELEASE Right   . CATARACT EXTRACTION, BILATERAL     R 07/07/13, L 07/21/13  . CORONARY ARTERY BYPASS GRAFT  03/17/09   x 4  . INGUINAL HERNIA REPAIR  1980  . KNEE ARTHROSCOPY  2007   right  . LUMBAR EPIDURAL INJECTION  2013   Has as needed.  . LUMBAR LAMINECTOMY/DECOMPRESSION MICRODISCECTOMY N/A 06/19/2013   Procedure: LUMBAR LAMINECTOMY/DECOMPRESSION MICRODISCECTOMY;  Surgeon: Emilee HeroMark Leonard Dumonski, Andrew [name];  [facility];  Service: Orthopedics;  Laterality: N/A;  Lumbar 4-5 decompression  . TRIGGER FINGER RELEASE Right     Current Outpatient Prescriptions  Medication Sig Dispense Refill  . aspirin EC 81 MG tablet Take 81 mg by mouth daily.    Marland Kitchen. atorvastatin (LIPITOR) 20 MG tablet TAKE 1 TABLET DAILY AT 6 P.M. 90 tablet 1  . carvedilol (COREG) 6.25 MG tablet Take 1 tablet (6.25 mg total) by mouth 2 (two) times daily with a meal. 180 tablet 3  . celecoxib (CELEBREX) 200 MG capsule Take 200 mg by mouth 2 (two) times daily.    . cholecalciferol (VITAMIN D) 1000 UNITS tablet Take 1,000 Units by mouth daily.    . clopidogrel (PLAVIX) 75 MG tablet Take 1 tablet (75 mg total) by mouth daily. 90 tablet 3  . HYDROcodone-acetaminophen (NORCO/VICODIN) 5-325 MG per tablet Take 1 tablet by mouth every 6 (six) hours as needed.     . metFORMIN (GLUCOPHAGE-XR) 500 MG 24 hr tablet TAKE 1 TABLET TWICE A DAY 180 tablet 2  . Multiple Vitamin (MULTIVITAMIN WITH MINERALS) TABS tablet Take 1 tablet by mouth daily.    .Marland Kitchen  niacin (NIASPAN) 1000 MG CR tablet Take 1 tablet (1,000 mg total) by mouth at bedtime. 90 tablet 3   No current facility-administered medications for this visit.     Allergies:   Penicillins   Social History:  The patient  reports that he has never smoked. He has never used smokeless tobacco. He reports that he drinks alcohol. He reports that he does not use drugs.   Family History:   family history includes Diabetes in his other; Heart failure in his father and mother.    Review of  Systems: Review of Systems  Constitutional: Negative.   Respiratory: Negative.   Cardiovascular: Negative.   Gastrointestinal: Negative.   Musculoskeletal: Positive for back pain and joint pain.  Neurological: Negative.   Psychiatric/Behavioral: Negative.   All other systems reviewed and are negative.    PHYSICAL EXAM: VS:  BP 100/64 (BP Location: Left Arm, Patient Position: Sitting, Cuff Size: Normal)   Pulse 74   Ht 5\' 8"  (1.727 m)   Wt 184 lb 8 oz (83.7 kg)   BMI 28.05 kg/m  , BMI Body mass index is 28.05 kg/m. GEN: Well nourished, well developed, in no acute distress  HEENT: normal  Neck: no JVD, carotid bruits, or masses Cardiac: RRR; 1+ SEM LSB,  No rubs, or gallops,no edema  Respiratory:  clear to auscultation bilaterally, normal work of breathing GI: soft, nontender, nondistended, + BS MS: no deformity or atrophy  Skin: warm and dry, no rash Neuro:  Strength and sensation are intact Psych: euthymic mood, full affect    Recent Labs: 09/25/2016: ALT 17; BUN 29; Creatinine, Ser 1.26; Potassium 4.3; Sodium 140    Lipid Panel Lab Results  Component Value Date   CHOL 152 03/22/2016   HDL 48.20 03/22/2016   LDLCALC 75 03/22/2016   TRIG 141.0 03/22/2016      Wt Readings from Last 3 Encounters:  10/03/16 184 lb 8 oz (83.7 kg)  09/26/16 184 lb 0.6 oz (83.5 kg)  09/25/16 185 lb (83.9 kg)       ASSESSMENT AND PLAN:  Pure hypercholesterolemia - Plan: EKG 12-Lead Cholesterol is at goal on the current lipid regimen. No changes to the medications were made.  HYPERTENSION, BENIGN - Plan: EKG 12-Lead Blood pressure is well controlled on today's visit. No changes made to the medications.  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 No further workup at this time. Continue current medication regimen.  Type 2 diabetes mellitus with neurological manifestations,  controlled (HCC) - Plan: EKG 12-Lead We have encouraged continued exercise, careful diet management in an effort to lose weight.    Total encounter time more than 15 minutes  Greater than 50% was spent in counseling and coordination of care with the patient   Disposition:   F/U  6 months   Orders Placed This Encounter  Procedures  . EKG 12-Lead     Signed, Dossie Arbourim Jarel Cuadra, M.D., Ph.D. 10/03/2016  [facility], [place] [phone]

## 2016-10-03 NOTE — Patient Instructions (Signed)

## 2016-10-16 ENCOUNTER — Other Ambulatory Visit: Payer: Self-pay | Admitting: Internal Medicine

## 2016-12-18 ENCOUNTER — Telehealth: Payer: Self-pay | Admitting: Cardiovascular Disease

## 2016-12-18 NOTE — Telephone Encounter (Signed)
Pt states he has been nauseated and lightheaded for 2 weeks. He states he feels this way after he takes his morning medications. He states after he eats later in the day he does not feel this anymore. Denies any CP, SOB, dizziness. States his "ambition level is 0". States he had a lightheaded spell 2 weeks ago, when he was walking to the mailbox and went down on his hands and knees.

## 2016-12-18 NOTE — Telephone Encounter (Signed)
S/w patient. He stated his nausea comes on most days shortly after eating his breakfast. He says he always takes his morning medications upstairs prior to coming down for breakfast on an empty stomach. He typically eats breakfast about 30 minutes or so later. Lightheadedness occurs occasionally when changing positions from sitting to standing.  He says he just has "no energy." Patient denies SOB or CP. Had patient take his BP while on the phone and it was 155/83, HR 57.  Patient had me speak to wife while taking BP. She stated she feels he has been SOB with activities at times. She also said he is usually the "evergizer bunny, with different projects going on at the same time."  She says he has not been motivated or energetic enough to do as he usually does and it concerned.   Advised patient to try taking his medications with breakfast so that there is food on his stomach as some medications can cause stomach upset if not taken with food. Also advised to change positions slowly to help with lightheadedness. Patient and wife would like to see Dr Mariah MillingGollan because of SOB and lack of energy. Appt made for 12/21/16 with Dr Mariah MillingGollan. He verbalized understanding.

## 2016-12-21 ENCOUNTER — Encounter: Payer: Self-pay | Admitting: Cardiovascular Disease

## 2016-12-21 ENCOUNTER — Ambulatory Visit (INDEPENDENT_AMBULATORY_CARE_PROVIDER_SITE_OTHER): Payer: Medicare Other

## 2016-12-21 ENCOUNTER — Ambulatory Visit (INDEPENDENT_AMBULATORY_CARE_PROVIDER_SITE_OTHER): Payer: Medicare Other | Admitting: Cardiovascular Disease

## 2016-12-21 VITALS — BP 112/76 | HR 65 | Ht 68.0 in | Wt 187.5 lb

## 2016-12-21 DIAGNOSIS — E7800 Pure hypercholesterolemia, unspecified: Secondary | ICD-10-CM

## 2016-12-21 DIAGNOSIS — R5383 Other fatigue: Secondary | ICD-10-CM

## 2016-12-21 DIAGNOSIS — I951 Orthostatic hypotension: Secondary | ICD-10-CM

## 2016-12-21 DIAGNOSIS — I25709 Atherosclerosis of coronary artery bypass graft(s), unspecified, with unspecified angina pectoris: Secondary | ICD-10-CM | POA: Diagnosis not present

## 2016-12-21 DIAGNOSIS — E1149 Type 2 diabetes mellitus with other diabetic neurological complication: Secondary | ICD-10-CM | POA: Diagnosis not present

## 2016-12-21 DIAGNOSIS — R55 Syncope and collapse: Secondary | ICD-10-CM

## 2016-12-21 DIAGNOSIS — I10 Essential (primary) hypertension: Secondary | ICD-10-CM

## 2016-12-21 NOTE — Progress Notes (Signed)
Cardiology Office Note  Date:  12/21/2016   ID:  Andrew [name], DOB 1934/09/13, MRN [id number]  PCP:  Pincus Sanes, MD   Chief Complaint  Patient presents with  . other      Early f/u due to lightheadedness, SOB with exertion, lack of energy. Reviewed meds with pt verbally.    HPI:  Andrew [name] is a very pleasant 83 year old gentleman with history of coronary artery disease, bypass in May 2010, hyperlipidemia, diabetes, hypertension, who presents for follow-up of his coronary artery disease Chronic hip and back pain, can't walk hx of near syncope  In follow-up today he reports that he has been having Lightheaded in AM, For the past three weeks Indigestion, lump in his chest in the Am Tried changing the timing of his morning medication, perhaps mild improvement of his near syncope Still with lump in his chest Otherwise active, denies any significant chest pain later in the day on exertion Wife has noticed significant fatigue  Gets more tired with 15 or 20 minutes of activity, previously was able to go nonstop  Drinks plenty  Over the summer is Active at baseline cuts wood, works in his garden  Lab work reviewed hemoglobin A1c 6.6, total cholesterol 152 BUN elevated, normal creatinine  Weight has been stable Trace ankle edema on the left lower extremity  Blood pressure has been running high recently, 140 to 150 systolic he increased his carvedilol up to 3.125 mg twice a day, previously was taking this daily  EKG on today's visit shows normal sinus rhythm with rate 65 bpm, right bundle branch block No significant change compared to previous EKGs  Other past medical history  History of a pinched nerve, back surgery 06/19/2013. He continues to have back pain, status post radiofrequency treatments x3 through [facility]. Biggest complaint is arthritis in his hips, back, knees. He reports that he has tried Cialis and Viagra in the past for erectile  dysfunction. This did not work for him  Previous total cholesterol 141, LDL 67   PMH:   has a past medical history of Colonic polyp (2011 last colo); Coronary artery disease (02/2009); DEGENERATIVE JOINT DISEASE; Diverticulosis of colon; DIVERTICULOSIS, COLON; HYPERLIPIDEMIA-MIXED; HYPERTENSION, BENIGN; LBP (low back pain); Type II or unspecified type diabetes mellitus without mention of complication, not stated as uncontrolled; and VITAMIN D DEFICIENCY.  PSH:    Past Surgical History:  Procedure Laterality Date  . CARPAL TUNNEL RELEASE Right   . CATARACT EXTRACTION, BILATERAL     R 07/07/13, L 07/21/13  . CORONARY ARTERY BYPASS GRAFT  03/17/09   x 4  . INGUINAL HERNIA REPAIR  1980  . KNEE ARTHROSCOPY  2007   right  . LUMBAR EPIDURAL INJECTION  2013   Has as needed.  . LUMBAR LAMINECTOMY/DECOMPRESSION MICRODISCECTOMY N/A 06/19/2013   Procedure: LUMBAR LAMINECTOMY/DECOMPRESSION MICRODISCECTOMY;  Surgeon: Emilee Hero, MD;  Location: [facility] OR;  Service: Orthopedics;  Laterality: N/A;  Lumbar 4-5 decompression  . TRIGGER FINGER RELEASE Right     Current Outpatient Prescriptions  Medication Sig Dispense Refill  . aspirin EC 81 MG tablet Take 81 mg by mouth daily.    Marland Kitchen atorvastatin (LIPITOR) 20 MG tablet TAKE 1 TABLET DAILY AT 6 P.M. 90 tablet 1  . carvedilol (COREG) 6.25 MG tablet Take 1 tablet (6.25 mg total) by mouth 2 (two) times daily with a meal. 180 tablet 3  . celecoxib (CELEBREX) 200 MG capsule Take 200 mg by mouth 2 (two) times daily.    Marland Kitchen  cholecalciferol (VITAMIN D) 1000 UNITS tablet Take 1,000 Units by mouth daily.    . clopidogrel (PLAVIX) 75 MG tablet Take 1 tablet (75 mg total) by mouth daily. 90 tablet 3  . HYDROcodone-acetaminophen (NORCO/VICODIN) 5-325 MG per tablet Take 1 tablet by mouth every 6 (six) hours as needed.     . metFORMIN (GLUCOPHAGE-XR) 500 MG 24 hr tablet TAKE 1 TABLET TWICE A DAY 180 tablet 2  . Multiple Vitamin (MULTIVITAMIN WITH MINERALS) TABS  tablet Take 1 tablet by mouth daily.    . niacin (NIASPAN) 1000 MG CR tablet Take 1 tablet (1,000 mg total) by mouth at bedtime. 90 tablet 3   No current facility-administered medications for this visit.      Allergies:   Penicillins   Social History:  The patient  reports that he has never smoked. He has never used smokeless tobacco. He reports that he drinks alcohol. He reports that he does not use drugs.   Family History:   family history includes Diabetes in his other; Heart failure in his father and mother.    Review of Systems: Review of Systems  Constitutional: Positive for malaise/fatigue.  Respiratory: Positive for shortness of breath.   Cardiovascular: Positive for chest pain.  Gastrointestinal: Negative.   Musculoskeletal: Negative.   Neurological: Positive for dizziness and weakness.  Psychiatric/Behavioral: Negative.   All other systems reviewed and are negative.    PHYSICAL EXAM: VS:  BP 112/76 (BP Location: Left Arm, Patient Position: Sitting, Cuff Size: Normal)   Pulse 65   Ht 5\' 8"  (1.727 m)   Wt 187 lb 8 oz (85 kg)   BMI 28.51 kg/m  , BMI Body mass index is 28.51 kg/m. GEN: Well nourished, well developed, in no acute distress  HEENT: normal  Neck: no JVD, carotid bruits, or masses Cardiac: RRR; no murmurs, rubs, or gallops,no edema  Respiratory:  clear to auscultation bilaterally, normal work of breathing GI: soft, nontender, nondistended, + BS MS: no deformity or atrophy  Skin: warm and dry, no rash Neuro:  Strength and sensation are intact Psych: euthymic mood, full affect    Recent Labs: 09/25/2016: ALT 17; BUN 29; Creatinine, Ser 1.26; Potassium 4.3; Sodium 140    Lipid Panel Lab Results  Component Value Date   CHOL 152 03/22/2016   HDL 48.20 03/22/2016   LDLCALC 75 03/22/2016   TRIG 141.0 03/22/2016      Wt Readings from Last 3 Encounters:  12/21/16 187 lb 8 oz (85 kg)  10/03/16 184 lb 8 oz (83.7 kg)  09/26/16 184 lb 0.6 oz (83.5  kg)       ASSESSMENT AND PLAN:  Pure hypercholesterolemia -  Cholesterol is at goal on the current lipid regimen. No changes to the medications were made.   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 Unable to exclude ischemia. He is unable to treadmill given chronic back pain arthritic issues. Pharmacologic Myoview has been ordered  Orthostatic hypotension - No significant change in blood pressure on today's visit, orthostatics were checked, systolic pressure 132 even with standing  Type 2 diabetes mellitus with neurological manifestations, controlled (HCC) We have encouraged continued exercise, careful diet management in an effort to lose weight.  Near  syncope Etiology unclear, unable to exclude arrhythmia given acute onset of symptoms. We have ordered event monitor for palpitations, near syncope  Fatigue, unspecified type Testing as above with event monitor and stress testing to rule out arrhythmia, ischemia   Total encounter time more than 45 minutes  Greater than 50% was spent in counseling and coordination of care with the patient  Disposition:   F/U  1 month we will call him with the results    Orders Placed This Encounter  Procedures  . NM Myocar Multi W/Spect W/Wall Motion / EF  . LONG TERM MONITOR (3-14 DAYS)  . EKG 12-Lead     Signed, Dossie Arbour, M.D., Ph.D. 12/21/2016  [facility] [place], [place] [phone]

## 2016-12-21 NOTE — Patient Instructions (Addendum)
Medication Instructions:   No medication changes made  Labwork:  No new labs needed  Testing/Procedures:  We will put a zio patch for near-syncope, palpitations, fatigue  We will order a lexiscan stress test for shortness of breath, near syncope  [facility] MYOVIEW  Your caregiver has ordered a Stress Test with nuclear imaging. The purpose of this test is to evaluate the blood supply to your heart muscle. This procedure is referred to as a "Non-Invasive Stress Test." This is because other than having an IV started in your vein, nothing is inserted or "invades" your body. Cardiac stress tests are done to find areas of poor blood flow to the heart by determining the extent of coronary artery disease (CAD). Some patients exercise on a treadmill, which naturally increases the blood flow to your heart, while others who are  unable to walk on a treadmill due to physical limitations have a pharmacologic/chemical stress agent called Lexiscan . This medicine will mimic walking on a treadmill by temporarily increasing your coronary blood flow.   Please note: these test may take anywhere between 2-4 hours to complete  PLEASE REPORT TO [facility] MEDICAL MALL ENTRANCE  THE VOLUNTEERS AT THE FIRST DESK WILL DIRECT YOU WHERE TO GO  Date of Procedure:_____Wednesday, March 7_____  Arrival Time for Procedure:____9:45 am________  Instructions regarding medication:   __X__ : Hold diabetes medication morning of procedure  __X__:  Hold CARVEDILOL the night before and morning of procedure  How to prepare for your Myoview test:  1. Do not eat or drink after midnight 2. No caffeine for 24 hours prior to test 3. No smoking 24 hours prior to test. 4. Your medication may be taken with water.  If your doctor stopped a medication because of this test, do not take that medication. 5. Please wear a short sleeve shirt. 6. No perfume, cologne or lotion.  I recommend watching educational videos on topics of interest to  you at:       [email or web address]  Enter code: HEARTCARE    Follow-Up: It was a pleasure seeing you in the office today. Please call us if you have new issues that need to be addressed before your next appt.  [phone]  Your physician wants you to follow-up in: 1 month.  You will receive a reminder letter in the mail two months in advance. If you don't receive a letter, please call our office to schedule the follow-up appointment.  If you need a refill on your cardiac medications before your next appointment, please call your pharmacy.    Pharmacologic Stress Electrocardiogram Introduction A pharmacologic stress electrocardiogram is a heart (cardiac) test that uses nuclear imaging to evaluate the blood supply to your heart. This test may also be called a pharmacologic stress electrocardiography. Pharmacologic means that a medicine is used to increase your heart rate and blood pressure. This stress test is done to find areas of poor blood flow to the heart by determining the extent of coronary artery disease (CAD). Some people exercise on a treadmill, which naturally increases the blood flow to the heart. For those people unable to exercise on a treadmill, a medicine is used. This medicine stimulates your heart and will cause your heart to beat harder and more quickly, as if you were exercising. Pharmacologic stress tests can help determine:  The adequacy of blood flow to your heart during increased levels of activity in order to clear you for discharge home.  The extent of coronary artery blockage caused by CAD.  Your prognosis if you have suffered a heart attack.  The effectiveness of cardiac procedures done, such as an angioplasty, which can increase the circulation in your coronary arteries.  Causes of chest pain or pressure. LET [facility] CARE PROVIDER KNOW ABOUT:  Any allergies you have.  All medicines you are taking, including vitamins, herbs, eye drops, creams, and  over-the-counter medicines.  Previous problems you or members of your family have had with the use of anesthetics.  Any blood disorders you have.  Previous surgeries you have had.  Medical conditions you have.  Possibility of pregnancy, if this applies.  If you are currently breastfeeding. RISKS AND COMPLICATIONS Generally, this is a safe procedure. However, as with any procedure, complications can occur. Possible complications include:  You develop pain or pressure in the following areas:  Chest.  Jaw or neck.  Between your shoulder blades.  Radiating down your left arm.  Headache.  Dizziness or light-headedness.  Shortness of breath.  Increased or irregular heartbeat.  Low blood pressure.  Nausea or vomiting.  Flushing.  Redness going up the arm and slight pain during injection of medicine.  Heart attack (rare). BEFORE THE PROCEDURE  Avoid all forms of caffeine for 24 hours before your test or as directed by your health care provider. This includes coffee, tea (even decaffeinated tea), caffeinated sodas, chocolate, cocoa, and certain pain medicines.  Follow your health care provider's instructions regarding eating and drinking before the test.  Take your medicines as directed at regular times with water unless instructed otherwise. Exceptions may include:  If you have diabetes, ask how you are to take your insulin or pills. It is common to adjust insulin dosing the morning of the test.  If you are taking beta-blocker medicines, it is important to talk to your health care provider about these medicines well before the date of your test. Taking beta-blocker medicines may interfere with the test. In some cases, these medicines need to be changed or stopped 24 hours or more before the test.  If you wear a nitroglycerin patch, it may need to be removed prior to the test. Ask your health care provider if the patch should be removed before the test.  If you use an  inhaler for any breathing condition, bring it with you to the test.  If you are an outpatient, bring a snack so you can eat right after the stress phase of the test.  Do not smoke for 4 hours prior to the test or as directed by your health care provider.  Do not apply lotions, powders, creams, or oils on your chest prior to the test.  Wear comfortable shoes and clothing. Let your health care provider know if you were unable to complete or follow the preparations for your test. PROCEDURE  Multiple patches (electrodes) will be put on your chest. If needed, small areas of your chest may be shaved to get better contact with the electrodes. Once the electrodes are attached to your body, multiple wires will be attached to the electrodes, and your heart rate will be monitored.  An IV access will be started. A nuclear trace (isotope) is given. The isotope may be given intravenously, or it may be swallowed. Nuclear refers to several types of radioactive isotopes, and the nuclear isotope lights up the arteries so that the nuclear images are clear. The isotope is absorbed by your body. This results in low radiation exposure.  A resting nuclear image is taken to show how your  heart functions at rest.  A medicine is given through the IV access.  A second scan is done about 1 hour after the medicine injection and determines how your heart functions under stress.  During this stress phase, you will be connected to an electrocardiogram machine. Your blood pressure and oxygen levels will be monitored. What to expect after the procedure  Your heart rate and blood pressure will be monitored after the test.  You may return to your normal schedule, including diet,activities, and medicines, unless your health care provider tells you otherwise. This information is not intended to replace advice given to you by your health care provider. Make sure you discuss any questions you have with your health care  provider. Document Released: 02/25/2009 Document Revised: 03/16/2016 Document Reviewed: 04/17/2016 Elsevier Interactive Patient Education  2017 [employer].

## 2016-12-27 ENCOUNTER — Encounter
Admission: RE | Admit: 2016-12-27 | Discharge: 2016-12-27 | Disposition: A | Discharge status: Home / Self Care | Payer: Medicare Other | Source: Ambulatory Visit | Attending: Cardiovascular Disease | Admitting: Cardiovascular Disease

## 2016-12-27 DIAGNOSIS — I25709 Atherosclerosis of coronary artery bypass graft(s), unspecified, with unspecified angina pectoris: Secondary | ICD-10-CM | POA: Diagnosis not present

## 2016-12-27 DIAGNOSIS — I951 Orthostatic hypotension: Secondary | ICD-10-CM | POA: Insufficient documentation

## 2016-12-27 DIAGNOSIS — E7800 Pure hypercholesterolemia, unspecified: Secondary | ICD-10-CM | POA: Diagnosis not present

## 2016-12-27 DIAGNOSIS — I10 Essential (primary) hypertension: Secondary | ICD-10-CM | POA: Insufficient documentation

## 2016-12-27 DIAGNOSIS — R55 Syncope and collapse: Secondary | ICD-10-CM | POA: Diagnosis present

## 2016-12-27 DIAGNOSIS — R0602 Shortness of breath: Secondary | ICD-10-CM | POA: Insufficient documentation

## 2016-12-27 LAB — NM MYOCAR MULTI W/SPECT W/WALL MOTION / EF
CHL CUP NUCLEAR SDS: 4
CHL CUP NUCLEAR SSS: 5
CHL CUP RESTING HR STRESS: 67 {beats}/min
CHL CUP STRESS STAGE 1 GRADE: 0 %
CHL CUP STRESS STAGE 1 SPEED: 0 mph
CHL CUP STRESS STAGE 4 GRADE: 0 %
CHL CUP STRESS STAGE 4 HR: 75 {beats}/min
CHL CUP STRESS STAGE 4 SPEED: 0 mph
CHL CUP STRESS STAGE 5 HR: 77 {beats}/min
CSEPEW: 1 METS
CSEPHR: 60 %
CSEPPHR: 78 {beats}/min
CSEPPMHR: 55 %
LV dias vol: 78 mL (ref 62–150)
LVSYSVOL: 25 mL
SRS: 1
Stage 1 HR: 64 {beats}/min
Stage 2 Grade: 0 %
Stage 2 HR: 64 {beats}/min
Stage 2 Speed: 0 mph
Stage 3 Grade: 0 %
Stage 3 HR: 78 {beats}/min
Stage 3 Speed: 0 mph
Stage 5 DBP: 80 mmHg
Stage 5 Grade: 0 %
Stage 5 SBP: 131 mmHg
Stage 5 Speed: 0 mph
TID: 0.9

## 2016-12-27 MED ORDER — REGADENOSON 0.4 MG/5ML IV SOLN
0.4000 mg | Freq: Once | INTRAVENOUS | Status: AC
  Administered 2016-12-27: 0.4 mg via INTRAVENOUS
  Filled 2016-12-27: qty 5

## 2016-12-27 MED ORDER — TECHNETIUM TC 99M TETROFOSMIN IV KIT
30.0000 | PACK | Freq: Once | INTRAVENOUS | Status: AC | PRN
  Administered 2016-12-27: 32.01 via INTRAVENOUS

## 2016-12-27 MED ORDER — TECHNETIUM TC 99M TETROFOSMIN IV KIT
13.0000 | PACK | Freq: Once | INTRAVENOUS | Status: AC | PRN
  Administered 2016-12-27: 13.3 via INTRAVENOUS

## 2017-01-03 DIAGNOSIS — I25709 Atherosclerosis of coronary artery bypass graft(s), unspecified, with unspecified angina pectoris: Secondary | ICD-10-CM

## 2017-01-03 DIAGNOSIS — E7800 Pure hypercholesterolemia, unspecified: Secondary | ICD-10-CM | POA: Diagnosis not present

## 2017-01-03 DIAGNOSIS — I951 Orthostatic hypotension: Secondary | ICD-10-CM

## 2017-01-03 DIAGNOSIS — I10 Essential (primary) hypertension: Secondary | ICD-10-CM | POA: Diagnosis not present

## 2017-01-10 ENCOUNTER — Other Ambulatory Visit: Payer: Self-pay | Admitting: Internal Medicine

## 2017-01-20 DIAGNOSIS — R5382 Chronic fatigue, unspecified: Secondary | ICD-10-CM | POA: Insufficient documentation

## 2017-01-20 NOTE — Progress Notes (Signed)
Cardiology Office Note  Date:  01/23/2017   ID:  Andrew [name], DOB [birth year], MRN [id number]  PCP:  Pincus Sanes, MD   Chief Complaint  Patient presents with  . OTHER    F/u zio and stress echo c/o body aches. Meds reviewed verbally with pt.    HPI:  Andrew [name] is a very pleasant 83 year old gentleman with history of  coronary artery disease,  bypass in May 2010, Chronic hip and back pain, can't walk hx of near syncope  hyperlipidemia,  diabetes,  hypertension,  who presents for follow-up of his coronary artery disease  On prior office visit, he was Lightheaded in AM,  Walking to the mailbox Etiology unclear, we did stress test and event monitor  He also reported having Indigestion, lump in his chest in the Am  Tried changing the timing of his morning medication, perhaps mild improvement of his near syncope Wife has noticed significant fatigue   Stress test: 12/27/2016: no ischemia Results reviewed with him in detail  Event monitor 12/21/2016:  Results reviewed with him in detail Normal sinus rhythm Min HR of 43 bpm, max HR of 117 bpm, and avg HR of 72 bpm.  Rare APCs and PVCs likely causing symptoms No other significnat arrhythmia  Over the summer is Active at baseline cuts wood, works in his garden He has started to get his garden ready for the spring and summer  Lab work reviewed hemoglobin A1c 6.6, total cholesterol 152 BUN elevated, normal creatinine  Now taking carvedilol twice a day  Other past medical history  History of a pinched nerve, back surgery 06/19/2013. He continues to have back pain, status post radiofrequency treatments x3 through [facility]. Biggest complaint is arthritis in his hips, back, knees. He reports that he has tried Cialis and Viagra in the past for erectile dysfunction. This did not work for him  Previous total cholesterol 141, LDL 67   PMH:   has a past medical history of Colonic polyp (2011 last colo);  Coronary artery disease (02/2009); DEGENERATIVE JOINT DISEASE; Diverticulosis of colon; DIVERTICULOSIS, COLON; HYPERLIPIDEMIA-MIXED; HYPERTENSION, BENIGN; LBP (low back pain); Type II or unspecified type diabetes mellitus without mention of complication, not stated as uncontrolled; and VITAMIN D DEFICIENCY.  PSH:    Past Surgical History:  Procedure Laterality Date  . CARPAL TUNNEL RELEASE Right   . CATARACT EXTRACTION, BILATERAL     R 07/07/13, L 07/21/13  . CORONARY ARTERY BYPASS GRAFT  03/17/09   x 4  . INGUINAL HERNIA REPAIR  1980  . KNEE ARTHROSCOPY  2007   right  . LUMBAR EPIDURAL INJECTION  2013   Has as needed.  . LUMBAR LAMINECTOMY/DECOMPRESSION MICRODISCECTOMY N/A 06/19/2013   Procedure: LUMBAR LAMINECTOMY/DECOMPRESSION MICRODISCECTOMY;  Surgeon: Emilee Hero, MD;  Location: [facility] OR;  Service: Orthopedics;  Laterality: N/A;  Lumbar 4-5 decompression  . TRIGGER FINGER RELEASE Right     Current Outpatient Prescriptions  Medication Sig Dispense Refill  . aspirin EC 81 MG tablet Take 81 mg by mouth daily.    Marland Kitchen atorvastatin (LIPITOR) 20 MG tablet TAKE 1 TABLET DAILY AT 6 P.M. 90 tablet 1  . carvedilol (COREG) 6.25 MG tablet Take 1 tablet (6.25 mg total) by mouth 2 (two) times daily with a meal. 180 tablet 3  . celecoxib (CELEBREX) 200 MG capsule Take 200 mg by mouth 2 (two) times daily.    . cholecalciferol (VITAMIN D) 1000 UNITS tablet Take 1,000 Units by mouth daily.    Marland Kitchen  clopidogrel (PLAVIX) 75 MG tablet Take 1 tablet (75 mg total) by mouth daily. 90 tablet 3  . HYDROcodone-acetaminophen (NORCO/VICODIN) 5-325 MG per tablet Take 1 tablet by mouth every 6 (six) hours as needed.     . metFORMIN (GLUCOPHAGE-XR) 500 MG 24 hr tablet TAKE 1 TABLET TWICE A DAY 180 tablet 2  . Multiple Vitamin (MULTIVITAMIN WITH MINERALS) TABS tablet Take 1 tablet by mouth daily.    . niacin (NIASPAN) 1000 MG CR tablet Take 1 tablet (1,000 mg total) by mouth at bedtime. 90 tablet 3   No current  facility-administered medications for this visit.      Allergies:   Penicillins   Social History:  The patient  reports that he has never smoked. He has never used smokeless tobacco. He reports that he drinks alcohol. He reports that he does not use drugs.   Family History:   family history includes Diabetes in his other; Heart failure in his father and mother.    Review of Systems: Review of Systems  Constitutional: Positive for malaise/fatigue.  Respiratory: Negative.   Cardiovascular: Negative.   Gastrointestinal: Negative.   Musculoskeletal: Negative.   Neurological: Negative.   Psychiatric/Behavioral: Negative.   All other systems reviewed and are negative.    PHYSICAL EXAM: VS:  BP 118/72 (BP Location: Left Arm, Patient Position: Sitting, Cuff Size: Normal)   Pulse 71   Ht  (1.727 m)   Wt 188 lb (85.3 kg)   BMI 28.59 kg/m  , BMI Body mass index is 28.59 kg/m. GEN: Well nourished, well developed, in no acute distress  HEENT: normal  Neck: no JVD, carotid bruits, or masses Cardiac: RRR; no murmurs, rubs, or gallops,no edema  Respiratory:  clear to auscultation bilaterally, normal work of breathing GI: soft, nontender, nondistended, + BS MS: no deformity or atrophy  Skin: warm and dry, no rash Neuro:  Strength and sensation are intact Psych: euthymic mood, full affect    Recent Labs: 09/25/2016: ALT 17; BUN 29; Creatinine, Ser 1.26; Potassium 4.3; Sodium 140    Lipid Panel Lab Results  Component Value Date   CHOL 152 03/22/2016   HDL 48.20 03/22/2016   LDLCALC 75 03/22/2016   TRIG 141.0 03/22/2016      Wt Readings from Last 3 Encounters:  01/23/17 188 lb (85.3 kg)  12/21/16 187 lb 8 oz (85 kg)  10/03/16 184 lb 8 oz (83.7 kg)       ASSESSMENT AND PLAN:  Pure hypercholesterolemia -  Cholesterol is at goal on the current lipid regimen. No changes to the medications were made.   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  Near syncope Suspect secondary to orthostasis, no recent episodes  Fatigue, unspecified type Stress test and event monitor reviewed with him, no arrhythmia, no ischemia   Total encounter time more than 25 minutes  Greater than 50% was spent in counseling and coordination of care with the patient  Follow-up 6 months No orders of the defined types were  placed in this encounter.    Signed, Dossie Arbour, M.D., Ph.D. 01/23/2017  [facility] [place], [place] [phone]

## 2017-01-23 ENCOUNTER — Ambulatory Visit (INDEPENDENT_AMBULATORY_CARE_PROVIDER_SITE_OTHER): Payer: Medicare Other | Admitting: Cardiovascular Disease

## 2017-01-23 ENCOUNTER — Encounter: Payer: Self-pay | Admitting: Cardiovascular Disease

## 2017-01-23 VITALS — BP 118/72 | HR 71 | Ht 68.0 in | Wt 188.0 lb

## 2017-01-23 DIAGNOSIS — E1149 Type 2 diabetes mellitus with other diabetic neurological complication: Secondary | ICD-10-CM

## 2017-01-23 DIAGNOSIS — I10 Essential (primary) hypertension: Secondary | ICD-10-CM

## 2017-01-23 DIAGNOSIS — E782 Mixed hyperlipidemia: Secondary | ICD-10-CM | POA: Diagnosis not present

## 2017-01-23 DIAGNOSIS — I25708 Atherosclerosis of coronary artery bypass graft(s), unspecified, with other forms of angina pectoris: Secondary | ICD-10-CM | POA: Diagnosis not present

## 2017-01-23 DIAGNOSIS — R5382 Chronic fatigue, unspecified: Secondary | ICD-10-CM | POA: Diagnosis not present

## 2017-01-23 NOTE — Patient Instructions (Signed)

## 2017-02-28 ENCOUNTER — Telehealth: Payer: Self-pay | Admitting: Cardiovascular Disease

## 2017-02-28 NOTE — Telephone Encounter (Signed)
Received cardiac clearance request for pt to proceed w/ lumbar facet injection, b/l L5-S1. DOS has not been scheduled yet pending this clearance.  Please route clearance and instructions on holding pt's Plavix to [facility] @ [phone].

## 2017-03-01 NOTE — Telephone Encounter (Signed)
Acceptable risk for back injection No further testing needed Okay to stop Plavix for 5 to 7 days if needed Would stay on low-dose aspirin

## 2017-03-01 NOTE — Telephone Encounter (Signed)
Cardiac clearance routed to number provided.  

## 2017-03-24 NOTE — Patient Instructions (Addendum)

## 2017-03-24 NOTE — Progress Notes (Signed)
Subjective:    [name], male    DOB: 1934/08/31, 83 y.o.   MRN: [id number]  HPI The patient is here for follow up.  His feet feel cold.  They have been cold for a while.  He has some chronic lower back pain that radiates to the left hip.   Left ankle swelling:  It started a couple of months ago.  It is only the left ankle.  He denies injuries.   A few months ago he was going to the mailbox and felt lightheaded.  He leaned against a tree and went to his knees.  He was very lightheaded.  He did see cardiology and work up was negative.  He has not had any recurrences.    CAD, Hypertension: He is taking his medication daily. He is compliant with a low sodium diet.  He denies chest pain, palpitations, edema, shortness of breath and regular headaches. He is not exercising regularly.  He does monitor his blood pressure at home - typically around 120/70.    Diabetes: He is taking his medication daily as prescribed. He is compliant with a diabetic diet. He is not exercising regularly. He does not monitor his sugars.  He checks his feet daily and denies foot lesions. He is up-to-date with an ophthalmology examination.   Hyperlipidemia: He is taking his medication daily. He is compliant with a low fat/cholesterol diet. He is not exercising regularly.    Medications and allergies reviewed with patient and updated if appropriate.  Patient Active Problem List   Diagnosis Date Noted  . Chronic fatigue 01/20/2017  . Near syncope 12/21/2016  . Wears hearing aid 09/25/2016  . Degeneration of lumbar or lumbosacral intervertebral disc 03/23/2015  . Orthostatic hypotension 03/27/2013  . Generalized muscle ache 05/28/2012  . Vitamin D deficiency 11/03/2010  . DIVERTICULOSIS, COLON 11/03/2010  . Osteoarthritis (arthritis due to wear and tear of joints) 11/03/2010  . Hyperlipidemia 12/01/2009  . HYPERTENSION, BENIGN 12/01/2009  . CAD, ARTERY BYPASS GRAFT 12/01/2009  . Type 2 diabetes  mellitus with neurological manifestations, controlled (HCC)     Current Outpatient Prescriptions on File Prior to Visit  Medication Sig Dispense Refill  . aspirin EC 81 MG tablet Take 81 mg by mouth daily.    Marland Kitchen atorvastatin (LIPITOR) 20 MG tablet TAKE 1 TABLET DAILY AT 6 P.M. 90 tablet 1  . carvedilol (COREG) 6.25 MG tablet Take 1 tablet (6.25 mg total) by mouth 2 (two) times daily with a meal. 180 tablet 3  . celecoxib (CELEBREX) 200 MG capsule Take 200 mg by mouth 2 (two) times daily.    . cholecalciferol (VITAMIN D) 1000 UNITS tablet Take 1,000 Units by mouth daily.    . clopidogrel (PLAVIX) 75 MG tablet Take 1 tablet (75 mg total) by mouth daily. 90 tablet 3  . HYDROcodone-acetaminophen (NORCO/VICODIN) 5-325 MG per tablet Take 1 tablet by mouth every 6 (six) hours as needed.     . metFORMIN (GLUCOPHAGE-XR) 500 MG 24 hr tablet TAKE 1 TABLET TWICE A DAY 180 tablet 2  . Multiple Vitamin (MULTIVITAMIN WITH MINERALS) TABS tablet Take 1 tablet by mouth daily.    . niacin (NIASPAN) 1000 MG CR tablet Take 1 tablet (1,000 mg total) by mouth at bedtime. 90 tablet 3   No current facility-administered medications on file prior to visit.     Past Medical History:  Diagnosis Date  . Colonic polyp 2011 last colo   colo q 44yr - [facility]   .  Coronary artery disease 02/2009   severe left main and three-vessel, CABG  . DEGENERATIVE JOINT DISEASE   . Diverticulosis of colon   . DIVERTICULOSIS, COLON   . HYPERLIPIDEMIA-MIXED   . HYPERTENSION, BENIGN   . LBP (low back pain)    s/p decompression 8/14; ESI - [place]; RFA x 3 early 2015  . Type II or unspecified type diabetes mellitus without mention of complication, not stated as uncontrolled   . VITAMIN D DEFICIENCY     Past Surgical History:  Procedure Laterality Date  . CARPAL TUNNEL RELEASE Right   . CATARACT EXTRACTION, BILATERAL     R 07/07/13, L 07/21/13  . CORONARY ARTERY BYPASS GRAFT  03/17/09   x 4  . INGUINAL HERNIA REPAIR  1980  . KNEE  ARTHROSCOPY  2007   right  . LUMBAR EPIDURAL INJECTION  2013   Has as needed.  . LUMBAR LAMINECTOMY/DECOMPRESSION MICRODISCECTOMY N/A 06/19/2013   Procedure: LUMBAR LAMINECTOMY/DECOMPRESSION MICRODISCECTOMY;  Surgeon: Emilee HeroMark Leonard Dumonski, MD;  [facility];  Service: Orthopedics;  Laterality: N/A;  Lumbar 4-5 decompression  . TRIGGER FINGER RELEASE Right     Social History   Social History  . Marital status: Married    Spouse name: N/A  . Number of children: N/A  . Years of education: N/A   Social History Main Topics  . Smoking status: Never Smoker  . Smokeless tobacco: Never Used     Comment: Married, 3 grown children. Retired 04/2002 from [employer] work  . Alcohol use Yes  . Drug use: No  . Sexual activity: Not on file   Other Topics Concern  . Not on file   Social History Narrative  . No narrative on file    Family History  Problem Relation Age of Onset  . Heart failure Mother        died age 81  . Heart failure Father        died age 81  . Diabetes Other   . Heart disease Neg Hx     Review of Systems  Constitutional: Negative for chills and fever.  Respiratory: Negative for cough, shortness of breath and wheezing.   Cardiovascular: Positive for leg swelling (left ankle). Negative for chest pain and palpitations.  Neurological: Positive for light-headedness (occasional). Negative for headaches.       Objective:   Vitals:   03/26/17 0831  BP: 120/84  Pulse: 68  Resp: 16  Temp: 97.7 F (36.5 C)   Wt Readings from Last 3 Encounters:  03/26/17 180 lb (81.6 kg)  01/23/17 188 lb (85.3 kg)  12/21/16 187 lb 8 oz (85 kg)   Body mass index is 27.37 kg/m.   Physical Exam    Constitutional: Appears well-developed and well-nourished. No distress.  HENT:  Head: Normocephalic and atraumatic.  Neck: Neck supple. No tracheal deviation present. No thyromegaly present.  No cervical lymphadenopathy Cardiovascular: Normal rate, regular rhythm  and normal heart sounds.   1/6 systolic murmur heard. No carotid bruit .  No edema RLE, mild LLE.  1+ pulses TP and DP b/l LE Pulmonary/Chest: Effort normal and breath sounds normal. No respiratory distress. No has no wheezes. No rales.  Skin: Skin is warm and dry. Not diaphoretic.  Psychiatric: Normal mood and affect. Behavior is normal.      Assessment & Plan:    See Problem List for Assessment and Plan of chronic medical problems.    FU in 6 months

## 2017-03-26 ENCOUNTER — Encounter: Payer: Self-pay | Admitting: Internal Medicine

## 2017-03-26 ENCOUNTER — Other Ambulatory Visit (INDEPENDENT_AMBULATORY_CARE_PROVIDER_SITE_OTHER): Payer: Medicare Other

## 2017-03-26 ENCOUNTER — Ambulatory Visit (INDEPENDENT_AMBULATORY_CARE_PROVIDER_SITE_OTHER): Payer: Medicare Other | Admitting: Internal Medicine

## 2017-03-26 VITALS — BP 120/84 | HR 68 | Temp 97.7°F | Resp 16 | Ht 68.0 in | Wt 180.0 lb

## 2017-03-26 DIAGNOSIS — E1149 Type 2 diabetes mellitus with other diabetic neurological complication: Secondary | ICD-10-CM

## 2017-03-26 DIAGNOSIS — I10 Essential (primary) hypertension: Secondary | ICD-10-CM

## 2017-03-26 DIAGNOSIS — E782 Mixed hyperlipidemia: Secondary | ICD-10-CM | POA: Diagnosis not present

## 2017-03-26 DIAGNOSIS — I25708 Atherosclerosis of coronary artery bypass graft(s), unspecified, with other forms of angina pectoris: Secondary | ICD-10-CM

## 2017-03-26 LAB — COMPREHENSIVE METABOLIC PANEL
ALK PHOS: 59 U/L (ref 39–117)
ALT: 19 U/L (ref 0–53)
AST: 16 U/L (ref 0–37)
Albumin: 4.4 g/dL (ref 3.5–5.2)
BILIRUBIN TOTAL: 0.6 mg/dL (ref 0.2–1.2)
BUN: 29 mg/dL — AB (ref 6–23)
CO2: 26 mEq/L (ref 19–32)
CREATININE: 1.21 mg/dL (ref 0.40–1.50)
Calcium: 9.7 mg/dL (ref 8.4–10.5)
Chloride: 104 mEq/L (ref 96–112)
GFR: 61.21 mL/min (ref >60.00)
GLUCOSE: 130 mg/dL — AB (ref 70–99)
Potassium: 4.3 mEq/L (ref 3.5–5.1)
SODIUM: 139 meq/L (ref 135–145)
TOTAL PROTEIN: 7.3 g/dL (ref 6.0–8.3)

## 2017-03-26 LAB — HEMOGLOBIN A1C: Hgb A1c MFr Bld: 6.8 % — ABNORMAL HIGH (ref 4.6–6.5)

## 2017-03-26 LAB — LIPID PANEL
Cholesterol: 157 mg/dL (ref 0–200)
HDL: 53.5 mg/dL (ref >39.00)
LDL Cholesterol: 73 mg/dL (ref 0–99)
NONHDL: 103.74
Total CHOL/HDL Ratio: 3
Triglycerides: 152 mg/dL — ABNORMAL HIGH (ref 0.0–149.0)
VLDL: 30.4 mg/dL (ref 0.0–40.0)

## 2017-03-26 MED ORDER — ATORVASTATIN CALCIUM 20 MG PO TABS: 20.0000 mg | ORAL_TABLET | Freq: Every day | ORAL | 3 refills | Status: DC

## 2017-03-26 NOTE — Assessment & Plan Note (Signed)
BP well controlled Current regimen effective and well tolerated Continue current medications at current doses cmp  

## 2017-03-26 NOTE — Assessment & Plan Note (Addendum)
Having lightheadedness intermittently - cardiac w/u unrevealing Monitor BP - has been well controlled Continue plavix, asa, statin

## 2017-03-26 NOTE — Assessment & Plan Note (Signed)
Check a1c Low sugar / carb diet Stressed regular exercise, keeping weight down  

## 2017-03-26 NOTE — Assessment & Plan Note (Signed)
Check lipid panel  Continue daily statin Regular exercise and healthy diet encouraged  

## 2017-06-15 ENCOUNTER — Emergency Department (HOSPITAL_COMMUNITY): Payer: Medicare Other

## 2017-06-15 ENCOUNTER — Encounter (HOSPITAL_COMMUNITY): Payer: Self-pay | Admitting: Emergency Medicine

## 2017-06-15 ENCOUNTER — Inpatient Hospital Stay (HOSPITAL_COMMUNITY)
Admission: EM | Admit: 2017-06-15 | Discharge: 2017-06-17 | DRG: 244 | Disposition: A | Discharge status: Home / Self Care | Payer: Medicare Other | Attending: Cardiology | Admitting: Cardiology

## 2017-06-15 DIAGNOSIS — Z791 Long term (current) use of non-steroidal anti-inflammatories (NSAID): Secondary | ICD-10-CM | POA: Diagnosis not present

## 2017-06-15 DIAGNOSIS — Z880 Allergy status to penicillin: Secondary | ICD-10-CM

## 2017-06-15 DIAGNOSIS — Z833 Family history of diabetes mellitus: Secondary | ICD-10-CM

## 2017-06-15 DIAGNOSIS — E782 Mixed hyperlipidemia: Secondary | ICD-10-CM | POA: Diagnosis present

## 2017-06-15 DIAGNOSIS — Z8249 Family history of ischemic heart disease and other diseases of the circulatory system: Secondary | ICD-10-CM | POA: Diagnosis not present

## 2017-06-15 DIAGNOSIS — E119 Type 2 diabetes mellitus without complications: Secondary | ICD-10-CM | POA: Diagnosis present

## 2017-06-15 DIAGNOSIS — I452 Bifascicular block: Secondary | ICD-10-CM | POA: Diagnosis present

## 2017-06-15 DIAGNOSIS — Z7984 Long term (current) use of oral hypoglycemic drugs: Secondary | ICD-10-CM

## 2017-06-15 DIAGNOSIS — Z950 Presence of cardiac pacemaker: Secondary | ICD-10-CM

## 2017-06-15 DIAGNOSIS — Z951 Presence of aortocoronary bypass graft: Secondary | ICD-10-CM | POA: Diagnosis not present

## 2017-06-15 DIAGNOSIS — I2510 Atherosclerotic heart disease of native coronary artery without angina pectoris: Secondary | ICD-10-CM | POA: Diagnosis present

## 2017-06-15 DIAGNOSIS — Z7902 Long term (current) use of antithrombotics/antiplatelets: Secondary | ICD-10-CM | POA: Diagnosis not present

## 2017-06-15 DIAGNOSIS — I442 Atrioventricular block, complete: Principal | ICD-10-CM | POA: Diagnosis present

## 2017-06-15 DIAGNOSIS — Z9841 Cataract extraction status, right eye: Secondary | ICD-10-CM

## 2017-06-15 DIAGNOSIS — I10 Essential (primary) hypertension: Secondary | ICD-10-CM | POA: Diagnosis present

## 2017-06-15 DIAGNOSIS — Z9842 Cataract extraction status, left eye: Secondary | ICD-10-CM

## 2017-06-15 DIAGNOSIS — Z7982 Long term (current) use of aspirin: Secondary | ICD-10-CM

## 2017-06-15 HISTORY — DX: Presence of cardiac pacemaker: Z95.0

## 2017-06-15 HISTORY — DX: Bradycardia, unspecified: R00.1

## 2017-06-15 LAB — BASIC METABOLIC PANEL
Anion gap: 10 (ref 5–15)
BUN: 30 mg/dL — AB (ref 6–20)
CHLORIDE: 107 mmol/L (ref 101–111)
CO2: 23 mmol/L (ref 22–32)
CREATININE: 1.43 mg/dL — AB (ref 0.61–1.24)
Calcium: 9.4 mg/dL (ref 8.9–10.3)
GFR calc Af Amer: 51 mL/min — ABNORMAL LOW (ref >60)
GFR calc non Af Amer: 44 mL/min — ABNORMAL LOW (ref >60)
Glucose, Bld: 161 mg/dL — ABNORMAL HIGH (ref 65–99)
POTASSIUM: 4.6 mmol/L (ref 3.5–5.1)
SODIUM: 140 mmol/L (ref 135–145)

## 2017-06-15 LAB — CBC WITH DIFFERENTIAL/PLATELET
Basophils Absolute: 0 10*3/uL (ref 0.0–0.1)
Basophils Relative: 0 %
EOS ABS: 0.1 10*3/uL (ref 0.0–0.7)
EOS PCT: 1 %
HCT: 39 % (ref 39.0–52.0)
Hemoglobin: 12.7 g/dL — ABNORMAL LOW (ref 13.0–17.0)
LYMPHS ABS: 2.1 10*3/uL (ref 0.7–4.0)
LYMPHS PCT: 22 %
MCH: 32.5 pg (ref 26.0–34.0)
MCHC: 32.6 g/dL (ref 30.0–36.0)
MCV: 99.7 fL (ref 78.0–100.0)
MONOS PCT: 8 %
Monocytes Absolute: 0.7 10*3/uL (ref 0.1–1.0)
NEUTROS PCT: 69 %
Neutro Abs: 6.6 10*3/uL (ref 1.7–7.7)
PLATELETS: 196 10*3/uL (ref 150–400)
RBC: 3.91 MIL/uL — AB (ref 4.22–5.81)
RDW: 14.4 % (ref 11.5–15.5)
WBC: 9.5 10*3/uL (ref 4.0–10.5)

## 2017-06-15 LAB — GLUCOSE, CAPILLARY: GLUCOSE-CAPILLARY: 107 mg/dL — AB (ref 65–99)

## 2017-06-15 LAB — I-STAT TROPONIN, ED: TROPONIN I, POC: 0 ng/mL (ref 0.00–0.08)

## 2017-06-15 LAB — MRSA PCR SCREENING: MRSA by PCR: NEGATIVE

## 2017-06-15 LAB — TSH: TSH: 1.087 u[IU]/mL (ref 0.350–4.500)

## 2017-06-15 MED ORDER — SODIUM CHLORIDE 0.9 % IV SOLN
250.0000 mL | INTRAVENOUS | Status: DC | PRN
  Administered 2017-06-15: 250 mL via INTRAVENOUS

## 2017-06-15 MED ORDER — ATROPINE SULFATE 1 MG/10ML IJ SOSY
PREFILLED_SYRINGE | INTRAMUSCULAR | Status: AC
  Filled 2017-06-15: qty 10

## 2017-06-15 MED ORDER — CELECOXIB 200 MG PO CAPS: 200.0000 mg | ORAL_CAPSULE | Freq: Two times a day (BID) | ORAL | Status: DC

## 2017-06-15 MED ORDER — ONDANSETRON HCL 4 MG/2ML IJ SOLN: 4.0000 mg | Freq: Four times a day (QID) | INTRAMUSCULAR | Status: DC | PRN

## 2017-06-15 MED ORDER — ASPIRIN EC 81 MG PO TBEC
81.0000 mg | DELAYED_RELEASE_TABLET | Freq: Every day | ORAL | Status: DC
  Administered 2017-06-16 – 2017-06-17 (×2): 81 mg via ORAL
  Filled 2017-06-15 (×2): qty 1

## 2017-06-15 MED ORDER — SODIUM CHLORIDE 0.9% FLUSH: 3.0000 mL | INTRAVENOUS | Status: DC | PRN

## 2017-06-15 MED ORDER — SODIUM CHLORIDE 0.9 % IV BOLUS (SEPSIS)
500.0000 mL | Freq: Once | INTRAVENOUS | Status: AC
  Administered 2017-06-15: 500 mL via INTRAVENOUS

## 2017-06-15 MED ORDER — METFORMIN HCL ER 500 MG PO TB24
500.0000 mg | ORAL_TABLET | Freq: Two times a day (BID) | ORAL | Status: DC
  Filled 2017-06-15: qty 1

## 2017-06-15 MED ORDER — VITAMIN D 1000 UNITS PO TABS
1000.0000 [IU] | ORAL_TABLET | Freq: Every day | ORAL | Status: DC
  Administered 2017-06-16 – 2017-06-17 (×2): 1000 [IU] via ORAL
  Filled 2017-06-15 (×2): qty 1

## 2017-06-15 MED ORDER — CELECOXIB 200 MG PO CAPS
200.0000 mg | ORAL_CAPSULE | Freq: Two times a day (BID) | ORAL | Status: DC
  Administered 2017-06-15 – 2017-06-17 (×4): 200 mg via ORAL
  Filled 2017-06-15 (×4): qty 1

## 2017-06-15 MED ORDER — ACETAMINOPHEN 325 MG PO TABS: 650.0000 mg | ORAL_TABLET | ORAL | Status: DC | PRN

## 2017-06-15 MED ORDER — HYDROCODONE-ACETAMINOPHEN 5-325 MG PO TABS
1.0000 | ORAL_TABLET | Freq: Four times a day (QID) | ORAL | Status: DC | PRN
  Administered 2017-06-16: 1 via ORAL
  Filled 2017-06-15: qty 1

## 2017-06-15 MED ORDER — SODIUM CHLORIDE 0.9% FLUSH
3.0000 mL | Freq: Two times a day (BID) | INTRAVENOUS | Status: DC
  Administered 2017-06-15 – 2017-06-16 (×3): 3 mL via INTRAVENOUS

## 2017-06-15 MED ORDER — CLOPIDOGREL BISULFATE 75 MG PO TABS
75.0000 mg | ORAL_TABLET | Freq: Every day | ORAL | Status: DC
  Administered 2017-06-16: 75 mg via ORAL
  Filled 2017-06-15: qty 1

## 2017-06-15 MED ORDER — METFORMIN HCL ER 500 MG PO TB24
500.0000 mg | ORAL_TABLET | Freq: Two times a day (BID) | ORAL | Status: DC
  Administered 2017-06-16 – 2017-06-17 (×3): 500 mg via ORAL
  Filled 2017-06-15 (×4): qty 1

## 2017-06-15 MED ORDER — ADULT MULTIVITAMIN W/MINERALS CH
1.0000 | ORAL_TABLET | Freq: Every day | ORAL | Status: DC
  Administered 2017-06-16 – 2017-06-17 (×2): 1 via ORAL
  Filled 2017-06-15 (×2): qty 1

## 2017-06-15 MED ORDER — ATORVASTATIN CALCIUM 20 MG PO TABS
20.0000 mg | ORAL_TABLET | Freq: Every day | ORAL | Status: DC
  Administered 2017-06-15 – 2017-06-16 (×2): 20 mg via ORAL
  Filled 2017-06-15 (×2): qty 1

## 2017-06-15 NOTE — ED Provider Notes (Signed)
Emergency Department Provider Note   I have reviewed the triage vital signs and the nursing notes.   HISTORY  Chief Complaint No chief complaint on file.   HPI Andrew [name] is a 83 y.o. male with PMH of CAD s/p CABG, HLD, HTN, and DM presents to the ED from his PCP office with symptomatic bradycardia. 3 days ago he began feeling very lightheaded with standing. If he's sitting still he has no symptoms. He denies any chest pain, palpitations, shortness of breath. No syncope. He denies any changes to medications. He went to his primary care physician today for further evaluation to called EMS for transport to the emergency department after obtaining vital signs in the clinic. During my evaluation the patient is completely asymptomatic.   Primary Cardiologist: Dr. Mariah Milling  Past Medical History:  Diagnosis Date  . Colonic polyp 2011 last colo   colo q 43yr - [facility]   . Coronary artery disease 02/2009   severe left main and three-vessel, CABG  . DEGENERATIVE JOINT DISEASE   . Diverticulosis of colon   . DIVERTICULOSIS, COLON   . HYPERLIPIDEMIA-MIXED   . HYPERTENSION, BENIGN   . LBP (low back pain)    s/p decompression 8/14; ESI - [place]; RFA x 3 early 2015  . Type II or unspecified type diabetes mellitus without mention of complication, not stated as uncontrolled   . VITAMIN D DEFICIENCY     Patient Active Problem List   Diagnosis Date Noted  . Complete heart block (HCC) 06/15/2017  . Chronic fatigue 01/20/2017  . Near syncope 12/21/2016  . Wears hearing aid 09/25/2016  . Degeneration of lumbar or lumbosacral intervertebral disc 03/23/2015  . Orthostatic hypotension 03/27/2013  . Generalized muscle ache 05/28/2012  . Vitamin D deficiency 11/03/2010  . DIVERTICULOSIS, COLON 11/03/2010  . Osteoarthritis (arthritis due to wear and tear of joints) 11/03/2010  . Hyperlipidemia 12/01/2009  . HYPERTENSION, BENIGN 12/01/2009  . CAD, ARTERY BYPASS GRAFT 12/01/2009  . Type 2  diabetes mellitus with neurological manifestations, controlled [facility])     Past Surgical History:  Procedure Laterality Date  . CARPAL TUNNEL RELEASE Right   . CATARACT EXTRACTION, BILATERAL     R 07/07/13, L 07/21/13  . CORONARY ARTERY BYPASS GRAFT  03/17/09   x 4  . INGUINAL HERNIA REPAIR  1980  . KNEE ARTHROSCOPY  2007   right  . LUMBAR EPIDURAL INJECTION  2013   Has as needed.  . LUMBAR LAMINECTOMY/DECOMPRESSION MICRODISCECTOMY N/A 06/19/2013   Procedure: LUMBAR LAMINECTOMY/DECOMPRESSION MICRODISCECTOMY;  Surgeon: Emilee Hero, MD;  Location: [facility] OR;  Service: Orthopedics;  Laterality: N/A;  Lumbar 4-5 decompression  . TRIGGER FINGER RELEASE Right       Allergies Penicillins  Family History  Problem Relation Age of Onset  . Heart failure Mother        died age 42  . Heart failure Father        died age 59  . Diabetes Other   . Heart disease Neg Hx     Social History Social History  Substance Use Topics  . Smoking status: Never Smoker  . Smokeless tobacco: Never Used     Comment: Married, 3 grown children. Retired 04/2002 from [employer] work  . Alcohol use Yes    Review of Systems  Constitutional: No fever/chills Eyes: No visual changes. ENT: No sore throat. Cardiovascular: Denies chest pain. Positive lightheadedness.  Respiratory: Denies shortness of breath. Gastrointestinal: No abdominal pain.  No nausea, no  vomiting.  No diarrhea.  No constipation. Genitourinary: Negative for dysuria. Musculoskeletal: Negative for back pain. Skin: Negative for rash. Neurological: Negative for headaches, focal weakness or numbness.  10-point ROS otherwise negative.  ____________________________________________   PHYSICAL EXAM:  VITAL SIGNS: ED Triage Vitals [06/15/17 1206]  Enc Vitals Group     BP 129/77     Pulse Rate (!) 29     Resp 14     Temp 97.7 F (36.5 C)     Temp Source Oral     SpO2 100 %   Constitutional: Alert and oriented.  Well appearing and in no acute distress. Eyes: Conjunctivae are normal.  Head: Atraumatic. Nose: No congestion/rhinnorhea. Mouth/Throat: Mucous membranes are moist.  Oropharynx non-erythematous. Neck: No stridor.   Cardiovascular: Bradycardia with complete heart block. Good peripheral circulation. Grossly normal heart sounds.   Respiratory: Normal respiratory effort.  No retractions. Lungs CTAB. Gastrointestinal: Soft and nontender. No distention.  Musculoskeletal: No lower extremity tenderness nor edema. No gross deformities of extremities. Neurologic:  Normal speech and language. No gross focal neurologic deficits are appreciated.  Skin:  Skin is warm, dry and intact. No rash noted.  ____________________________________________   LABS (all labs ordered are listed, but only abnormal results are displayed)  Labs Reviewed  BASIC METABOLIC PANEL - Abnormal; Notable for the following:       Result Value   Glucose, Bld 161 (*)    BUN 30 (*)    Creatinine, Ser 1.43 (*)    GFR calc non Af Amer 44 (*)    GFR calc Af Amer 51 (*)    All other components within normal limits  CBC WITH DIFFERENTIAL/PLATELET - Abnormal; Notable for the following:    RBC 3.91 (*)    Hemoglobin 12.7 (*)    All other components within normal limits  MRSA PCR SCREENING  TSH  I-STAT TROPONIN, ED   ____________________________________________  EKG   EKG Interpretation  Date/Time:  Friday June 15 2017 12:05:21 EDT Ventricular Rate:  30 PR Interval:    QRS Duration: 169 QT Interval:  539 QTC Calculation: 472 R Axis:   -61 Text Interpretation:  Complete AV block with wide QRS complex Atrial premature complex RBBB and LAFB No STEMI.  Confirmed by Alona Bene [phone]) on 06/15/2017 12:19:07 PM       ____________________________________________  RADIOLOGY  Dg Chest Portable 1 View  Result Date: 06/15/2017 CLINICAL DATA:  Bradycardia. History of hypertension and coronary artery disease. EXAM:  PORTABLE CHEST 1 VIEW COMPARISON:  Radiographs 06/13/2013. FINDINGS: 1249 hour. The heart size and mediastinal contours are stable status post median sternotomy and CABG. The upper sternotomy wire is chronically fractured. There are calcified mediastinal and left hilar lymph nodes. The lungs appear unchanged with suspected mild basilar scarring. There is no edema, confluent airspace opacity, pleural effusion or pneumothorax. No acute osseous findings are evident. IMPRESSION: Stable postoperative chest.  No acute cardiopulmonary process. Electronically Signed   By: Carey Bullocks M.D.   On: 06/15/2017 13:06    ____________________________________________   PROCEDURES  Procedure(s) performed:   Procedures  CRITICAL CARE Performed by: Maia Plan Total critical care time: 40 minutes Critical care time was exclusive of separately billable procedures and treating other patients. Critical care was necessary to treat or prevent imminent or life-threatening deterioration. Critical care was time spent personally by me on the following activities: development of treatment plan with patient and/or surrogate as well as nursing, discussions with consultants, evaluation of patient's response to  treatment, examination of patient, obtaining history from patient or surrogate, ordering and performing treatments and interventions, ordering and review of laboratory studies, ordering and review of radiographic studies, pulse oximetry and re-evaluation of patient's condition.  Alona Bene, MD Emergency Medicine  ____________________________________________   INITIAL IMPRESSION / ASSESSMENT AND PLAN / ED COURSE  Pertinent labs & imaging results that were available during my care of the patient were reviewed by me and considered in my medical decision making (see chart for details).  Patient presents to the emergency department for evaluation of symptomatic bradycardia. EKG on arrival shows complete heart  block. Manual blood pressure in the resuscitation bay is 120/70. Patient is awake and alert. Plan to involve cardiology early area and will obtain labs and chest x-ray. Plan for gentle IV fluids. No external pacing or medications at this time.   Spoke with cardiology who evaluated the patient for complete heart block and pacemaker. Patient remains awake and alert. Normal blood pressure. Labs reviewed with no acute abdomen allergies other than mild AKI.   Discussed patient's case with Cardiology to request admission. Patient and family (if present) updated with plan. Care transferred to Cardiology service.  I reviewed all nursing notes, vitals, pertinent old records, EKGs, labs, imaging (as available).  ____________________________________________  FINAL CLINICAL IMPRESSION(S) / ED DIAGNOSES  Final diagnoses:  Complete heart block (HCC)     MEDICATIONS GIVEN DURING THIS VISIT:  Medications  cholecalciferol (VITAMIN D) tablet 1,000 Units (not administered)  aspirin EC tablet 81 mg (not administered)  multivitamin with minerals tablet 1 tablet (not administered)  celecoxib (CELEBREX) capsule 200 mg (not administered)  HYDROcodone-acetaminophen (NORCO/VICODIN) 5-325 MG per tablet 1 tablet (not administered)  clopidogrel (PLAVIX) tablet 75 mg (not administered)  metFORMIN (GLUCOPHAGE-XR) 24 hr tablet 500 mg (not administered)  atorvastatin (LIPITOR) tablet 20 mg (not administered)  acetaminophen (TYLENOL) tablet 650 mg (not administered)  ondansetron (ZOFRAN) injection 4 mg (not administered)  sodium chloride flush (NS) 0.9 % injection 3 mL (not administered)  sodium chloride flush (NS) 0.9 % injection 3 mL (not administered)  0.9 %  sodium chloride infusion (not administered)  atropine 1 MG/10ML injection (not administered)  sodium chloride 0.9 % bolus 500 mL (0 mLs Intravenous Stopped 06/15/17 1345)     NEW OUTPATIENT MEDICATIONS STARTED DURING THIS VISIT:  None   Note:  This  document was prepared using Dragon voice recognition software and may include unintentional dictation errors.  Alona Bene, MD Emergency Medicine  Burdett Pinzon, Arlyss Repress, MD 06/15/17 [phone]

## 2017-06-15 NOTE — ED Triage Notes (Signed)
Patient brought in by [facility] for dizziness x3 days.  Patient found to have complete heart block, heart rate 30.  Patient alert and oriented and in no apparent distress at this time. 18g saline lock in left AC.  MD at bedside.

## 2017-06-15 NOTE — Progress Notes (Addendum)
Pt. admitted to unit, rm2H15 from ED, report from [place], [place]. Pt and spouse oriented to room, call bell, Ascom phones and staff. Bed in low position, fall safety plan reviewed,  yellow non-skid socks in place. Full assessment to Epic; skin assessed with Dannette Barbara., RN. EP aware of pt arrival to floor, HR sustaining in mid 20s, pt asymptomatic at this time. Glory Buff also made aware that it is very difficult to obtain an automatic or manual BP. Atropine at bedside as a precaution. Will continue to monitor closely.

## 2017-06-15 NOTE — H&P (Signed)
ELECTROPHYSIOLOGY HISTORY AND PHYSICAL    [name] MRN: [id number], DOB/AGE: 28-Sep-1934 83 y.o.  Admit date: 06/15/2017 Date of Consult: 06/15/2017  Primary Physician: Pincus Sanes, MD Primary Cardiologist: Mariah Milling  Patient Profile: Andrew [name] is a 83 y.o. male with a history of CAD s/p CABG, HTN, diabetes, hyperlipidemia who is being seen today for the evaluation of complete heart block at the request of ER MD.  HPI:  Andrew [name] is a 83 y.o. male with a several day history of progressive fatigue, dyspnea on exertion and dizziness.  He was seen in Urgent Care this morning for evaluation and was found to be in complete heart block.  He was then transferred to [facility] for further evaluation.  In the ER, he declines symptoms at rest.  His blood pressure is stable. He has not had frank syncope.  He has not had recent cardiac imaging.   He is on Coreg at home with last dose this morning.   He denies chest pain, palpitations, dyspnea, PND, orthopnea, vomiting, syncope, edema, weight gain, or early satiety.  Past Medical History:  Diagnosis Date  . Colonic polyp 2011 last colo   colo q 54yr - [facility]   . Coronary artery disease 02/2009   severe left main and three-vessel, CABG  . DEGENERATIVE JOINT DISEASE   . Diverticulosis of colon   . DIVERTICULOSIS, COLON   . HYPERLIPIDEMIA-MIXED   . HYPERTENSION, BENIGN   . LBP (low back pain)    s/p decompression 8/14; ESI - [place]; RFA x 3 early 2015  . Type II or unspecified type diabetes mellitus without mention of complication, not stated as uncontrolled   . VITAMIN D DEFICIENCY      Surgical History:  Past Surgical History:  Procedure Laterality Date  . CARPAL TUNNEL RELEASE Right   . CATARACT EXTRACTION, BILATERAL     R 07/07/13, L 07/21/13  . CORONARY ARTERY BYPASS GRAFT  03/17/09   x 4  . INGUINAL HERNIA REPAIR  1980  . KNEE ARTHROSCOPY  2007   right  . LUMBAR EPIDURAL INJECTION  2013   Has as needed.  . LUMBAR  LAMINECTOMY/DECOMPRESSION MICRODISCECTOMY N/A 06/19/2013   Procedure: LUMBAR LAMINECTOMY/DECOMPRESSION MICRODISCECTOMY;  Surgeon: Emilee Hero, MD;  Location: [facility] OR;  Service: Orthopedics;  Laterality: N/A;  Lumbar 4-5 decompression  . TRIGGER FINGER RELEASE Right     No current facility-administered medications for this encounter.   Current Outpatient Prescriptions:  .  aspirin EC 81 MG tablet, Take 81 mg by mouth daily., Disp: , Rfl:  .  atorvastatin (LIPITOR) 20 MG tablet, Take 1 tablet (20 mg total) by mouth daily at 6 PM., Disp: 90 tablet, Rfl: 3 .  carvedilol (COREG) 6.25 MG tablet, Take 1 tablet (6.25 mg total) by mouth 2 (two) times daily with a meal., Disp: 180 tablet, Rfl: 3 .  celecoxib (CELEBREX) 200 MG capsule, Take 200 mg by mouth 2 (two) times daily., Disp: , Rfl:  .  cholecalciferol (VITAMIN D) 1000 UNITS tablet, Take 1,000 Units by mouth daily., Disp: , Rfl:  .  clopidogrel (PLAVIX) 75 MG tablet, Take 1 tablet (75 mg total) by mouth daily., Disp: 90 tablet, Rfl: 3 .  HYDROcodone-acetaminophen (NORCO/VICODIN) 5-325 MG per tablet, Take 1 tablet by mouth every 6 (six) hours as needed. , Disp: , Rfl:  .  metFORMIN (GLUCOPHAGE-XR) 500 MG 24 hr tablet, TAKE 1 TABLET TWICE A DAY, Disp: 180 tablet, Rfl: 2 .  Multiple Vitamin (MULTIVITAMIN  WITH MINERALS) TABS tablet, Take 1 tablet by mouth daily., Disp: , Rfl:  .  niacin (NIASPAN) 1000 MG CR tablet, Take 1 tablet (1,000 mg total) by mouth at bedtime., Disp: 90 tablet, Rfl: 3  Allergies:  Allergies  Allergen Reactions  . Penicillins Other (See Comments)    Taste buds    Social History   Social History  . Marital status: Married    Spouse name: N/A  . Number of children: N/A  . Years of education: N/A   Occupational History  . Not on file.   Social History Main Topics  . Smoking status: Never Smoker  . Smokeless tobacco: Never Used     Comment: Married, 3 grown children. Retired 04/2002 from [employer] and  [employer] work  . Alcohol use Yes  . Drug use: No  . Sexual activity: Not on file   Other Topics Concern  . Not on file   Social History Narrative  . No narrative on file     Family History  Problem Relation Age of Onset  . Heart failure Mother        died age 81  . Heart failure Father        died age 15  . Diabetes Other   . Heart disease Neg Hx      Review of Systems: All other systems reviewed and are otherwise negative except as noted above.  Physical Exam: Vitals:   06/15/17 1206  BP: 129/77  Pulse: (!) 29  Resp: 14  Temp: 97.7 F (36.5 C)  TempSrc: Oral  SpO2: 100%    GEN- The patient is well appearing, alert and oriented x 3 today.   HEENT: normocephalic, atraumatic; sclera clear, conjunctiva pink; hearing intact; oropharynx clear; neck supple Lungs- Clear to ausculation bilaterally, normal work of breathing.  No wheezes, rales, rhonchi Heart- Regular rate and rhythm, no murmurs, rubs or gallops GI- soft, non-tender, non-distended, bowel sounds present Extremities- no clubbing, cyanosis, or edema; DP/PT/radial pulses 2+ bilaterally MS- no significant deformity or atrophy Skin- warm and dry, no rash or lesion Psych- euthymic mood, full affect Neuro- strength and sensation are intact  Labs:  Lab Results  Component Value Date   WBC 9.5 06/15/2017   HGB 12.7 (L) 06/15/2017   HCT 39.0 06/15/2017   MCV 99.7 06/15/2017   PLT 196 06/15/2017    Recent Labs Lab 06/15/17 1215  NA 140  K 4.6  CL 107  CO2 23  BUN 30*  CREATININE 1.43*  CALCIUM 9.4  GLUCOSE 161*      Radiology/Studies: No results found.  BJY:[id number] heart block, ventricular rate 30, RBBB, LAFB (personally reviewed)  TELEMETRY: complete heart block  (personally reviewed)  Assessment/Plan: 1.  Symptomatic complete heart block He is on Coreg at home and Andrew [name] need washout. Andrew [name] admit to ICU for close monitoring. His escape rhythm is worrisome with bifascicular block.  I think  he Andrew [name] require permanent pacing.  Keep pads on patient, atropine at bedside Temp wire if escape rhythm deteriorates I have tentatively placed on board for [facility] Monday. He would be a good candidate for His Bundle pacing.  Echo  2.  CAD s/p CABG No recent ischemic symptoms Continue current therapy No BB with heart block  3.  HTN Stable No change required today  Signed, Andrew [name] 06/15/2017 1:05 PM  I have seen and examined this patient with Andrew [name].  Agree with above, note added to reflect my findings.  On exam, bradycardic, no  murmurs, lungs clear.  presentedto the hospital with 3 days of fatigue, weakness, shortness of breath.EKG on presentation showed complete AV block with a right bundle, left anterior fascicular block escape morphology. He does take carvedilol 6.25 mg twice a day. He had his dose this morning. We'll plan to hold carvedilol to see if conduction should return. He may require pacemaker in the future. In the interim, we'll get a transthoracic echocardiogram.  Andrew [name] M. Daishaun Ayre MD 06/15/2017 6:52 PM

## 2017-06-16 ENCOUNTER — Encounter (HOSPITAL_COMMUNITY)
Admission: EM | Disposition: A | Discharge status: Home / Self Care | Payer: Self-pay | Source: Home / Self Care | Attending: Cardiology

## 2017-06-16 ENCOUNTER — Inpatient Hospital Stay (HOSPITAL_COMMUNITY): Payer: Medicare Other

## 2017-06-16 DIAGNOSIS — I442 Atrioventricular block, complete: Secondary | ICD-10-CM

## 2017-06-16 HISTORY — PX: PACEMAKER IMPLANT: EP1218

## 2017-06-16 LAB — BASIC METABOLIC PANEL
Anion gap: 10 (ref 5–15)
BUN: 25 mg/dL — AB (ref 6–20)
CHLORIDE: 109 mmol/L (ref 101–111)
CO2: 21 mmol/L — AB (ref 22–32)
CREATININE: 1.29 mg/dL — AB (ref 0.61–1.24)
Calcium: 8.7 mg/dL — ABNORMAL LOW (ref 8.9–10.3)
GFR calc Af Amer: 58 mL/min — ABNORMAL LOW (ref >60)
GFR calc non Af Amer: 50 mL/min — ABNORMAL LOW (ref >60)
Glucose, Bld: 136 mg/dL — ABNORMAL HIGH (ref 65–99)
POTASSIUM: 3.8 mmol/L (ref 3.5–5.1)
Sodium: 140 mmol/L (ref 135–145)

## 2017-06-16 LAB — CBC
HEMATOCRIT: 38.4 % — AB (ref 39.0–52.0)
Hemoglobin: 12.7 g/dL — ABNORMAL LOW (ref 13.0–17.0)
MCH: 32.5 pg (ref 26.0–34.0)
MCHC: 33.1 g/dL (ref 30.0–36.0)
MCV: 98.2 fL (ref 78.0–100.0)
PLATELETS: 167 10*3/uL (ref 150–400)
RBC: 3.91 MIL/uL — AB (ref 4.22–5.81)
RDW: 14.1 % (ref 11.5–15.5)
WBC: 8.2 10*3/uL (ref 4.0–10.5)

## 2017-06-16 SURGERY — PACEMAKER IMPLANT
Anesthesia: LOCAL

## 2017-06-16 MED ORDER — SODIUM CHLORIDE 0.9 % IR SOLN
80.0000 mg | Status: AC
  Administered 2017-06-16: 80 mg

## 2017-06-16 MED ORDER — VANCOMYCIN HCL IN DEXTROSE 1-5 GM/200ML-% IV SOLN
1000.0000 mg | Freq: Two times a day (BID) | INTRAVENOUS | Status: AC
  Administered 2017-06-17: 1000 mg via INTRAVENOUS
  Filled 2017-06-16: qty 200

## 2017-06-16 MED ORDER — HEPARIN (PORCINE) IN NACL 2-0.9 UNIT/ML-% IJ SOLN
INTRAMUSCULAR | Status: AC
  Filled 2017-06-16: qty 500

## 2017-06-16 MED ORDER — LIDOCAINE HCL (PF) 1 % IJ SOLN
INTRAMUSCULAR | Status: AC
  Filled 2017-06-16: qty 60

## 2017-06-16 MED ORDER — LIDOCAINE HCL (PF) 1 % IJ SOLN
INTRAMUSCULAR | Status: DC | PRN
  Administered 2017-06-16: 45 mL

## 2017-06-16 MED ORDER — SODIUM CHLORIDE 0.9 % IR SOLN
Status: AC
  Filled 2017-06-16: qty 2

## 2017-06-16 MED ORDER — VANCOMYCIN HCL IN DEXTROSE 1-5 GM/200ML-% IV SOLN: 1000.0000 mg | INTRAVENOUS | Status: DC

## 2017-06-16 MED ORDER — SODIUM CHLORIDE 0.9 % IR SOLN
Status: DC | PRN
  Administered 2017-06-16: 13:00:00

## 2017-06-16 MED ORDER — VANCOMYCIN HCL 1000 MG IV SOLR
INTRAVENOUS | Status: AC | PRN
  Administered 2017-06-16: 1000 mg via INTRAVENOUS

## 2017-06-16 MED ORDER — SODIUM CHLORIDE 0.9 % IV SOLN: INTRAVENOUS | Status: DC

## 2017-06-16 MED ORDER — ONDANSETRON HCL 4 MG/2ML IJ SOLN: 4.0000 mg | Freq: Four times a day (QID) | INTRAMUSCULAR | Status: DC | PRN

## 2017-06-16 MED ORDER — ACETAMINOPHEN 325 MG PO TABS: 325.0000 mg | ORAL_TABLET | ORAL | Status: DC | PRN

## 2017-06-16 MED ORDER — VANCOMYCIN HCL IN DEXTROSE 1-5 GM/200ML-% IV SOLN
INTRAVENOUS | Status: AC
Start: 2017-06-16 — End: 2017-06-16
  Filled 2017-06-16: qty 200

## 2017-06-16 MED ORDER — HEPARIN (PORCINE) IN NACL 2-0.9 UNIT/ML-% IJ SOLN
INTRAMUSCULAR | Status: AC | PRN
Start: 2017-06-16 — End: 2017-06-16
  Administered 2017-06-16: 500 mL

## 2017-06-16 MED ORDER — YOU HAVE A PACEMAKER BOOK
Freq: Once | Status: AC
  Administered 2017-06-17: 06:00:00
  Filled 2017-06-16: qty 1

## 2017-06-16 MED ORDER — LIDOCAINE HCL (PF) 1 % IJ SOLN
INTRAMUSCULAR | Status: AC
  Filled 2017-06-16: qty 30

## 2017-06-16 SURGICAL SUPPLY — 7 items
CABLE SURGICAL S-101-97-12 (CABLE) ×2 IMPLANT
LEAD TENDRIL MRI 46CM LPA1200M (Lead) ×2 IMPLANT
LEAD TENDRIL MRI 52CM LPA1200M (Lead) ×2 IMPLANT
PACEMAKER ASSURITY DR-RF (Pacemaker) ×2 IMPLANT
PAD DEFIB LIFELINK (PAD) ×2 IMPLANT
SHEATH CLASSIC 8F (SHEATH) ×4 IMPLANT
TRAY PACEMAKER INSERTION (PACKS) ×2 IMPLANT

## 2017-06-16 NOTE — H&P (View-Only) (Signed)
Progress Note  Patient Name: Andrew [name] Date of Encounter: 06/16/2017  Primary Cardiologist: Andrew [name]  Subjective   Feels poorly, no chest pain or sob  Inpatient Medications    Scheduled Meds: . aspirin EC  81 mg Oral Daily  . atorvastatin  20 mg Oral q1800  . celecoxib  200 mg Oral BID  . cholecalciferol  1,000 Units Oral Daily  . clopidogrel  75 mg Oral Daily  . metFORMIN  500 mg Oral BID WC  . multivitamin with minerals  1 tablet Oral Daily  . sodium chloride flush  3 mL Intravenous Q12H   Continuous Infusions: . sodium chloride 250 mL (06/16/17 0600)   PRN Meds: sodium chloride, acetaminophen, HYDROcodone-acetaminophen, ondansetron (ZOFRAN) IV, sodium chloride flush   Vital Signs    Vitals:   06/16/17 0500 06/16/17 0600 06/16/17 0700 06/16/17 0800  BP:      Pulse: (!) 48 (!) 28 (!) 27 (!) 132  Resp: 15 14 13 16   Temp:   98.2 F (36.8 C)   TempSrc:   Oral   SpO2: 95% 97% 98% 98%  Weight:      Height:        Intake/Output Summary (Last 24 hours) at 06/16/17 1029 Last data filed at 06/16/17 0600  Gross per 24 hour  Intake           140.67 ml  Output             1000 ml  Net          -859.33 ml   Filed Weights   06/15/17 1500  Weight: 175 lb 0.7 oz (79.4 kg)    Telemetry    NSR with CHB - Personally Reviewed  ECG    NSR with CHB - Personally Reviewed  Physical Exam   GEN: elderly man, No acute distress.   Neck: 9 cm JVD Cardiac: Reg brady, no murmurs, rubs, or gallops.  Respiratory: Clear to auscultation bilaterally. GI: Soft, nontender, non-distended  MS: No edema; No deformity. Neuro:  Nonfocal  Psych: Normal affect   Labs    Chemistry Recent Labs Lab 06/15/17 1215 06/16/17 0546  NA 140 140  K 4.6 3.8  CL 107 109  CO2 23 21*  GLUCOSE 161* 136*  BUN 30* 25*  CREATININE 1.43* 1.29*  CALCIUM 9.4 8.7*  GFRNONAA 44* 50*  GFRAA 51* 58*  ANIONGAP 10 10     Hematology Recent Labs Lab 06/15/17 1215 06/16/17 0546  WBC 9.5  8.2  RBC 3.91* 3.91*  HGB 12.7* 12.7*  HCT 39.0 38.4*  MCV 99.7 98.2  MCH 32.5 32.5  MCHC 32.6 33.1  RDW 14.4 14.1  PLT 196 167    Cardiac EnzymesNo results for input(s): TROPONINI in the last 168 hours.  Recent Labs Lab 06/15/17 1218  TROPIPOC 0.00     BNPNo results for input(s): BNP, PROBNP in the last 168 hours.   DDimer No results for input(s): DDIMER in the last 168 hours.   Radiology    Dg Chest Portable 1 View  Result Date: 06/15/2017 CLINICAL DATA:  Bradycardia. History of hypertension and coronary artery disease. EXAM: PORTABLE CHEST 1 VIEW COMPARISON:  Radiographs 06/13/2013. FINDINGS: 1249 hour. The heart size and mediastinal contours are stable status post median sternotomy and CABG. The upper sternotomy wire is chronically fractured. There are calcified mediastinal and left hilar lymph nodes. The lungs appear unchanged with suspected mild basilar scarring. There is no edema, confluent airspace opacity, pleural effusion or  pneumothorax. No acute osseous findings are evident. IMPRESSION: Stable postoperative chest.  No acute cardiopulmonary process. Electronically Signed   By: Carey Bullocks M.D.   On: 06/15/2017 13:06    Cardiac Studies   none  Patient Profile     83 y.o. male admitted with CHB, now coreg washed out with persistent CHB.  Assessment & Plan    1. CHB - he remains profoundly bradycardic with HR's in the 20's. Will plan to proceed with PPM insertion. 2. HTN - once his PPM is in place, will plan to restart his beta blocker.  Signed, Andrew Bunting, MD  06/16/2017, 10:29 AM  [name], male   DOB: 09-18-1934, 83 y.o.   MRN: [id number]

## 2017-06-16 NOTE — Interval H&P Note (Signed)
History and Physical Interval Note:  06/16/2017 12:05 PM  Andrew [name]  has presented today for surgery, with the diagnosis of Heart Block  The various methods of treatment have been discussed with the patient and family. After consideration of risks, benefits and other options for treatment, the patient has consented to  Procedure(s): Pacemaker Implant (N/A) as a surgical intervention .  The patient's history has been reviewed, patient examined, no change in status, stable for surgery.  I have reviewed the patient's chart and labs.  Questions were answered to the patient's satisfaction.     Lewayne Bunting

## 2017-06-16 NOTE — Progress Notes (Signed)
 Progress Note  Patient Name: Andrew [name] Date of Encounter: 06/16/2017  Primary Cardiologist: Gollan  Subjective   Feels poorly, no chest pain or sob  Inpatient Medications    Scheduled Meds: . aspirin EC  81 mg Oral Daily  . atorvastatin  20 mg Oral q1800  . celecoxib  200 mg Oral BID  . cholecalciferol  1,000 Units Oral Daily  . clopidogrel  75 mg Oral Daily  . metFORMIN  500 mg Oral BID WC  . multivitamin with minerals  1 tablet Oral Daily  . sodium chloride flush  3 mL Intravenous Q12H   Continuous Infusions: . sodium chloride 250 mL (06/16/17 0600)   PRN Meds: sodium chloride, acetaminophen, HYDROcodone-acetaminophen, ondansetron (ZOFRAN) IV, sodium chloride flush   Vital Signs    Vitals:   06/16/17 0500 06/16/17 0600 06/16/17 0700 06/16/17 0800  BP:      Pulse: (!) 48 (!) 28 (!) 27 (!) 132  Resp: 15 14 13 16  Temp:   98.2 F (36.8 C)   TempSrc:   Oral   SpO2: 95% 97% 98% 98%  Weight:      Height:        Intake/Output Summary (Last 24 hours) at 06/16/17 1029 Last data filed at 06/16/17 0600  Gross per 24 hour  Intake           140.67 ml  Output             1000 ml  Net          -859.33 ml   Filed Weights   06/15/17 1500  Weight: 175 lb 0.7 oz (79.4 kg)    Telemetry    NSR with CHB - Personally Reviewed  ECG    NSR with CHB - Personally Reviewed  Physical Exam   GEN: elderly man, No acute distress.   Neck: 9 cm JVD Cardiac: Reg brady, no murmurs, rubs, or gallops.  Respiratory: Clear to auscultation bilaterally. GI: Soft, nontender, non-distended  MS: No edema; No deformity. Neuro:  Nonfocal  Psych: Normal affect   Labs    Chemistry Recent Labs Lab 06/15/17 1215 06/16/17 0546  NA 140 140  K 4.6 3.8  CL 107 109  CO2 23 21*  GLUCOSE 161* 136*  BUN 30* 25*  CREATININE 1.43* 1.29*  CALCIUM 9.4 8.7*  GFRNONAA 44* 50*  GFRAA 51* 58*  ANIONGAP 10 10     Hematology Recent Labs Lab 06/15/17 1215 06/16/17 0546  WBC 9.5  8.2  RBC 3.91* 3.91*  HGB 12.7* 12.7*  HCT 39.0 38.4*  MCV 99.7 98.2  MCH 32.5 32.5  MCHC 32.6 33.1  RDW 14.4 14.1  PLT 196 167    Cardiac EnzymesNo results for input(s): TROPONINI in the last 168 hours.  Recent Labs Lab 06/15/17 1218  TROPIPOC 0.00     BNPNo results for input(s): BNP, PROBNP in the last 168 hours.   DDimer No results for input(s): DDIMER in the last 168 hours.   Radiology    Dg Chest Portable 1 View  Result Date: 06/15/2017 CLINICAL DATA:  Bradycardia. History of hypertension and coronary artery disease. EXAM: PORTABLE CHEST 1 VIEW COMPARISON:  Radiographs 06/13/2013. FINDINGS: 1249 hour. The heart size and mediastinal contours are stable status post median sternotomy and CABG. The upper sternotomy wire is chronically fractured. There are calcified mediastinal and left hilar lymph nodes. The lungs appear unchanged with suspected mild basilar scarring. There is no edema, confluent airspace opacity, pleural effusion or   pneumothorax. No acute osseous findings are evident. IMPRESSION: Stable postoperative chest.  No acute cardiopulmonary process. Electronically Signed   By: William  Veazey M.D.   On: 06/15/2017 13:06    Cardiac Studies   none  Patient Profile     83 y.o. male admitted with CHB, now coreg washed out with persistent CHB.  Assessment & Plan    1. CHB - he remains profoundly bradycardic with HR's in the 20's. Will plan to proceed with PPM insertion. 2. HTN - once his PPM is in place, will plan to restart his beta blocker.  Signed, Gregg Taylor, MD  06/16/2017, 10:29 AM  [name], male   DOB: 02/13/1934, 83 y.o.   MRN: [id number]  

## 2017-06-16 NOTE — Progress Notes (Signed)
   Attempted to do echocardiogram but patient was going to the cath lab.     Andrew [name] 06/16/2017, 11:38 AM

## 2017-06-16 NOTE — Progress Notes (Signed)
CRITICAL VALUE ALERT  Critical Value: Rhythm Change to AFIB/ slow ventricular rate 20's to 40's  Date & Time Notified:  8/25 @ 0510  Provider Notified: cardiology fellow/ Pauley MD  Orders Received/Actions taken: No orders given

## 2017-06-17 ENCOUNTER — Inpatient Hospital Stay (HOSPITAL_COMMUNITY): Payer: Medicare Other

## 2017-06-17 ENCOUNTER — Encounter (HOSPITAL_COMMUNITY): Payer: Self-pay | Admitting: Physician Assistant

## 2017-06-17 NOTE — Discharge Summary (Signed)
Discharge Summary    [name],  MRN: [id number], DOB/AGE: 1934-03-15 83 y.o.  Admit date: 06/15/2017 Discharge date: 06/17/2017  Primary Care Provider: Pincus Sanes Primary Cardiologist: Dr. Mariah Milling / Dr. Ladona Ridgel   Discharge Diagnoses    Active Problems:   Complete heart block (HCC)   Allergies Allergies  Allergen Reactions  . Penicillins Other (See Comments)    Taste buds     History of Present Illness     Andrew [name] is a 83 y.o. male with a history of CAD s/p CABG, HTN, diabetes, and HLD who presented to UC on 06/15/17 with progressive fatigue, dyspnea on exertion and dizziness. He was found to be in CHB and transferred to [facility] where he was admitted by cardiology for Coreg 6.25mg  BID washout and observation.    Hospital Course     Consultants: none  He remained profoundly bradycardic with HR in 20s and feeling poorly despite BB washout. Dr. Ladona Ridgel successfully implanted a St. Jude PPM (serial number [id number]) on 06/16/17. Follow up CXR and interrogation today were unremarkable. He will be resumed on home Coreg at discharge. He has been instructed to hold Plavix for 5 days.    The patient has had an uncomplicated hospital course and is recovering well. He has been seen by Dr. Ladona Ridgel today and deemed ready for discharge home. A staff message for all follow up appointments has been sent.  Discharge medications are listed below.  _____________  Discharge Vitals Blood pressure (!) 174/85, pulse 66, temperature 97.8 F (36.6 C), resp. rate 14, height 5\' 8"  (1.727 m), weight 175 lb 0.7 oz (79.4 kg), SpO2 96 %.  Filed Weights   06/15/17 1500  Weight: 175 lb 0.7 oz (79.4 kg)    Labs & Radiologic Studies     CBC  Recent Labs  06/15/17 1215 06/16/17 0546  WBC 9.5 8.2  NEUTROABS 6.6  --   HGB 12.7* 12.7*  HCT 39.0 38.4*  MCV 99.7 98.2  PLT 196 167   Basic Metabolic Panel  Recent Labs  06/15/17 1215 06/16/17 0546  NA 140 140  K 4.6 3.8    CL 107 109  CO2 23 21*  GLUCOSE 161* 136*  BUN 30* 25*  CREATININE 1.43* 1.29*  CALCIUM 9.4 8.7*   Liver Function Tests No results for input(s): AST, ALT, ALKPHOS, BILITOT, PROT, ALBUMIN in the last 72 hours. No results for input(s): LIPASE, AMYLASE in the last 72 hours. Cardiac Enzymes No results for input(s): CKTOTAL, CKMB, CKMBINDEX, TROPONINI in the last 72 hours. BNP Invalid input(s): POCBNP D-Dimer No results for input(s): DDIMER in the last 72 hours. Hemoglobin A1C No results for input(s): HGBA1C in the last 72 hours. Fasting Lipid Panel No results for input(s): CHOL, HDL, LDLCALC, TRIG, CHOLHDL, LDLDIRECT in the last 72 hours. Thyroid Function Tests  Recent Labs  06/15/17 1215  TSH 1.087    Dg Chest Port 1 View  Result Date: 06/17/2017 CLINICAL DATA:  Pacemaker placement. EXAM: PORTABLE CHEST 1 VIEW COMPARISON:  06/15/2017. FINDINGS: Stable cardiac silhouette. Dual lead pacer in good position based on the single portable AP radiograph. No pneumothorax. No significant pulmonary opacity. IMPRESSION: Dual lead pacer in good position.  No pneumothorax. Electronically Signed   By: Elsie Stain M.D.   On: 06/17/2017 07:23   Dg Chest Portable 1 View  Result Date: 06/15/2017 CLINICAL DATA:  Bradycardia. History of hypertension and coronary artery disease. EXAM: PORTABLE CHEST 1 VIEW COMPARISON:  Radiographs  06/13/2013. FINDINGS: 1249 hour. The heart size and mediastinal contours are stable status post median sternotomy and CABG. The upper sternotomy wire is chronically fractured. There are calcified mediastinal and left hilar lymph nodes. The lungs appear unchanged with suspected mild basilar scarring. There is no edema, confluent airspace opacity, pleural effusion or pneumothorax. No acute osseous findings are evident. IMPRESSION: Stable postoperative chest.  No acute cardiopulmonary process. Electronically Signed   By: Carey Bullocks M.D.   On: 06/15/2017 13:06      Diagnostic Studies/Procedures    06/16/17 Procedures   Pacemaker Implant  Conclusion  CONCLUSIONS:   1. Successful implantation of a St. Jude dual-chamber pacemaker for symptomatic bradycardia due to complete heart block: St. Jude (serial number [id number]) pacemaker  2. No early apparent complications.           _____________    Disposition   Pt is being discharged home today in good condition.  Follow-up Plans & Appointments    Follow-up Information    Marinus Maw, MD Follow up.   Specialty:  Cardiology Why:  The office will call you to arrange a wound check in 10 days and follow up with Dr. Ladona Ridgel after that Contact information: [address] [place] [address] [phone]            Discharge Medications     Medication List    TAKE these medications   aspirin EC 81 MG tablet Take 81 mg by mouth daily.   atorvastatin 20 MG tablet Commonly known as:  LIPITOR Take 1 tablet (20 mg total) by mouth daily at 6 PM.   carvedilol 6.25 MG tablet Commonly known as:  COREG Take 1 tablet (6.25 mg total) by mouth 2 (two) times daily with a meal.   celecoxib 200 MG capsule Commonly known as:  CELEBREX Take 200 mg by mouth 2 (two) times daily.   cholecalciferol 1000 units tablet Commonly known as:  VITAMIN D Take 1,000 Units by mouth daily.   clopidogrel 75 MG tablet Commonly known as:  PLAVIX Take 1 tablet (75 mg total) by mouth daily. Notes to patient:  Hold 5 days until 06/22/17 then resume taking as normally    HYDROcodone-acetaminophen 5-325 MG tablet Commonly known as:  NORCO/VICODIN Take 1 tablet by mouth every 6 (six) hours as needed for moderate pain.   metFORMIN 500 MG 24 hr tablet Commonly known as:  GLUCOPHAGE-XR TAKE 1 TABLET TWICE A DAY What changed:  See the new instructions.   multivitamin with minerals Tabs tablet Take 1 tablet by mouth daily.   niacin 1000 MG CR tablet Commonly known as:   NIASPAN Take 1 tablet (1,000 mg total) by mouth at bedtime.         Outstanding Labs/Studies   none  Duration of Discharge Encounter   Greater than 30 minutes including physician time.  Signed, Cline Crock PA-C 06/17/2017, 12:33 PM  EP Attending  Patient seen and examined. Agree with above. The patient is stable after PPM insertion. His device has been interrogated under my direction and is working normally. His CXR is stable. Usual followup.  Leonia Reeves.D.

## 2017-06-17 NOTE — Progress Notes (Signed)
Progress Note  Patient Name: Andrew [name] Date of Encounter: 06/17/2017  Primary Cardiologist: Mariah Milling  Subjective   "I want to go home". No chest pain or sob.   Inpatient Medications    Scheduled Meds: . aspirin EC  81 mg Oral Daily  . atorvastatin  20 mg Oral q1800  . celecoxib  200 mg Oral BID  . cholecalciferol  1,000 Units Oral Daily  . metFORMIN  500 mg Oral BID WC  . multivitamin with minerals  1 tablet Oral Daily   Continuous Infusions:  PRN Meds: acetaminophen, acetaminophen, HYDROcodone-acetaminophen, ondansetron (ZOFRAN) IV, ondansetron (ZOFRAN) IV   Vital Signs    Vitals:   06/17/17 0400 06/17/17 0500 06/17/17 0600 06/17/17 0700  BP:   (!) 174/85   Pulse: 64 60 63   Resp: 14 14 14    Temp:   97.8 F (36.6 C) 97.7 F (36.5 C)  TempSrc:    Oral  SpO2: 97% 98% 98%   Weight:      Height:        Intake/Output Summary (Last 24 hours) at 06/17/17 0850 Last data filed at 06/17/17 0813  Gross per 24 hour  Intake             1120 ml  Output             1750 ml  Net             -630 ml   Filed Weights   06/15/17 1500  Weight: 175 lb 0.7 oz (79.4 kg)    Telemetry    P synchronous ventricular pacing - Personally Reviewed  ECG    P synchronous ventricular pacing - Personally Reviewed  Physical Exam   GEN: No acute distress.   Neck: 6 cm JVD Cardiac: RRR, no murmurs, rubs, or gallops.  Respiratory: Clear to auscultation bilaterally. Very small pocket hematoma GI: Soft, nontender, non-distended  MS: No edema; No deformity. Neuro:  Nonfocal  Psych: Normal affect   Labs    Chemistry Recent Labs Lab 06/15/17 1215 06/16/17 0546  NA 140 140  K 4.6 3.8  CL 107 109  CO2 23 21*  GLUCOSE 161* 136*  BUN 30* 25*  CREATININE 1.43* 1.29*  CALCIUM 9.4 8.7*  GFRNONAA 44* 50*  GFRAA 51* 58*  ANIONGAP 10 10     Hematology Recent Labs Lab 06/15/17 1215 06/16/17 0546  WBC 9.5 8.2  RBC 3.91* 3.91*  HGB 12.7* 12.7*  HCT 39.0 38.4*  MCV  99.7 98.2  MCH 32.5 32.5  MCHC 32.6 33.1  RDW 14.4 14.1  PLT 196 167    Cardiac EnzymesNo results for input(s): TROPONINI in the last 168 hours.  Recent Labs Lab 06/15/17 1218  TROPIPOC 0.00     BNPNo results for input(s): BNP, PROBNP in the last 168 hours.   DDimer No results for input(s): DDIMER in the last 168 hours.   Radiology    Dg Chest Port 1 View  Result Date: 06/17/2017 CLINICAL DATA:  Pacemaker placement. EXAM: PORTABLE CHEST 1 VIEW COMPARISON:  06/15/2017. FINDINGS: Stable cardiac silhouette. Dual lead pacer in good position based on the single portable AP radiograph. No pneumothorax. No significant pulmonary opacity. IMPRESSION: Dual lead pacer in good position.  No pneumothorax. Electronically Signed   By: Elsie Stain M.D.   On: 06/17/2017 07:23   Dg Chest Portable 1 View  Result Date: 06/15/2017 CLINICAL DATA:  Bradycardia. History of hypertension and coronary artery disease. EXAM: PORTABLE CHEST 1 VIEW  COMPARISON:  Radiographs 06/13/2013. FINDINGS: 1249 hour. The heart size and mediastinal contours are stable status post median sternotomy and CABG. The upper sternotomy wire is chronically fractured. There are calcified mediastinal and left hilar lymph nodes. The lungs appear unchanged with suspected mild basilar scarring. There is no edema, confluent airspace opacity, pleural effusion or pneumothorax. No acute osseous findings are evident. IMPRESSION: Stable postoperative chest.  No acute cardiopulmonary process. Electronically Signed   By: Carey Bullocks M.D.   On: 06/15/2017 13:06    Cardiac Studies   Echo - not performed  Patient Profile     83 y.o. male admitted with CHB, s/p urgent PPM insertion  Assessment & Plan    1. CHB - he is doing well,s /p PPM insertion.  2. PPM - his [employer] PPM has been interogated under my direction and is working normally. CXR looks good.  3. HTN - his blood pressure is up today. He is instructed to restart his coreg at  discharge. 4. Disp. - ambulate in halls and DC home with usual followup. I do not want him to take his Plavix for 5 days. He can continue low dose ASA. He   Signed, Lewayne Bunting, MD  06/17/2017, 8:50 AM  [name], male   DOB: 04/02/1934, 83 y.o.   MRN: [id number]

## 2017-06-17 NOTE — Discharge Instructions (Signed)
° ° °  Supplemental Discharge Instructions for  Pacemaker/Defibrillator Patients  Activity No heavy lifting or vigorous activity with your left/right arm for 6 to 8 weeks.  Do not raise your left/right arm above your head for one week.  Gradually raise your affected arm as drawn below.            8/27                            8/28                       8/29                           8/30 __  NO DRIVING for  1 week    ; you may begin driving on   06/24/41  .  WOUND CARE - Keep the wound area clean and dry.  Do not get this area wet for one week. No showers for one week; you may shower on  06/23/17   . - The tape/steri-strips on your wound will fall off; do not pull them off.  No bandage is needed on the site.  DO  NOT apply any creams, oils, or ointments to the wound area. - If you notice any drainage or discharge from the wound, any swelling or bruising at the site, or you develop a fever > 101? F after you are discharged home, call the office at once.  Special Instructions - You are still able to use cellular telephones; use the ear opposite the side where you have your pacemaker/defibrillator.  Avoid carrying your cellular phone near your device. - When traveling through airports, show security personnel your identification card to avoid being screened in the metal detectors.  Ask the security personnel to use the hand wand. - Avoid arc welding equipment, MRI testing (magnetic resonance imaging), TENS units (transcutaneous nerve stimulators).  Call the office for questions about other devices. - Avoid electrical appliances that are in poor condition or are not properly grounded. - Microwave ovens are safe to be near or to operate.  Additional information for defibrillator patients should your device go off: - If your device goes off ONCE and you feel fine afterward, notify the device clinic nurses. - If your device goes off ONCE and you do not feel well afterward, call 911. - If your device  goes off TWICE, call 911. - If your device goes off THREE times in one day, call 911.  DO NOT DRIVE YOURSELF OR A FAMILY MEMBER WITH A DEFIBRILLATOR TO THE HOSPITAL--CALL 911.

## 2017-06-18 ENCOUNTER — Telehealth: Payer: Self-pay | Admitting: *Deleted

## 2017-06-18 ENCOUNTER — Encounter (HOSPITAL_COMMUNITY): Payer: Self-pay | Admitting: Internal Medicine

## 2017-06-18 NOTE — Telephone Encounter (Signed)
Pt was on the TCM list admitted 06/15/17 for Complete heart block (HCC). On 06/16/17 had Pacemaker implant, and was D/C 06/17/17 and will be follow-up w/  Marinus Maw, MD Follow up.   Specialty:  Cardiology Why:  The office will call you to arrange a wound check in 10 days and follow up with Dr. Ladona Ridgel

## 2017-07-04 ENCOUNTER — Ambulatory Visit (INDEPENDENT_AMBULATORY_CARE_PROVIDER_SITE_OTHER): Payer: Medicare Other | Admitting: *Deleted

## 2017-07-04 DIAGNOSIS — I442 Atrioventricular block, complete: Secondary | ICD-10-CM

## 2017-07-04 DIAGNOSIS — Z950 Presence of cardiac pacemaker: Secondary | ICD-10-CM | POA: Diagnosis not present

## 2017-07-04 LAB — CUP PACEART INCLINIC DEVICE CHECK
Battery Voltage: 3.07 V
Brady Statistic RA Percent Paced: 11 %
Brady Statistic RV Percent Paced: 98 %
Implantable Lead Implant Date: 20180825
Implantable Lead Implant Date: 20180825
Lead Channel Impedance Value: 525 Ohm
Lead Channel Impedance Value: 850 Ohm
Lead Channel Pacing Threshold Amplitude: 0.5 V
Lead Channel Pacing Threshold Pulse Width: 0.5 ms
Lead Channel Pacing Threshold Pulse Width: 0.5 ms
Lead Channel Sensing Intrinsic Amplitude: 5 mV
Lead Channel Setting Pacing Amplitude: 3.5 V
Lead Channel Setting Sensing Sensitivity: 4 mV
MDC IDC LEAD LOCATION: 753859
MDC IDC LEAD LOCATION: 753860
MDC IDC MSMT BATTERY REMAINING LONGEVITY: 129 mo
MDC IDC MSMT LEADCHNL RV PACING THRESHOLD AMPLITUDE: 0.5 V
MDC IDC MSMT LEADCHNL RV SENSING INTR AMPL: 10 mV
MDC IDC PG IMPLANT DT: 20180825
MDC IDC SESS DTM: 20180912115312
MDC IDC SET LEADCHNL RV PACING AMPLITUDE: 0.75 V
MDC IDC SET LEADCHNL RV PACING PULSEWIDTH: 0.5 ms
Pulse Gen Serial Number: 8939923

## 2017-07-04 NOTE — Progress Notes (Signed)
Wound check appointment. Steri-strips previously removed by patient. Wound without redness or drainage, purple-yellow ecchymosis noted along L lateral border of pocket. Hematoma noted at site, moderately soft to palpation. Incision edges approximated, wound well healed. WC saw patient, recommended continuing ASA and Plavix, no pressure dressing at this time. Plan for wound recheck on 07/12/17 while GT in office. Patient agrees to call with any signs/symptoms of infection/bleeding in the interim.  Normal device function. Thresholds, sensing, and impedances consistent with implant measurements. Device programmed at 3.5V (RA) with auto capture programmed on (RV) for extra safety margin until 3 month visit. Histogram distribution appropriate for patient and level of activity. No mode switches or high ventricular rates noted. Patient educated about wound care, arm mobility, lifting restrictions. ROV with GT on 09/17/17.

## 2017-07-04 NOTE — Patient Instructions (Signed)
Please call 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

## 2017-07-12 ENCOUNTER — Ambulatory Visit (INDEPENDENT_AMBULATORY_CARE_PROVIDER_SITE_OTHER): Payer: Self-pay | Admitting: *Deleted

## 2017-07-12 ENCOUNTER — Encounter: Payer: Medicare Other | Admitting: Internal Medicine

## 2017-07-12 DIAGNOSIS — Z950 Presence of cardiac pacemaker: Secondary | ICD-10-CM

## 2017-07-12 DIAGNOSIS — I442 Atrioventricular block, complete: Secondary | ICD-10-CM

## 2017-07-12 NOTE — Progress Notes (Signed)
  Wound re-check consent given by pt for image of wound~ healing stages of bruising noted. Small hematoma~ per pt this resolving. Incision well healed. Pt instructed to call if swelling, fever, chills, or drainage.

## 2017-07-20 NOTE — Progress Notes (Addendum)
Cardiology Office Note  Date:  07/23/2017   ID:  Andrew [name], DOB November 03, 1933, MRN [id number]  PCP:  Pincus Sanes, MD   Chief Complaint  Patient presents with  . other    ED 06/15/2017 for complete heart Block 6 month f/u. Meds reviewed verbally with patient.     HPI:  Andrew [name] is a very pleasant 83 year old gentleman with history of  coronary artery disease,  bypass in May 2010, Chronic hip and back pain, can't walk hx of near syncope  hyperlipidemia,  diabetes,  hypertension,  complete heart block 06/15/2017, pacer who presents for follow-up of his coronary artery disease  Recent hospitalization for complete heart block 06/15/2017 Hospital records reviewed with the patient in detail Bradycardic heart rate in the 20 to 30s St. Jude pacemaker placed August 20 50,018, by Dr. Ladona Ridgel He has numerous questions about his pacemaker, restrictions  Following his hospitalization, Approximate 1 week ago,Fell forward , pulled muscle On prior office visit, he was Lightheaded in AM,  Walking to the mailbox Left leg swelling,pulled a muscle  Indicates that he will need to have shots in his back Discussed whether he still needs Plavix  Lab work reviewed hemoglobin A1c 6.6, total cholesterol 152 BUN elevated, normal creatinine  EKG personally reviewed by myself on todays visit Paced rhythm rate 77 bpm  Other past medical history reviewed Stress test: 12/27/2016: no ischemia Results reviewed with him in detail  Event monitor 12/21/2016:  Results reviewed with him in detail Normal sinus rhythm Min HR of 43 bpm, max HR of 117 bpm, and avg HR of 72 bpm.  Rare APCs and PVCs likely causing symptoms No other significnat arrhythmia  History of a pinched nerve, back surgery 06/19/2013. He continues to have back pain, status post radiofrequency treatments x3 through [facility]. Biggest complaint is arthritis in his hips, back, knees. He reports that he has tried  Cialis and Viagra in the past for erectile dysfunction. This did not work for him  Previous total cholesterol 141, LDL 67   PMH:   has a past medical history of Colonic polyp (2011 last colo); Coronary artery disease (02/2009); DEGENERATIVE JOINT DISEASE; Diverticulosis of colon; DIVERTICULOSIS, COLON; HYPERLIPIDEMIA-MIXED; HYPERTENSION, BENIGN; LBP (low back pain); Pacemaker; Symptomatic bradycardia; Type II or unspecified type diabetes mellitus without mention of complication, not stated as uncontrolled; and VITAMIN D DEFICIENCY.  PSH:    Past Surgical History:  Procedure Laterality Date  . CARPAL TUNNEL RELEASE Right   . CATARACT EXTRACTION, BILATERAL     R 07/07/13, L 07/21/13  . CORONARY ARTERY BYPASS GRAFT  03/17/09   x 4  . INGUINAL HERNIA REPAIR  1980  . KNEE ARTHROSCOPY  2007   right  . LUMBAR EPIDURAL INJECTION  2013   Has as needed.  . LUMBAR LAMINECTOMY/DECOMPRESSION MICRODISCECTOMY N/A 06/19/2013   Procedure: LUMBAR LAMINECTOMY/DECOMPRESSION MICRODISCECTOMY;  Surgeon: Emilee Hero, MD;  Location: [facility] OR;  Service: Orthopedics;  Laterality: N/A;  Lumbar 4-5 decompression  . PACEMAKER IMPLANT N/A 06/16/2017   Procedure: Pacemaker Implant;  Surgeon: Marinus Maw, MD;  Location: [facility] INVASIVE CV LAB;  Service: Cardiovascular;  Laterality: N/A;  . TRIGGER FINGER RELEASE Right     Current Outpatient Prescriptions  Medication Sig Dispense Refill  . aspirin EC 81 MG tablet Take 81 mg by mouth daily.    Marland Kitchen atorvastatin (LIPITOR) 20 MG tablet Take 1 tablet (20 mg total) by mouth daily at 6 PM. 90 tablet 3  . carvedilol (COREG)  6.25 MG tablet Take 1 tablet (6.25 mg total) by mouth 2 (two) times daily with a meal. 180 tablet 3  . celecoxib (CELEBREX) 200 MG capsule Take 200 mg by mouth 2 (two) times daily.    . cholecalciferol (VITAMIN D) 1000 UNITS tablet Take 1,000 Units by mouth daily.    . clopidogrel (PLAVIX) 75 MG tablet Take 1 tablet (75 mg total) by mouth daily. 90  tablet 3  . HYDROcodone-acetaminophen (NORCO/VICODIN) 5-325 MG per tablet Take 1 tablet by mouth every 6 (six) hours as needed for moderate pain.     . metFORMIN (GLUCOPHAGE-XR) 500 MG 24 hr tablet TAKE 1 TABLET TWICE A DAY 180 tablet 2  . Multiple Vitamin (MULTIVITAMIN WITH MINERALS) TABS tablet Take 1 tablet by mouth daily.    . niacin (NIASPAN) 1000 MG CR tablet Take 1 tablet (1,000 mg total) by mouth at bedtime. 90 tablet 3   No current facility-administered medications for this visit.      Allergies:   Penicillins   Social History:  The patient  reports that he has never smoked. He has never used smokeless tobacco. He reports that he drinks alcohol. He reports that he does not use drugs.   Family History:   family history includes Diabetes in his other; Heart failure in his father and mother.    Review of Systems: Review of Systems  Constitutional: Positive for malaise/fatigue.  Respiratory: Negative.   Cardiovascular: Negative.   Gastrointestinal: Negative.   Musculoskeletal: Negative.        Left leg swelling and pain  Neurological: Negative.   Psychiatric/Behavioral: Negative.   All other systems reviewed and are negative.    PHYSICAL EXAM: VS:  BP 118/66 (BP Location: Left Arm, Patient Position: Sitting, Cuff Size: Normal)   Pulse 77   Ht  (1.727 m)   Wt 181 lb 12 oz (82.4 kg)   BMI 27.63 kg/m  , BMI Body mass index is 27.63 kg/m. GEN: Well nourished, well developed, in no acute distress  HEENT: normal  Neck: no JVD, carotid bruits, or masses Cardiac: RRR; no murmurs, rubs, or gallops,no edema  Respiratory:  clear to auscultation bilaterally, normal work of breathing GI: soft, nontender, nondistended, + BS MS: no deformity or atrophy  Skin: warm and dry, no rash Neuro:  Strength and sensation are intact Psych: euthymic mood, full affect    Recent Labs: 03/26/2017: ALT 19 06/15/2017: TSH 1.087 06/16/2017: BUN 25; Creatinine, Ser 1.29; Hemoglobin 12.7;  Platelets 167; Potassium 3.8; Sodium 140    Lipid Panel Lab Results  Component Value Date   CHOL 157 03/26/2017   HDL 53.50 03/26/2017   LDLCALC 73 03/26/2017   TRIG 152.0 (H) 03/26/2017      Wt Readings from Last 3 Encounters:  07/23/17 181 lb 12 oz (82.4 kg)  06/15/17 175 lb 0.7 oz (79.4 kg)  03/26/17 180 lb (81.6 kg)       ASSESSMENT AND PLAN:  Pure hypercholesterolemia -  Cholesterol is at goal on the current lipid regimen. No changes to the medications were made.  Left leg pain Presented after a fall, suspect musculoskeletal injury given the timing.  Extends from ankle up leg, discomfort with walking, reports slowly getting better,  Slowly improving, recommended icing. 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 Low carbohydrates  Near syncope No recent episodes of orthostasis  Complete heart block,pacemaker Tried to answer all of his questions. He will follow-up with Dr. Ladona Ridgel   Total encounter time more than 25 minutes  Greater than 50% was spent in counseling and coordination of care with the patient  Follow-up 12 months   Signed, Dossie Arbour, M.D., Ph.D. 07/23/2017  [facility] [place], [place] [phone]

## 2017-07-23 ENCOUNTER — Ambulatory Visit (INDEPENDENT_AMBULATORY_CARE_PROVIDER_SITE_OTHER): Payer: Medicare Other | Admitting: Cardiovascular Disease

## 2017-07-23 ENCOUNTER — Encounter: Payer: Self-pay | Admitting: Cardiovascular Disease

## 2017-07-23 VITALS — BP 118/66 | HR 77 | Ht 68.0 in | Wt 181.8 lb

## 2017-07-23 DIAGNOSIS — E1149 Type 2 diabetes mellitus with other diabetic neurological complication: Secondary | ICD-10-CM

## 2017-07-23 DIAGNOSIS — I25708 Atherosclerosis of coronary artery bypass graft(s), unspecified, with other forms of angina pectoris: Secondary | ICD-10-CM | POA: Diagnosis not present

## 2017-07-23 DIAGNOSIS — E782 Mixed hyperlipidemia: Secondary | ICD-10-CM | POA: Diagnosis not present

## 2017-07-23 DIAGNOSIS — I10 Essential (primary) hypertension: Secondary | ICD-10-CM | POA: Diagnosis not present

## 2017-07-23 DIAGNOSIS — I442 Atrioventricular block, complete: Secondary | ICD-10-CM

## 2017-07-23 NOTE — Patient Instructions (Addendum)

## 2017-07-24 ENCOUNTER — Other Ambulatory Visit (HOSPITAL_COMMUNITY): Payer: Self-pay | Admitting: Orthopedic Surgery

## 2017-07-24 ENCOUNTER — Ambulatory Visit (HOSPITAL_COMMUNITY)
Admission: RE | Admit: 2017-07-24 | Discharge: 2017-07-24 | Disposition: A | Discharge status: Home / Self Care | Payer: Medicare Other | Source: Ambulatory Visit | Attending: Cardiovascular Disease | Admitting: Cardiovascular Disease

## 2017-07-24 ENCOUNTER — Telehealth: Payer: Self-pay | Admitting: Cardiology

## 2017-07-24 DIAGNOSIS — I824Y2 Acute embolism and thrombosis of unspecified deep veins of left proximal lower extremity: Secondary | ICD-10-CM | POA: Diagnosis not present

## 2017-07-24 DIAGNOSIS — M79605 Pain in left leg: Secondary | ICD-10-CM

## 2017-07-24 NOTE — Telephone Encounter (Signed)
Alerted by Estanislado Spire, PA that patient was + for DVT. He was started on Xarelto 15 mg BID. Spoke with Dr. Eden Emms, will call patient and have him stop Plavix, continue ASA. Called patient as well to alert him of this. He verbalized understanding.   Little Ishikawa, NP-C 6:14 PM

## 2017-07-24 NOTE — Telephone Encounter (Signed)
Can we call patient, make sure he has his xarelto We may have samples, I am waiting for final report. DVT was unexpected. I had highly suspected ligamental injury given the fall and timing of swelling.

## 2017-07-25 ENCOUNTER — Telehealth: Payer: Self-pay | Admitting: Cardiovascular Disease

## 2017-07-25 MED ORDER — RIVAROXABAN 15 MG PO TABS: 15.0000 mg | ORAL_TABLET | Freq: Two times a day (BID) | ORAL | 0 refills | Status: DC

## 2017-07-25 NOTE — Addendum Note (Signed)
Addended by: Bryna Colander on: 07/25/2017 09:16 AM   Modules accepted: Orders

## 2017-07-25 NOTE — Telephone Encounter (Signed)
Patient has new rx for xarelto and wife was told Pam would send over a discount card.    Please call to discuss.

## 2017-07-25 NOTE — Telephone Encounter (Signed)
Medication Samples have been provided to the patient.  Drug name: Xarelto       Strength: 15 mg        Qty: 2 bottles  LOT: [id number]  Exp.Date: 12/20  Samples placed up front for patient to pick up along with free 30 day card and application for assistance.

## 2017-07-25 NOTE — Telephone Encounter (Signed)
Spoke with patients wife per release form and made her aware that discount card was in the bag with the samples she picked up. She verbalized understanding and confirmed that she does have card in hand. Instructed her to take that with her to the pharmacy when she goes in to pick up the prescription. She verbalized understanding with no further questions at this time.

## 2017-07-25 NOTE — Telephone Encounter (Addendum)
Spoke with patient and reviewed all instructions and he verbalized understanding. He then requested that I speak with his wife as well. Reviewed all instructions with her and let her know that I would leave some samples up front, 30 day free card, and application for assistance with further prescriptions. Sent in 30 day prescription and she is going to check on the assistance program. She verbalized understanding of our conversation, agreement with plan, and had no further questions at this time.

## 2017-07-27 NOTE — Telephone Encounter (Signed)
Spoke with patients wife per release form and she reports that she has completed the paperwork for assistance and will mail it out tomorrow. Let her know that I would go ahead and fax over our portion and to give [place] a call back if she has any questions or concerns. Placed provider portion of paperwork in the "Faxed" bin above Hollister Wessler's desk.

## 2017-07-27 NOTE — Telephone Encounter (Signed)
Spoke with patients wife per release form. She has assistance program forms but still needs to get them completed. Placed completed physician forms in "Green" folder in "To Do" bin on Jezabelle Chisolm's desk. Patients wife also wanted to know when they would need a repeat study to see if the blood clot has gone away. I do not see any recommendations mentioned in chart or results. Let her know that I would check with Dr. Mariah Milling and then give her a call back with his recommendations. She verbalized understanding with no further questions at this time.

## 2017-08-07 ENCOUNTER — Telehealth: Payer: Self-pay | Admitting: Cardiovascular Disease

## 2017-08-07 NOTE — Telephone Encounter (Signed)
Patient has multiple issues and questions about care plan .  Unable to get specific details patient wife just wants a care plan re: ? Blood clot and activity

## 2017-08-07 NOTE — Telephone Encounter (Signed)
Spoke with patients wife and she wanted to know about activity restrictions. Advised that he resume activities slowly and that if he should have any pain then he would need to slow down a little. Reviewed with her that he should make sure to not miss any doses of his xarelto. She verbalized understanding of our conversation, agreement with plan, and had no further questions or concerns at this time.

## 2017-08-22 ENCOUNTER — Telehealth: Payer: Self-pay | Admitting: Cardiovascular Disease

## 2017-08-22 NOTE — Telephone Encounter (Signed)
Spoke with patients wife per release form. Reviewed with patient that assistance program will send them a response. She verbalized understanding and wanted to see if we had some samples available. Medication Samples have been placed up front for them to pick up.  Drug name: Xarelto       Strength: 15 mg        Qty: 2 bottles  LOT: [id number]  Exp.Date: 12/20

## 2017-08-22 NOTE — Telephone Encounter (Signed)
Pt wife calling stating they have not heard anything from the Xarelto people Pt takes two tablets a day and only has 7 pills left Would like some advise on this  Please call back

## 2017-08-24 ENCOUNTER — Telehealth: Payer: Self-pay | Admitting: Cardiovascular Disease

## 2017-08-24 NOTE — Telephone Encounter (Signed)
Received a letter from pharmaceutical company rejecting application of Xarelto Wants to know how to proceed Please call to advise

## 2017-08-24 NOTE — Telephone Encounter (Signed)
Spoke with patients wife per release and she states that patient was not able to get assistance due to income. Reviewed with her that if they are unable to afford this medication then she would need to check with the prescribing physician for further options available. She states that this was ordered by the leg physician. Instructed her to please let us know if she has any further questions.

## 2017-08-24 NOTE — Telephone Encounter (Signed)
Fax received from patient assistance program and patient was denied for assistance with Xarelto.

## 2017-08-27 ENCOUNTER — Telehealth: Payer: Self-pay | Admitting: Cardiovascular Disease

## 2017-08-27 DIAGNOSIS — I82409 Acute embolism and thrombosis of unspecified deep veins of unspecified lower extremity: Secondary | ICD-10-CM

## 2017-08-27 DIAGNOSIS — Z5181 Encounter for therapeutic drug level monitoring: Secondary | ICD-10-CM

## 2017-08-27 NOTE — Telephone Encounter (Signed)
Spoke with patient and he was denied patient assistance for his xarelto and patient states that insurance is not going to cover this. He has been provided with some samples and wants to know what other type of medication can he take due to this one being so expensive. Made him aware that I would route this to Dr. Mariah MillingGollan for review. He verbalized understanding and had no further questions at this time.

## 2017-08-27 NOTE — Telephone Encounter (Signed)
Pt calling stating his insurance is denying to pay for his medication on  Xarelto  Would like some advise on another blood thinner  Please advise.

## 2017-08-28 DIAGNOSIS — Z86718 Personal history of other venous thrombosis and embolism: Secondary | ICD-10-CM | POA: Insufficient documentation

## 2017-08-28 DIAGNOSIS — I82409 Acute embolism and thrombosis of unspecified deep veins of unspecified lower extremity: Secondary | ICD-10-CM | POA: Insufficient documentation

## 2017-08-28 NOTE — Telephone Encounter (Signed)
Spoke with patient and reviewed Dr. Windell HummingbirdGollan's recommendations. He wants to move forward with switching to coumadin. Advised that he would need to come in for PT/INR checks on a regular basis and as a new patient they would want to review some information with him. He verbalized understanding and wanted to go ahead and get started with that. Reviewed that I would route this to our coumadin clinic for assistance with scheduling new start appointment. Order placed for repeat ultrasound in 3 months and will make Dr. Mariah MillingGollan aware of his wishes to switch over. He was appreciative for the call and had no further questions at this time. He currently has about a weeks worth of xarelto left and will call him back with further instructions.

## 2017-08-28 NOTE — Telephone Encounter (Signed)
He will likely need 3 months of anticoagulation minimum Ultrasound to evaluate DVT 3 months For persistent thrombus could continue up to 6 months Up to him whether he would like to change to warfarin

## 2017-08-29 MED ORDER — WARFARIN SODIUM 5 MG PO TABS: 5.0000 mg | ORAL_TABLET | ORAL | 0 refills | Status: DC

## 2017-08-29 NOTE — Addendum Note (Signed)
Addended by: Rhea BeltonMOODY, Arben Packman R on: 08/29/2017 03:02 PM   Modules accepted: Orders

## 2017-08-29 NOTE — Telephone Encounter (Addendum)
Spoke w/ pt.  He has approximately 8 days worth of Xarelto left. Advised pt that I am sending rx for warfarin 5 mg to [facility] on [address] [place], for him to finish his remaining Xarelto (on approximately 11/15) and start warfarin at the time that his next dose of Xarelto would be due on 11/16. He is scheduled as a new pt in the coumadin clinic in 09/12/17 @ 2:30 for INR check and warfarin education.  He is appreciative and will call back w/ any questions or concerns prior to his appt.

## 2017-09-03 ENCOUNTER — Encounter: Payer: Self-pay | Admitting: Internal Medicine

## 2017-09-11 LAB — HM DIABETES EYE EXAM

## 2017-09-12 ENCOUNTER — Ambulatory Visit (INDEPENDENT_AMBULATORY_CARE_PROVIDER_SITE_OTHER): Payer: Medicare Other

## 2017-09-12 DIAGNOSIS — I82409 Acute embolism and thrombosis of unspecified deep veins of unspecified lower extremity: Secondary | ICD-10-CM

## 2017-09-12 DIAGNOSIS — Z5181 Encounter for therapeutic drug level monitoring: Secondary | ICD-10-CM

## 2017-09-12 LAB — POCT INR: INR: 2.6

## 2017-09-12 MED ORDER — WARFARIN SODIUM 5 MG PO TABS: 5.0000 mg | ORAL_TABLET | ORAL | 0 refills | Status: DC

## 2017-09-12 NOTE — Patient Instructions (Signed)
Please continue taking 1 tablet every day. Recheck INR in 2 weeks.

## 2017-09-17 ENCOUNTER — Encounter: Payer: Self-pay | Admitting: Internal Medicine

## 2017-09-17 ENCOUNTER — Ambulatory Visit (INDEPENDENT_AMBULATORY_CARE_PROVIDER_SITE_OTHER): Payer: Medicare Other | Admitting: Internal Medicine

## 2017-09-17 VITALS — BP 132/84 | HR 66 | Ht 68.0 in | Wt 188.2 lb

## 2017-09-17 DIAGNOSIS — Z950 Presence of cardiac pacemaker: Secondary | ICD-10-CM | POA: Diagnosis not present

## 2017-09-17 DIAGNOSIS — I442 Atrioventricular block, complete: Secondary | ICD-10-CM

## 2017-09-17 LAB — CUP PACEART INCLINIC DEVICE CHECK
Implantable Lead Implant Date: 20180825
Implantable Lead Location: 753859
Implantable Pulse Generator Implant Date: 20180825
Lead Channel Impedance Value: 862.5 Ohm
Lead Channel Pacing Threshold Amplitude: 0.5 V
Lead Channel Pacing Threshold Pulse Width: 0.5 ms
Lead Channel Sensing Intrinsic Amplitude: 9.2 mV
Lead Channel Setting Pacing Amplitude: 0.625
Lead Channel Setting Pacing Amplitude: 2 V
Lead Channel Setting Sensing Sensitivity: 4 mV
MDC IDC LEAD IMPLANT DT: 20180825
MDC IDC LEAD LOCATION: 753860
MDC IDC MSMT BATTERY REMAINING LONGEVITY: 138 mo
MDC IDC MSMT BATTERY VOLTAGE: 3.02 V
MDC IDC MSMT LEADCHNL RA IMPEDANCE VALUE: 525 Ohm
MDC IDC MSMT LEADCHNL RA PACING THRESHOLD AMPLITUDE: 0.5 V
MDC IDC MSMT LEADCHNL RA PACING THRESHOLD PULSEWIDTH: 0.5 ms
MDC IDC MSMT LEADCHNL RA SENSING INTR AMPL: 5 mV
MDC IDC SESS DTM: 20181126093841
MDC IDC SET LEADCHNL RV PACING PULSEWIDTH: 0.5 ms
MDC IDC STAT BRADY RA PERCENT PACED: 6.8 %
MDC IDC STAT BRADY RV PERCENT PACED: 99.08 %
Pulse Gen Model: 2272
Pulse Gen Serial Number: 8939923

## 2017-09-17 NOTE — Patient Instructions (Addendum)
Medication Instructions:  Your physician recommends that you continue on your current medications as directed. Please refer to the Current Medication list given to you today.  Labwork: None ordered.  Testing/Procedures: None ordered.  Follow-Up: Your physician wants you to follow-up in: 9 months with Dr. Ladona Ridgelaylor.   You will receive a reminder letter in the mail two months in advance. If you don't receive a letter, please call our office to schedule the follow-up appointment.  Remote monitoring is used to monitor your Pacemaker from home. This monitoring reduces the number of office visits required to check your device to one time per year. It allows us to keep an eye on the functioning of your device to ensure it is working properly. You are scheduled for a device check from home on 12/17/2017. You may send your transmission at any time that day. If you have a wireless device, the transmission will be sent automatically. After your physician reviews your transmission, you will receive a postcard with your next transmission date.  Any Other Special Instructions Will Be Listed Below (If Applicable).  If you need a refill on your cardiac medications before your next appointment, please call your pharmacy.

## 2017-09-17 NOTE — Progress Notes (Signed)
HPI [name] returns today for ongoing evaluation and management of complete heart block status post pacemaker insertion. He is a very pleasant 83 year old man with a history of coronary artery disease status post bypass surgery, hypertension, diabetes, and dyslipidemia. He was found to be severely fatigued and dizzy and was noted to be in complete heart block and underwent permanent pacemaker insertion approximately 3 months ago. He has done well in the interim. His symptoms have resolved. Allergies  Allergen Reactions  . Penicillins Other (See Comments)    Taste buds     Current Outpatient Medications  Medication Sig Dispense Refill  . aspirin EC 81 MG tablet Take 81 mg by mouth daily.    Marland Kitchen. atorvastatin (LIPITOR) 20 MG tablet Take 1 tablet (20 mg total) by mouth daily at 6 PM. 90 tablet 3  . carvedilol (COREG) 6.25 MG tablet Take 1 tablet (6.25 mg total) by mouth 2 (two) times daily with a meal. 180 tablet 3  . celecoxib (CELEBREX) 200 MG capsule Take 200 mg by mouth 2 (two) times daily.    . cholecalciferol (VITAMIN D) 1000 UNITS tablet Take 1,000 Units by mouth daily.    Marland Kitchen. HYDROcodone-acetaminophen (NORCO/VICODIN) 5-325 MG per tablet Take 1 tablet by mouth every 6 (six) hours as needed for moderate pain.     . metFORMIN (GLUCOPHAGE-XR) 500 MG 24 hr tablet TAKE 1 TABLET TWICE A DAY 180 tablet 2  . Multiple Vitamin (MULTIVITAMIN WITH MINERALS) TABS tablet Take 1 tablet by mouth daily.    . niacin (NIASPAN) 1000 MG CR tablet Take 1 tablet (1,000 mg total) by mouth at bedtime. 90 tablet 3  . warfarin (COUMADIN) 5 MG tablet Take 1 tablet (5 mg total) by mouth as directed. Take as directed by the coumadin clinic. 30 tablet 0   No current facility-administered medications for this visit.      Past Medical History:  Diagnosis Date  . Colonic polyp 2011 last colo   colo q 6540yr - [facility]   . Coronary artery disease 02/2009   severe left main and three-vessel, CABG  . DEGENERATIVE  JOINT DISEASE   . Diverticulosis of colon   . DIVERTICULOSIS, COLON   . HYPERLIPIDEMIA-MIXED   . HYPERTENSION, BENIGN   . LBP (low back pain)    s/p decompression 8/14; ESI - [place]; RFA x 3 early 2015  . Pacemaker    a. 05/2017: St. Jude (serial number [id number]) pacemaker  . Symptomatic bradycardia    a. s/p St. Jude (serial number [id number]) pacemaker 06/16/17  . Type II or unspecified type diabetes mellitus without mention of complication, not stated as uncontrolled   . VITAMIN D DEFICIENCY     ROS:   All systems reviewed and negative except as noted in the HPI.   Past Surgical History:  Procedure Laterality Date  . CARPAL TUNNEL RELEASE Right   . CATARACT EXTRACTION, BILATERAL     R 07/07/13, L 07/21/13  . CORONARY ARTERY BYPASS GRAFT  03/17/09   x 4  . INGUINAL HERNIA REPAIR  1980  . KNEE ARTHROSCOPY  2007   right  . LUMBAR EPIDURAL INJECTION  2013   Has as needed.  . LUMBAR LAMINECTOMY/DECOMPRESSION MICRODISCECTOMY N/A 06/19/2013   Procedure: LUMBAR LAMINECTOMY/DECOMPRESSION MICRODISCECTOMY;  Surgeon: Emilee HeroMark Leonard Dumonski, MD;  [facility];  Service: Orthopedics;  Laterality: N/A;  Lumbar 4-5 decompression  . PACEMAKER IMPLANT N/A 06/16/2017   Procedure: Pacemaker Implant;  Surgeon: Marinus Mawaylor, Dequane Strahan W, MD;  Location: [facility] INVASIVE CV LAB;  Service: Cardiovascular;  Laterality: N/A;  . TRIGGER FINGER RELEASE Right      Family History  Problem Relation Age of Onset  . Heart failure Mother        died age 81  . Heart failure Father        died age 81  . Diabetes Other   . Heart disease Neg Hx      Social History   Socioeconomic History  . Marital status: Married    Spouse name: Not on file  . Number of children: Not on file  . Years of education: Not on file  . Highest education level: Not on file  Social Needs  . Financial resource strain: Not on file  . Food insecurity - worry: Not on file  . Food insecurity - inability: Not on file  . Transportation needs -  medical: Not on file  . Transportation needs - non-medical: Not on file  Occupational History  . Not on file  Tobacco Use  . Smoking status: Never Smoker  . Smokeless tobacco: Never Used  . Tobacco comment: Married, 3 grown children. Retired 04/2002 from [employer] work  Substance and Sexual Activity  . Alcohol use: Yes    Comment: RARE  . Drug use: No  . Sexual activity: Not on file  Other Topics Concern  . Not on file  Social History Narrative  . Not on file     BP 132/84   Pulse 66   Ht 5\' 8"  (1.727 m)   Wt 188 lb 3.2 oz (85.4 kg)   SpO2 96%   BMI 28.62 kg/m   Physical Exam:  Well appearing 83 year old man, NAD HEENT: Unremarkable Neck:  6 cm JVD, no thyromegally Lymphatics:  No adenopathy Back:  No CVA tenderness Lungs:  Clear, with no wheezes, rales, or rhonchi HEART:  Regular rate rhythm, no murmurs, no rubs, no clicks Abd:  soft, positive bowel sounds, no organomegally, no rebound, no guarding Ext:  2 plus pulses, no edema, no cyanosis, no clubbing Skin:  No rashes no nodules Neuro:  CN II through XII intact, motor grossly intact  EKG - normal sinus rhythm with AV sequential pacing  DEVICE  Normal device function.  See PaceArt for details.   Assess/Plan: 1. Complete heart block - he is now asymptomatic status post pacemaker insertion 2. Pacemaker - his St. Jude dual-chamber pacemaker is working normally with good pacing and sensing thresholds. 3. Hypertension - his blood pressure is recently well controlled today. No change in medical therapy. He is encouraged to increase his physical activity. 4. Coronary artery disease - the patient is status post bypass surgery and denies chest pain or shortness of breath with exertion. He will undergo watchful waiting. No change in medical therapy.  Andrew [name], M.D.

## 2017-09-20 ENCOUNTER — Encounter: Payer: Self-pay | Admitting: Internal Medicine

## 2017-09-20 ENCOUNTER — Telehealth: Payer: Self-pay

## 2017-09-20 NOTE — Telephone Encounter (Signed)
   [facility] Pre-operative Risk Assessment    Request for surgical clearance: LOV GT=09-17-17  1. What type of surgery is being performed? LUMBAR FACET INJECTION, BILAT L5-S-1   2. When is this surgery scheduled? NOT LISTED    3. Are there any medications that need to be held prior to surgery and how long?WARFARIN-NO TIME LISTED ALSO PT MUST HAVE INR 1 DAY PRIOR TO INJECTION   4. Practice name and name of physician performing surgery? [facility]   5. What is your office phone and fax number? [place] ATTN:XRAY   6. Anesthesia type (None, local, MAC, general) ? NOT LISTED   Andrew [name] 09/20/2017, 12:46 PM  _________________________________________________________________   (provider comments below)

## 2017-09-21 NOTE — Telephone Encounter (Signed)
    Chart reviewed as part of pre-operative protocol coverage.  We are being asked to interrupt warfarin - patient had recent DVT diagnosed 07/24/17 so this will need to be discussed with primary cardiologist. Will route to Dr. Mariah MillingGollan for input. Dr. Mariah MillingGollan, please route reply to P CV DIV PREOP.   Laurann Montanaayna N Dunn, PA-C  09/21/2017, 4:47 PM

## 2017-09-24 NOTE — Telephone Encounter (Signed)
How long would he be able to wait Ideally would like longer period of time on anticoagulation However his symptoms if we do not go ahead with the shot right away?

## 2017-09-25 NOTE — Telephone Encounter (Signed)
   Chart reviewed as part of pre-operative protocol coverage.   Per Dr. Mariah MillingGollan, ideally he would like the patient to be on anticoagulation longer without interruption given recent DVT diagnosed in October 2018.  Anecdotally, typically patients are on anticoagulation for 3-6 months before this is interrupted. He is only 2 months out from diagnosis.  Pre-op Callback staff -   1) Please let patient know Dr. Mariah MillingGollan wants to find out how long he would be able to wait for injection, as he would like the patient to be on anticoagulation for longer period of time before it is stopped for any procedures. If the patient feels this injection is absolutely imperative, the patient should discuss risk of interruption with his surgeon (versus alternative therapies) and then get back with Dr. Mariah MillingGollan about if he absolutely needs to proceed.  2) Please inform surgeon's office that Dr. Mariah MillingGollan has recommended he be on anticoagulation for longer period of time before interrupting, given recent DVT. If it is felt that the procedure is inevitable, this should be conveyed to Dr. Mariah MillingGollan specifically for further input instead of pre-op pool.  3) Please schedule patient for f/u appt in January 2019 to discuss duration of anticoagulation with Dr. Mariah MillingGollan.  Will route this bundled recommendation to requesting provider via Epic fax function. Please call with questions.  Laurann Montanaayna N Rian Busche, PA-C 09/25/2017, 1:55 PM

## 2017-09-25 NOTE — Telephone Encounter (Signed)
Spoke with patient and informed him it would be ideal for him to wait for injection as he is only 2 months out from DVT. Informed him he will need to talk with Delbert HarnessMurphy Wainer about risks/benefits and perhaps come up with an alternative treatment until it is safe for him to have a lapse in anticoagulation. Informed him Delbert HarnessMurphy Wainer would be contacted to discuss and it would be requested they call him back with a plan.  Called Delbert HarnessMurphy Wainer - left message to call back.

## 2017-09-26 ENCOUNTER — Other Ambulatory Visit: Payer: Self-pay | Admitting: *Deleted

## 2017-09-26 ENCOUNTER — Ambulatory Visit (INDEPENDENT_AMBULATORY_CARE_PROVIDER_SITE_OTHER): Payer: Medicare Other

## 2017-09-26 DIAGNOSIS — I82409 Acute embolism and thrombosis of unspecified deep veins of unspecified lower extremity: Secondary | ICD-10-CM

## 2017-09-26 DIAGNOSIS — Z5181 Encounter for therapeutic drug level monitoring: Secondary | ICD-10-CM

## 2017-09-26 LAB — POCT INR: INR: 6.7

## 2017-09-26 NOTE — Patient Instructions (Addendum)
Please skip coumadin until Saturday, then start taking 1 tablet daily except 1/2 tablet on Mondays, Wednesdays and Fridays. Recheck INR in 2 weeks.

## 2017-09-26 NOTE — Progress Notes (Signed)
Subjective:   Andrew [name] is a 83 y.o. male who presents for Medicare Annual/Subsequent preventive examination.  Review of Systems:  No ROS.  Medicare Wellness Visit. Additional risk factors are reflected in the social history.  Cardiac Risk Factors include: advanced age (>4755men, 3>65 women);diabetes mellitus;dyslipidemia;hypertension;male gender Sleep patterns: gets up 1-2 times nightly to void and sleeps 6-7 hours nightly.    Home Safety/Smoke Alarms: Feels safe in home. Smoke alarms in place.  Living environment; residence and Firearm Safety: 2-story house, no firearms.Lives with wife, no needs for DME, good support system Seat Belt Safety/Bike Helmet: Wears seat belt.     Objective:    Vitals: BP 124/74   Pulse (!) 59   Temp 98.9 F (37.2 C) (Oral)   Resp 16   Ht 5\' 8"  (1.727 m)   Wt 187 lb (84.8 kg)   SpO2 98%   BMI 28.43 kg/m   Body mass index is 28.43 kg/m.  Advanced Directives 09/27/2017 06/15/2017 09/26/2016 06/19/2013 06/13/2013  Does Patient Have a Medical Advance Directive? Yes Yes Yes - Patient has advance directive, copy not in chart  Type of Advance Directive Healthcare Power of [place];Living will Living will;Healthcare Power of [employer] Corporation[employer]orney Healthcare Power of [place];Living will - Living will;Healthcare Power of Attorney  Does patient want to make changes to medical advance directive? - No - Patient declined No - Patient declined - -  Copy of Healthcare Power of Attorney in Chart? No - copy requested No - copy requested No - copy requested - -  Pre-existing out of facility DNR order (yellow form or pink MOST form) - - - No -    Tobacco Social History   Tobacco Use  Smoking Status Never Smoker  Smokeless Tobacco Never Used  Tobacco Comment   Married, 3 grown children. Retired 04/2002 from [employer] work     Counseling given: Not Answered Comment: Married, 3 grown children. Retired 04/2002 from [employer] work  Past Medical  History:  Diagnosis Date  . Colonic polyp 2011 last colo   colo q 8270yr - [facility]   . Coronary artery disease 02/2009   severe left main and three-vessel, CABG  . DEGENERATIVE JOINT DISEASE   . Diverticulosis of colon   . DIVERTICULOSIS, COLON   . HYPERLIPIDEMIA-MIXED   . HYPERTENSION, BENIGN   . LBP (low back pain)    s/p decompression 8/14; ESI - [place]; RFA x 3 early 2015  . Pacemaker    a. 05/2017: St. Jude (serial number [id number]) pacemaker  . Symptomatic bradycardia    a. s/p St. Jude (serial number [id number]) pacemaker 06/16/17  . Type II or unspecified type diabetes mellitus without mention of complication, not stated as uncontrolled   . VITAMIN D DEFICIENCY    Past Surgical History:  Procedure Laterality Date  . CARPAL TUNNEL RELEASE Right   . CATARACT EXTRACTION, BILATERAL     R 07/07/13, L 07/21/13  . CORONARY ARTERY BYPASS GRAFT  03/17/09   x 4  . INGUINAL HERNIA REPAIR  1980  . KNEE ARTHROSCOPY  2007   right  . LUMBAR EPIDURAL INJECTION  2013   Has as needed.  . LUMBAR LAMINECTOMY/DECOMPRESSION MICRODISCECTOMY N/A 06/19/2013   Procedure: LUMBAR LAMINECTOMY/DECOMPRESSION MICRODISCECTOMY;  Surgeon: Emilee HeroMark Leonard Dumonski, MD;  [facility];  Service: Orthopedics;  Laterality: N/A;  Lumbar 4-5 decompression  . PACEMAKER IMPLANT N/A 06/16/2017   Procedure: Pacemaker Implant;  Surgeon: Marinus Mawaylor, Gregg W, MD;  [facility];  Service: Cardiovascular;  Laterality: N/A;  . TRIGGER FINGER RELEASE Right    Family History  Problem Relation Age of Onset  . Heart failure Mother        died age 31  . Heart failure Father        died age 38  . Diabetes Other   . Heart disease Neg Hx    Social History   Socioeconomic History  . Marital status: Married    Spouse name: None  . Number of children: None  . Years of education: None  . Highest education level: None  Social Needs  . Financial resource strain: None  . Food insecurity - worry: None  . Food insecurity -  inability: None  . Transportation needs - medical: None  . Transportation needs - non-medical: None  Occupational History  . None  Tobacco Use  . Smoking status: Never Smoker  . Smokeless tobacco: Never Used  . Tobacco comment: Married, 3 grown children. Retired 04/2002 from [employer] work  Substance and Sexual Activity  . Alcohol use: Yes    Comment: RARE  . Drug use: No  . Sexual activity: None  Other Topics Concern  . None  Social History Narrative  . None    Outpatient Encounter Medications as of 09/27/2017  Medication Sig  . aspirin EC 81 MG tablet Take 81 mg by mouth daily.  Marland Kitchen atorvastatin (LIPITOR) 20 MG tablet Take 1 tablet (20 mg total) by mouth daily at 6 PM.  . carvedilol (COREG) 6.25 MG tablet Take 1 tablet (6.25 mg total) by mouth 2 (two) times daily with a meal.  . celecoxib (CELEBREX) 200 MG capsule Take 200 mg by mouth 2 (two) times daily.  . cholecalciferol (VITAMIN D) 1000 UNITS tablet Take 1,000 Units by mouth daily.  Marland Kitchen HYDROcodone-acetaminophen (NORCO/VICODIN) 5-325 MG per tablet Take 1 tablet by mouth every 6 (six) hours as needed for moderate pain.   . metFORMIN (GLUCOPHAGE-XR) 500 MG 24 hr tablet TAKE 1 TABLET TWICE A DAY  . Multiple Vitamin (MULTIVITAMIN WITH MINERALS) TABS tablet Take 1 tablet by mouth daily.  . niacin (NIASPAN) 1000 MG CR tablet Take 1 tablet (1,000 mg total) by mouth at bedtime.  Marland Kitchen warfarin (COUMADIN) 5 MG tablet Take 1 tablet (5 mg total) by mouth as directed. Take as directed by the coumadin clinic.   No facility-administered encounter medications on file as of 09/27/2017.     Activities of Daily Living In your present state of health, do you have any difficulty performing the following activities: 09/27/2017 06/15/2017  Hearing? Y -  Vision? N -  Difficulty concentrating or making decisions? N -  Walking or climbing stairs? N -  Dressing or bathing? N -  Doing errands, shopping? N N  Preparing Food and eating ? N -    Using the Toilet? N -  In the past six months, have you accidently leaked urine? N -  Do you have problems with loss of bowel control? N -  Managing your Medications? N -  Managing your Finances? N -  Housekeeping or managing your Housekeeping? N -  Some recent data might be hidden   Patient Care Team: Pincus Sanes, MD as PCP - General (Internal Medicine) Lunette Stands, MD as Consulting Physician (Orthopedic Surgery) Loreta Ave, MD as Consulting Physician (Orthopedic Surgery) Antonieta Iba, MD as Consulting Physician (Cardiology) Estill Bamberg, MD (Orthopedic Surgery) Francene Boyers, MD (Optometry) Carman Ching, MD (Gastroenterology) Donzetta Starch,  MD as Consulting Physician (Dermatology)   Assessment:    Physical assessment deferred to PCP.  Exercise Activities and Dietary recommendations Current Exercise Habits: Home exercise routine, Type of exercise: stretching;walking, Time (Minutes): 30, Frequency (Times/Week): 4, Weekly Exercise (Minutes/Week): 120, Intensity: Mild, Exercise limited by: orthopedic condition(s)  Diet (meal preparation, eat out, water intake, caffeinated beverages, dairy products, fruits and vegetables): in general, a "healthy" diet  , well balanced, eats a variety of fruits and vegetables daily, limits salt, fat/cholesterol, sugar, caffeine, drinks 6-8 glasses of water daily.  Goals    . Patient Stated     Maintain current health status. I want to continue to enjoy off shore fishing. Enjoy life and family.       Fall Risk Fall Risk  09/27/2017 09/27/2017 09/26/2016 03/22/2016 09/22/2015  Falls in the past year? Yes No No No No  Number falls in past yr: 1 - - - -  Injury with Fall? Yes - - - -    Depression Screen PHQ 2/9 Scores 09/27/2017 09/27/2017 09/26/2016 03/22/2016  PHQ - 2 Score 0 0 0 0    Cognitive Function MMSE - Mini Mental State Exam 09/27/2017  Orientation to time 5  Orientation to Place 5  Registration 3  Attention/  Calculation 5  Recall 2  Language- name 2 objects 2  Language- repeat 1  Language- follow 3 step command 3  Language- read & follow direction 1  Write a sentence 1  Copy design 1  Total score 29        Immunization History  Administered Date(s) Administered  . Influenza Split 09/26/2012  . Influenza, Seasonal, Injecte, Preservative Fre 07/23/2013  . Influenza-Unspecified 09/24/2014, 07/24/2015, 08/04/2016, 08/13/2017  . Pneumococcal Conjugate-13 09/22/2015  . Pneumococcal-Unspecified 09/25/2011  . Td 11/07/2002, 03/26/2013   Screening Tests Health Maintenance  Topic Date Due  . FOOT EXAM  09/25/2017  . URINE MICROALBUMIN  09/25/2017  . OPHTHALMOLOGY EXAM  09/11/2018  . COLONOSCOPY  10/20/2020  . TETANUS/TDAP  03/27/2023  . INFLUENZA VACCINE  Completed  . PNA vac Low Risk Adult  Completed      Plan:    Continue doing brain stimulating activities (puzzles, reading, adult coloring books, staying active) to keep memory sharp.   Continue to eat heart healthy diet (full of fruits, vegetables, whole grains, lean protein, water--limit salt, fat, and sugar intake) and increase physical activity as tolerated.  I have personally reviewed and noted the following in the patient's chart:   . Medical and social history . Use of alcohol, tobacco or illicit drugs  . Current medications and supplements . Functional ability and status . Nutritional status . Physical activity . Advanced directives . List of other physicians . Vitals . Screenings to include cognitive, depression, and falls . Referrals and appointments  In addition, I have reviewed and discussed with patient certain preventive protocols, quality metrics, and best practice recommendations. A written personalized care plan for preventive services as well as general preventive health recommendations were provided to patient.     Wanda PlumpJill A Georgie Haque, RN  09/27/2017  Medical screening examination/treatment/procedure(s) were  performed by non-physician practitioner and as supervising physician I was immediately available for consultation/collaboration. I agree with above. Pincus SanesStacy J Burns, MD

## 2017-09-26 NOTE — Patient Instructions (Addendum)
Have blood work done today.    Follow up in 6 months  Continue doing brain stimulating activities (puzzles, reading, adult coloring books, staying active) to keep memory sharp.   Continue to eat heart healthy diet (full of fruits, vegetables, whole grains, lean protein, water--limit salt, fat, and sugar intake) and increase physical activity as tolerated.   [name] , Thank you for taking time to come for your Medicare Wellness Visit. I appreciate your ongoing commitment to your health goals. Please review the following plan we discussed and let me know if I can assist you in the future.   These are the goals we discussed: Goals    . Patient Stated     Maintain current health status. I want to continue to enjoy off shore fishing. Enjoy life and family.        This is a list of the screening recommended for you and due dates:  Health Maintenance  Topic Date Due  . Complete foot exam   09/25/2017  . Urine Protein Check  09/25/2017  . Eye exam for diabetics  09/11/2018  . Colon Cancer Screening  10/20/2020  . Tetanus Vaccine  03/27/2023  . Flu Shot  Completed  . Pneumonia vaccines  Completed

## 2017-09-26 NOTE — Progress Notes (Signed)
Subjective:    [name], male    DOB: [birth year], 83 y.o.   MRN: [id number]  HPI He is here for a physical exam.   He feels good, except back pain.  He had a DVT recently and is on warfarin.   Medications and allergies reviewed with patient and updated if appropriate.  Patient Active Problem List   Diagnosis Date Noted  . DVT (deep venous thrombosis) (HCC) 08/28/2017  . Complete heart block (HCC) 06/15/2017  . Chronic fatigue 01/20/2017  . Near syncope 12/21/2016  . Wears hearing aid 09/25/2016  . Degeneration of lumbar or lumbosacral intervertebral disc 03/23/2015  . Orthostatic hypotension 03/27/2013  . Generalized muscle ache 05/28/2012  . Vitamin D deficiency 11/03/2010  . DIVERTICULOSIS, COLON 11/03/2010  . Osteoarthritis (arthritis due to wear and tear of joints) 11/03/2010  . Hyperlipidemia 12/01/2009  . HYPERTENSION, BENIGN 12/01/2009  . CAD, ARTERY BYPASS GRAFT 12/01/2009  . Type 2 diabetes mellitus with neurological manifestations, controlled (HCC)     Current Outpatient Medications on File Prior to Visit  Medication Sig Dispense Refill  . aspirin EC 81 MG tablet Take 81 mg by mouth daily.    Marland Kitchen. atorvastatin (LIPITOR) 20 MG tablet Take 1 tablet (20 mg total) by mouth daily at 6 PM. 90 tablet 3  . carvedilol (COREG) 6.25 MG tablet Take 1 tablet (6.25 mg total) by mouth 2 (two) times daily with a meal. 180 tablet 3  . celecoxib (CELEBREX) 200 MG capsule Take 200 mg by mouth 2 (two) times daily.    . cholecalciferol (VITAMIN D) 1000 UNITS tablet Take 1,000 Units by mouth daily.    Marland Kitchen. HYDROcodone-acetaminophen (NORCO/VICODIN) 5-325 MG per tablet Take 1 tablet by mouth every 6 (six) hours as needed for moderate pain.     . metFORMIN (GLUCOPHAGE-XR) 500 MG 24 hr tablet TAKE 1 TABLET TWICE A DAY 180 tablet 2  . Multiple Vitamin (MULTIVITAMIN WITH MINERALS) TABS tablet Take 1 tablet by mouth daily.    . niacin (NIASPAN) 1000 MG CR tablet Take 1 tablet  (1,000 mg total) by mouth at bedtime. 90 tablet 3  . warfarin (COUMADIN) 5 MG tablet Take 1 tablet (5 mg total) by mouth as directed. Take as directed by the coumadin clinic. 30 tablet 0   No current facility-administered medications on file prior to visit.     Past Medical History:  Diagnosis Date  . Colonic polyp 2011 last colo   colo q 6873yr - [facility]   . Coronary artery disease 02/2009   severe left main and three-vessel, CABG  . DEGENERATIVE JOINT DISEASE   . Diverticulosis of colon   . DIVERTICULOSIS, COLON   . HYPERLIPIDEMIA-MIXED   . HYPERTENSION, BENIGN   . LBP (low back pain)    s/p decompression 8/14; ESI - [place]; RFA x 3 early 2015  . Pacemaker    a. 05/2017: St. Jude (serial number [id number]) pacemaker  . Symptomatic bradycardia    a. s/p St. Jude (serial number [id number]) pacemaker 06/16/17  . Type II or unspecified type diabetes mellitus without mention of complication, not stated as uncontrolled   . VITAMIN D DEFICIENCY     Past Surgical History:  Procedure Laterality Date  . CARPAL TUNNEL RELEASE Right   . CATARACT EXTRACTION, BILATERAL     R 07/07/13, L 07/21/13  . CORONARY ARTERY BYPASS GRAFT  03/17/09   x 4  . INGUINAL HERNIA REPAIR  1980  . KNEE ARTHROSCOPY  2007  right  . LUMBAR EPIDURAL INJECTION  2013   Has as needed.  . LUMBAR LAMINECTOMY/DECOMPRESSION MICRODISCECTOMY N/A 06/19/2013   Procedure: LUMBAR LAMINECTOMY/DECOMPRESSION MICRODISCECTOMY;  Surgeon: Emilee HeroMark Leonard Dumonski, MD;  [facility];  Service: Orthopedics;  Laterality: N/A;  Lumbar 4-5 decompression  . PACEMAKER IMPLANT N/A 06/16/2017   Procedure: Pacemaker Implant;  Surgeon: Marinus Mawaylor, Gregg W, MD;  [facility];  Service: Cardiovascular;  Laterality: N/A;  . TRIGGER FINGER RELEASE Right     Social History   Socioeconomic History  . Marital status: Married    Spouse name: None  . Number of children: None  . Years of education: None  . Highest education level: None  Social  Needs  . Financial resource strain: None  . Food insecurity - worry: None  . Food insecurity - inability: None  . Transportation needs - medical: None  . Transportation needs - non-medical: None  Occupational History  . None  Tobacco Use  . Smoking status: Never Smoker  . Smokeless tobacco: Never Used  . Tobacco comment: Married, 3 grown children. Retired 04/2002 from [employer] work  Substance and Sexual Activity  . Alcohol use: Yes    Comment: RARE  . Drug use: No  . Sexual activity: None  Other Topics Concern  . None  Social History Narrative  . None    Family History  Problem Relation Age of Onset  . Heart failure Mother        died age 81  . Heart failure Father        died age 81  . Diabetes Other   . Heart disease Neg Hx     Review of Systems  Constitutional: Negative for appetite change, chills and fever.  Eyes: Negative for visual disturbance.  Respiratory: Negative for cough, shortness of breath and wheezing.   Cardiovascular: Positive for leg swelling (left leg). Negative for chest pain and palpitations.  Gastrointestinal: Negative for abdominal pain, blood in stool, constipation, diarrhea and nausea.  Genitourinary: Negative for dysuria and hematuria.  Musculoskeletal: Positive for back pain.  Skin: Negative for color change and rash.  Neurological: Negative for light-headedness and headaches.  Hematological: Does not bruise/bleed easily.  Psychiatric/Behavioral: Negative for dysphoric mood. The patient is not nervous/anxious.        Objective:   Vitals:   09/27/17 0909  BP: 124/74  Pulse: (!) 59  Resp: 16  Temp: 98.9 F (37.2 C)  SpO2: 98%   Filed Weights   09/27/17 0909  Weight: 187 lb (84.8 kg)   Body mass index is 28.43 kg/m.  Wt Readings from Last 3 Encounters:  09/27/17 187 lb (84.8 kg)  09/17/17 188 lb 3.2 oz (85.4 kg)  07/23/17 181 lb 12 oz (82.4 kg)     Physical Exam Constitutional: He appears well-developed  and well-nourished. No distress.  HENT:  Head: Normocephalic and atraumatic.  Right Ear: External ear normal.  Left Ear: External ear normal.  Mouth/Throat: Oropharynx is clear and moist.  Normal ear canals and TM b/l  Eyes: Conjunctivae and EOM are normal.  Neck: Neck supple. No tracheal deviation present. No thyromegaly present.  No carotid bruit  Cardiovascular: Normal rate, regular rhythm, normal heart sounds and intact distal pulses.   No murmur heard. Pulmonary/Chest: Effort normal and breath sounds normal. No respiratory distress. He has no wheezes. He has no rales.  Abdominal: Soft. He exhibits no distension. There is no tenderness.  Genitourinary: deferred  Musculoskeletal: He exhibits no  edema.  Lymphadenopathy:   He has no cervical adenopathy.  Skin: Skin is warm and dry. He is not diaphoretic.  Psychiatric: He has a normal mood and affect. His behavior is normal.   Diabetic Foot Exam - Simple   Simple Foot Form Visual Inspection No deformities, no ulcerations, no other skin breakdown bilaterally:  Yes Sensation Testing See comments:  Yes Pulse Check See comments:  Yes Comments Decreased monofilament sensation at bottom of toe and heel area bilaterally.   +2 dorsalis and +1 tibialis pulse bilaterally, feet cool to touch, brisk capillary refill.   Diabetic foot education provided           Assessment & Plan:   Physical exam: Screening blood work ordered Immunizations   Flu vaccine up to date, others up to date Colonoscopy   Up to date  Eye exams   Up to date  EKG  Done 08/2017 Exercise  Split, hauling wood, yard work, gardening [employer] - overweight - good for age Skin  No concerns Substance abuse   none  See Problem List for Assessment and Plan of chronic medical problems.  FU in 6 months

## 2017-09-27 ENCOUNTER — Telehealth: Payer: Self-pay | Admitting: Cardiovascular Disease

## 2017-09-27 ENCOUNTER — Other Ambulatory Visit (INDEPENDENT_AMBULATORY_CARE_PROVIDER_SITE_OTHER): Payer: Medicare Other

## 2017-09-27 ENCOUNTER — Ambulatory Visit (INDEPENDENT_AMBULATORY_CARE_PROVIDER_SITE_OTHER): Payer: Medicare Other | Admitting: Internal Medicine

## 2017-09-27 ENCOUNTER — Encounter: Payer: Self-pay | Admitting: Internal Medicine

## 2017-09-27 ENCOUNTER — Telehealth: Payer: Self-pay | Admitting: *Deleted

## 2017-09-27 VITALS — BP 124/74 | HR 59 | Temp 98.9°F | Resp 16 | Ht 68.0 in | Wt 187.0 lb

## 2017-09-27 DIAGNOSIS — I25708 Atherosclerosis of coronary artery bypass graft(s), unspecified, with other forms of angina pectoris: Secondary | ICD-10-CM

## 2017-09-27 DIAGNOSIS — I10 Essential (primary) hypertension: Secondary | ICD-10-CM

## 2017-09-27 DIAGNOSIS — Z0000 Encounter for general adult medical examination without abnormal findings: Secondary | ICD-10-CM

## 2017-09-27 DIAGNOSIS — E1149 Type 2 diabetes mellitus with other diabetic neurological complication: Secondary | ICD-10-CM

## 2017-09-27 DIAGNOSIS — I82409 Acute embolism and thrombosis of unspecified deep veins of unspecified lower extremity: Secondary | ICD-10-CM

## 2017-09-27 DIAGNOSIS — E7849 Other hyperlipidemia: Secondary | ICD-10-CM | POA: Diagnosis not present

## 2017-09-27 LAB — CBC WITH DIFFERENTIAL/PLATELET
Basophils Absolute: 0.1 10*3/uL (ref 0.0–0.1)
Basophils Relative: 0.8 % (ref 0.0–3.0)
Eosinophils Absolute: 0.3 10*3/uL (ref 0.0–0.7)
Eosinophils Relative: 5 % (ref 0.0–5.0)
HEMATOCRIT: 39.6 % (ref 39.0–52.0)
HEMOGLOBIN: 13.2 g/dL (ref 13.0–17.0)
LYMPHS ABS: 1.8 10*3/uL (ref 0.7–4.0)
Lymphocytes Relative: 27.2 % (ref 12.0–46.0)
MCHC: 33.4 g/dL (ref 30.0–36.0)
MCV: 99.4 fl (ref 78.0–100.0)
MONO ABS: 0.8 10*3/uL (ref 0.1–1.0)
Monocytes Relative: 11.8 % (ref 3.0–12.0)
NEUTROS ABS: 3.7 10*3/uL (ref 1.4–7.7)
Neutrophils Relative %: 55.2 % (ref 43.0–77.0)
Platelets: 217 10*3/uL (ref 150.0–400.0)
RBC: 3.99 Mil/uL — AB (ref 4.22–5.81)
RDW: 14.5 % (ref 11.5–15.5)
WBC: 6.7 10*3/uL (ref 4.0–10.5)

## 2017-09-27 LAB — LIPID PANEL
CHOLESTEROL: 141 mg/dL (ref 0–200)
HDL: 47.6 mg/dL (ref >39.00)
LDL CALC: 56 mg/dL (ref 0–99)
NonHDL: 93.29
Total CHOL/HDL Ratio: 3
Triglycerides: 186 mg/dL — ABNORMAL HIGH (ref 0.0–149.0)
VLDL: 37.2 mg/dL (ref 0.0–40.0)

## 2017-09-27 LAB — COMPREHENSIVE METABOLIC PANEL
ALK PHOS: 61 U/L (ref 39–117)
ALT: 17 U/L (ref 0–53)
AST: 21 U/L (ref 0–37)
Albumin: 4.4 g/dL (ref 3.5–5.2)
BILIRUBIN TOTAL: 0.6 mg/dL (ref 0.2–1.2)
BUN: 26 mg/dL — AB (ref 6–23)
CO2: 27 meq/L (ref 19–32)
Calcium: 9.4 mg/dL (ref 8.4–10.5)
Chloride: 107 mEq/L (ref 96–112)
Creatinine, Ser: 1.19 mg/dL (ref 0.40–1.50)
GFR: 62.32 mL/min (ref >60.00)
GLUCOSE: 106 mg/dL — AB (ref 70–99)
Potassium: 4.7 mEq/L (ref 3.5–5.1)
Sodium: 142 mEq/L (ref 135–145)
TOTAL PROTEIN: 6.9 g/dL (ref 6.0–8.3)

## 2017-09-27 LAB — TSH: TSH: 1.2 u[IU]/mL (ref 0.35–4.50)

## 2017-09-27 LAB — HEMOGLOBIN A1C: HEMOGLOBIN A1C: 6.6 % — AB (ref 4.6–6.5)

## 2017-09-27 NOTE — Telephone Encounter (Signed)
Follow Up:; ° ° °Returning your call. °

## 2017-09-27 NOTE — Assessment & Plan Note (Signed)
BP well controlled Current regimen effective and well tolerated Continue current medications at current doses cmp  

## 2017-09-27 NOTE — Assessment & Plan Note (Signed)
Check a1c, urine micro Low sugar / carb diet Stressed regular exercise, keeping weight down

## 2017-09-27 NOTE — Telephone Encounter (Signed)
LMOVM TO CONTACT  SURGERY CLEARANCE DEPT TO FOLLOW UP ON PHONE CALL

## 2017-09-27 NOTE — Assessment & Plan Note (Signed)
Asymptomatic  Continue current meds Following with cardiology 

## 2017-09-27 NOTE — Assessment & Plan Note (Signed)
Warfarin management by cardiology

## 2017-09-27 NOTE — Assessment & Plan Note (Signed)
Check lipid panel  Continue daily statin Regular exercise and healthy diet encouraged  

## 2017-09-28 LAB — MICROALBUMIN / CREATININE URINE RATIO
CREATININE, U: 148.6 mg/dL
Microalb Creat Ratio: 0.5 mg/g (ref 0.0–30.0)
Microalb, Ur: <0.7 mg/dL (ref 0.0–1.9)

## 2017-10-08 NOTE — Telephone Encounter (Signed)
Spoke with Andrew DikeJennifer @ [facility] and let her know that it has been recommended that pt wait on the injection since he has only been on anticoagulations 2 months for his DVT.  She thanked me for the call.

## 2017-10-10 ENCOUNTER — Ambulatory Visit (INDEPENDENT_AMBULATORY_CARE_PROVIDER_SITE_OTHER): Payer: Medicare Other

## 2017-10-10 DIAGNOSIS — I82409 Acute embolism and thrombosis of unspecified deep veins of unspecified lower extremity: Secondary | ICD-10-CM

## 2017-10-10 DIAGNOSIS — Z5181 Encounter for therapeutic drug level monitoring: Secondary | ICD-10-CM

## 2017-10-10 LAB — POCT INR: INR: 2.5

## 2017-10-10 MED ORDER — WARFARIN SODIUM 5 MG PO TABS: 5.0000 mg | ORAL_TABLET | ORAL | 1 refills | Status: DC

## 2017-10-10 NOTE — Patient Instructions (Signed)
Please continue dosage of 1 tablet daily except 1/2 tablet on Mondays, Wednesdays and Fridays. Recheck INR in 3 weeks.

## 2017-10-14 ENCOUNTER — Other Ambulatory Visit: Payer: Self-pay | Admitting: Cardiovascular Disease

## 2017-10-25 ENCOUNTER — Telehealth: Payer: Self-pay | Admitting: *Deleted

## 2017-10-25 NOTE — Telephone Encounter (Signed)
Spoke with patient and he states that he saw the results via mychart and had no further questions at this time.

## 2017-10-25 NOTE — Telephone Encounter (Signed)
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 Would restart warfarin after cortisone complete

## 2017-10-25 NOTE — Telephone Encounter (Signed)
Copied from CRM #[id number]. Topic: Quick Communication - Rx Refill/Question >> Oct 25, 2017 11:14 AM Percival SpanishKennedy, Cheryl W wrote: Has the patient contacted their pharmacy yes   RX   niacin (NIASPAN) 1000 MG CR tablet  [facility]   Agent: Please be advised that RX refills may take up to 3 business days. We ask that you follow-up with your pharmacy.

## 2017-10-26 ENCOUNTER — Telehealth: Payer: Self-pay | Admitting: Cardiovascular Disease

## 2017-10-26 NOTE — Telephone Encounter (Signed)
S/w Danielle at [facility]. 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 Would restart warfarin after cortisone complete.   She was appreciative and will let us know if it is not received.

## 2017-10-26 NOTE — Telephone Encounter (Signed)
Please call regarding stopping Warfarin for cortisone shots. Fax [phone]

## 2017-10-29 ENCOUNTER — Telehealth: Payer: Self-pay | Admitting: Cardiovascular Disease

## 2017-10-29 ENCOUNTER — Ambulatory Visit: Payer: Medicare Other | Admitting: Cardiovascular Disease

## 2017-10-29 NOTE — Telephone Encounter (Signed)
Pt will discuss at appt on 1/9 so we "can do everything at one time".

## 2017-10-29 NOTE — Telephone Encounter (Signed)
Pt states he is having some injections in his back scheduled for 1/14, [place].  He would like to know about holding his warfarin. He states they need his coumadin level at 1.3 or less. Please call. Pt is being seen on 1/9

## 2017-10-31 ENCOUNTER — Ambulatory Visit (INDEPENDENT_AMBULATORY_CARE_PROVIDER_SITE_OTHER): Payer: Medicare Other

## 2017-10-31 DIAGNOSIS — Z5181 Encounter for therapeutic drug level monitoring: Secondary | ICD-10-CM

## 2017-10-31 DIAGNOSIS — I82409 Acute embolism and thrombosis of unspecified deep veins of unspecified lower extremity: Secondary | ICD-10-CM

## 2017-10-31 LAB — POCT INR: INR: 3.2

## 2017-10-31 NOTE — Patient Instructions (Signed)
Per [facility] Maurice [facility]bazebo @ [facility], please hold coumadin x 5 days.  Recheck INR on Monday to ensure that INR is < 1.3 per his recommendation.

## 2017-11-02 ENCOUNTER — Telehealth: Payer: Self-pay | Admitting: Cardiovascular Disease

## 2017-11-02 ENCOUNTER — Other Ambulatory Visit: Payer: Self-pay

## 2017-11-02 MED ORDER — NIACIN ER (ANTIHYPERLIPIDEMIC) 1000 MG PO TBCR: 1000.0000 mg | EXTENDED_RELEASE_TABLET | Freq: Every day | ORAL | 3 refills | Status: DC

## 2017-11-02 NOTE — Telephone Encounter (Signed)
°*  STAT* If patient is at the pharmacy, call can be transferred to refill team.   1. Which medications need to be refilled? (please list name of each medication and dose if known) Niacin 1000 mg   2. Which pharmacy/location (including street and city if local pharmacy) is [facility]   3. Do they need a 30 day or 90 day supply? 90 day

## 2017-11-05 ENCOUNTER — Ambulatory Visit (INDEPENDENT_AMBULATORY_CARE_PROVIDER_SITE_OTHER): Payer: Medicare Other

## 2017-11-05 DIAGNOSIS — Z5181 Encounter for therapeutic drug level monitoring: Secondary | ICD-10-CM

## 2017-11-05 DIAGNOSIS — I82409 Acute embolism and thrombosis of unspecified deep veins of unspecified lower extremity: Secondary | ICD-10-CM | POA: Diagnosis not present

## 2017-11-05 LAB — POCT INR: INR: 1.1

## 2017-11-05 NOTE — Patient Instructions (Signed)
Per [facility]bazebo @ [facility], please hold coumadin x 5 days.  If ok'd by MD, please resume coumadin tonight, w/ extra 1/2 dose x 2 days. Recheck in 1 week.

## 2017-11-08 ENCOUNTER — Other Ambulatory Visit: Payer: Self-pay | Admitting: Emergency Medicine

## 2017-11-08 MED ORDER — NIACIN ER (ANTIHYPERLIPIDEMIC) 1000 MG PO TBCR: 1000.0000 mg | EXTENDED_RELEASE_TABLET | Freq: Every day | ORAL | 3 refills | Status: DC

## 2017-11-11 ENCOUNTER — Other Ambulatory Visit: Payer: Self-pay | Admitting: Internal Medicine

## 2017-11-12 ENCOUNTER — Ambulatory Visit (INDEPENDENT_AMBULATORY_CARE_PROVIDER_SITE_OTHER): Payer: Medicare Other

## 2017-11-12 DIAGNOSIS — Z5181 Encounter for therapeutic drug level monitoring: Secondary | ICD-10-CM | POA: Diagnosis not present

## 2017-11-12 DIAGNOSIS — I82409 Acute embolism and thrombosis of unspecified deep veins of unspecified lower extremity: Secondary | ICD-10-CM

## 2017-11-12 LAB — POCT INR: INR: 3.5

## 2017-11-12 NOTE — Patient Instructions (Signed)
Please skip today's dose of coumadin, then resume previous dose of 5 mg daily except 2.5 mg on Mondays, Wednesdays and Fridays. Please have a large serving of greens today. Recheck in 2 weeks.

## 2017-11-26 ENCOUNTER — Ambulatory Visit (INDEPENDENT_AMBULATORY_CARE_PROVIDER_SITE_OTHER): Payer: Medicare Other

## 2017-11-26 DIAGNOSIS — I82409 Acute embolism and thrombosis of unspecified deep veins of unspecified lower extremity: Secondary | ICD-10-CM | POA: Diagnosis not present

## 2017-11-26 DIAGNOSIS — Z5181 Encounter for therapeutic drug level monitoring: Secondary | ICD-10-CM | POA: Diagnosis not present

## 2017-11-26 LAB — POCT INR: INR: 3.5

## 2017-11-26 NOTE — Patient Instructions (Signed)
Please skip today's dose of coumadin, then start new dose of 2.5 mg daily except 5 mg on Mondays, Wednesdays and Fridays. Recheck in 2 weeks.

## 2017-12-10 ENCOUNTER — Ambulatory Visit (INDEPENDENT_AMBULATORY_CARE_PROVIDER_SITE_OTHER): Payer: Medicare Other

## 2017-12-10 DIAGNOSIS — Z5181 Encounter for therapeutic drug level monitoring: Secondary | ICD-10-CM

## 2017-12-10 DIAGNOSIS — I82409 Acute embolism and thrombosis of unspecified deep veins of unspecified lower extremity: Secondary | ICD-10-CM | POA: Diagnosis not present

## 2017-12-10 LAB — POCT INR: INR: 2.4

## 2017-12-10 NOTE — Patient Instructions (Signed)
Please continue dose of 2.5 mg daily except 5 mg on Mondays, Wednesdays and Fridays. Recheck in 3 weeks.

## 2017-12-14 ENCOUNTER — Ambulatory Visit (INDEPENDENT_AMBULATORY_CARE_PROVIDER_SITE_OTHER): Payer: Medicare Other

## 2017-12-14 DIAGNOSIS — I82409 Acute embolism and thrombosis of unspecified deep veins of unspecified lower extremity: Secondary | ICD-10-CM | POA: Diagnosis not present

## 2017-12-17 ENCOUNTER — Telehealth: Payer: Self-pay | Admitting: Cardiology

## 2017-12-17 ENCOUNTER — Ambulatory Visit (INDEPENDENT_AMBULATORY_CARE_PROVIDER_SITE_OTHER): Payer: Medicare Other | Admitting: *Deleted

## 2017-12-17 DIAGNOSIS — I442 Atrioventricular block, complete: Secondary | ICD-10-CM | POA: Diagnosis not present

## 2017-12-17 NOTE — Telephone Encounter (Signed)
Spoke with pt and reminded pt of remote transmission that is due today. Pt verbalized understanding.   

## 2017-12-17 NOTE — Progress Notes (Signed)
Remote pacemaker transmission.   

## 2017-12-20 ENCOUNTER — Encounter: Payer: Self-pay | Admitting: Cardiology

## 2017-12-31 ENCOUNTER — Ambulatory Visit (INDEPENDENT_AMBULATORY_CARE_PROVIDER_SITE_OTHER): Payer: Medicare Other

## 2017-12-31 DIAGNOSIS — I82409 Acute embolism and thrombosis of unspecified deep veins of unspecified lower extremity: Secondary | ICD-10-CM | POA: Diagnosis not present

## 2017-12-31 DIAGNOSIS — Z5181 Encounter for therapeutic drug level monitoring: Secondary | ICD-10-CM

## 2017-12-31 LAB — POCT INR: INR: 2.7

## 2017-12-31 NOTE — Patient Instructions (Signed)
Please continue dose of 2.5 mg daily except 5 mg on Mondays, Wednesdays and Fridays. Recheck in 4 weeks.

## 2018-01-03 LAB — CUP PACEART REMOTE DEVICE CHECK
Battery Remaining Longevity: 127 mo
Battery Remaining Percentage: 95.5 %
Battery Voltage: 3.02 V
Brady Statistic AS VS Percent: 1 %
Brady Statistic RA Percent Paced: 21 %
Implantable Lead Implant Date: 20180825
Implantable Lead Implant Date: 20180825
Implantable Lead Location: 753859
Implantable Lead Location: 753860
Implantable Pulse Generator Implant Date: 20180825
Lead Channel Impedance Value: 490 Ohm
Lead Channel Pacing Threshold Amplitude: 0.5 V
Lead Channel Pacing Threshold Amplitude: 0.5 V
Lead Channel Pacing Threshold Pulse Width: 0.5 ms
Lead Channel Pacing Threshold Pulse Width: 0.5 ms
Lead Channel Sensing Intrinsic Amplitude: 4.1 mV
MDC IDC MSMT LEADCHNL RV IMPEDANCE VALUE: 750 Ohm
MDC IDC MSMT LEADCHNL RV SENSING INTR AMPL: 6.4 mV
MDC IDC PG SERIAL: 8939923
MDC IDC SESS DTM: 20190225190447
MDC IDC SET LEADCHNL RA PACING AMPLITUDE: 2 V
MDC IDC SET LEADCHNL RV PACING AMPLITUDE: 0.75 V
MDC IDC SET LEADCHNL RV PACING PULSEWIDTH: 0.5 ms
MDC IDC SET LEADCHNL RV SENSING SENSITIVITY: 4 mV
MDC IDC STAT BRADY AP VP PERCENT: 22 %
MDC IDC STAT BRADY AP VS PERCENT: 1 %
MDC IDC STAT BRADY AS VP PERCENT: 77 %
MDC IDC STAT BRADY RV PERCENT PACED: 99 %

## 2018-01-28 ENCOUNTER — Ambulatory Visit (INDEPENDENT_AMBULATORY_CARE_PROVIDER_SITE_OTHER): Payer: Medicare Other

## 2018-01-28 DIAGNOSIS — I82409 Acute embolism and thrombosis of unspecified deep veins of unspecified lower extremity: Secondary | ICD-10-CM | POA: Diagnosis not present

## 2018-01-28 DIAGNOSIS — Z5181 Encounter for therapeutic drug level monitoring: Secondary | ICD-10-CM | POA: Diagnosis not present

## 2018-01-28 LAB — POCT INR: INR: 2.3

## 2018-01-28 NOTE — Patient Instructions (Signed)
Please continue dose of 2.5 mg daily except 5 mg on Mondays, Wednesdays and Fridays. Recheck in 4 weeks.  

## 2018-02-05 ENCOUNTER — Telehealth: Payer: Self-pay | Admitting: Cardiovascular Disease

## 2018-02-05 MED ORDER — WARFARIN SODIUM 5 MG PO TABS: ORAL_TABLET | ORAL | 2 refills | Status: DC

## 2018-02-05 NOTE — Telephone Encounter (Signed)
°*  STAT* If patient is at the pharmacy, call can be transferred to refill team.   1. Which medications need to be refilled? (please list name of each medication and dose if known) coumadin 5 mg po as directed    2. Which pharmacy/location (including street and city if local pharmacy) is medication to be sent to?  [facility] [address] [place]   3. Do they need a 30 day or 90 day supply? 30

## 2018-02-06 MED ORDER — WARFARIN SODIUM 5 MG PO TABS: ORAL_TABLET | ORAL | 2 refills | Status: DC

## 2018-02-06 NOTE — Addendum Note (Signed)
Addended by: Rhea BeltonMOODY, Laelyn Blumenthal R on: 02/06/2018 10:00 AM   Modules accepted: Orders

## 2018-02-06 NOTE — Telephone Encounter (Signed)
Pt called stating he only has 2 more coumadin tablets left and needs refill sent in today.  [facility] [address]

## 2018-02-19 ENCOUNTER — Telehealth: Payer: Self-pay | Admitting: Cardiovascular Disease

## 2018-02-19 NOTE — Telephone Encounter (Signed)
° °  [facility] Pre-operative Risk Assessment    Request for surgical clearance:  1. What type of surgery is being performed? Lumbar Facet Injection, Bilat L5-51   2. When is this surgery scheduled?    3. What type of clearance is required (medical clearance vs. Pharmacy clearance to hold med vs. Both)? Pharmacy clearance  4. Are there any medications that need to be held prior to surgery and how long? Warfarin    5. Practice name and name of physician performing surgery? Dr. Laroy Apple   6. What is your office phone number [phone]    7.   What is your office fax number [phone]  8.   Anesthesia type (None, local, MAC, general) ?    Andrew [name] 02/19/2018, 1:25 PM  _________________________________________________________________   (provider comments below)

## 2018-02-22 NOTE — Telephone Encounter (Signed)
Pt takes Coumadin for DVT diagnosed October 2018. Similar request to one from 09/20/17 - will need to clarify with Dr Mariah Milling if he is comfortable with patient holding his Coumadin for 5 days for spinal injection with or without Lovenox bridge, he is now 7 months out from DVT.

## 2018-02-22 NOTE — Telephone Encounter (Signed)
Patient calling to check status of clearance °

## 2018-02-24 NOTE — Telephone Encounter (Signed)
Ok to hold warfarin prior to the procedure 5 day hold ok Restart after procedure

## 2018-02-25 ENCOUNTER — Ambulatory Visit (INDEPENDENT_AMBULATORY_CARE_PROVIDER_SITE_OTHER): Payer: Medicare Other

## 2018-02-25 DIAGNOSIS — Z5181 Encounter for therapeutic drug level monitoring: Secondary | ICD-10-CM

## 2018-02-25 DIAGNOSIS — I82409 Acute embolism and thrombosis of unspecified deep veins of unspecified lower extremity: Secondary | ICD-10-CM | POA: Diagnosis not present

## 2018-02-25 LAB — POCT INR: INR: 2.7

## 2018-02-25 NOTE — Patient Instructions (Addendum)
Please continue dose of 2.5 mg daily except 5 mg on Mondays, Wednesdays and Fridays.  In preparation for upcoming back injection on 5/15, per Dr. Mariah Milling, ok to hold coumadin x 5 days before procedure: hold coumadin starting Friday, 5/10 and resume the evening of 5/15. If ok'd after procedure, restart night of injection w/ extra 1/2 tablet x 2 days.  Recheck on 5/15 to ensure that INR is < 1.3 per operating MD's request.

## 2018-03-06 ENCOUNTER — Ambulatory Visit (INDEPENDENT_AMBULATORY_CARE_PROVIDER_SITE_OTHER): Payer: Medicare Other

## 2018-03-06 DIAGNOSIS — Z5181 Encounter for therapeutic drug level monitoring: Secondary | ICD-10-CM

## 2018-03-06 DIAGNOSIS — I82409 Acute embolism and thrombosis of unspecified deep veins of unspecified lower extremity: Secondary | ICD-10-CM | POA: Diagnosis not present

## 2018-03-06 LAB — POCT INR: INR: 1.1

## 2018-03-06 NOTE — Patient Instructions (Signed)
Please resume coumadin tonight, w/ extra 1/2 tablet tomorrow and Friday, then resume previous dosage of dose of 2.5 mg daily except 5 mg on Mondays, Wednesdays and Fridays. Recheck in 1 week.

## 2018-03-13 ENCOUNTER — Other Ambulatory Visit: Payer: Self-pay | Admitting: Internal Medicine

## 2018-03-13 ENCOUNTER — Ambulatory Visit (INDEPENDENT_AMBULATORY_CARE_PROVIDER_SITE_OTHER): Payer: Medicare Other

## 2018-03-13 DIAGNOSIS — I82409 Acute embolism and thrombosis of unspecified deep veins of unspecified lower extremity: Secondary | ICD-10-CM | POA: Diagnosis not present

## 2018-03-13 DIAGNOSIS — Z5181 Encounter for therapeutic drug level monitoring: Secondary | ICD-10-CM | POA: Diagnosis not present

## 2018-03-13 LAB — POCT INR: INR: 1.8 — AB (ref 2.0–3.0)

## 2018-03-13 NOTE — Patient Instructions (Signed)
Please take extra 1/2 tablet tonight, then resume previous dosage of dose of 2.5 mg daily except 5 mg on Mondays, Wednesdays and Fridays. Recheck in 3 weeks.

## 2018-03-19 ENCOUNTER — Ambulatory Visit (INDEPENDENT_AMBULATORY_CARE_PROVIDER_SITE_OTHER): Payer: Medicare Other | Admitting: *Deleted

## 2018-03-19 DIAGNOSIS — I442 Atrioventricular block, complete: Secondary | ICD-10-CM | POA: Diagnosis not present

## 2018-03-19 NOTE — Progress Notes (Signed)
Remote pacemaker transmission.   

## 2018-03-22 ENCOUNTER — Encounter: Payer: Self-pay | Admitting: Cardiology

## 2018-03-27 NOTE — Progress Notes (Signed)
Subjective:    [name], male    DOB: Jun 29, 1934, 84 y.o.   MRN: [id number]  HPI The patient is here for follow up.  Diabetes: He is taking his medication daily as prescribed. He is compliant with a diabetic diet. He has cut back on his portions and has lost weight.  He is exercising regularly. He monitors his sugars and they have been running XXX. He checks his feet daily and denies foot lesions. He is up-to-date with an ophthalmology examination.   Hypertension: He is taking his medication daily. He is compliant with a low sodium diet.  He denies chest pain, palpitations, edema, shortness of breath and regular headaches. He is exercising regularly.  He does not monitor his blood pressure at home.    Hyperlipidemia: He is taking his medication daily. He is compliant with a low fat/cholesterol diet. He is exercising regularly. He denies myalgias.    Medications and allergies reviewed with patient and updated if appropriate.  Patient Active Problem List   Diagnosis Date Noted  . DVT (deep venous thrombosis) (HCC) 08/28/2017  . Complete heart block (HCC) 06/15/2017  . Chronic fatigue 01/20/2017  . Near syncope 12/21/2016  . Wears hearing aid 09/25/2016  . Degeneration of lumbar or lumbosacral intervertebral disc 03/23/2015  . Orthostatic hypotension 03/27/2013  . Generalized muscle ache 05/28/2012  . Vitamin D deficiency 11/03/2010  . DIVERTICULOSIS, COLON 11/03/2010  . Osteoarthritis (arthritis due to wear and tear of joints) 11/03/2010  . Hyperlipidemia 12/01/2009  . HYPERTENSION, BENIGN 12/01/2009  . CAD, ARTERY BYPASS GRAFT 12/01/2009  . Type 2 diabetes mellitus with neurological manifestations, controlled (HCC)     Current Outpatient Medications on File Prior to Visit  Medication Sig Dispense Refill  . aspirin EC 81 MG tablet Take 81 mg by mouth daily.    Marland Kitchen atorvastatin (LIPITOR) 20 MG tablet TAKE 1 TABLET DAILY AT 6 P.M. 90 tablet 1  . carvedilol (COREG)  6.25 MG tablet TAKE 1 TABLET TWICE A DAY WITH MEALS 180 tablet 3  . cholecalciferol (VITAMIN D) 1000 UNITS tablet Take 1,000 Units by mouth daily.    Marland Kitchen HYDROcodone-acetaminophen (NORCO/VICODIN) 5-325 MG per tablet Take 1 tablet by mouth every 6 (six) hours as needed for moderate pain.     . meloxicam (MOBIC) 15 MG tablet 15 mg.    . metFORMIN (GLUCOPHAGE-XR) 500 MG 24 hr tablet TAKE 1 TABLET TWICE A DAY 180 tablet 2  . Multiple Vitamin (MULTIVITAMIN WITH MINERALS) TABS tablet Take 1 tablet by mouth daily.    . niacin (NIASPAN) 1000 MG CR tablet Take 1 tablet (1,000 mg total) by mouth at bedtime. 90 tablet 3  . warfarin (COUMADIN) 5 MG tablet Take as directed by the coumadin clinic. 30 tablet 2   No current facility-administered medications on file prior to visit.     Past Medical History:  Diagnosis Date  . Colonic polyp 2011 last colo   colo q 53yr - [facility]   . Coronary artery disease 02/2009   severe left main and three-vessel, CABG  . DEGENERATIVE JOINT DISEASE   . Diverticulosis of colon   . DIVERTICULOSIS, COLON   . HYPERLIPIDEMIA-MIXED   . HYPERTENSION, BENIGN   . LBP (low back pain)    s/p decompression 8/14; ESI - [place]; RFA x 3 early 2015  . Pacemaker    a. 05/2017: St. Jude (serial number [id number]) pacemaker  . Symptomatic bradycardia    a. s/p St. Jude (serial number  [id number]) pacemaker 06/16/17  . Type II or unspecified type diabetes mellitus without mention of complication, not stated as uncontrolled   . VITAMIN D DEFICIENCY     Past Surgical History:  Procedure Laterality Date  . CARPAL TUNNEL RELEASE Right   . CATARACT EXTRACTION, BILATERAL     R 07/07/13, L 07/21/13  . CORONARY ARTERY BYPASS GRAFT  03/17/09   x 4  . INGUINAL HERNIA REPAIR  1980  . KNEE ARTHROSCOPY  2007   right  . LUMBAR EPIDURAL INJECTION  2013   Has as needed.  . LUMBAR LAMINECTOMY/DECOMPRESSION MICRODISCECTOMY N/A 06/19/2013   Procedure: LUMBAR LAMINECTOMY/DECOMPRESSION MICRODISCECTOMY;   Surgeon: Emilee HeroMark Leonard Dumonski, MD;  [facility];  Service: Orthopedics;  Laterality: N/A;  Lumbar 4-5 decompression  . PACEMAKER IMPLANT N/A 06/16/2017   Procedure: Pacemaker Implant;  Surgeon: Marinus Mawaylor, Gregg W, MD;  [facility];  Service: Cardiovascular;  Laterality: N/A;  . TRIGGER FINGER RELEASE Right     Social History   Socioeconomic History  . Marital status: Married    Spouse name: Not on file  . Number of children: Not on file  . Years of education: Not on file  . Highest education level: Not on file  Occupational History  . Not on file  Social Needs  . Financial resource strain: Not on file  . Food insecurity:    Worry: Not on file    Inability: Not on file  . Transportation needs:    Medical: Not on file    Non-medical: Not on file  Tobacco Use  . Smoking status: Never Smoker  . Smokeless tobacco: Never Used  . Tobacco comment: Married, 3 grown children. Retired 04/2002 from [employer] work  Substance and Sexual Activity  . Alcohol use: Yes    Comment: RARE  . Drug use: No  . Sexual activity: Not on file  Lifestyle  . Physical activity:    Days per week: Not on file    Minutes per session: Not on file  . Stress: Not on file  Relationships  . Social connections:    Talks on phone: Not on file    Gets together: Not on file    Attends religious service: Not on file    Active member of club or organization: Not on file    Attends meetings of clubs or organizations: Not on file    Relationship status: Not on file  Other Topics Concern  . Not on file  Social History Narrative  . Not on file    Family History  Problem Relation Age of Onset  . Heart failure Mother        died age 82  . Heart failure Father        died age 82  . Diabetes Other   . Heart disease Neg Hx     Review of Systems  Constitutional: Negative for chills and fever.  Respiratory: Positive for cough (residual from recent URI). Negative for shortness  of breath and wheezing.   Cardiovascular: Positive for leg swelling (left leg chronically swollen from previous dvt  -no change). Negative for chest pain and palpitations.  Neurological: Negative for light-headedness and headaches.       Objective:   Vitals:   03/28/18 0806  BP: 100/62  Pulse: 76  Resp: 16  Temp: 98.2 F (36.8 C)  SpO2: 97%   BP Readings from Last 3 Encounters:  03/28/18 100/62  09/27/17 124/74  09/17/17 132/84  Wt Readings from Last 3 Encounters:  03/28/18 178 lb (80.7 kg)  09/27/17 187 lb (84.8 kg)  09/17/17 188 lb 3.2 oz (85.4 kg)   Body mass index is 27.06 kg/m.   Physical Exam    Constitutional: Appears well-developed and well-nourished. No distress.  HENT:  Head: Normocephalic and atraumatic.  Neck: Neck supple. No tracheal deviation present. No thyromegaly present.  No cervical lymphadenopathy Cardiovascular: Normal rate, regular rhythm and normal heart sounds.   2/6 systolic murmur heard. No carotid bruit .  Left leg edema - mild, non pitting Pulmonary/Chest: Effort normal and breath sounds normal. No respiratory distress. No has no wheezes. No rales.  Skin: Skin is warm and dry. Not diaphoretic.  Psychiatric: Normal mood and affect. Behavior is normal.      Assessment & Plan:    See Problem List for Assessment and Plan of chronic medical problems.

## 2018-03-27 NOTE — Patient Instructions (Addendum)
  Test(s) ordered today. Your results will be released to MyChart (or called to you) after review, usually within 72hours after test completion. If any changes need to be made, you will be notified at that same time.  Medications reviewed and updated.  No changes recommended at this time.    Please followup in 6 months   

## 2018-03-28 ENCOUNTER — Ambulatory Visit (INDEPENDENT_AMBULATORY_CARE_PROVIDER_SITE_OTHER): Payer: Medicare Other | Admitting: Internal Medicine

## 2018-03-28 ENCOUNTER — Encounter: Payer: Self-pay | Admitting: Internal Medicine

## 2018-03-28 ENCOUNTER — Other Ambulatory Visit (INDEPENDENT_AMBULATORY_CARE_PROVIDER_SITE_OTHER): Payer: Medicare Other

## 2018-03-28 VITALS — BP 100/62 | HR 76 | Temp 98.2°F | Resp 16 | Wt 178.0 lb

## 2018-03-28 DIAGNOSIS — E7849 Other hyperlipidemia: Secondary | ICD-10-CM

## 2018-03-28 DIAGNOSIS — E1149 Type 2 diabetes mellitus with other diabetic neurological complication: Secondary | ICD-10-CM

## 2018-03-28 DIAGNOSIS — I10 Essential (primary) hypertension: Secondary | ICD-10-CM | POA: Diagnosis not present

## 2018-03-28 LAB — CBC WITH DIFFERENTIAL/PLATELET
BASOS ABS: 0 10*3/uL (ref 0.0–0.1)
Basophils Relative: 0.6 % (ref 0.0–3.0)
Eosinophils Absolute: 0.2 10*3/uL (ref 0.0–0.7)
Eosinophils Relative: 3.4 % (ref 0.0–5.0)
HEMATOCRIT: 37.1 % — AB (ref 39.0–52.0)
HEMOGLOBIN: 12.7 g/dL — AB (ref 13.0–17.0)
LYMPHS PCT: 19.9 % (ref 12.0–46.0)
Lymphs Abs: 1.2 10*3/uL (ref 0.7–4.0)
MCHC: 34.1 g/dL (ref 30.0–36.0)
MCV: 98.8 fl (ref 78.0–100.0)
MONOS PCT: 10.2 % (ref 3.0–12.0)
Monocytes Absolute: 0.6 10*3/uL (ref 0.1–1.0)
NEUTROS ABS: 4 10*3/uL (ref 1.4–7.7)
Neutrophils Relative %: 65.9 % (ref 43.0–77.0)
Platelets: 200 10*3/uL (ref 150.0–400.0)
RBC: 3.75 Mil/uL — AB (ref 4.22–5.81)
RDW: 14.6 % (ref 11.5–15.5)
WBC: 6 10*3/uL (ref 4.0–10.5)

## 2018-03-28 LAB — COMPREHENSIVE METABOLIC PANEL
ALT: 17 U/L (ref 0–53)
AST: 16 U/L (ref 0–37)
Albumin: 4 g/dL (ref 3.5–5.2)
Alkaline Phosphatase: 54 U/L (ref 39–117)
BUN: 29 mg/dL — ABNORMAL HIGH (ref 6–23)
CALCIUM: 9.2 mg/dL (ref 8.4–10.5)
CHLORIDE: 107 meq/L (ref 96–112)
CO2: 26 meq/L (ref 19–32)
Creatinine, Ser: 1.38 mg/dL (ref 0.40–1.50)
GFR: 52.46 mL/min — ABNORMAL LOW (ref >60.00)
Glucose, Bld: 172 mg/dL — ABNORMAL HIGH (ref 70–99)
Potassium: 4.1 mEq/L (ref 3.5–5.1)
Sodium: 141 mEq/L (ref 135–145)
Total Bilirubin: 0.5 mg/dL (ref 0.2–1.2)
Total Protein: 6.7 g/dL (ref 6.0–8.3)

## 2018-03-28 LAB — LIPID PANEL
CHOL/HDL RATIO: 3
Cholesterol: 127 mg/dL (ref 0–200)
HDL: 42.5 mg/dL (ref >39.00)
LDL CALC: 51 mg/dL (ref 0–99)
NONHDL: 84.85
Triglycerides: 169 mg/dL — ABNORMAL HIGH (ref 0.0–149.0)
VLDL: 33.8 mg/dL (ref 0.0–40.0)

## 2018-03-28 LAB — HEMOGLOBIN A1C: Hgb A1c MFr Bld: 6.8 % — ABNORMAL HIGH (ref 4.6–6.5)

## 2018-03-28 NOTE — Assessment & Plan Note (Signed)
BP well controlled Current regimen effective and well tolerated Continue current medications at current doses cmp  

## 2018-03-28 NOTE — Assessment & Plan Note (Addendum)
Check lipid panel  Continue daily statin, niaspan Regular exercise and healthy diet encouraged

## 2018-03-28 NOTE — Assessment & Plan Note (Signed)
a1c Continue regular exercise continue metformin Eye exams up to date

## 2018-03-29 ENCOUNTER — Encounter: Payer: Self-pay | Admitting: Internal Medicine

## 2018-04-03 ENCOUNTER — Ambulatory Visit (INDEPENDENT_AMBULATORY_CARE_PROVIDER_SITE_OTHER): Payer: Medicare Other

## 2018-04-03 DIAGNOSIS — Z5181 Encounter for therapeutic drug level monitoring: Secondary | ICD-10-CM

## 2018-04-03 DIAGNOSIS — I82409 Acute embolism and thrombosis of unspecified deep veins of unspecified lower extremity: Secondary | ICD-10-CM | POA: Diagnosis not present

## 2018-04-03 LAB — POCT INR: INR: 2.3 (ref 2.0–3.0)

## 2018-04-03 NOTE — Patient Instructions (Signed)
Please continue previous dosage of dose of 2.5 mg daily except 5 mg on Mondays, Wednesdays and Fridays. Recheck in 4 weeks.

## 2018-04-09 LAB — CUP PACEART REMOTE DEVICE CHECK
Battery Remaining Longevity: 124 mo
Battery Remaining Percentage: 95.5 %
Battery Voltage: 3.01 V
Brady Statistic AP VS Percent: 1 %
Brady Statistic AS VS Percent: 1 %
Brady Statistic RV Percent Paced: 99 %
Date Time Interrogation Session: 20190528073328
Implantable Lead Implant Date: 20180825
Implantable Pulse Generator Implant Date: 20180825
Lead Channel Impedance Value: 490 Ohm
Lead Channel Pacing Threshold Amplitude: 0.5 V
Lead Channel Pacing Threshold Amplitude: 0.5 V
Lead Channel Pacing Threshold Pulse Width: 0.5 ms
Lead Channel Pacing Threshold Pulse Width: 0.5 ms
Lead Channel Sensing Intrinsic Amplitude: 3.4 mV
Lead Channel Setting Pacing Pulse Width: 0.5 ms
Lead Channel Setting Sensing Sensitivity: 4 mV
MDC IDC LEAD IMPLANT DT: 20180825
MDC IDC LEAD LOCATION: 753859
MDC IDC LEAD LOCATION: 753860
MDC IDC MSMT LEADCHNL RV IMPEDANCE VALUE: 790 Ohm
MDC IDC MSMT LEADCHNL RV SENSING INTR AMPL: 6.4 mV
MDC IDC PG SERIAL: 8939923
MDC IDC SET LEADCHNL RA PACING AMPLITUDE: 2 V
MDC IDC SET LEADCHNL RV PACING AMPLITUDE: 0.75 V
MDC IDC STAT BRADY AP VP PERCENT: 26 %
MDC IDC STAT BRADY AS VP PERCENT: 74 %
MDC IDC STAT BRADY RA PERCENT PACED: 25 %
Pulse Gen Model: 2272

## 2018-05-01 ENCOUNTER — Ambulatory Visit (INDEPENDENT_AMBULATORY_CARE_PROVIDER_SITE_OTHER): Payer: Medicare Other | Admitting: Pharmacist

## 2018-05-01 DIAGNOSIS — Z5181 Encounter for therapeutic drug level monitoring: Secondary | ICD-10-CM | POA: Diagnosis not present

## 2018-05-01 DIAGNOSIS — I82409 Acute embolism and thrombosis of unspecified deep veins of unspecified lower extremity: Secondary | ICD-10-CM

## 2018-05-01 LAB — POCT INR: INR: 2.7 (ref 2.0–3.0)

## 2018-05-01 NOTE — Patient Instructions (Signed)
Description   Please continue previous dosage of dose of 2.5 mg daily except 5 mg on Mondays, Wednesdays and Fridays. Recheck in 5 weeks.

## 2018-06-03 ENCOUNTER — Ambulatory Visit (INDEPENDENT_AMBULATORY_CARE_PROVIDER_SITE_OTHER): Payer: Medicare Other

## 2018-06-03 DIAGNOSIS — Z5181 Encounter for therapeutic drug level monitoring: Secondary | ICD-10-CM

## 2018-06-03 DIAGNOSIS — I82409 Acute embolism and thrombosis of unspecified deep veins of unspecified lower extremity: Secondary | ICD-10-CM | POA: Diagnosis not present

## 2018-06-03 LAB — POCT INR: INR: 2.2 (ref 2.0–3.0)

## 2018-06-03 NOTE — Patient Instructions (Signed)
Please continue previous dosage of dose of 2.5 mg daily except 5 mg on Mondays, Wednesdays and Fridays. Recheck in 6 weeks. 

## 2018-06-05 ENCOUNTER — Ambulatory Visit (INDEPENDENT_AMBULATORY_CARE_PROVIDER_SITE_OTHER): Payer: Medicare Other | Admitting: Internal Medicine

## 2018-06-05 ENCOUNTER — Encounter: Payer: Self-pay | Admitting: Internal Medicine

## 2018-06-05 VITALS — BP 124/62 | HR 63 | Ht 68.0 in | Wt 176.8 lb

## 2018-06-05 DIAGNOSIS — I442 Atrioventricular block, complete: Secondary | ICD-10-CM | POA: Diagnosis not present

## 2018-06-05 DIAGNOSIS — Z950 Presence of cardiac pacemaker: Secondary | ICD-10-CM

## 2018-06-05 LAB — CUP PACEART INCLINIC DEVICE CHECK
Battery Remaining Longevity: 124 mo
Battery Voltage: 3.01 V
Date Time Interrogation Session: 20190814131133
Implantable Lead Implant Date: 20180825
Implantable Lead Location: 753859
Implantable Lead Location: 753860
Implantable Pulse Generator Implant Date: 20180825
Lead Channel Pacing Threshold Amplitude: 0.5 V
Lead Channel Pacing Threshold Amplitude: 0.5 V
Lead Channel Pacing Threshold Pulse Width: 0.5 ms
Lead Channel Setting Pacing Amplitude: 0.75 V
Lead Channel Setting Pacing Pulse Width: 0.5 ms
Lead Channel Setting Sensing Sensitivity: 4 mV
MDC IDC LEAD IMPLANT DT: 20180825
MDC IDC MSMT LEADCHNL RA IMPEDANCE VALUE: 512.5 Ohm
MDC IDC MSMT LEADCHNL RA PACING THRESHOLD PULSEWIDTH: 0.5 ms
MDC IDC MSMT LEADCHNL RA SENSING INTR AMPL: 5 mV
MDC IDC MSMT LEADCHNL RV IMPEDANCE VALUE: 750 Ohm
MDC IDC SET LEADCHNL RA PACING AMPLITUDE: 2 V
MDC IDC STAT BRADY RA PERCENT PACED: 27 %
MDC IDC STAT BRADY RV PERCENT PACED: 99.7 %
Pulse Gen Model: 2272
Pulse Gen Serial Number: 8939923

## 2018-06-05 NOTE — Progress Notes (Signed)
HPI [name] returns today for ongoing evaluation and management of complete heart block status post pacemaker insertion. He is a very pleasant 84 year old man with a history of coronary artery disease status post bypass surgery, hypertension, diabetes, and dyslipidemia. He was found to be severely fatigued and dizzy and was noted to be in complete heart block and underwent permanent pacemaker insertion approximately 12 months ago. He has done well in the interim. His symptoms have resolved. Allergies  Allergen Reactions  . Penicillins Other (See Comments)    Taste buds     Current Outpatient Medications  Medication Sig Dispense Refill  . aspirin EC 81 MG tablet Take 81 mg by mouth daily.    Marland Kitchen. atorvastatin (LIPITOR) 20 MG tablet TAKE 1 TABLET DAILY AT 6 P.M. 90 tablet 1  . carvedilol (COREG) 6.25 MG tablet TAKE 1 TABLET TWICE A DAY WITH MEALS 180 tablet 3  . cholecalciferol (VITAMIN D) 1000 UNITS tablet Take 1,000 Units by mouth daily.    Marland Kitchen. HYDROcodone-acetaminophen (NORCO/VICODIN) 5-325 MG per tablet Take 1 tablet by mouth every 6 (six) hours as needed for moderate pain.     . meloxicam (MOBIC) 15 MG tablet 15 mg.    . metFORMIN (GLUCOPHAGE-XR) 500 MG 24 hr tablet TAKE 1 TABLET TWICE A DAY 180 tablet 2  . Multiple Vitamin (MULTIVITAMIN WITH MINERALS) TABS tablet Take 1 tablet by mouth daily.    . niacin (NIASPAN) 1000 MG CR tablet Take 1 tablet (1,000 mg total) by mouth at bedtime. 90 tablet 3  . warfarin (COUMADIN) 5 MG tablet Take as directed by the coumadin clinic. 30 tablet 2   No current facility-administered medications for this visit.      Past Medical History:  Diagnosis Date  . Colonic polyp 2011 last colo   colo q 1780yr - [facility]   . Coronary artery disease 02/2009   severe left main and three-vessel, CABG  . DEGENERATIVE JOINT DISEASE   . Diverticulosis of colon   . DIVERTICULOSIS, COLON   . HYPERLIPIDEMIA-MIXED   . HYPERTENSION, BENIGN   . LBP (low back pain)     s/p decompression 8/14; ESI - [place]; RFA x 3 early 2015  . Pacemaker    a. 05/2017: St. Jude (serial number [id number]) pacemaker  . Symptomatic bradycardia    a. s/p St. Jude (serial number [id number]) pacemaker 06/16/17  . Type II or unspecified type diabetes mellitus without mention of complication, not stated as uncontrolled   . VITAMIN D DEFICIENCY     ROS:   All systems reviewed and negative except as noted in the HPI.   Past Surgical History:  Procedure Laterality Date  . CARPAL TUNNEL RELEASE Right   . CATARACT EXTRACTION, BILATERAL     R 07/07/13, L 07/21/13  . CORONARY ARTERY BYPASS GRAFT  03/17/09   x 4  . INGUINAL HERNIA REPAIR  1980  . KNEE ARTHROSCOPY  2007   right  . LUMBAR EPIDURAL INJECTION  2013   Has as needed.  . LUMBAR LAMINECTOMY/DECOMPRESSION MICRODISCECTOMY N/A 06/19/2013   Procedure: LUMBAR LAMINECTOMY/DECOMPRESSION MICRODISCECTOMY;  Surgeon: Emilee HeroMark Leonard Dumonski, MD;  [facility];  Service: Orthopedics;  Laterality: N/A;  Lumbar 4-5 decompression  . PACEMAKER IMPLANT N/A 06/16/2017   Procedure: Pacemaker Implant;  Surgeon: Marinus Mawaylor, Gregg W, MD;  [facility];  Service: Cardiovascular;  Laterality: N/A;  . TRIGGER FINGER RELEASE Right      Family History  Problem Relation Age of  Onset  . Heart failure Mother        died age 580  . Heart failure Father        died age 196  . Diabetes Other   . Heart disease Neg Hx      Social History   Socioeconomic History  . Marital status: Married    Spouse name: Not on file  . Number of children: Not on file  . Years of education: Not on file  . Highest education level: Not on file  Occupational History  . Not on file  Social Needs  . Financial resource strain: Not on file  . Food insecurity:    Worry: Not on file    Inability: Not on file  . Transportation needs:    Medical: Not on file    Non-medical: Not on file  Tobacco Use  . Smoking status: Never Smoker  . Smokeless tobacco:  Never Used  . Tobacco comment: Married, 3 grown children. Retired 04/2002 from [employer] work  Substance and Sexual Activity  . Alcohol use: Yes    Comment: RARE  . Drug use: No  . Sexual activity: Not on file  Lifestyle  . Physical activity:    Days per week: Not on file    Minutes per session: Not on file  . Stress: Not on file  Relationships  . Social connections:    Talks on phone: Not on file    Gets together: Not on file    Attends religious service: Not on file    Active member of club or organization: Not on file    Attends meetings of clubs or organizations: Not on file    Relationship status: Not on file  . Intimate partner violence:    Fear of current or ex partner: Not on file    Emotionally abused: Not on file    Physically abused: Not on file    Forced sexual activity: Not on file  Other Topics Concern  . Not on file  Social History Narrative  . Not on file     BP 124/62   Pulse 63   Ht 5\' 8"  (1.727 m)   Wt 176 lb 12.8 oz (80.2 kg)   SpO2 95%   BMI 26.88 kg/m   Physical Exam:  Well appearing NAD HEENT: Unremarkable Neck:  No JVD, no thyromegally Lymphatics:  No adenopathy Back:  No CVA tenderness Lungs:  Clear HEART:  Regular rate rhythm, no murmurs, no rubs, no clicks Abd:  soft, positive bowel sounds, no organomegally, no rebound, no guarding Ext:  2 plus pulses, no edema, no cyanosis, no clubbing Skin:  No rashes no nodules Neuro:  CN II through XII intact, motor grossly intact  EKG - nsr with ventricular pacing  DEVICE  Normal device function.  See PaceArt for details.   Assess/Plan: 1. CHB - he is asymptomatic, s/p PPM. He is paced at 30/min today. 2. PPM - his St. Jude DDD PM is working normally.  3. HTN - his blood pressure is well controlled. No change in his meds.  Leonia ReevesGregg Taylor,M.D.

## 2018-06-05 NOTE — Patient Instructions (Signed)
Medication Instructions:  Your physician recommends that you continue on your current medications as directed. Please refer to the Current Medication list given to you today.  Labwork: None ordered.  Testing/Procedures: None ordered.  Follow-Up: Your physician wants you to follow-up in: one year with Dr. Ladona Ridgelaylor.   You will receive a reminder letter in the mail two months in advance. If you don't receive a letter, please call our office to schedule the follow-up appointment.  Remote monitoring is used to monitor your Pacemaker from home. This monitoring reduces the number of office visits required to check your device to one time per year. It allows us to keep an eye on the functioning of your device to ensure it is working properly. You are scheduled for a device check from home on 06/18/2018. You may send your transmission at any time that day. If you have a wireless device, the transmission will be sent automatically. After your physician reviews your transmission, you will receive a postcard with your next transmission date.  Any Other Special Instructions Will Be Listed Below (If Applicable).  If you need a refill on your cardiac medications before your next appointment, please call your pharmacy.

## 2018-06-10 ENCOUNTER — Telehealth: Payer: Self-pay | Admitting: Cardiovascular Disease

## 2018-06-10 MED ORDER — WARFARIN SODIUM 5 MG PO TABS: ORAL_TABLET | ORAL | 2 refills | Status: DC

## 2018-06-10 NOTE — Telephone Encounter (Signed)
° °  [facility] Pre-operative Risk Assessment    Request for surgical clearance:  1. What type of surgery is being performed? Lumbar Facet Injection, Bilat L5-51   2. When is this surgery scheduled? TBD   3. What type of clearance is required (medical clearance vs. Pharmacy clearance to hold med vs. Both)? pharmacy  4. Are there any medications that need to be held prior to surgery and how long? warfarin   5. Practice name and name of physician performing surgery? [facility], Laroy Apple  6. What is your office phone number [phone]    7.   What is your office fax number [phone]  8.   Anesthesia type (None, local, MAC, general) ?    Ace Gins 06/10/2018, 7:18 AM  _________________________________________________________________   (provider comments below)

## 2018-06-10 NOTE — Telephone Encounter (Signed)
°*  STAT* If patient is at the pharmacy, call can be transferred to refill team.   1. Which medications need to be refilled? (please list name of each medication and dose if known) warfarin (COUMADIN) 5 MG   2. Which pharmacy/location (including street and city if local pharmacy) is medication to be sent to? [facility] on [address] in [place]  3. Do they need a 30 day or 90 day supply? 90 day

## 2018-06-10 NOTE — Telephone Encounter (Signed)
Refill Request.  

## 2018-06-10 NOTE — Telephone Encounter (Signed)
Patient is on Coumadin and ASA. Has pacemaker as well. Last saw Dr Mariah MillingGollan 07/2017 with f/u in 1 year. Saw Dr Ladona Ridgelaylor 05/2018. Has scheduled f/u with Dr Mariah MillingGollan on 07/26/18. Routing to Dr Mariah MillingGollan.

## 2018-06-11 NOTE — Telephone Encounter (Signed)
How many days do they need off warfarin 5? 7? Ok to hold the warfarin Stay on asa 81 mg daily restrat warfarin after the procedure Would wear compression hose when off warfarin

## 2018-06-12 NOTE — Telephone Encounter (Signed)
Andrew [name] from [facility] called back. She asked to disregard original request as they have decided to do a Facet injection instead which does not require clearance. Nothing else needed from us at this time.

## 2018-06-12 NOTE — Telephone Encounter (Signed)
S/w Cordelia PenSherry. She will ask Dr. Maurice SmallIbazebo how long the warfarin will need to be held prior to injection and let us know the response.

## 2018-06-18 ENCOUNTER — Ambulatory Visit (INDEPENDENT_AMBULATORY_CARE_PROVIDER_SITE_OTHER): Payer: Medicare Other | Admitting: *Deleted

## 2018-06-18 DIAGNOSIS — I442 Atrioventricular block, complete: Secondary | ICD-10-CM | POA: Diagnosis not present

## 2018-06-18 NOTE — Progress Notes (Signed)
Remote pacemaker transmission.   

## 2018-06-20 ENCOUNTER — Encounter: Payer: Self-pay | Admitting: Cardiology

## 2018-07-05 ENCOUNTER — Ambulatory Visit (INDEPENDENT_AMBULATORY_CARE_PROVIDER_SITE_OTHER): Payer: Medicare Other | Admitting: Podiatry

## 2018-07-05 ENCOUNTER — Ambulatory Visit: Payer: Self-pay

## 2018-07-05 VITALS — BP 111/61 | HR 68

## 2018-07-05 DIAGNOSIS — E1142 Type 2 diabetes mellitus with diabetic polyneuropathy: Secondary | ICD-10-CM

## 2018-07-05 DIAGNOSIS — E119 Type 2 diabetes mellitus without complications: Secondary | ICD-10-CM | POA: Diagnosis not present

## 2018-07-05 DIAGNOSIS — L601 Onycholysis: Secondary | ICD-10-CM

## 2018-07-05 DIAGNOSIS — B351 Tinea unguium: Secondary | ICD-10-CM

## 2018-07-05 DIAGNOSIS — I82402 Acute embolism and thrombosis of unspecified deep veins of left lower extremity: Secondary | ICD-10-CM

## 2018-07-05 DIAGNOSIS — D689 Coagulation defect, unspecified: Secondary | ICD-10-CM

## 2018-07-05 NOTE — Progress Notes (Signed)
Subjective:  [name], male    DOB: 19-Jul-1934,  MRN: [id number]  Chief Complaint  Patient presents with  . Toe Injury     NP/LT FOOT GREAT TOE/DROPPED FIRE WOOD ON FOOT    84 y.o. male presents  for diabetic foot care. Last AMBS was unknown. Reports numbness and tingling in their feet. Denies cramping in legs and thighs.  States that the left great toenail has been thick for some time. Dropped a piece of wood on it yesterday and it became swollen and started draining.  Review of Systems: Negative except as noted in the HPI. Denies N/V/F/Ch.  Past Medical History:  Diagnosis Date  . Colonic polyp 2011 last colo   colo q 66yr - [facility]   . Coronary artery disease 02/2009   severe left main and three-vessel, CABG  . DEGENERATIVE JOINT DISEASE   . Diverticulosis of colon   . DIVERTICULOSIS, COLON   . HYPERLIPIDEMIA-MIXED   . HYPERTENSION, BENIGN   . LBP (low back pain)    s/p decompression 8/14; ESI - [place]; RFA x 3 early 2015  . Pacemaker    a. 05/2017: St. Jude (serial number [id number]) pacemaker  . Symptomatic bradycardia    a. s/p St. Jude (serial number [id number]) pacemaker 06/16/17  . Type II or unspecified type diabetes mellitus without mention of complication, not stated as uncontrolled   . VITAMIN D DEFICIENCY     Current Outpatient Medications:  .  aspirin EC 81 MG tablet, Take 81 mg by mouth daily., Disp: , Rfl:  .  atorvastatin (LIPITOR) 20 MG tablet, TAKE 1 TABLET DAILY AT 6 P.M., Disp: 90 tablet, Rfl: 1 .  carvedilol (COREG) 6.25 MG tablet, TAKE 1 TABLET TWICE A DAY WITH MEALS, Disp: 180 tablet, Rfl: 3 .  cholecalciferol (VITAMIN D) 1000 UNITS tablet, Take 1,000 Units by mouth daily., Disp: , Rfl:  .  HYDROcodone-acetaminophen (NORCO/VICODIN) 5-325 MG per tablet, Take 1 tablet by mouth every 6 (six) hours as needed for moderate pain. , Disp: , Rfl:  .  meloxicam (MOBIC) 15 MG tablet, 15 mg., Disp: , Rfl:  .  metFORMIN (GLUCOPHAGE-XR) 500 MG 24 hr  tablet, TAKE 1 TABLET TWICE A DAY, Disp: 180 tablet, Rfl: 2 .  Multiple Vitamin (MULTIVITAMIN WITH MINERALS) TABS tablet, Take 1 tablet by mouth daily., Disp: , Rfl:  .  niacin (NIASPAN) 1000 MG CR tablet, Take 1 tablet (1,000 mg total) by mouth at bedtime., Disp: 90 tablet, Rfl: 3 .  warfarin (COUMADIN) 5 MG tablet, Take as directed by the coumadin clinic., Disp: 30 tablet, Rfl: 2  Social History   Tobacco Use  Smoking Status Never Smoker  Smokeless Tobacco Never Used  Tobacco Comment   Married, 3 grown children. Retired 04/2002 from [employer] work    Allergies  Allergen Reactions  . Penicillins Other (See Comments)    Taste buds   Objective:   Vitals:   07/05/18 0816  BP: 111/61  Pulse: 68   There is no height or weight on file to calculate BMI. Constitutional Well developed. Well nourished.  Vascular Dorsalis pedis pulses present 1+ bilaterally  Posterior tibial pulses absent bilaterally  Pedal hair growth absent. Capillary refill normal to all digits.  No cyanosis or clubbing noted.  Neurologic Normal speech. Oriented to person, place, and time. Epicritic sensation to light touch grossly present bilaterally. Protective sensation with 5.07 monofilament  absent bilaterally. Vibratory sensation absent bilaterally.  Dermatologic Nails elongated, thickened, dystrophic. L  hallux nail lysed with blistering of the proximal nail fold with serous drainage. No warmth erythema or signs of infection. No skin lesions.  Orthopedic: Normal joint ROM without pain or crepitus bilaterally. No visible deformities. No bony tenderness.   Assessment:   1. Onycholysis   2. DM type 2 with diabetic peripheral neuropathy (HCC)   3. Onychomycosis   4. Coagulation defect (HCC)   5. Deep vein thrombosis (DVT) of left lower extremity, unspecified chronicity, unspecified vein (HCC)   6. Encounter for diabetic foot exam [facility](HCC)    Plan:  Patient was evaluated and treated and  all questions answered.  Diabetes with DPN, Onychomycosis; Coagulation defect 2/2 coumadin from chronic DVT. -Educated on diabetic footcare. Diabetic risk level 1 -Nails x10 debrided sharply and manually with large nail nipper and rotary burr.   Procedure: Nail Debridement Rationale: Patient meets criteria for routine foot care due to PAD Type of Debridement: manual, sharp debridement. Instrumentation: Nail nipper, rotary burr. Number of Nails: 9   Traumatic Onycholysis of Toenail -Removal indicated today. -Discussed due to comorbidities he is more likely to experience delayed healing. Patient verbalized understanding.  Procedure: Avulsion of toenail Location: Left 1st toe  Anesthesia: Lidocaine 1% plain; 1.5 mL and Marcaine 0.5% plain; 1.5 mL, digital block. Skin Prep: Betadine. Dressing: Silvadene; telfa; dry, sterile, compression dressing. Technique: Following skin prep, the toe was exsanguinated and a tourniquet was secured at the base of the toe. The nail was freed and avulsed with a hemostat. The area was cleansed. The tourniquet was then removed and sterile dressing applied. Disposition: Patient tolerated procedure well.    Return in about 2 weeks (around 07/19/2018) for Nail Check, Left.

## 2018-07-05 NOTE — Patient Instructions (Signed)

## 2018-07-15 ENCOUNTER — Ambulatory Visit (INDEPENDENT_AMBULATORY_CARE_PROVIDER_SITE_OTHER): Payer: Medicare Other

## 2018-07-15 DIAGNOSIS — I82409 Acute embolism and thrombosis of unspecified deep veins of unspecified lower extremity: Secondary | ICD-10-CM | POA: Diagnosis not present

## 2018-07-15 DIAGNOSIS — Z5181 Encounter for therapeutic drug level monitoring: Secondary | ICD-10-CM | POA: Diagnosis not present

## 2018-07-15 LAB — POCT INR: INR: 2.7 (ref 2.0–3.0)

## 2018-07-15 NOTE — Patient Instructions (Signed)
Please continue previous dosage of dose of 2.5 mg daily except 5 mg on Mondays, Wednesdays and Fridays. Recheck in 6 weeks.

## 2018-07-19 ENCOUNTER — Ambulatory Visit: Payer: Medicare Other

## 2018-07-19 DIAGNOSIS — B351 Tinea unguium: Secondary | ICD-10-CM

## 2018-07-19 NOTE — Patient Instructions (Signed)

## 2018-07-19 NOTE — Progress Notes (Signed)
Patient is here today for follow-up appointment, procedure performed on 07/05/2018 left first toe nail avulsion.  He states that overall he is feeling well not having any pain in the area.  No erythema, no redness, no drainage, no swelling, no other signs and symptoms of infection.  Overall well-healing nail site, area is beginning to scab over.  Discussed signs and symptoms of infection.  Verbal and written instructions were given to the patient.  He is to follow-up with any acute symptom changes.

## 2018-07-22 LAB — CUP PACEART REMOTE DEVICE CHECK
Battery Voltage: 3.01 V
Brady Statistic AP VP Percent: 14 %
Brady Statistic AS VP Percent: 86 %
Brady Statistic RA Percent Paced: 14 %
Brady Statistic RV Percent Paced: 99 %
Implantable Lead Implant Date: 20180825
Implantable Lead Location: 753860
Lead Channel Impedance Value: 690 Ohm
Lead Channel Pacing Threshold Amplitude: 0.5 V
Lead Channel Pacing Threshold Amplitude: 0.5 V
Lead Channel Pacing Threshold Pulse Width: 0.5 ms
Lead Channel Pacing Threshold Pulse Width: 0.5 ms
Lead Channel Sensing Intrinsic Amplitude: 6.4 mV
Lead Channel Setting Pacing Amplitude: 0.75 V
Lead Channel Setting Pacing Amplitude: 2 V
Lead Channel Setting Pacing Pulse Width: 0.5 ms
Lead Channel Setting Sensing Sensitivity: 4 mV
MDC IDC LEAD IMPLANT DT: 20180825
MDC IDC LEAD LOCATION: 753859
MDC IDC MSMT BATTERY REMAINING LONGEVITY: 122 mo
MDC IDC MSMT BATTERY REMAINING PERCENTAGE: 95.5 %
MDC IDC MSMT LEADCHNL RA IMPEDANCE VALUE: 440 Ohm
MDC IDC MSMT LEADCHNL RA SENSING INTR AMPL: 3.1 mV
MDC IDC PG IMPLANT DT: 20180825
MDC IDC PG SERIAL: 8939923
MDC IDC SESS DTM: 20190827041823
MDC IDC STAT BRADY AP VS PERCENT: 1 %
MDC IDC STAT BRADY AS VS PERCENT: 1 %

## 2018-07-26 ENCOUNTER — Ambulatory Visit: Payer: Medicare Other | Admitting: Cardiovascular Disease

## 2018-08-11 ENCOUNTER — Other Ambulatory Visit: Payer: Self-pay | Admitting: Internal Medicine

## 2018-08-25 NOTE — Progress Notes (Signed)
Cardiology Office Note  Date:  08/26/2018   ID:  Andrew [name], DOB 01-14-1934, MRN [id number]  PCP:  Pincus Sanes, MD   Chief Complaint  Patient presents with  . other    12 month f/u no complaints today. Meds reviewed verbally with pt.    HPI:  Andrew [name] is a very pleasant 84 year old gentleman with history of  coronary artery disease,  bypass in May 2010, Chronic hip and back pain, can't walk hx of near syncope  hyperlipidemia,  diabetes,  hypertension,  complete heart block 06/15/2017, pacer Prior DVT , on long-term anticoagulation, who presents for follow-up of his coronary artery disease  In follow-up today reports he continues to have significant arthrtis pain, Insurance will not cover celebrex, but did better on the medication Now on mobic, not working as well Limiting exercise  Denies any chest pain concerning for angina No regular exercise program Denies shortness of breath on ambulation  Previously tried Cialis and Viagra in the past for erectile dysfunction.  Perhaps willing to try them again  Previous total cholesterol 127, LDL 51 HAB1C 6.8  EKG personally reviewed by myself on todays visit Paced rhythm rate 60 bpm  Previous hospital records reviewed hospitalization for complete heart block 06/15/2017 Bradycardic heart rate in the 20 to 30s St. Jude pacemaker placed June 11 2017, by Dr. Ladona Ridgel  Stress test: 12/27/2016: no ischemia Results reviewed with him in detail  Event monitor 12/21/2016:  Results reviewed with him in detail Normal sinus rhythm Min HR of 43 bpm, max HR of 117 bpm, and avg HR of 72 bpm.  Rare APCs and PVCs likely causing symptoms No other significnat arrhythmia  History of a pinched nerve, back surgery 06/19/2013. He continues to have back pain, status post radiofrequency treatments x3 through [facility].      PMH:   has a past medical history of Colonic polyp (2011 last colo), Coronary artery disease  (02/2009), DEGENERATIVE JOINT DISEASE, Diverticulosis of colon, DIVERTICULOSIS, COLON, HYPERLIPIDEMIA-MIXED, HYPERTENSION, BENIGN, LBP (low back pain), Pacemaker, Symptomatic bradycardia, Type II or unspecified type diabetes mellitus without mention of complication, not stated as uncontrolled, and VITAMIN D DEFICIENCY.  PSH:    Past Surgical History:  Procedure Laterality Date  . CARPAL TUNNEL RELEASE Right   . CATARACT EXTRACTION, BILATERAL     R 07/07/13, L 07/21/13  . CORONARY ARTERY BYPASS GRAFT  03/17/09   x 4  . INGUINAL HERNIA REPAIR  1980  . KNEE ARTHROSCOPY  2007   right  . LUMBAR EPIDURAL INJECTION  2013   Has as needed.  . LUMBAR LAMINECTOMY/DECOMPRESSION MICRODISCECTOMY N/A 06/19/2013   Procedure: LUMBAR LAMINECTOMY/DECOMPRESSION MICRODISCECTOMY;  Surgeon: Emilee Hero, MD;  [facility];  Service: Orthopedics;  Laterality: N/A;  Lumbar 4-5 decompression  . PACEMAKER IMPLANT N/A 06/16/2017   Procedure: Pacemaker Implant;  Surgeon: Marinus Maw, MD;  Location: [facility] INVASIVE CV LAB;  Service: Cardiovascular;  Laterality: N/A;  . TRIGGER FINGER RELEASE Right     Current Outpatient Medications  Medication Sig Dispense Refill  . aspirin EC 81 MG tablet Take 81 mg by mouth daily.    Marland Kitchen atorvastatin (LIPITOR) 20 MG tablet TAKE 1 TABLET DAILY AT 6 P.M. 90 tablet 1  . carvedilol (COREG) 6.25 MG tablet TAKE 1 TABLET TWICE A DAY WITH MEALS 180 tablet 3  . cholecalciferol (VITAMIN D) 1000 UNITS tablet Take 1,000 Units by mouth daily.    Marland Kitchen HYDROcodone-acetaminophen (NORCO/VICODIN) 5-325 MG per tablet Take 1  tablet by mouth every 6 (six) hours as needed for moderate pain.     . meloxicam (MOBIC) 15 MG tablet Take 15 mg by mouth daily.     . metFORMIN (GLUCOPHAGE-XR) 500 MG 24 hr tablet TAKE 1 TABLET TWICE A DAY 180 tablet 0  . Multiple Vitamin (MULTIVITAMIN WITH MINERALS) TABS tablet Take 1 tablet by mouth daily.    . niacin (NIASPAN) 1000 MG CR tablet Take 1 tablet (1,000 mg  total) by mouth at bedtime. 90 tablet 3  . warfarin (COUMADIN) 5 MG tablet Take as directed by the coumadin clinic. 30 tablet 2   No current facility-administered medications for this visit.      Allergies:   Penicillins   Social History:  The patient  reports that he has never smoked. He has never used smokeless tobacco. He reports that he drinks alcohol. He reports that he does not use drugs.   Family History:   family history includes Diabetes in his other; Heart failure in his father and mother.    Review of Systems: Review of Systems  Constitutional: Negative.   Respiratory: Negative.   Cardiovascular: Negative.   Gastrointestinal: Negative.   Musculoskeletal: Positive for joint pain.  Neurological: Negative.   Psychiatric/Behavioral: Negative.   All other systems reviewed and are negative.   PHYSICAL EXAM: VS:  BP 132/84 (BP Location: Left Arm, Patient Position: Sitting, Cuff Size: Normal)   Pulse 60   Ht 5\' 8"  (1.727 m)   Wt 183 lb 8 oz (83.2 kg)   BMI 27.90 kg/m  , BMI Body mass index is 27.9 kg/m. Constitutional:  oriented to person, place, and time. No distress.  HENT:  Head: Grossly normal Eyes:  no discharge. No scleral icterus.  Neck: No JVD, no carotid bruits  Cardiovascular: Regular rate and rhythm, no murmurs appreciated Pulmonary/Chest: Clear to auscultation bilaterally, no wheezes or rails Abdominal: Soft.  no distension.  no tenderness.  Musculoskeletal: Normal range of motion Neurological:  normal muscle tone. Coordination normal. No atrophy Skin: Skin warm and dry Psychiatric: normal affect, pleasant  Recent Labs: 09/27/2017: TSH 1.20 03/28/2018: ALT 17; BUN 29; Creatinine, Ser 1.38; Hemoglobin 12.7; Platelets 200.0; Potassium 4.1; Sodium 141    Lipid Panel Lab Results  Component Value Date   CHOL 127 03/28/2018   HDL 42.50 03/28/2018   LDLCALC 51 03/28/2018   TRIG 169.0 (H) 03/28/2018      Wt Readings from Last 3 Encounters:  08/26/18  183 lb 8 oz (83.2 kg)  06/05/18 176 lb 12.8 oz (80.2 kg)  03/28/18 178 lb (80.7 kg)     ASSESSMENT AND PLAN:  Pure hypercholesterolemia -  Cholesterol at excellent level Stable  DVT dx with DVT on u/s on 10/2, on warfarin Etiology unclear  HYPERTENSION, BENIGN -  Blood pressure stable, no medication changes made  Atherosclerosis of coronary artery bypass graft of native heart with angina pectoris (HCC) -  Prior stress test with no ischemia He denies any symptoms concerning for angina No further work-up at this time  Type 2 diabetes mellitus with neurological manifestations, controlled (HCC) Unable to exercise Recommended low carbohydrate diet  Near syncope No recent episodes of orthostasis Blood pressure stable  Complete heart block,pacemaker Seen by Dr. Ladona Ridgel August 2019 Has annual follow-up  Erectile dysfunction He is willing to retry generic Viagra, prescription provided  Arthritis Found way for him to get cheaper Celebrex He will talk with primary care, see if he can change from his Mobic to Celebrex  He was on this previously and had better symptom control   Total encounter time more than 25 minutes  Greater than 50% was spent in counseling and coordination of care with the patient  Follow-up 12 months   Signed, Dossie Arbour, M.D., Ph.D. 08/26/2018  [facility] [place], [place] [phone]

## 2018-08-26 ENCOUNTER — Ambulatory Visit (INDEPENDENT_AMBULATORY_CARE_PROVIDER_SITE_OTHER): Payer: Medicare Other | Admitting: Cardiovascular Disease

## 2018-08-26 ENCOUNTER — Encounter: Payer: Self-pay | Admitting: Cardiovascular Disease

## 2018-08-26 ENCOUNTER — Ambulatory Visit (INDEPENDENT_AMBULATORY_CARE_PROVIDER_SITE_OTHER): Payer: Medicare Other

## 2018-08-26 VITALS — BP 132/84 | HR 60 | Ht 68.0 in | Wt 183.5 lb

## 2018-08-26 DIAGNOSIS — Z950 Presence of cardiac pacemaker: Secondary | ICD-10-CM

## 2018-08-26 DIAGNOSIS — E1149 Type 2 diabetes mellitus with other diabetic neurological complication: Secondary | ICD-10-CM

## 2018-08-26 DIAGNOSIS — I25708 Atherosclerosis of coronary artery bypass graft(s), unspecified, with other forms of angina pectoris: Secondary | ICD-10-CM

## 2018-08-26 DIAGNOSIS — I82409 Acute embolism and thrombosis of unspecified deep veins of unspecified lower extremity: Secondary | ICD-10-CM | POA: Diagnosis not present

## 2018-08-26 DIAGNOSIS — E782 Mixed hyperlipidemia: Secondary | ICD-10-CM

## 2018-08-26 DIAGNOSIS — I951 Orthostatic hypotension: Secondary | ICD-10-CM | POA: Diagnosis not present

## 2018-08-26 DIAGNOSIS — Z5181 Encounter for therapeutic drug level monitoring: Secondary | ICD-10-CM | POA: Diagnosis not present

## 2018-08-26 DIAGNOSIS — I442 Atrioventricular block, complete: Secondary | ICD-10-CM

## 2018-08-26 LAB — POCT INR: INR: 3.2 — AB (ref 2.0–3.0)

## 2018-08-26 MED ORDER — SILDENAFIL CITRATE 20 MG PO TABS: 20.0000 mg | ORAL_TABLET | Freq: Three times a day (TID) | ORAL | 3 refills | Status: DC | PRN

## 2018-08-26 NOTE — Patient Instructions (Addendum)
Medication Instructions:   Sildenafil 3 to 5 pills at a time , total of 60 to 100 mg  If you need a refill on your cardiac medications before your next appointment, please call your pharmacy.    Lab work: No new labs needed   If you have labs (blood work) drawn today and your tests are completely normal, you will receive your results only by: Marland Kitchen MyChart Message (if you have MyChart) OR . A paper copy in the mail If you have any lab test that is abnormal or we need to change your treatment, we will call you to review the results.   Testing/Procedures: No new testing needed   Follow-Up: At [facility], you and your health needs are our priority.  As part of our continuing mission to provide you with exceptional heart care, we have created designated Provider Care Teams.  These Care Teams include your primary Cardiologist (physician) and Advanced Practice Providers (APPs -  Physician Assistants and Nurse Practitioners) who all work together to provide you with the care you need, when you need it.  . You will need a follow up appointment in 12 months .   Please call our office 2 months in advance to schedule this appointment.    . Providers on your designated Care Team:   . Nicolasa Ducking, NP . Eula Listen, PA-C . Marisue Ivan, PA-C  Any Other Special Instructions Will Be Listed Below (If Applicable).  For educational health videos Log in to : [email or web address] Or : [email or web address], password : [email or web address]

## 2018-08-26 NOTE — Patient Instructions (Signed)
Please take 1/2 tablet tonight, have a serving of greens and continue previous dosage of dose of 2.5 mg daily except 5 mg on Mondays, Wednesdays and Fridays. Recheck in 5 weeks.

## 2018-09-03 ENCOUNTER — Other Ambulatory Visit: Payer: Self-pay | Admitting: Internal Medicine

## 2018-09-13 ENCOUNTER — Other Ambulatory Visit: Payer: Self-pay | Admitting: Cardiovascular Disease

## 2018-09-17 ENCOUNTER — Ambulatory Visit (INDEPENDENT_AMBULATORY_CARE_PROVIDER_SITE_OTHER): Payer: Medicare Other

## 2018-09-17 DIAGNOSIS — R001 Bradycardia, unspecified: Secondary | ICD-10-CM

## 2018-09-17 DIAGNOSIS — I442 Atrioventricular block, complete: Secondary | ICD-10-CM

## 2018-09-17 NOTE — Progress Notes (Signed)
Remote pacemaker transmission.   

## 2018-09-23 ENCOUNTER — Other Ambulatory Visit: Payer: Self-pay | Admitting: Internal Medicine

## 2018-09-29 NOTE — Assessment & Plan Note (Addendum)
History of DVT in left lower extremity On chronic warfarin therapy-managed by cardiology CBC

## 2018-09-29 NOTE — Progress Notes (Signed)
Subjective:    [name], male    DOB: [birth year], 84 y.o.   MRN: [id number]  HPI The patient is here for follow up.  Diabetes: He is taking his medication daily as prescribed. He is compliant with a diabetic diet. He is exercising regularly - yard work. He checks his feet daily and denies foot lesions. He is up-to-date with an ophthalmology examination.   Hypertension: He is taking his medication daily. He is compliant with a low sodium diet.  He denies chest pain, palpitations, edema (except for chronic swelling in left leg from previous DVT), shortness of breath and regular headaches. He is very active, but not exercising regularly.  He does not monitor his blood pressure at home.    Hyperlipidemia: He is taking his medication daily. He is compliant with a low fat/cholesterol diet. He is not exercising regularly. He denies myalgias.   Osteoarthritis:  He is taking meloxicam twice daily.  He wants to go back on celebrex, which worked better for him.  Cardiology is monitoring his warfarin and aware he is on ASA and an nsaids.    Medications and allergies reviewed with patient and updated if appropriate.  Patient Active Problem List   Diagnosis Date Noted  . DVT (deep venous thrombosis) (HCC) 08/28/2017  . Complete heart block (HCC) 06/15/2017  . Chronic fatigue 01/20/2017  . Near syncope 12/21/2016  . Wears hearing aid 09/25/2016  . Degeneration of lumbar or lumbosacral intervertebral disc 03/23/2015  . Orthostatic hypotension 03/27/2013  . Generalized muscle ache 05/28/2012  . Vitamin D deficiency 11/03/2010  . DIVERTICULOSIS, COLON 11/03/2010  . Osteoarthritis (arthritis due to wear and tear of joints) 11/03/2010  . Hyperlipidemia 12/01/2009  . HYPERTENSION, BENIGN 12/01/2009  . CAD, ARTERY BYPASS GRAFT 12/01/2009  . Type 2 diabetes mellitus with neurological manifestations, controlled (HCC)     Current Outpatient Medications on File Prior to Visit    Medication Sig Dispense Refill  . aspirin EC 81 MG tablet Take 81 mg by mouth daily.    Marland Kitchen atorvastatin (LIPITOR) 20 MG tablet TAKE 1 TABLET DAILY AT 6 P.M. 90 tablet 1  . carvedilol (COREG) 6.25 MG tablet TAKE 1 TABLET TWICE A DAY WITH MEALS 180 tablet 4  . cholecalciferol (VITAMIN D) 1000 UNITS tablet Take 1,000 Units by mouth daily.    Marland Kitchen HYDROcodone-acetaminophen (NORCO/VICODIN) 5-325 MG per tablet Take 1 tablet by mouth every 6 (six) hours as needed for moderate pain.     . metFORMIN (GLUCOPHAGE-XR) 500 MG 24 hr tablet TAKE 1 TABLET TWICE A DAY 180 tablet 0  . Multiple Vitamin (MULTIVITAMIN WITH MINERALS) TABS tablet Take 1 tablet by mouth daily.    . niacin (NIASPAN) 1000 MG CR tablet TAKE 1 TABLET AT BEDTIME 90 tablet 1  . sildenafil (REVATIO) 20 MG tablet Take 1 tablet (20 mg total) by mouth 3 (three) times daily as needed. 90 tablet 3  . warfarin (COUMADIN) 5 MG tablet Take as directed by the coumadin clinic. 30 tablet 2   No current facility-administered medications on file prior to visit.     Past Medical History:  Diagnosis Date  . Colonic polyp 2011 last colo   colo q 68yr - [place]   . Coronary artery disease 02/2009   severe left main and three-vessel, CABG  . DEGENERATIVE JOINT DISEASE   . Diverticulosis of colon   . DIVERTICULOSIS, COLON   . HYPERLIPIDEMIA-MIXED   . HYPERTENSION, BENIGN   . LBP (low  back pain)    s/p decompression 8/14; ESI - [place]; RFA x 3 early 2015  . Pacemaker    a. 05/2017: St. Jude (serial number [id number]) pacemaker  . Symptomatic bradycardia    a. s/p St. Jude (serial number [id number]) pacemaker 06/16/17  . Type II or unspecified type diabetes mellitus without mention of complication, not stated as uncontrolled   . VITAMIN D DEFICIENCY     Past Surgical History:  Procedure Laterality Date  . CARPAL TUNNEL RELEASE Right   . CATARACT EXTRACTION, BILATERAL     R 07/07/13, L 07/21/13  . CORONARY ARTERY BYPASS GRAFT  03/17/09   x 4  . INGUINAL  HERNIA REPAIR  1980  . KNEE ARTHROSCOPY  2007   right  . LUMBAR EPIDURAL INJECTION  2013   Has as needed.  . LUMBAR LAMINECTOMY/DECOMPRESSION MICRODISCECTOMY N/A 06/19/2013   Procedure: LUMBAR LAMINECTOMY/DECOMPRESSION MICRODISCECTOMY;  Surgeon: Emilee HeroMark Leonard Dumonski, MD;  [facility];  Service: Orthopedics;  Laterality: N/A;  Lumbar 4-5 decompression  . PACEMAKER IMPLANT N/A 06/16/2017   Procedure: Pacemaker Implant;  Surgeon: Marinus Mawaylor, Gregg W, MD;  [facility];  Service: Cardiovascular;  Laterality: N/A;  . TRIGGER FINGER RELEASE Right     Social History   Socioeconomic History  . Marital status: Married    Spouse name: Not on file  . Number of children: Not on file  . Years of education: Not on file  . Highest education level: Not on file  Occupational History  . Not on file  Social Needs  . Financial resource strain: Not on file  . Food insecurity:    Worry: Not on file    Inability: Not on file  . Transportation needs:    Medical: Not on file    Non-medical: Not on file  Tobacco Use  . Smoking status: Never Smoker  . Smokeless tobacco: Never Used  . Tobacco comment: Married, 3 grown children. Retired 04/2002 from [employer] work  Substance and Sexual Activity  . Alcohol use: Yes    Comment: RARE  . Drug use: No  . Sexual activity: Not on file  Lifestyle  . Physical activity:    Days per week: Not on file    Minutes per session: Not on file  . Stress: Not on file  Relationships  . Social connections:    Talks on phone: Not on file    Gets together: Not on file    Attends religious service: Not on file    Active member of club or organization: Not on file    Attends meetings of clubs or organizations: Not on file    Relationship status: Not on file  Other Topics Concern  . Not on file  Social History Narrative  . Not on file    Family History  Problem Relation Age of Onset  . Heart failure Mother        died age 82  .  Heart failure Father        died age 82  . Diabetes Other   . Heart disease Neg Hx     Review of Systems  Constitutional: Negative for chills and fever.  Respiratory: Negative for cough, shortness of breath and wheezing.   Cardiovascular: Negative for chest pain and palpitations.  Neurological: Negative for dizziness, light-headedness and headaches.       Objective:   Vitals:   09/30/18 0748  BP: 122/72  Pulse: 81  Resp: 16  Temp: 98.3  F (36.8 C)  SpO2: 98%   BP Readings from Last 3 Encounters:  09/30/18 122/72  08/26/18 132/84  07/05/18 111/61   Wt Readings from Last 3 Encounters:  09/30/18 186 lb (84.4 kg)  08/26/18 183 lb 8 oz (83.2 kg)  06/05/18 176 lb 12.8 oz (80.2 kg)   Body mass index is 28.28 kg/m.   Physical Exam    Constitutional: Appears well-developed and well-nourished. No distress.  HENT:  Head: Normocephalic and atraumatic.  Neck: Neck supple. No tracheal deviation present. No thyromegaly present.  No cervical lymphadenopathy Cardiovascular: Normal rate, regular rhythm and normal heart sounds.   2/6 systolic murmur heard. No carotid bruit .  Trace left lower extremity edema Pulmonary/Chest: Effort normal and breath sounds normal. No respiratory distress. No has no wheezes. No rales.  Skin: Skin is warm and dry. Not diaphoretic.  Psychiatric: Normal mood and affect. Behavior is normal.      Assessment & Plan:    See Problem List for Assessment and Plan of chronic medical problems.

## 2018-09-29 NOTE — Patient Instructions (Addendum)
  Tests ordered today. Your results will be released to MyChart (or called to you) after review, usually within 72hours after test completion. If any changes need to be made, you will be notified at that same time.  Medications reviewed and updated.  Changes include :   none      Please followup in 6 months   

## 2018-09-30 ENCOUNTER — Other Ambulatory Visit: Payer: Medicare Other

## 2018-09-30 ENCOUNTER — Other Ambulatory Visit (INDEPENDENT_AMBULATORY_CARE_PROVIDER_SITE_OTHER): Payer: Medicare Other

## 2018-09-30 ENCOUNTER — Ambulatory Visit (INDEPENDENT_AMBULATORY_CARE_PROVIDER_SITE_OTHER): Payer: Medicare Other | Admitting: Internal Medicine

## 2018-09-30 ENCOUNTER — Encounter: Payer: Self-pay | Admitting: Internal Medicine

## 2018-09-30 ENCOUNTER — Ambulatory Visit (INDEPENDENT_AMBULATORY_CARE_PROVIDER_SITE_OTHER): Payer: Medicare Other

## 2018-09-30 VITALS — BP 122/72 | HR 81 | Temp 98.3°F | Resp 16 | Ht 68.0 in | Wt 186.0 lb

## 2018-09-30 DIAGNOSIS — I10 Essential (primary) hypertension: Secondary | ICD-10-CM

## 2018-09-30 DIAGNOSIS — M1990 Unspecified osteoarthritis, unspecified site: Secondary | ICD-10-CM

## 2018-09-30 DIAGNOSIS — I82402 Acute embolism and thrombosis of unspecified deep veins of left lower extremity: Secondary | ICD-10-CM

## 2018-09-30 DIAGNOSIS — I82409 Acute embolism and thrombosis of unspecified deep veins of unspecified lower extremity: Secondary | ICD-10-CM

## 2018-09-30 DIAGNOSIS — Z5181 Encounter for therapeutic drug level monitoring: Secondary | ICD-10-CM | POA: Diagnosis not present

## 2018-09-30 DIAGNOSIS — E1149 Type 2 diabetes mellitus with other diabetic neurological complication: Secondary | ICD-10-CM

## 2018-09-30 DIAGNOSIS — I25708 Atherosclerosis of coronary artery bypass graft(s), unspecified, with other forms of angina pectoris: Secondary | ICD-10-CM

## 2018-09-30 DIAGNOSIS — E782 Mixed hyperlipidemia: Secondary | ICD-10-CM

## 2018-09-30 LAB — CBC WITH DIFFERENTIAL/PLATELET
Basophils Absolute: 0 10*3/uL (ref 0.0–0.1)
Basophils Relative: 0.7 % (ref 0.0–3.0)
EOS ABS: 0.4 10*3/uL (ref 0.0–0.7)
Eosinophils Relative: 5.8 % — ABNORMAL HIGH (ref 0.0–5.0)
HEMATOCRIT: 38.1 % — AB (ref 39.0–52.0)
Hemoglobin: 12.7 g/dL — ABNORMAL LOW (ref 13.0–17.0)
Lymphocytes Relative: 26.6 % (ref 12.0–46.0)
Lymphs Abs: 1.7 10*3/uL (ref 0.7–4.0)
MCHC: 33.2 g/dL (ref 30.0–36.0)
MCV: 99.4 fl (ref 78.0–100.0)
Monocytes Absolute: 0.4 10*3/uL (ref 0.1–1.0)
Monocytes Relative: 6.5 % (ref 3.0–12.0)
Neutro Abs: 3.8 10*3/uL (ref 1.4–7.7)
Neutrophils Relative %: 60.4 % (ref 43.0–77.0)
Platelets: 200 10*3/uL (ref 150.0–400.0)
RBC: 3.83 Mil/uL — ABNORMAL LOW (ref 4.22–5.81)
RDW: 14.6 % (ref 11.5–15.5)
WBC: 6.3 10*3/uL (ref 4.0–10.5)

## 2018-09-30 LAB — COMPREHENSIVE METABOLIC PANEL
ALT: 14 U/L (ref 0–53)
AST: 17 U/L (ref 0–37)
Albumin: 4.3 g/dL (ref 3.5–5.2)
Alkaline Phosphatase: 49 U/L (ref 39–117)
BUN: 35 mg/dL — ABNORMAL HIGH (ref 6–23)
CO2: 25 mEq/L (ref 19–32)
Calcium: 9.3 mg/dL (ref 8.4–10.5)
Chloride: 108 mEq/L (ref 96–112)
Creatinine, Ser: 1.27 mg/dL (ref 0.40–1.50)
GFR: 57.66 mL/min — ABNORMAL LOW (ref >60.00)
Glucose, Bld: 194 mg/dL — ABNORMAL HIGH (ref 70–99)
Potassium: 4.2 mEq/L (ref 3.5–5.1)
Sodium: 141 mEq/L (ref 135–145)
Total Bilirubin: 0.4 mg/dL (ref 0.2–1.2)
Total Protein: 7 g/dL (ref 6.0–8.3)

## 2018-09-30 LAB — LIPID PANEL
CHOL/HDL RATIO: 3
Cholesterol: 144 mg/dL (ref 0–200)
HDL: 55.5 mg/dL (ref >39.00)
LDL Cholesterol: 66 mg/dL (ref 0–99)
NonHDL: 88.88
Triglycerides: 115 mg/dL (ref 0.0–149.0)
VLDL: 23 mg/dL (ref 0.0–40.0)

## 2018-09-30 LAB — HEMOGLOBIN A1C: Hgb A1c MFr Bld: 6.4 % (ref 4.6–6.5)

## 2018-09-30 LAB — MICROALBUMIN / CREATININE URINE RATIO
Creatinine,U: 142.7 mg/dL
Microalb Creat Ratio: 0.8 mg/g (ref 0.0–30.0)
Microalb, Ur: 1.2 mg/dL (ref 0.0–1.9)

## 2018-09-30 LAB — POCT INR: INR: 3.1 — AB (ref 2.0–3.0)

## 2018-09-30 MED ORDER — WARFARIN SODIUM 5 MG PO TABS: ORAL_TABLET | ORAL | 2 refills | Status: DC

## 2018-09-30 MED ORDER — CELECOXIB 200 MG PO CAPS: 200.0000 mg | ORAL_CAPSULE | Freq: Two times a day (BID) | ORAL | 1 refills | Status: DC

## 2018-09-30 NOTE — Assessment & Plan Note (Signed)
BP well controlled Current regimen effective and well tolerated Continue current medications at current doses cmp  

## 2018-09-30 NOTE — Assessment & Plan Note (Signed)
No chest pain, palps, Sob Following with cardiology Cmp, cbc Continue current medications

## 2018-09-30 NOTE — Patient Instructions (Signed)
Please have a serving of greens and continue previous dosage of  2.5 mg daily except 5 mg on Mondays, Wednesdays and Fridays. Recheck in 4 weeks.

## 2018-09-30 NOTE — Assessment & Plan Note (Signed)
He is currently taking meloxicam daily and wants to switch back to celebrex, which was more effective He is on warfarin and takes a baby aspirin and understands the risks of being on an NSAID as well-he denies any issues with bleeding or abnormal bruising Will prescribe Celebrex-cardiology is monitoring his warfarin and aware that he is on an NSAID and baby aspirin

## 2018-09-30 NOTE — Assessment & Plan Note (Signed)
Check lipid panel  Continue daily statin Regular exercise and healthy diet encouraged  

## 2018-10-02 ENCOUNTER — Encounter: Payer: Self-pay | Admitting: Internal Medicine

## 2018-10-23 ENCOUNTER — Other Ambulatory Visit: Payer: Self-pay | Admitting: Internal Medicine

## 2018-10-28 ENCOUNTER — Ambulatory Visit (INDEPENDENT_AMBULATORY_CARE_PROVIDER_SITE_OTHER): Payer: Medicare Other

## 2018-10-28 DIAGNOSIS — Z5181 Encounter for therapeutic drug level monitoring: Secondary | ICD-10-CM

## 2018-10-28 DIAGNOSIS — I82409 Acute embolism and thrombosis of unspecified deep veins of unspecified lower extremity: Secondary | ICD-10-CM | POA: Diagnosis not present

## 2018-10-28 LAB — POCT INR: INR: 2.1 (ref 2.0–3.0)

## 2018-10-28 NOTE — Patient Instructions (Signed)
Please continue dosage of  2.5 mg daily except 5 mg on Mondays, Wednesdays and Fridays. Recheck in 5 weeks.

## 2018-11-08 LAB — CUP PACEART REMOTE DEVICE CHECK
Battery Remaining Longevity: 130 mo
Battery Voltage: 3.01 V
Brady Statistic AP VP Percent: 14 %
Brady Statistic AP VS Percent: 1 %
Brady Statistic AS VP Percent: 85 %
Brady Statistic AS VS Percent: 1 %
Brady Statistic RA Percent Paced: 14 %
Brady Statistic RV Percent Paced: 99 %
Implantable Lead Implant Date: 20180825
Implantable Lead Implant Date: 20180825
Implantable Lead Location: 753859
Lead Channel Impedance Value: 490 Ohm
Lead Channel Impedance Value: 690 Ohm
Lead Channel Pacing Threshold Amplitude: 0.5 V
Lead Channel Pacing Threshold Amplitude: 0.5 V
Lead Channel Pacing Threshold Pulse Width: 0.5 ms
Lead Channel Pacing Threshold Pulse Width: 0.5 ms
Lead Channel Sensing Intrinsic Amplitude: 3.6 mV
Lead Channel Sensing Intrinsic Amplitude: 6.4 mV
Lead Channel Setting Pacing Amplitude: 0.75 V
Lead Channel Setting Pacing Amplitude: 2 V
Lead Channel Setting Pacing Pulse Width: 0.5 ms
Lead Channel Setting Sensing Sensitivity: 4 mV
MDC IDC LEAD LOCATION: 753860
MDC IDC MSMT BATTERY REMAINING PERCENTAGE: 95.5 %
MDC IDC PG IMPLANT DT: 20180825
MDC IDC PG SERIAL: 8939923
MDC IDC SESS DTM: 20191126010030

## 2018-12-04 ENCOUNTER — Ambulatory Visit (INDEPENDENT_AMBULATORY_CARE_PROVIDER_SITE_OTHER): Payer: Medicare Other

## 2018-12-04 DIAGNOSIS — I82409 Acute embolism and thrombosis of unspecified deep veins of unspecified lower extremity: Secondary | ICD-10-CM | POA: Diagnosis not present

## 2018-12-04 DIAGNOSIS — Z5181 Encounter for therapeutic drug level monitoring: Secondary | ICD-10-CM

## 2018-12-04 LAB — POCT INR: INR: 4.1 — AB (ref 2.0–3.0)

## 2018-12-04 NOTE — Patient Instructions (Signed)
Please have some greens today, SKIP COUMADIN TONIGHT, then continue dosage of  2.5 mg daily except 5 mg on Mondays, Wednesdays and Fridays. Recheck in 4 weeks.

## 2018-12-17 ENCOUNTER — Ambulatory Visit (INDEPENDENT_AMBULATORY_CARE_PROVIDER_SITE_OTHER): Payer: Medicare Other | Admitting: *Deleted

## 2018-12-17 DIAGNOSIS — I442 Atrioventricular block, complete: Secondary | ICD-10-CM

## 2018-12-17 DIAGNOSIS — R001 Bradycardia, unspecified: Secondary | ICD-10-CM

## 2018-12-18 ENCOUNTER — Telehealth: Payer: Self-pay

## 2018-12-18 LAB — CUP PACEART REMOTE DEVICE CHECK
Battery Remaining Longevity: 129 mo
Battery Remaining Percentage: 95.5 %
Battery Voltage: 3.01 V
Brady Statistic AP VP Percent: 15 %
Brady Statistic AP VS Percent: 1 %
Brady Statistic AS VP Percent: 85 %
Brady Statistic AS VS Percent: 1 %
Brady Statistic RA Percent Paced: 15 %
Brady Statistic RV Percent Paced: 99 %
Date Time Interrogation Session: 20200226155925
Implantable Lead Implant Date: 20180825
Implantable Lead Implant Date: 20180825
Implantable Lead Location: 753859
Implantable Lead Location: 753860
Implantable Pulse Generator Implant Date: 20180825
Lead Channel Impedance Value: 460 Ohm
Lead Channel Pacing Threshold Amplitude: 0.5 V
Lead Channel Pacing Threshold Pulse Width: 0.5 ms
Lead Channel Pacing Threshold Pulse Width: 0.5 ms
Lead Channel Sensing Intrinsic Amplitude: 4 mV
Lead Channel Sensing Intrinsic Amplitude: 6.4 mV
Lead Channel Setting Pacing Amplitude: 2 V
Lead Channel Setting Pacing Pulse Width: 0.5 ms
Lead Channel Setting Sensing Sensitivity: 4 mV
MDC IDC MSMT LEADCHNL RA PACING THRESHOLD AMPLITUDE: 0.5 V
MDC IDC MSMT LEADCHNL RV IMPEDANCE VALUE: 680 Ohm
MDC IDC SET LEADCHNL RV PACING AMPLITUDE: 0.75 V
Pulse Gen Model: 2272
Pulse Gen Serial Number: 8939923

## 2018-12-18 NOTE — Telephone Encounter (Signed)
Spoke with patient to remind of missed remote transmission 

## 2018-12-24 NOTE — Progress Notes (Signed)
Remote pacemaker transmission.   

## 2019-01-01 ENCOUNTER — Ambulatory Visit (INDEPENDENT_AMBULATORY_CARE_PROVIDER_SITE_OTHER): Payer: Medicare Other

## 2019-01-01 ENCOUNTER — Other Ambulatory Visit: Payer: Self-pay

## 2019-01-01 DIAGNOSIS — Z5181 Encounter for therapeutic drug level monitoring: Secondary | ICD-10-CM | POA: Diagnosis not present

## 2019-01-01 DIAGNOSIS — I82409 Acute embolism and thrombosis of unspecified deep veins of unspecified lower extremity: Secondary | ICD-10-CM

## 2019-01-01 LAB — POCT INR: INR: 1.9 — AB (ref 2.0–3.0)

## 2019-01-01 NOTE — Patient Instructions (Signed)
Since you are increasing your greens intake, please take 1 tablet tonight, then START NEW DOSAGE of  5 mg daily except 2.5 mg on Mondays, Wednesdays and Fridays. Recheck in 3 weeks.

## 2019-01-21 ENCOUNTER — Other Ambulatory Visit: Payer: Self-pay

## 2019-01-22 ENCOUNTER — Ambulatory Visit (INDEPENDENT_AMBULATORY_CARE_PROVIDER_SITE_OTHER): Payer: Medicare Other

## 2019-01-22 DIAGNOSIS — Z5181 Encounter for therapeutic drug level monitoring: Secondary | ICD-10-CM | POA: Diagnosis not present

## 2019-01-22 DIAGNOSIS — I82409 Acute embolism and thrombosis of unspecified deep veins of unspecified lower extremity: Secondary | ICD-10-CM

## 2019-01-22 LAB — POCT INR: INR: 3.4 — AB (ref 2.0–3.0)

## 2019-01-22 MED ORDER — WARFARIN SODIUM 5 MG PO TABS: ORAL_TABLET | ORAL | 1 refills | Status: DC

## 2019-01-22 NOTE — Patient Instructions (Signed)
Since you are unable to increase your greens intake, please skip coumadin tonight, then resume previous dosage of 2.5 mg daily except 5 mg on MONDAYS, WEDNESDAYS & FRIDAYS. Recheck in 5 weeks.

## 2019-02-22 ENCOUNTER — Other Ambulatory Visit: Payer: Self-pay | Admitting: Internal Medicine

## 2019-02-24 ENCOUNTER — Telehealth: Payer: Self-pay

## 2019-02-24 NOTE — Telephone Encounter (Signed)

## 2019-02-26 ENCOUNTER — Ambulatory Visit (INDEPENDENT_AMBULATORY_CARE_PROVIDER_SITE_OTHER): Payer: Medicare Other

## 2019-02-26 ENCOUNTER — Other Ambulatory Visit: Payer: Self-pay

## 2019-02-26 DIAGNOSIS — Z5181 Encounter for therapeutic drug level monitoring: Secondary | ICD-10-CM | POA: Diagnosis not present

## 2019-02-26 DIAGNOSIS — I82409 Acute embolism and thrombosis of unspecified deep veins of unspecified lower extremity: Secondary | ICD-10-CM | POA: Diagnosis not present

## 2019-02-26 LAB — POCT INR: INR: 1.9 — AB (ref 2.0–3.0)

## 2019-02-26 NOTE — Progress Notes (Signed)
Pt asks that I email instructions to [email or web address]

## 2019-02-26 NOTE — Patient Instructions (Signed)
Please take 1 whole tablet tomorrow, then resume previous dosage of 2.5 mg daily except 5 mg on MONDAYS, WEDNESDAYS & FRIDAYS. Recheck in 5 weeks.

## 2019-03-11 ENCOUNTER — Other Ambulatory Visit: Payer: Self-pay | Admitting: Internal Medicine

## 2019-03-19 ENCOUNTER — Ambulatory Visit (INDEPENDENT_AMBULATORY_CARE_PROVIDER_SITE_OTHER): Payer: Medicare Other | Admitting: *Deleted

## 2019-03-19 DIAGNOSIS — I442 Atrioventricular block, complete: Secondary | ICD-10-CM | POA: Diagnosis not present

## 2019-03-20 ENCOUNTER — Telehealth: Payer: Self-pay

## 2019-03-20 NOTE — Telephone Encounter (Signed)
Left message for patient to remind of missed remote transmission.  

## 2019-03-21 LAB — CUP PACEART REMOTE DEVICE CHECK
Date Time Interrogation Session: 20200529200312
Implantable Lead Implant Date: 20180825
Implantable Lead Implant Date: 20180825
Implantable Lead Location: 753859
Implantable Lead Location: 753860
Implantable Pulse Generator Implant Date: 20180825
Pulse Gen Model: 2272
Pulse Gen Serial Number: 8939923

## 2019-03-27 ENCOUNTER — Other Ambulatory Visit: Payer: Self-pay | Admitting: Internal Medicine

## 2019-03-28 ENCOUNTER — Encounter: Payer: Self-pay | Admitting: Cardiology

## 2019-03-28 NOTE — Progress Notes (Signed)
Remote pacemaker transmission.   

## 2019-03-31 ENCOUNTER — Telehealth: Payer: Self-pay

## 2019-03-31 NOTE — Telephone Encounter (Signed)

## 2019-03-31 NOTE — Telephone Encounter (Signed)
Attempted to contact pt to prescreen for COVID19 prior to INR check on Wed 6/10. Left detailed message on pt's vm that I am seeing pts back in the office and due to COVID precautions, need to move his appt to a later time.  Asked him to call back to reschedule.

## 2019-03-31 NOTE — Progress Notes (Signed)
Subjective:    Patient ID: Sherryl BartersCraig Jeter, male    DOB: 03/09/1934, 85 y.o.   MRN: [id number]  HPI The patient is here for follow up.  He is very active with yard work, splitting wood.     Diabetes: He is taking his medication daily as prescribed. He is compliant with a diabetic diet.  He checks his feet daily and denies foot lesions. He is up-to-date with an ophthalmology examination.   Hypertension: He is taking his medication daily. He is compliant with a low sodium diet.  He denies chest pain, palpitations, edema, shortness of breath and regular headaches.  He does not monitor his blood pressure at home.    Hyperlipidemia: He is taking his medication daily. He is compliant with a low fat/cholesterol diet. He denies myalgias.   Osteoarthritis: He takes Celebrex twice daily.  His arthritis pain is controlled.     Medications and allergies reviewed with patient and updated if appropriate.  Patient Active Problem List   Diagnosis Date Noted  . DVT (deep venous thrombosis) (HCC) 08/28/2017  . Complete heart block (HCC) 06/15/2017  . Chronic fatigue 01/20/2017  . Wears hearing aid 09/25/2016  . Degeneration of lumbar or lumbosacral intervertebral disc 03/23/2015  . Orthostatic hypotension 03/27/2013  . Generalized muscle ache 05/28/2012  . Vitamin D deficiency 11/03/2010  . DIVERTICULOSIS, COLON 11/03/2010  . Osteoarthritis (arthritis due to wear and tear of joints) 11/03/2010  . Hyperlipidemia 12/01/2009  . HYPERTENSION, BENIGN 12/01/2009  . CAD, ARTERY BYPASS GRAFT 12/01/2009  . Type 2 diabetes mellitus with neurological manifestations, controlled (HCC)     Current Outpatient Medications on File Prior to Visit  Medication Sig Dispense Refill  . aspirin EC 81 MG tablet Take 81 mg by mouth daily.    Marland Kitchen. atorvastatin (LIPITOR) 20 MG tablet Take 1 tablet daily at 6 P.M. Needs office visit for more refills. 90 tablet 0  . carvedilol (COREG) 6.25 MG tablet TAKE 1 TABLET TWICE A  DAY WITH MEALS 180 tablet 4  . celecoxib (CELEBREX) 200 MG capsule Take 1 capsule (200 mg total) by mouth 2 (two) times daily. 180 capsule 1  . cholecalciferol (VITAMIN D) 1000 UNITS tablet Take 1,000 Units by mouth daily.    Marland Kitchen. HYDROcodone-acetaminophen (NORCO/VICODIN) 5-325 MG per tablet Take 1 tablet by mouth every 6 (six) hours as needed for moderate pain.     . metFORMIN (GLUCOPHAGE-XR) 500 MG 24 hr tablet TAKE 1 TABLET TWICE A DAY 180 tablet 1  . Multiple Vitamin (MULTIVITAMIN WITH MINERALS) TABS tablet Take 1 tablet by mouth daily.    . niacin (NIASPAN) 1000 MG CR tablet TAKE 1 TABLET AT BEDTIME 90 tablet 3  . sildenafil (REVATIO) 20 MG tablet Take 1 tablet (20 mg total) by mouth 3 (three) times daily as needed. 90 tablet 3  . warfarin (COUMADIN) 5 MG tablet Take as directed by the coumadin clinic. 45 tablet 1   No current facility-administered medications on file prior to visit.     Past Medical History:  Diagnosis Date  . Colonic polyp 2011 last colo   colo q 5062yr - [facility]   . Coronary artery disease 02/2009   severe left main and three-vessel, CABG  . DEGENERATIVE JOINT DISEASE   . Diverticulosis of colon   . DIVERTICULOSIS, COLON   . HYPERLIPIDEMIA-MIXED   . HYPERTENSION, BENIGN   . LBP (low back pain)    s/p decompression 8/14; ESI - [place]; RFA x 3 early 2015  .  Pacemaker    a. 05/2017: St. Jude (serial number [id number]) pacemaker  . Symptomatic bradycardia    a. s/p St. Jude (serial number [id number]) pacemaker 06/16/17  . Type II or unspecified type diabetes mellitus without mention of complication, not stated as uncontrolled   . VITAMIN D DEFICIENCY     Past Surgical History:  Procedure Laterality Date  . CARPAL TUNNEL RELEASE Right   . CATARACT EXTRACTION, BILATERAL     R 07/07/13, L 07/21/13  . CORONARY ARTERY BYPASS GRAFT  03/17/09   x 4  . INGUINAL HERNIA REPAIR  1980  . KNEE ARTHROSCOPY  2007   right  . LUMBAR EPIDURAL INJECTION  2013   Has as needed.  . LUMBAR  LAMINECTOMY/DECOMPRESSION MICRODISCECTOMY N/A 06/19/2013   Procedure: LUMBAR LAMINECTOMY/DECOMPRESSION MICRODISCECTOMY;  Surgeon: Sinclair Ship, MD;  Location: [facility];  Service: Orthopedics;  Laterality: N/A;  Lumbar 4-5 decompression  . PACEMAKER IMPLANT N/A 06/16/2017   Procedure: Pacemaker Implant;  Surgeon: Evans Lance, MD;  Location: [place] CV LAB;  Service: Cardiovascular;  Laterality: N/A;  . TRIGGER FINGER RELEASE Right     Social History   Socioeconomic History  . Marital status: Married    Spouse name: Not on file  . Number of children: Not on file  . Years of education: Not on file  . Highest education level: Not on file  Occupational History  . Not on file  Social Needs  . Financial resource strain: Not on file  . Food insecurity:    Worry: Not on file    Inability: Not on file  . Transportation needs:    Medical: Not on file    Non-medical: Not on file  Tobacco Use  . Smoking status: Never Smoker  . Smokeless tobacco: Never Used  . Tobacco comment: Married, 3 grown children. Retired 04/2002 from [employer] work  Substance and Sexual Activity  . Alcohol use: Yes    Comment: RARE  . Drug use: No  . Sexual activity: Not on file  Lifestyle  . Physical activity:    Days per week: Not on file    Minutes per session: Not on file  . Stress: Not on file  Relationships  . Social connections:    Talks on phone: Not on file    Gets together: Not on file    Attends religious service: Not on file    Active member of club or organization: Not on file    Attends meetings of clubs or organizations: Not on file    Relationship status: Not on file  Other Topics Concern  . Not on file  Social History Narrative  . Not on file    Family History  Problem Relation Age of Onset  . Heart failure Mother        died age 28  . Heart failure Father        died age 72  . Diabetes Other   . Heart disease Neg Hx     Review of Systems   Constitutional: Negative for chills and fever.  Respiratory: Negative for cough, shortness of breath and wheezing.   Cardiovascular: Positive for leg swelling (chronic, no change). Negative for chest pain and palpitations.  Neurological: Negative for light-headedness and headaches.  Hematological: Bruises/bleeds easily.       Objective:   Vitals:   04/01/19 0745  BP: 102/68  Pulse: 71  Resp: 16  Temp: 98.3 F (36.8 C)  SpO2: 97%  BP Readings from Last 3 Encounters:  04/01/19 102/68  09/30/18 122/72  08/26/18 132/84   Wt Readings from Last 3 Encounters:  04/01/19 182 lb (82.6 kg)  09/30/18 186 lb (84.4 kg)  08/26/18 183 lb 8 oz (83.2 kg)   Body mass index is 27.67 kg/m.   Physical Exam    Constitutional: Appears well-developed and well-nourished. No distress.  HENT:  Head: Normocephalic and atraumatic.  Neck: Neck supple. No tracheal deviation present. No thyromegaly present.  No cervical lymphadenopathy Cardiovascular: Normal rate, regular rhythm and normal heart sounds.   No murmur heard. No carotid bruit .  Trace b/l LE edema Pulmonary/Chest: Effort normal and breath sounds normal. No respiratory distress. No has no wheezes. No rales.  Skin: Skin is warm and dry. Not diaphoretic.  Psychiatric: Normal mood and affect. Behavior is normal.      Assessment & Plan:    See Problem List for Assessment and Plan of chronic medical problems.

## 2019-04-01 ENCOUNTER — Other Ambulatory Visit (INDEPENDENT_AMBULATORY_CARE_PROVIDER_SITE_OTHER): Payer: Medicare Other

## 2019-04-01 ENCOUNTER — Other Ambulatory Visit: Payer: Self-pay

## 2019-04-01 ENCOUNTER — Encounter: Payer: Self-pay | Admitting: Internal Medicine

## 2019-04-01 ENCOUNTER — Ambulatory Visit (INDEPENDENT_AMBULATORY_CARE_PROVIDER_SITE_OTHER): Payer: Medicare Other | Admitting: Internal Medicine

## 2019-04-01 VITALS — BP 102/68 | HR 71 | Temp 98.3°F | Resp 16 | Ht 68.0 in | Wt 182.0 lb

## 2019-04-01 DIAGNOSIS — E1149 Type 2 diabetes mellitus with other diabetic neurological complication: Secondary | ICD-10-CM

## 2019-04-01 DIAGNOSIS — M1990 Unspecified osteoarthritis, unspecified site: Secondary | ICD-10-CM | POA: Diagnosis not present

## 2019-04-01 DIAGNOSIS — E782 Mixed hyperlipidemia: Secondary | ICD-10-CM | POA: Diagnosis not present

## 2019-04-01 DIAGNOSIS — I10 Essential (primary) hypertension: Secondary | ICD-10-CM | POA: Diagnosis not present

## 2019-04-01 LAB — LIPID PANEL
Cholesterol: 131 mg/dL (ref 0–200)
HDL: 38.9 mg/dL — ABNORMAL LOW (ref >39.00)
LDL Cholesterol: 66 mg/dL (ref 0–99)
NonHDL: 92.22
Total CHOL/HDL Ratio: 3
Triglycerides: 130 mg/dL (ref 0.0–149.0)
VLDL: 26 mg/dL (ref 0.0–40.0)

## 2019-04-01 LAB — CBC WITH DIFFERENTIAL/PLATELET
Basophils Absolute: 0.1 10*3/uL (ref 0.0–0.1)
Basophils Relative: 0.9 % (ref 0.0–3.0)
Eosinophils Absolute: 0.5 10*3/uL (ref 0.0–0.7)
Eosinophils Relative: 7.2 % — ABNORMAL HIGH (ref 0.0–5.0)
HCT: 38.4 % — ABNORMAL LOW (ref 39.0–52.0)
Hemoglobin: 12.8 g/dL — ABNORMAL LOW (ref 13.0–17.0)
Lymphocytes Relative: 22.1 % (ref 12.0–46.0)
Lymphs Abs: 1.4 10*3/uL (ref 0.7–4.0)
MCHC: 33.2 g/dL (ref 30.0–36.0)
MCV: 101 fl — ABNORMAL HIGH (ref 78.0–100.0)
Monocytes Absolute: 0.5 10*3/uL (ref 0.1–1.0)
Monocytes Relative: 8.1 % (ref 3.0–12.0)
Neutro Abs: 3.9 10*3/uL (ref 1.4–7.7)
Neutrophils Relative %: 61.7 % (ref 43.0–77.0)
Platelets: 207 10*3/uL (ref 150.0–400.0)
RBC: 3.81 Mil/uL — ABNORMAL LOW (ref 4.22–5.81)
RDW: 14.2 % (ref 11.5–15.5)
WBC: 6.3 10*3/uL (ref 4.0–10.5)

## 2019-04-01 LAB — COMPREHENSIVE METABOLIC PANEL
ALT: 14 U/L (ref 0–53)
AST: 26 U/L (ref 0–37)
Albumin: 4.1 g/dL (ref 3.5–5.2)
Alkaline Phosphatase: 47 U/L (ref 39–117)
BUN: 31 mg/dL — ABNORMAL HIGH (ref 6–23)
CO2: 24 mEq/L (ref 19–32)
Calcium: 9.2 mg/dL (ref 8.4–10.5)
Chloride: 107 mEq/L (ref 96–112)
Creatinine, Ser: 1.38 mg/dL (ref 0.40–1.50)
GFR: 49.23 mL/min — ABNORMAL LOW (ref >60.00)
Glucose, Bld: 179 mg/dL — ABNORMAL HIGH (ref 70–99)
Potassium: 4.4 mEq/L (ref 3.5–5.1)
Sodium: 141 mEq/L (ref 135–145)
Total Bilirubin: 0.5 mg/dL (ref 0.2–1.2)
Total Protein: 6.7 g/dL (ref 6.0–8.3)

## 2019-04-01 LAB — HEMOGLOBIN A1C: Hgb A1c MFr Bld: 6.8 % — ABNORMAL HIGH (ref 4.6–6.5)

## 2019-04-01 MED ORDER — CELECOXIB 200 MG PO CAPS: 200.0000 mg | ORAL_CAPSULE | Freq: Two times a day (BID) | ORAL | 1 refills | Status: DC

## 2019-04-01 NOTE — Assessment & Plan Note (Signed)
Check lipid panel, cmp Continue daily statin Regular exercise and healthy diet encouraged  

## 2019-04-01 NOTE — Assessment & Plan Note (Signed)
Lab Results  Component Value Date   HGBA1C 6.4 09/30/2018    Check a1c Low sugar / carb diet Stressed regular exercise

## 2019-04-01 NOTE — Assessment & Plan Note (Signed)
BP well controlled Current regimen effective and well tolerated Continue current medications at current doses cmp  

## 2019-04-01 NOTE — Patient Instructions (Addendum)

## 2019-04-01 NOTE — Assessment & Plan Note (Addendum)
Taking celebrex twice daily Cardiology aware and monitors INR Check cbc Arthritis pain controlled continue

## 2019-04-02 ENCOUNTER — Ambulatory Visit (INDEPENDENT_AMBULATORY_CARE_PROVIDER_SITE_OTHER): Payer: Medicare Other

## 2019-04-02 ENCOUNTER — Encounter: Payer: Self-pay | Admitting: Internal Medicine

## 2019-04-02 DIAGNOSIS — Z5181 Encounter for therapeutic drug level monitoring: Secondary | ICD-10-CM

## 2019-04-02 DIAGNOSIS — I82409 Acute embolism and thrombosis of unspecified deep veins of unspecified lower extremity: Secondary | ICD-10-CM

## 2019-04-02 LAB — POCT INR: INR: 4.8 — AB (ref 2.0–3.0)

## 2019-04-02 NOTE — Patient Instructions (Signed)
Please skip coumadin tonight and tomorrow, then resume previous dosage of 2.5 mg daily except 5 mg on MONDAYS, [place]. Please be consistent w/ your green leafy vegetables.  Recheck in 4 weeks.

## 2019-04-28 ENCOUNTER — Telehealth: Payer: Self-pay

## 2019-04-28 NOTE — Telephone Encounter (Signed)

## 2019-04-30 ENCOUNTER — Other Ambulatory Visit: Payer: Self-pay

## 2019-04-30 ENCOUNTER — Ambulatory Visit (INDEPENDENT_AMBULATORY_CARE_PROVIDER_SITE_OTHER): Payer: Medicare Other

## 2019-04-30 DIAGNOSIS — I82409 Acute embolism and thrombosis of unspecified deep veins of unspecified lower extremity: Secondary | ICD-10-CM

## 2019-04-30 DIAGNOSIS — Z5181 Encounter for therapeutic drug level monitoring: Secondary | ICD-10-CM

## 2019-04-30 LAB — POCT INR: INR: 2.9 (ref 2.0–3.0)

## 2019-04-30 NOTE — Patient Instructions (Signed)
Please continue current dosage of 2.5 mg daily except 5 mg on MONDAYS, [place]. Please be consistent w/ your green leafy vegetables.  Recheck in 5 weeks.

## 2019-05-04 ENCOUNTER — Other Ambulatory Visit: Payer: Self-pay | Admitting: Internal Medicine

## 2019-05-16 IMAGING — CR DG CHEST 1V PORT
1 series · 1 of 1 positions shown · non-contrast
Comparison: 06/15/2017.

CLINICAL DATA: Pacemaker placement.

EXAM:
PORTABLE CHEST 1 VIEW

[AP]
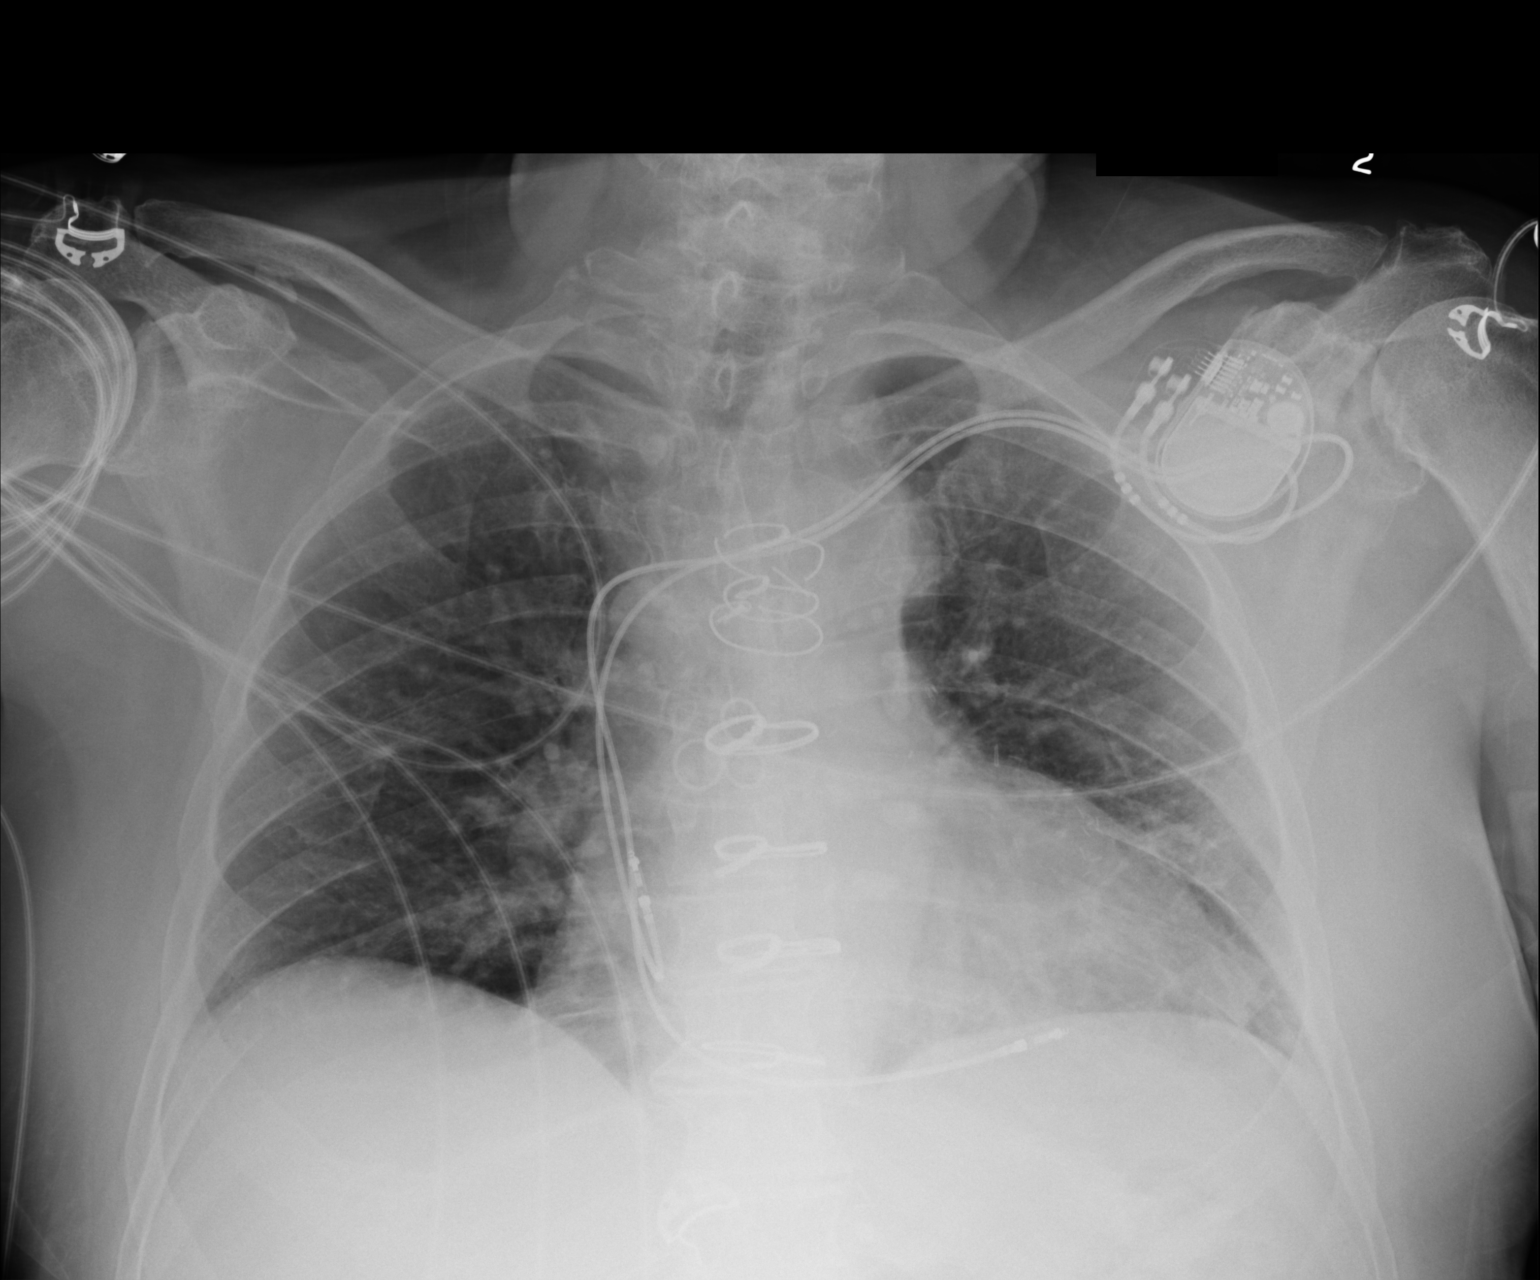

[1 of 1 positions shown; findings below may reference images not displayed]

FINDINGS: Stable cardiac silhouette. Dual lead pacer in good position based on
the single portable AP radiograph. No pneumothorax. No significant
pulmonary opacity.
IMPRESSION: Dual lead pacer in good position.  No pneumothorax.

## 2019-06-04 ENCOUNTER — Ambulatory Visit (INDEPENDENT_AMBULATORY_CARE_PROVIDER_SITE_OTHER): Payer: Medicare Other

## 2019-06-04 ENCOUNTER — Other Ambulatory Visit: Payer: Self-pay

## 2019-06-04 DIAGNOSIS — I82409 Acute embolism and thrombosis of unspecified deep veins of unspecified lower extremity: Secondary | ICD-10-CM

## 2019-06-04 DIAGNOSIS — Z5181 Encounter for therapeutic drug level monitoring: Secondary | ICD-10-CM | POA: Diagnosis not present

## 2019-06-04 LAB — POCT INR: INR: 2.6 (ref 2.0–3.0)

## 2019-06-04 NOTE — Patient Instructions (Signed)
Please continue current dosage of 2.5 mg daily except 5 mg on MONDAYS, [place]. Please be consistent w/ your green leafy vegetables.  Recheck in 6 weeks.

## 2019-06-10 ENCOUNTER — Other Ambulatory Visit: Payer: Self-pay | Admitting: Cardiovascular Disease

## 2019-06-10 MED ORDER — WARFARIN SODIUM 5 MG PO TABS: ORAL_TABLET | ORAL | 1 refills | Status: DC

## 2019-06-10 NOTE — Telephone Encounter (Signed)
°*  STAT* If patient is at the pharmacy, call can be transferred to refill team.   1. Which medications need to be refilled? (please list name of each medication and dose if known) warfarin (COUMADIN) 5 MG  2. Which pharmacy/location (including street and city if local pharmacy) is medication to be sent to? [facility] on [address]   3. Do they need a 30 day or 90 day supply? 30 day

## 2019-06-18 ENCOUNTER — Ambulatory Visit (INDEPENDENT_AMBULATORY_CARE_PROVIDER_SITE_OTHER): Payer: Medicare Other | Admitting: *Deleted

## 2019-06-18 DIAGNOSIS — I442 Atrioventricular block, complete: Secondary | ICD-10-CM

## 2019-06-18 LAB — CUP PACEART REMOTE DEVICE CHECK
Battery Remaining Longevity: 128 mo
Battery Remaining Percentage: 95.5 %
Battery Voltage: 3.01 V
Brady Statistic AP VP Percent: 17 %
Brady Statistic AP VS Percent: 1 %
Brady Statistic AS VP Percent: 83 %
Brady Statistic AS VS Percent: 1 %
Brady Statistic RA Percent Paced: 17 %
Brady Statistic RV Percent Paced: 99 %
Date Time Interrogation Session: 20200825010739
Implantable Lead Implant Date: 20180825
Implantable Lead Implant Date: 20180825
Implantable Lead Location: 753859
Implantable Lead Location: 753860
Implantable Pulse Generator Implant Date: 20180825
Lead Channel Impedance Value: 480 Ohm
Lead Channel Impedance Value: 680 Ohm
Lead Channel Pacing Threshold Amplitude: 0.5 V
Lead Channel Pacing Threshold Amplitude: 0.625 V
Lead Channel Pacing Threshold Pulse Width: 0.5 ms
Lead Channel Pacing Threshold Pulse Width: 0.5 ms
Lead Channel Sensing Intrinsic Amplitude: 4.4 mV
Lead Channel Sensing Intrinsic Amplitude: 4.5 mV
Lead Channel Setting Pacing Amplitude: 0.875
Lead Channel Setting Pacing Amplitude: 2 V
Lead Channel Setting Pacing Pulse Width: 0.5 ms
Lead Channel Setting Sensing Sensitivity: 4 mV
Pulse Gen Model: 2272
Pulse Gen Serial Number: 8939923

## 2019-06-27 ENCOUNTER — Encounter: Payer: Self-pay | Admitting: Cardiology

## 2019-06-27 NOTE — Progress Notes (Signed)
Remote pacemaker transmission.   

## 2019-07-16 ENCOUNTER — Other Ambulatory Visit: Payer: Self-pay

## 2019-07-16 ENCOUNTER — Ambulatory Visit (INDEPENDENT_AMBULATORY_CARE_PROVIDER_SITE_OTHER): Payer: Medicare Other

## 2019-07-16 DIAGNOSIS — I82409 Acute embolism and thrombosis of unspecified deep veins of unspecified lower extremity: Secondary | ICD-10-CM

## 2019-07-16 DIAGNOSIS — Z5181 Encounter for therapeutic drug level monitoring: Secondary | ICD-10-CM

## 2019-07-16 LAB — POCT INR: INR: 3.1 — AB (ref 2.0–3.0)

## 2019-07-16 NOTE — Patient Instructions (Signed)
Please have a large serving of greens today and continue current dosage of 2.5 mg daily except 5 mg on MONDAYS, Andrew [name]. Please be consistent w/ your green leafy vegetables.  Recheck in 6 weeks.

## 2019-07-21 ENCOUNTER — Ambulatory Visit (INDEPENDENT_AMBULATORY_CARE_PROVIDER_SITE_OTHER): Payer: Medicare Other | Admitting: Internal Medicine

## 2019-07-21 ENCOUNTER — Other Ambulatory Visit: Payer: Self-pay

## 2019-07-21 ENCOUNTER — Encounter: Payer: Self-pay | Admitting: Internal Medicine

## 2019-07-21 VITALS — BP 128/74 | HR 77 | Ht 68.0 in | Wt 180.2 lb

## 2019-07-21 DIAGNOSIS — I442 Atrioventricular block, complete: Secondary | ICD-10-CM | POA: Diagnosis not present

## 2019-07-21 DIAGNOSIS — Z950 Presence of cardiac pacemaker: Secondary | ICD-10-CM | POA: Diagnosis not present

## 2019-07-21 NOTE — Patient Instructions (Signed)

## 2019-07-21 NOTE — Progress Notes (Signed)
HPI Mr. Boesen today for ongoing evaluation and management of complete heart block status post pacemaker insertion. He is a very pleasant 85 year old man with a history of coronary artery disease status post bypass surgery, hypertension, diabetes, and dyslipidemia who is s/p PPM insertion for CHB.His symptoms have resolved. He denies chest pain or sob.  Allergies  Allergen Reactions  . Penicillins Other (See Comments)    Taste buds     Current Outpatient Medications  Medication Sig Dispense Refill  . aspirin EC 81 MG tablet Take 81 mg by mouth daily.    Marland Kitchen atorvastatin (LIPITOR) 20 MG tablet Take 1 tablet daily at 6 P.M. Needs office visit for more refills. 90 tablet 0  . carvedilol (COREG) 6.25 MG tablet TAKE 1 TABLET TWICE A DAY WITH MEALS 180 tablet 4  . cholecalciferol (VITAMIN D) 1000 UNITS tablet Take 1,000 Units by mouth daily.    Marland Kitchen HYDROcodone-acetaminophen (NORCO/VICODIN) 5-325 MG per tablet Take 1 tablet by mouth every 6 (six) hours as needed for moderate pain.     . metFORMIN (GLUCOPHAGE-XR) 500 MG 24 hr tablet TAKE 1 TABLET TWICE A DAY 180 tablet 3  . Multiple Vitamin (MULTIVITAMIN WITH MINERALS) TABS tablet Take 1 tablet by mouth daily.    . niacin (NIASPAN) 1000 MG CR tablet TAKE 1 TABLET AT BEDTIME 90 tablet 3  . sildenafil (REVATIO) 20 MG tablet Take 1 tablet (20 mg total) by mouth 3 (three) times daily as needed. 90 tablet 3  . warfarin (COUMADIN) 5 MG tablet Take as directed by the coumadin clinic. 30 tablet 1  . celecoxib (CELEBREX) 200 MG capsule Take 1 capsule (200 mg total) by mouth 2 (two) times daily. 180 capsule 1   No current facility-administered medications for this visit.      Past Medical History:  Diagnosis Date  . Colonic polyp 2011 last colo   colo q 61yr - [facility]   . Coronary artery disease 02/2009   severe left main and three-vessel, CABG  . DEGENERATIVE JOINT DISEASE   . Diverticulosis of colon   . DIVERTICULOSIS, COLON   .  HYPERLIPIDEMIA-MIXED   . HYPERTENSION, BENIGN   . LBP (low back pain)    s/p decompression 8/14; ESI - [place]; RFA x 3 early 2015  . Pacemaker    a. 05/2017: St. Jude (serial number [id number]) pacemaker  . Symptomatic bradycardia    a. s/p St. Jude (serial number [id number]) pacemaker 06/16/17  . Type II or unspecified type diabetes mellitus without mention of complication, not stated as uncontrolled   . VITAMIN D DEFICIENCY     ROS:   All systems reviewed and negative except as noted in the HPI.   Past Surgical History:  Procedure Laterality Date  . CARPAL TUNNEL RELEASE Right   . CATARACT EXTRACTION, BILATERAL     R 07/07/13, L 07/21/13  . CORONARY ARTERY BYPASS GRAFT  03/17/09   x 4  . INGUINAL HERNIA REPAIR  1980  . KNEE ARTHROSCOPY  2007   right  . LUMBAR EPIDURAL INJECTION  2013   Has as needed.  . LUMBAR LAMINECTOMY/DECOMPRESSION MICRODISCECTOMY N/A 06/19/2013   Procedure: LUMBAR LAMINECTOMY/DECOMPRESSION MICRODISCECTOMY;  Surgeon: Sinclair Ship, MD;  Location: [facility];  Service: Orthopedics;  Laterality: N/A;  Lumbar 4-5 decompression  . PACEMAKER IMPLANT N/A 06/16/2017   Procedure: Pacemaker Implant;  Surgeon: Evans Lance, MD;  Location: [facility];  Service: Cardiovascular;  Laterality: N/A;  . TRIGGER FINGER  RELEASE Right      Family History  Problem Relation Age of Onset  . Heart failure Mother        died age 80  . Heart failure Father        died age 73  . Diabetes Other   . Heart disease Neg Hx      Social History   Socioeconomic History  . Marital status: Married    Spouse name: Not on file  . Number of children: Not on file  . Years of education: Not on file  . Highest education level: Not on file  Occupational History  . Not on file  Social Needs  . Financial resource strain: Not on file  . Food insecurity    Worry: Not on file    Inability: Not on file  . Transportation needs    Medical: Not on file    Non-medical: Not on file   Tobacco Use  . Smoking status: Never Smoker  . Smokeless tobacco: Never Used  . Tobacco comment: Married, 3 grown children. Retired 04/2002 from [employer] work  Substance and Sexual Activity  . Alcohol use: Yes    Comment: RARE  . Drug use: No  . Sexual activity: Not on file  Lifestyle  . Physical activity    Days per week: Not on file    Minutes per session: Not on file  . Stress: Not on file  Relationships  . Social Musician on phone: Not on file    Gets together: Not on file    Attends religious service: Not on file    Active member of club or organization: Not on file    Attends meetings of clubs or organizations: Not on file    Relationship status: Not on file  . Intimate partner violence    Fear of current or ex partner: Not on file    Emotionally abused: Not on file    Physically abused: Not on file    Forced sexual activity: Not on file  Other Topics Concern  . Not on file  Social History Narrative  . Not on file     BP 128/74   Pulse 77   Ht 5\' 8"  (1.727 m)   Wt 180 lb 3.2 oz (81.7 kg)   SpO2 96%   BMI 27.40 kg/m   Physical Exam:  Well appearing NAD HEENT: Unremarkable Neck:  No JVD, no thyromegally Lymphatics:  No adenopathy Back:  No CVA tenderness Lungs:  Clear with no wheezes HEART:  Regular rate rhythm, no murmurs, no rubs, no clicks Abd:  soft, positive bowel sounds, no organomegally, no rebound, no guarding Ext:  2 plus pulses, no edema, no cyanosis, no clubbing Skin:  No rashes no nodules Neuro:  CN II through XII intact, motor grossly intact  DEVICE  Normal device function.  See PaceArt for details.   Assess/Plan: 1. PPM - his St. Jude DDD PM is working normally.  2. CHB - he is s/p PPM insertion and asymptomatic. 3. HTN - his bp is well controlled.  .D.

## 2019-07-28 ENCOUNTER — Other Ambulatory Visit: Payer: Self-pay | Admitting: Internal Medicine

## 2019-08-01 ENCOUNTER — Other Ambulatory Visit: Payer: Self-pay | Admitting: Internal Medicine

## 2019-08-08 LAB — CUP PACEART INCLINIC DEVICE CHECK
Battery Remaining Longevity: 134 mo
Battery Voltage: 3.01 V
Brady Statistic RA Percent Paced: 17 %
Brady Statistic RV Percent Paced: 99.93 %
Date Time Interrogation Session: 20200928194615
Implantable Lead Implant Date: 20180825
Implantable Lead Implant Date: 20180825
Implantable Lead Location: 753859
Implantable Lead Location: 753860
Implantable Pulse Generator Implant Date: 20180825
Lead Channel Impedance Value: 512.5 Ohm
Lead Channel Impedance Value: 750 Ohm
Lead Channel Pacing Threshold Amplitude: 0.5 V
Lead Channel Pacing Threshold Amplitude: 0.625 V
Lead Channel Pacing Threshold Pulse Width: 0.5 ms
Lead Channel Pacing Threshold Pulse Width: 0.5 ms
Lead Channel Sensing Intrinsic Amplitude: 4.5 mV
Lead Channel Sensing Intrinsic Amplitude: 4.7 mV
Lead Channel Setting Pacing Amplitude: 0.875
Lead Channel Setting Pacing Amplitude: 1.5 V
Lead Channel Setting Pacing Pulse Width: 0.5 ms
Lead Channel Setting Sensing Sensitivity: 4 mV
Pulse Gen Model: 2272
Pulse Gen Serial Number: 8939923

## 2019-08-24 NOTE — Progress Notes (Signed)
Cardiology Office Note  Date:  08/26/2019   ID:  Andrew [name], DOB 12-22-1933, MRN [id number]  PCP:  Binnie Rail, MD   Chief Complaint  Patient presents with  . Other    12 month follow up. patient denies chest pain and SOB at this time. Meds reviewed verbally with patient    HPI:  Andrew [name] is a very pleasant 85 year old gentleman with history of  coronary artery disease,  bypass in May 2010, Chronic hip and back pain, can't walk hx of near syncope  hyperlipidemia,  diabetes,  hypertension,  complete heart block 06/15/2017, pacer Prior DVT , on long-term anticoagulation, who presents for follow-up of his coronary artery disease  Spending a lot of time at home Used to do salt water fishing No exercise Back pain, limiting ability to exert himself and exercise Can work for one hour then stops, back pain On celebrex,   On Viagra for erectile dysfunction.   No new chest pain, shortness of breath on exertion  Previous total cholesterol 131, LDL 66 HAB1C 6.8  EKG personally reviewed by myself on todays visit Paced rhythm rate 68 bpm  Previous hospital records reviewed hospitalization for complete heart block 06/15/2017 Bradycardic heart rate in the 20 to 30s St. Jude pacemaker placed June 11 2017, by Dr. Lovena Le  Stress test: 12/27/2016: no ischemia Results reviewed with him in detail  Event monitor 12/21/2016:  Results reviewed with him in detail Normal sinus rhythm Min HR of 43 bpm, max HR of 117 bpm, and avg HR of 72 bpm.  Rare APCs and PVCs likely causing symptoms No other significnat arrhythmia  History of a pinched nerve, back surgery 06/19/2013. He continues to have back pain, status post radiofrequency treatments x3 through [facility] orth[facility].     PMH:   has a past medical history of Colonic polyp (2011 last colo), Coronary artery disease (02/2009), DEGENERATIVE JOINT DISEASE, Diverticulosis of colon, DIVERTICULOSIS, COLON, HYPERLIPIDEMIA-MIXED,  HYPERTENSION, BENIGN, LBP (low back pain), Pacemaker, Symptomatic bradycardia, Type II or unspecified type diabetes mellitus without mention of complication, not stated as uncontrolled, and VITAMIN D DEFICIENCY.  PSH:    Past Surgical History:  Procedure Laterality Date  . CARPAL TUNNEL RELEASE Right   . CATARACT EXTRACTION, BILATERAL     R 07/07/13, L 07/21/13  . CORONARY ARTERY BYPASS GRAFT  03/17/09   x 4  . INGUINAL HERNIA REPAIR  1980  . KNEE ARTHROSCOPY  2007   right  . LUMBAR EPIDURAL INJECTION  2013   Has as needed.  . LUMBAR LAMINECTOMY/DECOMPRESSION MICRODISCECTOMY N/A 06/19/2013   Procedure: LUMBAR LAMINECTOMY/DECOMPRESSION MICRODISCECTOMY;  Surgeon: Sinclair Ship, MD;  Location: [facility] [facility];  Service: Orthopedics;  Laterality: N/A;  Lumbar 4-5 decompression  . PACEMAKER IMPLANT N/A 06/16/2017   Procedure: Pacemaker Implant;  Surgeon: Evans Lance, MD;  Location: [facility];  Service: Cardiovascular;  Laterality: N/A;  . TRIGGER FINGER RELEASE Right     Current Outpatient Medications  Medication Sig Dispense Refill  . aspirin EC 81 MG tablet Take 81 mg by mouth daily.    Marland Kitchen atorvastatin (LIPITOR) 20 MG tablet TAKE 1 TABLET DAILY AT 6 P.M. (NEED OFFICE VISIT FOR MORE REFILLS) 90 tablet 1  . carvedilol (COREG) 6.25 MG tablet TAKE 1 TABLET TWICE A DAY WITH MEALS 180 tablet 4  . celecoxib (CELEBREX) 200 MG capsule Take 1 capsule (200 mg total) by mouth 2 (two) times daily. 180 capsule 1  . cholecalciferol (VITAMIN D) 1000 UNITS  tablet Take 1,000 Units by mouth daily.    Marland Kitchen HYDROcodone-acetaminophen (NORCO/VICODIN) 5-325 MG per tablet Take 1 tablet by mouth every 6 (six) hours as needed for moderate pain.     . metFORMIN (GLUCOPHAGE-XR) 500 MG 24 hr tablet TAKE 1 TABLET TWICE A DAY 180 tablet 3  . Multiple Vitamin (MULTIVITAMIN WITH MINERALS) TABS tablet Take 1 tablet by mouth daily.    . niacin (NIASPAN) 1000 MG CR tablet TAKE 1 TABLET AT BEDTIME 90 tablet 3  .  sildenafil (REVATIO) 20 MG tablet Take 1 tablet (20 mg total) by mouth 3 (three) times daily as needed. 90 tablet 3  . warfarin (COUMADIN) 5 MG tablet Take as directed by the coumadin clinic. 30 tablet 1   No current facility-administered medications for this visit.      Allergies:   Penicillins   Social History:  The patient  reports that he has never smoked. He has never used smokeless tobacco. He reports current alcohol use. He reports that he does not use drugs.   Family History:   family history includes Diabetes in an other family member; Heart failure in his father and mother.    Review of Systems: Review of Systems  Constitutional: Negative.   Respiratory: Negative.   Cardiovascular: Negative.   Gastrointestinal: Negative.   Musculoskeletal: Positive for back pain and joint pain.  Neurological: Negative.   Psychiatric/Behavioral: Negative.   All other systems reviewed and are negative.   PHYSICAL EXAM: VS:  BP 120/60 (BP Location: Right Arm, Patient Position: Sitting, Cuff Size: Normal)   Pulse 68   Ht 5\' 8"  (1.727 m)   Wt 183 lb (83 kg)   BMI 27.83 kg/m  , BMI Body mass index is 27.83 kg/m. Constitutional:  oriented to person, place, and time. No distress.  HENT:  Head: Grossly normal Eyes:  no discharge. No scleral icterus.  Neck: No JVD, no carotid bruits  Cardiovascular: Regular rate and rhythm, no murmurs appreciated Pulmonary/Chest: Clear to auscultation bilaterally, no wheezes or rails Abdominal: Soft.  no distension.  no tenderness.  Musculoskeletal: Normal range of motion Neurological:  normal muscle tone. Coordination normal. No atrophy Skin: Skin warm and dry Psychiatric: normal affect, pleasant   Recent Labs: 04/01/2019: ALT 14; BUN 31; Creatinine, Ser 1.38; Hemoglobin 12.8; Platelets 207.0; Potassium 4.4; Sodium 141    Lipid Panel Lab Results  Component Value Date   CHOL 131 04/01/2019   HDL 38.90 (L) 04/01/2019   LDLCALC 66 04/01/2019    TRIG 130.0 04/01/2019      Wt Readings from Last 3 Encounters:  08/26/19 183 lb (83 kg)  07/21/19 180 lb 3.2 oz (81.7 kg)  04/01/19 182 lb (82.6 kg)     ASSESSMENT AND PLAN:  Pure hypercholesterolemia -  Cholesterol is at goal on the current lipid regimen. No changes to the medications were made.  DVT dx with DVT on u/s on 10/2, on warfarin Etiology unclear Recurrent episodes  HYPERTENSION, BENIGN -  Blood pressure stable, no medication changes made Stable  Atherosclerosis of coronary artery bypass graft of native heart with angina pectoris (HCC) -  Prior stress test with no ischemia Recommend regular exercise program No further testing ordered  Type 2 diabetes mellitus with neurological manifestations, controlled (HCC) Unable to exercise secondary to chronic back pain and arthritic issues Recommended low carbohydrate diet He will work on his weight through dietary changes  Near syncope No recent episodes of orthostasis Blood pressure stable.  No further work-up  Complete heart block,pacemaker Seen by Dr. Ladona Ridgelaylor August 2019 Has annual follow-up  Erectile dysfunction Requesting refill of his Viagra, 1% 10  Arthritis Still on Celebrex   Total encounter time more than 25 minutes  Greater than 50% was spent in counseling and coordination of care with the patient  Follow-up 12 months   Signed, Dossie Arbourim Gearl Kimbrough, M.D., Ph.D. 08/26/2019  [facility] [place] [phone]

## 2019-08-26 ENCOUNTER — Ambulatory Visit (INDEPENDENT_AMBULATORY_CARE_PROVIDER_SITE_OTHER): Payer: Medicare Other | Admitting: Cardiovascular Disease

## 2019-08-26 ENCOUNTER — Encounter: Payer: Self-pay | Admitting: Cardiovascular Disease

## 2019-08-26 ENCOUNTER — Other Ambulatory Visit: Payer: Self-pay

## 2019-08-26 VITALS — BP 120/60 | HR 68 | Ht 68.0 in | Wt 183.0 lb

## 2019-08-26 DIAGNOSIS — I25708 Atherosclerosis of coronary artery bypass graft(s), unspecified, with other forms of angina pectoris: Secondary | ICD-10-CM

## 2019-08-26 DIAGNOSIS — I442 Atrioventricular block, complete: Secondary | ICD-10-CM

## 2019-08-26 DIAGNOSIS — Z950 Presence of cardiac pacemaker: Secondary | ICD-10-CM | POA: Diagnosis not present

## 2019-08-26 DIAGNOSIS — E1149 Type 2 diabetes mellitus with other diabetic neurological complication: Secondary | ICD-10-CM

## 2019-08-26 DIAGNOSIS — I82409 Acute embolism and thrombosis of unspecified deep veins of unspecified lower extremity: Secondary | ICD-10-CM

## 2019-08-26 MED ORDER — SILDENAFIL CITRATE 20 MG PO TABS: 20.0000 mg | ORAL_TABLET | Freq: Three times a day (TID) | ORAL | 3 refills | Status: DC | PRN

## 2019-08-26 NOTE — Patient Instructions (Signed)

## 2019-08-27 ENCOUNTER — Ambulatory Visit (INDEPENDENT_AMBULATORY_CARE_PROVIDER_SITE_OTHER): Payer: Medicare Other

## 2019-08-27 DIAGNOSIS — I82409 Acute embolism and thrombosis of unspecified deep veins of unspecified lower extremity: Secondary | ICD-10-CM

## 2019-08-27 DIAGNOSIS — Z5181 Encounter for therapeutic drug level monitoring: Secondary | ICD-10-CM

## 2019-08-27 LAB — POCT INR: INR: 3.4 — AB (ref 2.0–3.0)

## 2019-08-27 NOTE — Patient Instructions (Signed)
Please skip warfarin tonight, then continue current dosage of 2.5 mg daily except 5 mg on MONDAYS, [place]. Please be consistent w/ your green leafy vegetables.  Recheck in 5 weeks.

## 2019-08-28 NOTE — Addendum Note (Signed)
Addended by: Janan Ridge on: 08/28/2019 03:59 PM   Modules accepted: Orders

## 2019-09-10 ENCOUNTER — Telehealth: Payer: Self-pay | Admitting: Cardiovascular Disease

## 2019-09-10 MED ORDER — WARFARIN SODIUM 5 MG PO TABS: ORAL_TABLET | ORAL | 1 refills | Status: DC

## 2019-09-10 NOTE — Telephone Encounter (Signed)
°*  STAT* If patient is at the pharmacy, call can be transferred to refill team.   1. Which medications need to be refilled? (please list name of each medication and dose if known)  Warfarin (coumadin) 5 MG   2. Which pharmacy/location (including street and city if local pharmacy) is medication to be sent to? [facility] on [address]   3. Do they need a 30 day or 90 day supply? 30 day

## 2019-09-10 NOTE — Telephone Encounter (Signed)
Refill sent in as requested. 

## 2019-09-15 LAB — CUP PACEART REMOTE DEVICE CHECK
Battery Remaining Longevity: 132 mo
Battery Remaining Percentage: 95.5 %
Battery Voltage: 3.01 V
Brady Statistic AP VP Percent: 19 %
Brady Statistic AP VS Percent: 1 %
Brady Statistic AS VP Percent: 81 %
Brady Statistic AS VS Percent: 1 %
Brady Statistic RA Percent Paced: 18 %
Brady Statistic RV Percent Paced: 99 %
Date Time Interrogation Session: 20201123065400
Implantable Lead Implant Date: 20180825
Implantable Lead Implant Date: 20180825
Implantable Lead Location: 753859
Implantable Lead Location: 753860
Implantable Pulse Generator Implant Date: 20180825
Lead Channel Impedance Value: 510 Ohm
Lead Channel Impedance Value: 680 Ohm
Lead Channel Pacing Threshold Amplitude: 0.5 V
Lead Channel Pacing Threshold Amplitude: 0.625 V
Lead Channel Pacing Threshold Pulse Width: 0.5 ms
Lead Channel Pacing Threshold Pulse Width: 0.5 ms
Lead Channel Sensing Intrinsic Amplitude: 10.5 mV
Lead Channel Sensing Intrinsic Amplitude: 4.2 mV
Lead Channel Setting Pacing Amplitude: 0.875
Lead Channel Setting Pacing Amplitude: 1.5 V
Lead Channel Setting Pacing Pulse Width: 0.5 ms
Lead Channel Setting Sensing Sensitivity: 4 mV
Pulse Gen Model: 2272
Pulse Gen Serial Number: 8939923

## 2019-09-17 ENCOUNTER — Ambulatory Visit (INDEPENDENT_AMBULATORY_CARE_PROVIDER_SITE_OTHER): Payer: Medicare Other | Admitting: *Deleted

## 2019-09-17 DIAGNOSIS — I442 Atrioventricular block, complete: Secondary | ICD-10-CM | POA: Diagnosis not present

## 2019-09-30 NOTE — Progress Notes (Signed)
Subjective:    [name], male    DOB: September 04, 1934, 85 y.o.   MRN: [id number]  HPI The patient is here for follow up.  He is very active with yard work and splitting wood.  Diabetes: He is taking his medication daily as prescribed. He is compliant with a diabetic diet.   He checks his feet daily and denies foot lesions. He is up-to-date with an ophthalmology examination.   CAD, Hypertension: He is taking his medication daily. He is compliant with a low sodium diet.  He denies chest pain, palpitations, edema, shortness of breath and regular headaches.    H/o DVT on warfarin:  His warfarin is managed by cardiology.   Hyperlipidemia: He is taking his medication daily. He is compliant with a low fat/cholesterol diet. He denies myalgias.   Osteoarthritis:   He takes celebrex BID.  His arthritis pain is controlled.       Medications and allergies reviewed with patient and updated if appropriate.  Patient Active Problem List   Diagnosis Date Noted  . Pacemaker 07/21/2019  . DVT (deep venous thrombosis) (HCC) 08/28/2017  . Complete heart block (HCC) 06/15/2017  . Chronic fatigue 01/20/2017  . Wears hearing aid 09/25/2016  . Degeneration of lumbar or lumbosacral intervertebral disc 03/23/2015  . Orthostatic hypotension 03/27/2013  . Generalized muscle ache 05/28/2012  . Vitamin D deficiency 11/03/2010  . DIVERTICULOSIS, COLON 11/03/2010  . Osteoarthritis (arthritis due to wear and tear of joints) 11/03/2010  . Hyperlipidemia 12/01/2009  . HYPERTENSION, BENIGN 12/01/2009  . CAD, ARTERY BYPASS GRAFT 12/01/2009  . Type 2 diabetes mellitus with neurological manifestations, controlled (HCC)     Current Outpatient Medications on File Prior to Visit  Medication Sig Dispense Refill  . aspirin EC 81 MG tablet Take 81 mg by mouth daily.    Marland Kitchen atorvastatin (LIPITOR) 20 MG tablet TAKE 1 TABLET DAILY AT 6 P.M. (NEED OFFICE VISIT FOR MORE REFILLS) 90 tablet 1  . carvedilol  (COREG) 6.25 MG tablet TAKE 1 TABLET TWICE A DAY WITH MEALS 180 tablet 4  . celecoxib (CELEBREX) 200 MG capsule Take 1 capsule (200 mg total) by mouth 2 (two) times daily. 180 capsule 1  . cholecalciferol (VITAMIN D) 1000 UNITS tablet Take 1,000 Units by mouth daily.    Marland Kitchen HYDROcodone-acetaminophen (NORCO/VICODIN) 5-325 MG per tablet Take 1 tablet by mouth every 6 (six) hours as needed for moderate pain.     . metFORMIN (GLUCOPHAGE-XR) 500 MG 24 hr tablet TAKE 1 TABLET TWICE A DAY 180 tablet 3  . Multiple Vitamin (MULTIVITAMIN WITH MINERALS) TABS tablet Take 1 tablet by mouth daily.    . niacin (NIASPAN) 1000 MG CR tablet TAKE 1 TABLET AT BEDTIME 90 tablet 3  . sildenafil (REVATIO) 20 MG tablet Take 1 tablet (20 mg total) by mouth 3 (three) times daily as needed. 90 tablet 3  . warfarin (COUMADIN) 5 MG tablet Take as directed by the coumadin clinic. 30 tablet 1   No current facility-administered medications on file prior to visit.     Past Medical History:  Diagnosis Date  . Colonic polyp 2011 last colo   colo q 26yr - [facility]   . Coronary artery disease 02/2009   severe left main and three-vessel, CABG  . DEGENERATIVE JOINT DISEASE   . Diverticulosis of colon   . DIVERTICULOSIS, COLON   . HYPERLIPIDEMIA-MIXED   . HYPERTENSION, BENIGN   . LBP (low back pain)    s/p decompression  8/14; ESI - [place]; RFA x 3 early 2015  . Pacemaker    a. 05/2017: St. Jude (serial number [id number]) pacemaker  . Symptomatic bradycardia    a. s/p St. Jude (serial number [id number]) pacemaker 06/16/17  . Type II or unspecified type diabetes mellitus without mention of complication, not stated as uncontrolled   . VITAMIN D DEFICIENCY     Past Surgical History:  Procedure Laterality Date  . CARPAL TUNNEL RELEASE Right   . CATARACT EXTRACTION, BILATERAL     R 07/07/13, L 07/21/13  . CORONARY ARTERY BYPASS GRAFT  03/17/09   x 4  . INGUINAL HERNIA REPAIR  1980  . KNEE ARTHROSCOPY  2007   right  . LUMBAR EPIDURAL  INJECTION  2013   Has as needed.  . LUMBAR LAMINECTOMY/DECOMPRESSION MICRODISCECTOMY N/A 06/19/2013   Procedure: LUMBAR LAMINECTOMY/DECOMPRESSION MICRODISCECTOMY;  Surgeon: Sinclair Ship, MD;  Location: [facility];  Service: Orthopedics;  Laterality: N/A;  Lumbar 4-5 decompression  . PACEMAKER IMPLANT N/A 06/16/2017   Procedure: Pacemaker Implant;  Surgeon: Evans Lance, MD;  Location: [facility];  Service: Cardiovascular;  Laterality: N/A;  . TRIGGER FINGER RELEASE Right     Social History   Socioeconomic History  . Marital status: Married    Spouse name: Not on file  . Number of children: Not on file  . Years of education: Not on file  . Highest education level: Not on file  Occupational History  . Not on file  Social Needs  . Financial resource strain: Not on file  . Food insecurity    Worry: Not on file    Inability: Not on file  . Transportation needs    Medical: Not on file    Non-medical: Not on file  Tobacco Use  . Smoking status: Never Smoker  . Smokeless tobacco: Never Used  . Tobacco comment: Married, 3 grown children. Retired 04/2002 from [employer] work  Substance and Sexual Activity  . Alcohol use: Yes    Comment: RARE  . Drug use: No  . Sexual activity: Not on file  Lifestyle  . Physical activity    Days per week: Not on file    Minutes per session: Not on file  . Stress: Not on file  Relationships  . Social Herbalist on phone: Not on file    Gets together: Not on file    Attends religious service: Not on file    Active member of club or organization: Not on file    Attends meetings of clubs or organizations: Not on file    Relationship status: Not on file  Other Topics Concern  . Not on file  Social History Narrative  . Not on file    Family History  Problem Relation Age of Onset  . Heart failure Mother        died age 33  . Heart failure Father        died age 12  . Diabetes Other   . Heart disease Neg  Hx     Review of Systems  Constitutional: Negative for chills and fever.  Respiratory: Negative for cough, shortness of breath and wheezing.   Cardiovascular: Negative for chest pain, palpitations and leg swelling.  Neurological: Negative for light-headedness, numbness and headaches.       Objective:   Vitals:   10/01/19 0756  BP: 122/68  Pulse: 84  Resp: 16  Temp: 98.3 F (36.8 C)  SpO2:  99%   BP Readings from Last 3 Encounters:  10/01/19 122/68  08/26/19 120/60  07/21/19 128/74   Wt Readings from Last 3 Encounters:  10/01/19 181 lb (82.1 kg)  08/26/19 183 lb (83 kg)  07/21/19 180 lb 3.2 oz (81.7 kg)   Body mass index is 27.52 kg/m.   Physical Exam    Constitutional: Appears well-developed and well-nourished. No distress.  HENT:  Head: Normocephalic and atraumatic.  Neck: Neck supple. No tracheal deviation present. No thyromegaly present.  No cervical lymphadenopathy Cardiovascular: Normal rate, regular rhythm and normal heart sounds.   2/6 systolic  murmur heard. No carotid bruit .  No edema Pulmonary/Chest: Effort normal and breath sounds normal. No respiratory distress. No has no wheezes. No rales.  Skin: Skin is warm and dry. Not diaphoretic.  Psychiatric: Normal mood and affect. Behavior is normal.   He deferred foot exam.     Assessment & Plan:    See Problem List for Assessment and Plan of chronic medical problems.   This visit occurred during the SARS-CoV-2 public health emergency.  Safety protocols were in place, including screening questions prior to the visit, additional usage of staff PPE, and extensive cleaning of exam room while observing appropriate contact time as indicated for disinfecting solutions.

## 2019-09-30 NOTE — Patient Instructions (Addendum)
  Tests ordered today. Your results will be released to MyChart (or called to you) after review.  If any changes need to be made, you will be notified at that same time.    Medications reviewed and updated.  Changes include :   none     Please followup in 6 months   

## 2019-10-01 ENCOUNTER — Ambulatory Visit (INDEPENDENT_AMBULATORY_CARE_PROVIDER_SITE_OTHER): Payer: Medicare Other | Admitting: Internal Medicine

## 2019-10-01 ENCOUNTER — Encounter: Payer: Self-pay | Admitting: Internal Medicine

## 2019-10-01 ENCOUNTER — Other Ambulatory Visit: Payer: Self-pay

## 2019-10-01 ENCOUNTER — Other Ambulatory Visit (INDEPENDENT_AMBULATORY_CARE_PROVIDER_SITE_OTHER): Payer: Medicare Other

## 2019-10-01 ENCOUNTER — Ambulatory Visit (INDEPENDENT_AMBULATORY_CARE_PROVIDER_SITE_OTHER): Payer: Medicare Other

## 2019-10-01 VITALS — BP 122/68 | HR 84 | Temp 98.3°F | Resp 16 | Ht 68.0 in | Wt 181.0 lb

## 2019-10-01 DIAGNOSIS — I82402 Acute embolism and thrombosis of unspecified deep veins of left lower extremity: Secondary | ICD-10-CM

## 2019-10-01 DIAGNOSIS — Z5181 Encounter for therapeutic drug level monitoring: Secondary | ICD-10-CM

## 2019-10-01 DIAGNOSIS — E1149 Type 2 diabetes mellitus with other diabetic neurological complication: Secondary | ICD-10-CM

## 2019-10-01 DIAGNOSIS — I10 Essential (primary) hypertension: Secondary | ICD-10-CM

## 2019-10-01 DIAGNOSIS — I25708 Atherosclerosis of coronary artery bypass graft(s), unspecified, with other forms of angina pectoris: Secondary | ICD-10-CM

## 2019-10-01 DIAGNOSIS — M159 Polyosteoarthritis, unspecified: Secondary | ICD-10-CM

## 2019-10-01 DIAGNOSIS — I82409 Acute embolism and thrombosis of unspecified deep veins of unspecified lower extremity: Secondary | ICD-10-CM | POA: Diagnosis not present

## 2019-10-01 DIAGNOSIS — E782 Mixed hyperlipidemia: Secondary | ICD-10-CM

## 2019-10-01 LAB — COMPREHENSIVE METABOLIC PANEL
ALT: 18 U/L (ref 0–53)
AST: 19 U/L (ref 0–37)
Albumin: 4.3 g/dL (ref 3.5–5.2)
Alkaline Phosphatase: 49 U/L (ref 39–117)
BUN: 31 mg/dL — ABNORMAL HIGH (ref 6–23)
CO2: 26 mEq/L (ref 19–32)
Calcium: 9.6 mg/dL (ref 8.4–10.5)
Chloride: 106 mEq/L (ref 96–112)
Creatinine, Ser: 1.33 mg/dL (ref 0.40–1.50)
GFR: 51.31 mL/min — ABNORMAL LOW (ref >60.00)
Glucose, Bld: 139 mg/dL — ABNORMAL HIGH (ref 70–99)
Potassium: 4.3 mEq/L (ref 3.5–5.1)
Sodium: 141 mEq/L (ref 135–145)
Total Bilirubin: 0.5 mg/dL (ref 0.2–1.2)
Total Protein: 7.1 g/dL (ref 6.0–8.3)

## 2019-10-01 LAB — TSH: TSH: 1.12 u[IU]/mL (ref 0.35–4.50)

## 2019-10-01 LAB — CBC WITH DIFFERENTIAL/PLATELET
Basophils Absolute: 0 10*3/uL (ref 0.0–0.1)
Basophils Relative: 0.4 % (ref 0.0–3.0)
Eosinophils Absolute: 0.2 10*3/uL (ref 0.0–0.7)
Eosinophils Relative: 3.8 % (ref 0.0–5.0)
HCT: 38.7 % — ABNORMAL LOW (ref 39.0–52.0)
Hemoglobin: 13.2 g/dL (ref 13.0–17.0)
Lymphocytes Relative: 28.9 % (ref 12.0–46.0)
Lymphs Abs: 1.8 10*3/uL (ref 0.7–4.0)
MCHC: 34.2 g/dL (ref 30.0–36.0)
MCV: 102.4 fl — ABNORMAL HIGH (ref 78.0–100.0)
Monocytes Absolute: 0.5 10*3/uL (ref 0.1–1.0)
Monocytes Relative: 7.8 % (ref 3.0–12.0)
Neutro Abs: 3.8 10*3/uL (ref 1.4–7.7)
Neutrophils Relative %: 59.1 % (ref 43.0–77.0)
Platelets: 202 10*3/uL (ref 150.0–400.0)
RBC: 3.78 Mil/uL — ABNORMAL LOW (ref 4.22–5.81)
RDW: 14.1 % (ref 11.5–15.5)
WBC: 6.4 10*3/uL (ref 4.0–10.5)

## 2019-10-01 LAB — LIPID PANEL
Cholesterol: 136 mg/dL (ref 0–200)
HDL: 49.7 mg/dL (ref >39.00)
LDL Cholesterol: 48 mg/dL (ref 0–99)
NonHDL: 86.53
Total CHOL/HDL Ratio: 3
Triglycerides: 192 mg/dL — ABNORMAL HIGH (ref 0.0–149.0)
VLDL: 38.4 mg/dL (ref 0.0–40.0)

## 2019-10-01 LAB — MICROALBUMIN / CREATININE URINE RATIO
Creatinine,U: 122.1 mg/dL
Microalb Creat Ratio: 0.8 mg/g (ref 0.0–30.0)
Microalb, Ur: 1 mg/dL (ref 0.0–1.9)

## 2019-10-01 LAB — HEMOGLOBIN A1C: Hgb A1c MFr Bld: 6.6 % — ABNORMAL HIGH (ref 4.6–6.5)

## 2019-10-01 LAB — POCT INR: INR: 3.1 — AB (ref 2.0–3.0)

## 2019-10-01 NOTE — Assessment & Plan Note (Signed)
Check a1c Low sugar / carb diet Continue metformin

## 2019-10-01 NOTE — Patient Instructions (Signed)
Please take 1/2 tablet tonight, then continue current dosage of 2.5 mg daily except 5 mg on MONDAYS, [place]. Please be consistent w/ your green leafy vegetables.  Recheck in 6 weeks.

## 2019-10-01 NOTE — Assessment & Plan Note (Signed)
Check lipid panel  Continue daily statin Regular exercise and healthy diet encouraged  

## 2019-10-01 NOTE — Assessment & Plan Note (Signed)
H/o DVT on chronic warfarin - managed by cardio Cbc, cmp

## 2019-10-01 NOTE — Assessment & Plan Note (Signed)
BP well controlled Current regimen effective and well tolerated Continue current medications at current doses cmp  

## 2019-10-01 NOTE — Assessment & Plan Note (Signed)
Follows with cardio No concerning symptoms Continue current meds Lipids, cmp, cbc, tsh

## 2019-10-01 NOTE — Assessment & Plan Note (Signed)
Continue celebrex twice daily

## 2019-10-02 ENCOUNTER — Encounter: Payer: Self-pay | Admitting: Internal Medicine

## 2019-10-14 NOTE — Progress Notes (Signed)
PPM remote 

## 2019-10-29 ENCOUNTER — Other Ambulatory Visit: Payer: Self-pay

## 2019-10-31 ENCOUNTER — Telehealth: Payer: Self-pay | Admitting: Internal Medicine

## 2019-10-31 NOTE — Telephone Encounter (Signed)
Copied from CRM [id number]. Topic: Quick Communication - Rx Refill/Question >> Oct 31, 2019  5:30 PM Mcneil, Ja-Kwan wrote: Medication: celecoxib (CELEBREX) 200 MG capsule   Has the patient contacted their pharmacy? yes   Preferred Pharmacy (with phone number or street name): [facility] [place], [place] - [address] AT Portal to [facility]istered [employer] [facility]  Phone: [phone]   Fax: [phone]  Agent: Please be advised that RX refills may take up to 3 business days. We ask that you follow-up with your pharmacy.

## 2019-11-04 MED ORDER — CELECOXIB 200 MG PO CAPS: 200.0000 mg | ORAL_CAPSULE | Freq: Two times a day (BID) | ORAL | 1 refills | Status: DC

## 2019-11-04 NOTE — Telephone Encounter (Signed)
Spoke with pt and advised refill was sent it. Message was never sent to me the first time.

## 2019-11-04 NOTE — Telephone Encounter (Signed)
Patient is wanting to speak with someone regarding messages prior . Please advise

## 2019-11-12 ENCOUNTER — Ambulatory Visit (INDEPENDENT_AMBULATORY_CARE_PROVIDER_SITE_OTHER): Payer: Medicare Other

## 2019-11-12 ENCOUNTER — Other Ambulatory Visit: Payer: Self-pay

## 2019-11-12 DIAGNOSIS — Z5181 Encounter for therapeutic drug level monitoring: Secondary | ICD-10-CM

## 2019-11-12 DIAGNOSIS — I82409 Acute embolism and thrombosis of unspecified deep veins of unspecified lower extremity: Secondary | ICD-10-CM

## 2019-11-12 LAB — POCT INR: INR: 1.9 — AB (ref 2.0–3.0)

## 2019-11-12 NOTE — Patient Instructions (Signed)
-   Take 1.5 tablets tonight - then continue current dosage of 2.5 mg daily except 5 mg on MONDAYS, WEDNESDAYS & FRIDAYS. - Please be consistent w/ your green leafy vegetables.  - Recheck in 6 weeks.

## 2019-11-27 ENCOUNTER — Other Ambulatory Visit: Payer: Self-pay | Admitting: Cardiovascular Disease

## 2019-11-27 MED ORDER — WARFARIN SODIUM 5 MG PO TABS: ORAL_TABLET | ORAL | 0 refills | Status: DC

## 2019-11-27 NOTE — Telephone Encounter (Signed)
*  STAT* If patient is at the pharmacy, call can be transferred to refill team.   1. Which medications need to be refilled? (please list name of each medication and dose if known)  Coumadin 5 mg po as directed   2. Which pharmacy/location (including street and city if local pharmacy) is [facility] sent [facility]   3. Do they need a 30 day or 90 day supply?  90

## 2019-11-28 ENCOUNTER — Other Ambulatory Visit: Payer: Self-pay

## 2019-11-28 MED ORDER — WARFARIN SODIUM 5 MG PO TABS: ORAL_TABLET | ORAL | 0 refills | Status: DC

## 2019-12-01 ENCOUNTER — Ambulatory Visit: Payer: Medicare Other | Attending: Internal Medicine

## 2019-12-01 DIAGNOSIS — Z23 Encounter for immunization: Secondary | ICD-10-CM | POA: Insufficient documentation

## 2019-12-01 NOTE — Progress Notes (Signed)
   Covid-19 Vaccination Clinic  Name:  Casimer Russett    MRN: [id number] DOB: 1934-04-10  12/01/2019  Mr. Mudrick was observed post Covid-19 immunization for 15 minutes without incidence. He was provided with Vaccine Information Sheet and instruction to access the V-Safe system.   Mr. Sandy was instructed to call 911 with any severe reactions post vaccine: Marland Kitchen Difficulty breathing  . Swelling of your face and throat  . A fast heartbeat  . A bad rash all over your body  . Dizziness and weakness    Immunizations Administered    Name Date Dose VIS Date Route   Pfizer COVID-19 Vaccine 12/01/2019  8:58 AM 0.3 mL 10/03/2019 Intramuscular   Manufacturer: [employer], [employer]   Lot: [id number]   NDC: [address]-8

## 2019-12-05 ENCOUNTER — Other Ambulatory Visit: Payer: Self-pay

## 2019-12-05 MED ORDER — CARVEDILOL 6.25 MG PO TABS: 6.2500 mg | ORAL_TABLET | Freq: Two times a day (BID) | ORAL | 3 refills | Status: DC

## 2019-12-09 ENCOUNTER — Other Ambulatory Visit: Payer: Self-pay

## 2019-12-09 MED ORDER — ATORVASTATIN CALCIUM 20 MG PO TABS: ORAL_TABLET | ORAL | 1 refills | Status: DC

## 2019-12-09 MED ORDER — NIACIN ER (ANTIHYPERLIPIDEMIC) 1000 MG PO TBCR: 1000.0000 mg | EXTENDED_RELEASE_TABLET | Freq: Every day | ORAL | 1 refills | Status: DC

## 2019-12-09 MED ORDER — METFORMIN HCL ER 500 MG PO TB24: 500.0000 mg | ORAL_TABLET | Freq: Two times a day (BID) | ORAL | 1 refills | Status: DC

## 2019-12-17 ENCOUNTER — Ambulatory Visit (INDEPENDENT_AMBULATORY_CARE_PROVIDER_SITE_OTHER): Payer: Medicare Other | Admitting: *Deleted

## 2019-12-17 DIAGNOSIS — I442 Atrioventricular block, complete: Secondary | ICD-10-CM

## 2019-12-18 LAB — CUP PACEART REMOTE DEVICE CHECK
Battery Remaining Longevity: 131 mo
Battery Remaining Percentage: 95.5 %
Battery Voltage: 2.99 V
Brady Statistic AP VP Percent: 11 %
Brady Statistic AP VS Percent: 1 %
Brady Statistic AS VP Percent: 89 %
Brady Statistic AS VS Percent: 1 %
Brady Statistic RA Percent Paced: 10 %
Brady Statistic RV Percent Paced: 99 %
Date Time Interrogation Session: 20210225124902
Implantable Lead Implant Date: 20180825
Implantable Lead Implant Date: 20180825
Implantable Lead Location: 753859
Implantable Lead Location: 753860
Implantable Pulse Generator Implant Date: 20180825
Lead Channel Impedance Value: 490 Ohm
Lead Channel Impedance Value: 640 Ohm
Lead Channel Pacing Threshold Amplitude: 0.5 V
Lead Channel Pacing Threshold Amplitude: 0.5 V
Lead Channel Pacing Threshold Pulse Width: 0.5 ms
Lead Channel Pacing Threshold Pulse Width: 0.5 ms
Lead Channel Sensing Intrinsic Amplitude: 10.5 mV
Lead Channel Sensing Intrinsic Amplitude: 4.9 mV
Lead Channel Setting Pacing Amplitude: 0.75 V
Lead Channel Setting Pacing Amplitude: 1.5 V
Lead Channel Setting Pacing Pulse Width: 0.5 ms
Lead Channel Setting Sensing Sensitivity: 4 mV
Pulse Gen Model: 2272
Pulse Gen Serial Number: 8939923

## 2019-12-19 ENCOUNTER — Ambulatory Visit: Payer: Medicare Other

## 2019-12-24 ENCOUNTER — Ambulatory Visit (INDEPENDENT_AMBULATORY_CARE_PROVIDER_SITE_OTHER): Payer: Medicare Other

## 2019-12-24 ENCOUNTER — Other Ambulatory Visit: Payer: Self-pay

## 2019-12-24 ENCOUNTER — Ambulatory Visit: Payer: Medicare Other | Attending: Internal Medicine

## 2019-12-24 DIAGNOSIS — Z5181 Encounter for therapeutic drug level monitoring: Secondary | ICD-10-CM | POA: Diagnosis not present

## 2019-12-24 DIAGNOSIS — I82409 Acute embolism and thrombosis of unspecified deep veins of unspecified lower extremity: Secondary | ICD-10-CM | POA: Diagnosis not present

## 2019-12-24 DIAGNOSIS — Z23 Encounter for immunization: Secondary | ICD-10-CM | POA: Insufficient documentation

## 2019-12-24 LAB — POCT INR: INR: 2.4 (ref 2.0–3.0)

## 2019-12-24 NOTE — Progress Notes (Signed)
   Covid-19 Vaccination Clinic  Name:  Andrew [name]    MRN: [id number] DOB: January 19, 1934  12/24/2019  [name] was observed post Covid-19 immunization for 15 minutes without incident. He was provided with Vaccine Information Sheet and instruction to access the V-Safe system.   Andrew [name] was instructed to call 911 with any severe reactions post vaccine: Marland Kitchen Difficulty breathing  . Swelling of face and throat  . A fast heartbeat  . A bad rash all over body  . Dizziness and weakness   Immunizations Administered    Name Date Dose VIS Date Route   Pfizer COVID-19 Vaccine 12/24/2019  9:46 AM 0.3 mL 10/03/2019 Intramuscular   Manufacturer: [employer], [employer]   Lot: [id number]   NDC: [address]-6

## 2019-12-24 NOTE — Patient Instructions (Signed)
-   then continue current dosage of 2.5 mg daily except 5 mg on MONDAYS, WEDNESDAYS & FRIDAYS. - Please be consistent w/ your green leafy vegetables.  - Recheck in 6 weeks.

## 2020-02-04 ENCOUNTER — Ambulatory Visit (INDEPENDENT_AMBULATORY_CARE_PROVIDER_SITE_OTHER): Payer: Medicare Other

## 2020-02-04 ENCOUNTER — Other Ambulatory Visit: Payer: Self-pay

## 2020-02-04 ENCOUNTER — Other Ambulatory Visit: Payer: Self-pay | Admitting: Cardiovascular Disease

## 2020-02-04 DIAGNOSIS — Z5181 Encounter for therapeutic drug level monitoring: Secondary | ICD-10-CM | POA: Diagnosis not present

## 2020-02-04 DIAGNOSIS — I82409 Acute embolism and thrombosis of unspecified deep veins of unspecified lower extremity: Secondary | ICD-10-CM

## 2020-02-04 LAB — POCT INR: INR: 2.7 (ref 2.0–3.0)

## 2020-02-04 NOTE — Telephone Encounter (Signed)
Refill request

## 2020-02-04 NOTE — Patient Instructions (Signed)
-   then continue current dosage of 2.5 mg daily except 5 mg on MONDAYS, WEDNESDAYS & FRIDAYS. - Please be consistent w/ your green leafy vegetables.  - Recheck in 6 weeks.

## 2020-03-17 ENCOUNTER — Ambulatory Visit (INDEPENDENT_AMBULATORY_CARE_PROVIDER_SITE_OTHER): Payer: Medicare Other

## 2020-03-17 ENCOUNTER — Other Ambulatory Visit: Payer: Self-pay

## 2020-03-17 ENCOUNTER — Ambulatory Visit (INDEPENDENT_AMBULATORY_CARE_PROVIDER_SITE_OTHER): Payer: Medicare Other | Admitting: *Deleted

## 2020-03-17 DIAGNOSIS — I442 Atrioventricular block, complete: Secondary | ICD-10-CM

## 2020-03-17 DIAGNOSIS — I82409 Acute embolism and thrombosis of unspecified deep veins of unspecified lower extremity: Secondary | ICD-10-CM

## 2020-03-17 DIAGNOSIS — Z5181 Encounter for therapeutic drug level monitoring: Secondary | ICD-10-CM | POA: Diagnosis not present

## 2020-03-17 LAB — POCT INR: INR: 3 (ref 2.0–3.0)

## 2020-03-17 NOTE — Patient Instructions (Signed)
-   continue current dosage of 2.5 mg daily except 5 mg on MONDAYS, WEDNESDAYS & FRIDAYS. - Please be consistent w/ your green leafy vegetables.  - Recheck in 6 weeks.

## 2020-03-18 ENCOUNTER — Telehealth: Payer: Self-pay | Admitting: Cardiovascular Disease

## 2020-03-18 ENCOUNTER — Telehealth: Payer: Self-pay

## 2020-03-18 NOTE — Telephone Encounter (Signed)
New Message  i have Hiawatha Golla on the line returning your phone call

## 2020-03-18 NOTE — Telephone Encounter (Signed)
Spoke with patient to remind of missed remote transmission 

## 2020-03-22 LAB — CUP PACEART REMOTE DEVICE CHECK
Battery Remaining Longevity: 130 mo
Battery Remaining Percentage: 95.5 %
Battery Voltage: 3.01 V
Brady Statistic AP VP Percent: 10 %
Brady Statistic AP VS Percent: 1 %
Brady Statistic AS VP Percent: 90 %
Brady Statistic AS VS Percent: 1 %
Brady Statistic RA Percent Paced: 9.6 %
Brady Statistic RV Percent Paced: 99 %
Date Time Interrogation Session: 20210529031746
Implantable Lead Implant Date: 20180825
Implantable Lead Implant Date: 20180825
Implantable Lead Location: 753859
Implantable Lead Location: 753860
Implantable Pulse Generator Implant Date: 20180825
Lead Channel Impedance Value: 510 Ohm
Lead Channel Impedance Value: 650 Ohm
Lead Channel Pacing Threshold Amplitude: 0.625 V
Lead Channel Pacing Threshold Amplitude: 1.625 V
Lead Channel Pacing Threshold Pulse Width: 0.5 ms
Lead Channel Pacing Threshold Pulse Width: 0.5 ms
Lead Channel Sensing Intrinsic Amplitude: 10.5 mV
Lead Channel Sensing Intrinsic Amplitude: 4.3 mV
Lead Channel Setting Pacing Amplitude: 0.875
Lead Channel Setting Pacing Amplitude: 3.125
Lead Channel Setting Pacing Pulse Width: 0.5 ms
Lead Channel Setting Sensing Sensitivity: 4 mV
Pulse Gen Model: 2272
Pulse Gen Serial Number: 8939923

## 2020-03-23 NOTE — Progress Notes (Signed)
Remote pacemaker transmission.   

## 2020-03-27 ENCOUNTER — Other Ambulatory Visit: Payer: Self-pay | Admitting: Internal Medicine

## 2020-03-29 NOTE — Patient Instructions (Addendum)
°  Blood work was ordered.   ° ° °Medications reviewed and updated.  Changes include :   none ° ° ° °Please followup in 6 months ° ° °

## 2020-03-29 NOTE — Progress Notes (Signed)
Subjective:    [name], male    DOB: 1934-03-13, 86 y.o.   MRN: [id number]  HPI The patient is here for follow up of their chronic medical problems, including htn, diabetes, hyperlipidemia, osteoarthritis  He is taking all of his medications as prescribed.   He is eating healthy.  He finds himself eating less.   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      His back, hips, knees have been hurting more.  He takes a pain pill after yard work and sometimes around 3 am.    Medications and allergies reviewed with patient and updated if appropriate.  Patient Active Problem List   Diagnosis Date Noted  . Pacemaker 07/21/2019  . DVT (deep venous thrombosis) (HCC) 08/28/2017  . Complete heart block (HCC) 06/15/2017  . Chronic fatigue 01/20/2017  . Wears hearing aid 09/25/2016  . Degeneration of lumbar or lumbosacral intervertebral disc 03/23/2015  . Orthostatic hypotension 03/27/2013  . Vitamin D deficiency 11/03/2010  . DIVERTICULOSIS, COLON 11/03/2010  . Osteoarthritis (arthritis due to wear and tear of joints) 11/03/2010  . Hyperlipidemia 12/01/2009  . HYPERTENSION, BENIGN 12/01/2009  . CAD, ARTERY BYPASS GRAFT 12/01/2009  . Type 2 diabetes mellitus with neurological manifestations, controlled (HCC)     Current Outpatient Medications on File Prior to Visit  Medication Sig Dispense Refill  . aspirin EC 81 MG tablet Take 81 mg by mouth daily.    Marland Kitchen atorvastatin (LIPITOR) 20 MG tablet TAKE 1 TABLET DAILY AT 6 P.M. (NEED OFFICE VISIT FOR MORE REFILLS) 90 tablet 1  . carvedilol (COREG) 6.25 MG tablet Take 1 tablet (6.25 mg total) by mouth 2 (two) times daily with a meal. 180 tablet 3  . celecoxib (CELEBREX) 200 MG capsule TAKE 1 CAPSULE TWICE DAILY 180 capsule 1  . cholecalciferol (VITAMIN D) 1000 UNITS tablet Take 1,000 Units by mouth daily.    Marland Kitchen HYDROcodone-acetaminophen (NORCO/VICODIN) 5-325 MG per tablet Take 1 tablet by mouth every 6 (six) hours as  needed for moderate pain.     . metFORMIN (GLUCOPHAGE-XR) 500 MG 24 hr tablet TAKE 1 TABLET TWICE A DAY 180 tablet 3  . Multiple Vitamin (MULTIVITAMIN WITH MINERALS) TABS tablet Take 1 tablet by mouth daily.    . niacin (NIASPAN) 1000 MG CR tablet Take 1 tablet (1,000 mg total) by mouth at bedtime. 90 tablet 1  . sildenafil (REVATIO) 20 MG tablet Take 1 tablet (20 mg total) by mouth 3 (three) times daily as needed. 90 tablet 3  . warfarin (JANTOVEN) 5 MG tablet TAKE AS DIRECTED BY THE    COUMADIN CLINIC 75 tablet 0   No current facility-administered medications on file prior to visit.    Past Medical History:  Diagnosis Date  . Colonic polyp 2011 last colo   colo q 14yr - [facility]   . Coronary artery disease 02/2009   severe left main and three-vessel, CABG  . DEGENERATIVE JOINT DISEASE   . Diverticulosis of colon   . DIVERTICULOSIS, COLON   . HYPERLIPIDEMIA-MIXED   . HYPERTENSION, BENIGN   . LBP (low back pain)    s/p decompression 8/14; ESI - [place]; RFA x 3 early 2015  . Pacemaker    a. 05/2017: St. Jude (serial number [id number]) pacemaker  . Symptomatic bradycardia    a. s/p St. Jude (serial number [id number]) pacemaker 06/16/17  . Type II or unspecified type diabetes mellitus without mention of complication, not stated as uncontrolled   .  VITAMIN D DEFICIENCY     Past Surgical History:  Procedure Laterality Date  . CARPAL TUNNEL RELEASE Right   . CATARACT EXTRACTION, BILATERAL     R 07/07/13, L 07/21/13  . CORONARY ARTERY BYPASS GRAFT  03/17/09   x 4  . INGUINAL HERNIA REPAIR  1980  . KNEE ARTHROSCOPY  2007   right  . LUMBAR EPIDURAL INJECTION  2013   Has as needed.  . LUMBAR LAMINECTOMY/DECOMPRESSION MICRODISCECTOMY N/A 06/19/2013   Procedure: LUMBAR LAMINECTOMY/DECOMPRESSION MICRODISCECTOMY;  Surgeon: Sinclair Ship, MD;  Location: [facility];  Service: Orthopedics;  Laterality: N/A;  Lumbar 4-5 decompression  . PACEMAKER IMPLANT N/A 06/16/2017   Procedure: Pacemaker Implant;   Surgeon: Evans Lance, MD;  Location: [facility];  Service: Cardiovascular;  Laterality: N/A;  . TRIGGER FINGER RELEASE Right     Social History   Socioeconomic History  . Marital status: Married    Spouse name: Not on file  . Number of children: Not on file  . Years of education: Not on file  . Highest education level: Not on file  Occupational History  . Not on file  Tobacco Use  . Smoking status: Never Smoker  . Smokeless tobacco: Never Used  . Tobacco comment: Married, 3 grown children. Retired 04/2002 from [employer] work  Substance and Sexual Activity  . Alcohol use: Yes    Comment: RARE  . Drug use: No  . Sexual activity: Not on file  Other Topics Concern  . Not on file  Social History Narrative  . Not on file   Social Determinants of Health   Financial Resource Strain:   . Difficulty of Paying Living Expenses:   Food Insecurity:   . Worried About Charity fundraiser in the Last Year:   . Arboriculturist in the Last Year:   Transportation Needs:   . Film/video editor (Medical):   Marland Kitchen Lack of Transportation (Non-Medical):   Physical Activity:   . Days of Exercise per Week:   . Minutes of Exercise per Session:   Stress:   . Feeling of Stress :   Social Connections:   . Frequency of Communication with Friends and Family:   . Frequency of Social Gatherings with Friends and Family:   . Attends Religious Services:   . Active Member of Clubs or Organizations:   . Attends Archivist Meetings:   Marland Kitchen Marital Status:     Family History  Problem Relation Age of Onset  . Heart failure Mother        died age 91  . Heart failure Father        died age 14  . Diabetes Other   . Heart disease Neg Hx     Review of Systems  Constitutional: Negative for chills and fever.  Respiratory: Positive for shortness of breath (with two flights of stairs). Negative for cough and wheezing.   Cardiovascular: Positive for leg swelling.  Negative for chest pain and palpitations.  Musculoskeletal: Positive for arthralgias and back pain.  Neurological: Negative for dizziness, light-headedness and headaches.       Objective:   Vitals:   03/30/20 0751  BP: 112/72  Pulse: 86  Temp: 98.3 F (36.8 C)  SpO2: 95%   BP Readings from Last 3 Encounters:  03/30/20 112/72  10/01/19 122/68  08/26/19 120/60   Wt Readings from Last 3 Encounters:  03/30/20 184 lb 3.2 oz (83.6 kg)  10/01/19 181  lb (82.1 kg)  08/26/19 183 lb (83 kg)   Body mass index is 28.01 kg/m.   Physical Exam    Constitutional: Appears well-developed and well-nourished. No distress.  HENT:  Head: Normocephalic and atraumatic.  Neck: Neck supple. No tracheal deviation present. No thyromegaly present.  No cervical lymphadenopathy Cardiovascular: Normal rate, regular rhythm and normal heart sounds.   2/6 sys murmur heard. No carotid bruit .  Trace LLE edema, no RLE edema Pulmonary/Chest: Effort normal and breath sounds normal. No respiratory distress. No has no wheezes. No rales.  Skin: Skin is warm and dry. Not diaphoretic.  Psychiatric: Normal mood and affect. Behavior is normal.      Assessment & Plan:    See Problem List for Assessment and Plan of chronic medical problems.    This visit occurred during the SARS-CoV-2 public health emergency.  Safety protocols were in place, including screening questions prior to the visit, additional usage of staff PPE, and extensive cleaning of exam room while observing appropriate contact time as indicated for disinfecting solutions.

## 2020-03-30 ENCOUNTER — Encounter: Payer: Self-pay | Admitting: Internal Medicine

## 2020-03-30 ENCOUNTER — Ambulatory Visit (INDEPENDENT_AMBULATORY_CARE_PROVIDER_SITE_OTHER): Payer: Medicare Other | Admitting: Internal Medicine

## 2020-03-30 ENCOUNTER — Other Ambulatory Visit: Payer: Self-pay

## 2020-03-30 VITALS — BP 112/72 | HR 86 | Temp 98.3°F | Ht 68.0 in | Wt 184.2 lb

## 2020-03-30 DIAGNOSIS — E782 Mixed hyperlipidemia: Secondary | ICD-10-CM | POA: Diagnosis not present

## 2020-03-30 DIAGNOSIS — E1149 Type 2 diabetes mellitus with other diabetic neurological complication: Secondary | ICD-10-CM | POA: Diagnosis not present

## 2020-03-30 DIAGNOSIS — I10 Essential (primary) hypertension: Secondary | ICD-10-CM | POA: Diagnosis not present

## 2020-03-30 DIAGNOSIS — M159 Polyosteoarthritis, unspecified: Secondary | ICD-10-CM | POA: Diagnosis not present

## 2020-03-30 DIAGNOSIS — M5137 Other intervertebral disc degeneration, lumbosacral region: Secondary | ICD-10-CM

## 2020-03-30 LAB — COMPREHENSIVE METABOLIC PANEL
ALT: 22 U/L (ref 0–53)
AST: 22 U/L (ref 0–37)
Albumin: 4.1 g/dL (ref 3.5–5.2)
Alkaline Phosphatase: 52 U/L (ref 39–117)
BUN: 31 mg/dL — ABNORMAL HIGH (ref 6–23)
CO2: 25 mEq/L (ref 19–32)
Calcium: 9.2 mg/dL (ref 8.4–10.5)
Chloride: 106 mEq/L (ref 96–112)
Creatinine, Ser: 1.42 mg/dL (ref 0.40–1.50)
GFR: 47.52 mL/min — ABNORMAL LOW (ref >60.00)
Glucose, Bld: 225 mg/dL — ABNORMAL HIGH (ref 70–99)
Potassium: 4.2 mEq/L (ref 3.5–5.1)
Sodium: 138 mEq/L (ref 135–145)
Total Bilirubin: 0.7 mg/dL (ref 0.2–1.2)
Total Protein: 6.5 g/dL (ref 6.0–8.3)

## 2020-03-30 LAB — HEMOGLOBIN A1C: Hgb A1c MFr Bld: 7.2 % — ABNORMAL HIGH (ref 4.6–6.5)

## 2020-03-30 LAB — LIPID PANEL
Cholesterol: 136 mg/dL (ref 0–200)
HDL: 45.7 mg/dL (ref >39.00)
LDL Cholesterol: 63 mg/dL (ref 0–99)
NonHDL: 90.41
Total CHOL/HDL Ratio: 3
Triglycerides: 136 mg/dL (ref 0.0–149.0)
VLDL: 27.2 mg/dL (ref 0.0–40.0)

## 2020-03-30 NOTE — Assessment & Plan Note (Signed)
Chronic BP well controlled Current regimen effective and well tolerated Continue current medications at current doses cmp  

## 2020-03-30 NOTE — Assessment & Plan Note (Signed)
Chronic Sees ortho - [facility] Gets injections Q 3 months Taking hydrocodone

## 2020-03-30 NOTE — Assessment & Plan Note (Signed)
Lab Results  Component Value Date   HGBA1C 6.6 (H) 10/01/2019   Chronic Check a1c Low sugar / carb diet Continue high activity

## 2020-03-30 NOTE — Assessment & Plan Note (Signed)
Chronic Taking celebrex twice daily continue

## 2020-03-30 NOTE — Assessment & Plan Note (Signed)
Chronic Check lipid panel  Continue daily statin Regular exercise and healthy diet encouraged  

## 2020-04-28 ENCOUNTER — Other Ambulatory Visit: Payer: Self-pay

## 2020-04-28 ENCOUNTER — Ambulatory Visit (INDEPENDENT_AMBULATORY_CARE_PROVIDER_SITE_OTHER): Payer: Medicare Other

## 2020-04-28 DIAGNOSIS — Z5181 Encounter for therapeutic drug level monitoring: Secondary | ICD-10-CM

## 2020-04-28 DIAGNOSIS — I82409 Acute embolism and thrombosis of unspecified deep veins of unspecified lower extremity: Secondary | ICD-10-CM | POA: Diagnosis not present

## 2020-04-28 LAB — POCT INR: INR: 3.7 — AB (ref 2.0–3.0)

## 2020-04-28 NOTE — Patient Instructions (Addendum)
-   skip warfarin tonight - then (since you are decreasing your greens intake) START NEW DOSAGE of 2.5 mg daily except 5 mg on MONDAYS & FRIDAYS. - Please be consistent w/ your green leafy vegetables  - pick a day (or days)  to have your greens and have them every week - Recheck in 3 weeks.

## 2020-05-05 ENCOUNTER — Other Ambulatory Visit: Payer: Self-pay | Admitting: Internal Medicine

## 2020-05-19 ENCOUNTER — Ambulatory Visit (INDEPENDENT_AMBULATORY_CARE_PROVIDER_SITE_OTHER): Payer: Medicare Other

## 2020-05-19 ENCOUNTER — Other Ambulatory Visit: Payer: Self-pay

## 2020-05-19 DIAGNOSIS — Z5181 Encounter for therapeutic drug level monitoring: Secondary | ICD-10-CM | POA: Diagnosis not present

## 2020-05-19 DIAGNOSIS — I82409 Acute embolism and thrombosis of unspecified deep veins of unspecified lower extremity: Secondary | ICD-10-CM | POA: Diagnosis not present

## 2020-05-19 LAB — POCT INR: INR: 2.5 (ref 2.0–3.0)

## 2020-05-19 NOTE — Patient Instructions (Signed)
-   continue warfarin dosage of 2.5 mg daily except 5 mg on MONDAYS & FRIDAYS. - Please be consistent w/ your green leafy vegetables  - pick a day (or days)  to have your greens and have them every week - Recheck in 4 weeks.

## 2020-05-27 ENCOUNTER — Other Ambulatory Visit: Payer: Self-pay | Admitting: Cardiovascular Disease

## 2020-05-28 NOTE — Telephone Encounter (Signed)
Refill request

## 2020-06-15 ENCOUNTER — Other Ambulatory Visit: Payer: Self-pay | Admitting: Physical Medicine and Rehabilitation

## 2020-06-15 ENCOUNTER — Ambulatory Visit
Admission: RE | Admit: 2020-06-15 | Discharge: 2020-06-15 | Disposition: A | Discharge status: Home / Self Care | Payer: Medicare Other | Source: Ambulatory Visit | Attending: Physical Medicine and Rehabilitation | Admitting: Physical Medicine and Rehabilitation

## 2020-06-15 DIAGNOSIS — M545 Low back pain, unspecified: Secondary | ICD-10-CM

## 2020-06-16 ENCOUNTER — Other Ambulatory Visit: Payer: Self-pay

## 2020-06-16 ENCOUNTER — Ambulatory Visit (INDEPENDENT_AMBULATORY_CARE_PROVIDER_SITE_OTHER): Payer: Medicare Other | Admitting: *Deleted

## 2020-06-16 ENCOUNTER — Ambulatory Visit (INDEPENDENT_AMBULATORY_CARE_PROVIDER_SITE_OTHER): Payer: Medicare Other

## 2020-06-16 DIAGNOSIS — I82409 Acute embolism and thrombosis of unspecified deep veins of unspecified lower extremity: Secondary | ICD-10-CM

## 2020-06-16 DIAGNOSIS — Z5181 Encounter for therapeutic drug level monitoring: Secondary | ICD-10-CM | POA: Diagnosis not present

## 2020-06-16 DIAGNOSIS — R001 Bradycardia, unspecified: Secondary | ICD-10-CM | POA: Diagnosis not present

## 2020-06-16 LAB — POCT INR: INR: 2.3 (ref 2.0–3.0)

## 2020-06-16 NOTE — Patient Instructions (Signed)
-   continue warfarin dosage of 2.5 mg daily except 5 mg on MONDAYS & FRIDAYS. - Please be consistent w/ your green leafy vegetables  - pick a day (or days)  to have your greens and have them every week - Recheck in 5 weeks.

## 2020-06-18 LAB — CUP PACEART REMOTE DEVICE CHECK
Date Time Interrogation Session: 20210827062816
Implantable Lead Implant Date: 20180825
Implantable Lead Implant Date: 20180825
Implantable Lead Location: 753859
Implantable Lead Location: 753860
Implantable Pulse Generator Implant Date: 20180825
Pulse Gen Model: 2272
Pulse Gen Serial Number: 8939923

## 2020-06-22 NOTE — Progress Notes (Signed)
Remote pacemaker transmission.   

## 2020-07-02 ENCOUNTER — Other Ambulatory Visit: Payer: Self-pay | Admitting: Internal Medicine

## 2020-07-21 ENCOUNTER — Other Ambulatory Visit: Payer: Self-pay

## 2020-07-21 ENCOUNTER — Ambulatory Visit (INDEPENDENT_AMBULATORY_CARE_PROVIDER_SITE_OTHER): Payer: Medicare Other

## 2020-07-21 DIAGNOSIS — Z5181 Encounter for therapeutic drug level monitoring: Secondary | ICD-10-CM | POA: Diagnosis not present

## 2020-07-21 DIAGNOSIS — I82409 Acute embolism and thrombosis of unspecified deep veins of unspecified lower extremity: Secondary | ICD-10-CM

## 2020-07-21 LAB — POCT INR: INR: 3 (ref 2.0–3.0)

## 2020-07-21 NOTE — Patient Instructions (Signed)
-   continue warfarin dosage of 2.5 mg daily except 5 mg on MONDAYS & FRIDAYS. - Please be consistent w/ your green leafy vegetables  - pick a day (or days)  to have your greens and have them every week - Recheck in 6 weeks 

## 2020-07-23 ENCOUNTER — Ambulatory Visit (INDEPENDENT_AMBULATORY_CARE_PROVIDER_SITE_OTHER): Payer: Medicare Other | Admitting: Internal Medicine

## 2020-07-23 ENCOUNTER — Other Ambulatory Visit: Payer: Self-pay

## 2020-07-23 ENCOUNTER — Encounter: Payer: Self-pay | Admitting: Internal Medicine

## 2020-07-23 VITALS — BP 138/80 | HR 71 | Ht 68.0 in | Wt 178.4 lb

## 2020-07-23 DIAGNOSIS — I442 Atrioventricular block, complete: Secondary | ICD-10-CM | POA: Diagnosis not present

## 2020-07-23 DIAGNOSIS — I10 Essential (primary) hypertension: Secondary | ICD-10-CM

## 2020-07-23 DIAGNOSIS — Z950 Presence of cardiac pacemaker: Secondary | ICD-10-CM

## 2020-07-23 NOTE — Patient Instructions (Signed)

## 2020-07-23 NOTE — Progress Notes (Signed)
HPI Andrew [name] today for ongoing evaluation and management of complete heart block status post pacemaker insertion. He is a very pleasant 86 year old man with a history of coronary artery disease status post bypass surgery, hypertension, diabetes, and dyslipidemia who is s/p PPM insertion for CHB.His symptoms have resolved. He denies chest pain or sob.  Allergies  Allergen Reactions   Penicillins Other (See Comments)    Taste buds     Current Outpatient Medications  Medication Sig Dispense Refill   aspirin EC 81 MG tablet Take 81 mg by mouth daily.     atorvastatin (LIPITOR) 20 MG tablet TAKE 1 TABLET DAILY AT 6   P.M. 90 tablet 1   carvedilol (COREG) 6.25 MG tablet Take 1 tablet (6.25 mg total) by mouth 2 (two) times daily with a meal. 180 tablet 3   celecoxib (CELEBREX) 200 MG capsule TAKE 1 CAPSULE TWICE DAILY 180 capsule 1   cholecalciferol (VITAMIN D) 1000 UNITS tablet Take 1,000 Units by mouth daily.     HYDROcodone-acetaminophen (NORCO/VICODIN) 5-325 MG per tablet Take 1 tablet by mouth every 6 (six) hours as needed for moderate pain.      metFORMIN (GLUCOPHAGE-XR) 500 MG 24 hr tablet TAKE 1 TABLET TWICE A DAY 180 tablet 3   Multiple Vitamin (MULTIVITAMIN WITH MINERALS) TABS tablet Take 1 tablet by mouth daily.     niacin (NIASPAN) 1000 MG CR tablet TAKE 1 TABLET AT BEDTIME 90 tablet 3   sildenafil (REVATIO) 20 MG tablet Take 1 tablet (20 mg total) by mouth 3 (three) times daily as needed. 90 tablet 3   warfarin (JANTOVEN) 5 MG tablet TAKE AS DIRECTED BY THE COUMADIN CLINIC 75 tablet 1   No current facility-administered medications for this visit.     Past Medical History:  Diagnosis Date   Colonic polyp 2011 last colo   colo q 48yr - [facility]    Coronary artery disease 02/2009   severe left main and three-vessel, CABG   DEGENERATIVE JOINT DISEASE    Diverticulosis of colon    DIVERTICULOSIS, COLON    HYPERLIPIDEMIA-MIXED    HYPERTENSION,  BENIGN    LBP (low back pain)    s/p decompression 8/14; ESI - [place]; RFA x 3 early 2015   Pacemaker    a. 05/2017: St. Jude (serial number [id number]) pacemaker   Symptomatic bradycardia    a. s/p St. Jude (serial number [id number]) pacemaker 06/16/17   Type II or unspecified type diabetes mellitus without mention of complication, not stated as uncontrolled    VITAMIN D DEFICIENCY     ROS:   All systems reviewed and negative except as noted in the HPI.   Past Surgical History:  Procedure Laterality Date   CARPAL TUNNEL RELEASE Right    CATARACT EXTRACTION, BILATERAL     R 07/07/13, L 07/21/13   CORONARY ARTERY BYPASS GRAFT  03/17/09   x 4   INGUINAL HERNIA REPAIR  1980   KNEE ARTHROSCOPY  2007   right   LUMBAR EPIDURAL INJECTION  2013   Has as needed.   LUMBAR LAMINECTOMY/DECOMPRESSION MICRODISCECTOMY N/A 06/19/2013   Procedure: LUMBAR LAMINECTOMY/DECOMPRESSION MICRODISCECTOMY;  Surgeon: Emilee Hero, MD;  Location: [facility] OR;  Service: Orthopedics;  Laterality: N/A;  Lumbar 4-5 decompression   PACEMAKER IMPLANT N/A 06/16/2017   Procedure: Pacemaker Implant;  Surgeon: Marinus Maw, MD;  Location: [facility] INV[facility];  Service: Cardiovascular;  Laterality: N/A;   TRIGGER FINGER RELEASE Right  Family History  Problem Relation Age of Onset   Heart failure Mother        died age 61   Heart failure Father        died age 36   Diabetes Other    Heart disease Neg Hx      Social History   Socioeconomic History   Marital status: Married    Spouse name: Not on file   Number of children: Not on file   Years of education: Not on file   Highest education level: Not on file  Occupational History   Not on file  Tobacco Use   Smoking status: Never Smoker   Smokeless tobacco: Never Used   Tobacco comment: Married, 3 grown children. 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 used  Substance and Sexual  Activity   Alcohol use: Yes    Comment: RARE   Drug use: No   Sexual activity: Not on file  Other Topics Concern   Not on file  Social History Narrative   Not on file   Social Determinants of Health   Financial Resource Strain:    Difficulty of Paying Living Expenses: Not on file  Food Insecurity:    Worried About Programme researcher, broadcasting/film/video in the Last Year: Not on file   [employer] of Food in the Last Year: Not on file  Transportation Needs:    Lack of Transportation (Medical): Not on file   Lack of Transportation (Non-Medical): Not on file  Physical Activity:    Days of Exercise per Week: Not on file   Minutes of Exercise per Session: Not on file  Stress:    Feeling of Stress : Not on file  Social Connections:    Frequency of Communication with Friends and Family: Not on file   Frequency of Social Gatherings with Friends and Family: Not on file   Attends Religious Services: Not on file   Active Member of Clubs or Organizations: Not on file   Attends Banker Meetings: Not on file   Marital Status: Not on file  Intimate Partner Violence:    Fear of Current or Ex-Partner: Not on file   Emotionally Abused: Not on file   Physically Abused: Not on file   Sexually Abused: Not on file     BP 138/80    Pulse 71    Ht 5\' 8"  (1.727 m)    Wt 178 lb 6.4 oz (80.9 kg)    SpO2 97%    BMI 27.13 kg/m   Physical Exam:  Well appearing NAD HEENT: Unremarkable Neck:  No JVD, no thyromegally Lymphatics:  No adenopathy Back:  No CVA tenderness Lungs:  Clear with no wheezes HEART:  Regular rate rhythm, no murmurs, no rubs, no clicks Abd:  soft, positive bowel sounds, no organomegally, no rebound, no guarding Ext:  2 plus pulses, no edema, no cyanosis, no clubbing Skin:  No rashes no nodules Neuro:  CN II through XII intact, motor grossly intact  EKG - nsr with ventricular pacing  DEVICE  Normal device function.  See PaceArt for details.    Assess/Plan: 1. CHB - he is s/p PPM insertion. He is asymptomatic with no escape today. 2. PPM - his St. Jude DDD PM is working normally today. He will be rechecked in several months. 3. Dyslipidemia - he will continue his statin drugs. No muscle aches. 4. HTN - his SBP is minimally elevated.  He will maintain a low sodium diet.  Andrew Gowda Toniesha Zellner,MD

## 2020-08-25 ENCOUNTER — Encounter: Payer: Self-pay | Admitting: Cardiovascular Disease

## 2020-08-25 ENCOUNTER — Other Ambulatory Visit: Payer: Self-pay

## 2020-08-25 ENCOUNTER — Ambulatory Visit (INDEPENDENT_AMBULATORY_CARE_PROVIDER_SITE_OTHER): Payer: Medicare Other | Admitting: Cardiovascular Disease

## 2020-08-25 VITALS — BP 120/60 | HR 79 | Ht 68.0 in | Wt 176.0 lb

## 2020-08-25 DIAGNOSIS — Z950 Presence of cardiac pacemaker: Secondary | ICD-10-CM

## 2020-08-25 DIAGNOSIS — I442 Atrioventricular block, complete: Secondary | ICD-10-CM | POA: Diagnosis not present

## 2020-08-25 DIAGNOSIS — E1149 Type 2 diabetes mellitus with other diabetic neurological complication: Secondary | ICD-10-CM | POA: Diagnosis not present

## 2020-08-25 DIAGNOSIS — I25708 Atherosclerosis of coronary artery bypass graft(s), unspecified, with other forms of angina pectoris: Secondary | ICD-10-CM | POA: Diagnosis not present

## 2020-08-25 DIAGNOSIS — I10 Essential (primary) hypertension: Secondary | ICD-10-CM

## 2020-08-25 DIAGNOSIS — I82409 Acute embolism and thrombosis of unspecified deep veins of unspecified lower extremity: Secondary | ICD-10-CM

## 2020-08-25 DIAGNOSIS — E782 Mixed hyperlipidemia: Secondary | ICD-10-CM

## 2020-08-25 NOTE — Progress Notes (Signed)
Cardiology Office Note  Date:  08/25/2020   ID:  Andrew [name], DOB 07-26-1934, MRN [id number]  PCP:  Pincus Sanes, MD   Chief Complaint  Patient presents with  . Follow-up    12 month follow up. Meds reviewed by the pt. verbally. Pt. c/o shortness of breath with over exertion.     HPI:  Andrew [name] is a very pleasant 86 year old gentleman with history of  coronary artery disease,  bypass in May 2010, Chronic hip and back pain, can't walk hx of near syncope  hyperlipidemia,  diabetes,  hypertension,  complete heart block 06/15/2017, pacer Prior DVT , on long-term anticoagulation, who presents for follow-up of his coronary artery disease  Last seen in clinic November 2020 Seen by EP October 2021, no acute issues on that visit  Limited by bulging disk",  Goes to [facility], Dr. Ginger Organ PT, did not help much No exercise Interested in possible surgical treatment of his back given the limitations  "wiped out" with stairs, Can walk 250 feet ok, mail, come back, then sits down secondary to back Spends time splitting wood  Reports his weight is stable, 165 at home  Lab work reviewed with him Previous total cholesterol 136, LDL 63 HAB1C 6.8 to 7.2 CR 1.42, BUN 31  EKG personally reviewed by myself on todays visit Paced rhythm rate 79 bpm  Previous hospital records reviewed hospitalization for complete heart block 06/15/2017 Bradycardic heart rate in the 20 to 30s St. Jude pacemaker placed June 11 2017, by Dr. Ladona Ridgel  Stress test: 12/27/2016: no ischemia Results reviewed with him in detail  Event monitor 12/21/2016:  Results reviewed with him in detail Normal sinus rhythm Min HR of 43 bpm, max HR of 117 bpm, and avg HR of 72 bpm.  Rare APCs and PVCs likely causing symptoms No other significnat arrhythmia  History of a pinched nerve, back surgery 06/19/2013. He continues to have back pain, status post radiofrequency treatments x3 through [facility]  orthopedics.    PMH:   has a past medical history of Colonic polyp (2011 last colo), Coronary artery disease (02/2009), DEGENERATIVE JOINT DISEASE, Diverticulosis of colon, DIVERTICULOSIS, COLON, HYPERLIPIDEMIA-MIXED, HYPERTENSION, BENIGN, LBP (low back pain), Pacemaker, Symptomatic bradycardia, Type II or unspecified type diabetes mellitus without mention of complication, not stated as uncontrolled, and VITAMIN D DEFICIENCY.  PSH:    Past Surgical History:  Procedure Laterality Date  . CARPAL TUNNEL RELEASE Right   . CATARACT EXTRACTION, BILATERAL     R 07/07/13, L 07/21/13  . CORONARY ARTERY BYPASS GRAFT  03/17/09   x 4  . INGUINAL HERNIA REPAIR  1980  . KNEE ARTHROSCOPY  2007   right  . LUMBAR EPIDURAL INJECTION  2013   Has as needed.  . LUMBAR LAMINECTOMY/DECOMPRESSION MICRODISCECTOMY N/A 06/19/2013   Procedure: LUMBAR LAMINECTOMY/DECOMPRESSION MICRODISCECTOMY;  Surgeon: Emilee Hero, MD;  Location: [facility] OR;  Service: Orthopedics;  Laterality: N/A;  Lumbar 4-5 decompression  . PACEMAKER IMPLANT N/A 06/16/2017   Procedure: Pacemaker Implant;  Surgeon: Marinus Maw, MD;  Location: [facility] INVASIVE CV LAB;  Service: Cardiovascular;  Laterality: N/A;  . TRIGGER FINGER RELEASE Right     Current Outpatient Medications  Medication Sig Dispense Refill  . aspirin EC 81 MG tablet Take 81 mg by mouth daily.    Marland Kitchen atorvastatin (LIPITOR) 20 MG tablet TAKE 1 TABLET DAILY AT 6   P.M. 90 tablet 1  . carvedilol (COREG) 6.25 MG tablet Take 1 tablet (6.25 mg total)  by mouth 2 (two) times daily with a meal. 180 tablet 3  . celecoxib (CELEBREX) 200 MG capsule TAKE 1 CAPSULE TWICE DAILY 180 capsule 1  . cholecalciferol (VITAMIN D) 1000 UNITS tablet Take 1,000 Units by mouth daily.    Marland Kitchen HYDROcodone-acetaminophen (NORCO/VICODIN) 5-325 MG per tablet Take 1 tablet by mouth every 6 (six) hours as needed for moderate pain.     . metFORMIN (GLUCOPHAGE-XR) 500 MG 24 hr tablet TAKE 1 TABLET TWICE A DAY 180  tablet 3  . Multiple Vitamin (MULTIVITAMIN WITH MINERALS) TABS tablet Take 1 tablet by mouth daily.    . niacin (NIASPAN) 1000 MG CR tablet TAKE 1 TABLET AT BEDTIME 90 tablet 3  . sildenafil (REVATIO) 20 MG tablet Take 1 tablet (20 mg total) by mouth 3 (three) times daily as needed. 90 tablet 3  . warfarin (JANTOVEN) 5 MG tablet TAKE AS DIRECTED BY THE COUMADIN CLINIC 75 tablet 1   No current facility-administered medications for this visit.     Allergies:   Penicillins   Social History:  The patient  reports that he has never smoked. He has never used smokeless tobacco. He reports current alcohol use. He reports that he does not use drugs.   Family History:   family history includes Diabetes in an other family member; Heart failure in his father and mother.    Review of Systems: Review of Systems  Constitutional: Negative.   Respiratory: Negative.   Cardiovascular: Negative.   Gastrointestinal: Negative.   Musculoskeletal: Positive for back pain and joint pain.  Neurological: Negative.   Psychiatric/Behavioral: Negative.   All other systems reviewed and are negative.   PHYSICAL EXAM: VS:  BP 120/60 (BP Location: Left Arm, Patient Position: Sitting, Cuff Size: Normal)   Pulse 79   Ht 5\' 8"  (1.727 m)   Wt 176 lb (79.8 kg)   SpO2 98%   BMI 26.76 kg/m  , BMI Body mass index is 26.76 kg/m. Constitutional:  oriented to person, place, and time. No distress.  HENT:  Head: Grossly normal Eyes:  no discharge. No scleral icterus.  Neck: No JVD, no carotid bruits  Cardiovascular: Regular rate and rhythm, no murmurs appreciated Pulmonary/Chest: Clear to auscultation bilaterally, no wheezes or rails Abdominal: Soft.  no distension.  no tenderness.  Musculoskeletal: Normal range of motion Neurological:  normal muscle tone. Coordination normal. No atrophy Skin: Skin warm and dry Psychiatric: normal affect, pleasant   Recent Labs: 10/01/2019: Hemoglobin 13.2; Platelets 202.0; TSH  1.12 03/30/2020: ALT 22; BUN 31; Creatinine, Ser 1.42; Potassium 4.2; Sodium 138    Lipid Panel Lab Results  Component Value Date   CHOL 136 03/30/2020   HDL 45.70 03/30/2020   LDLCALC 63 03/30/2020   TRIG 136.0 03/30/2020      Wt Readings from Last 3 Encounters:  08/25/20 176 lb (79.8 kg)  07/23/20 178 lb 6.4 oz (80.9 kg)  03/30/20 184 lb 3.2 oz (83.6 kg)     ASSESSMENT AND PLAN:  Pure hypercholesterolemia -  Cholesterol is at goal on the current lipid regimen. No changes to the medications were made.  Weight trending downward Stable  DVT dx with DVT on u/s on 10/2, on warfarin Recurrent episodes We will continue indefinite anticoagulation  Chronic back pain He is interested in possible surgical intervention given his limitations  HYPERTENSION, BENIGN -  Blood pressure is well controlled on today's visit. No changes made to the medications.  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 Ladona Ridgel 2021 Has annual follow-up Functioning well  Arthritis Still on Celebrex   Total encounter time more than 25 minutes  Greater than 50% was spent in counseling and coordination of care with the patient   Signed, Dossie Arbour, M.D., Ph.D. 08/25/2020  [facility] [place], [place] [phone]

## 2020-08-25 NOTE — Patient Instructions (Signed)
Talk with Dr. Lovell Sheehan about the back [address], [place], [place] [address] [phone]   Medication Instructions:  No changes  If you need a refill on your cardiac medications before your next appointment, please call your pharmacy.    Lab work: No new labs needed   If you have labs (blood work) drawn today and your tests are completely normal, you will receive your results only by: Marland Kitchen MyChart Message (if you have MyChart) OR . A paper copy in the mail If you have any lab test that is abnormal or we need to change your treatment, we will call you to review the results.   Testing/Procedures: No new testing needed   Follow-Up: At [facility], you and your health needs are our priority.  As part of our continuing mission to provide you with exceptional heart care, we have created designated Provider Care Teams.  These Care Teams include your primary Cardiologist (physician) and Advanced Practice Providers (APPs -  Physician Assistants and Nurse Practitioners) who all work together to provide you with the care you need, when you need it.  . You will need a follow up appointment in 12 months  . Providers on your designated Care Team:   . Nicolasa Ducking, NP . Eula Listen, PA-C . Marisue Ivan, PA-C  Any Other Special Instructions Will Be Listed Below (If Applicable).  COVID-19 Vaccine Information can be found at: [email or web address] For questions related to vaccine distribution or appointments, please email [email or web address] or call [phone].

## 2020-09-01 ENCOUNTER — Ambulatory Visit (INDEPENDENT_AMBULATORY_CARE_PROVIDER_SITE_OTHER): Payer: Medicare Other

## 2020-09-01 ENCOUNTER — Other Ambulatory Visit: Payer: Self-pay

## 2020-09-01 DIAGNOSIS — I82409 Acute embolism and thrombosis of unspecified deep veins of unspecified lower extremity: Secondary | ICD-10-CM | POA: Diagnosis not present

## 2020-09-01 DIAGNOSIS — Z5181 Encounter for therapeutic drug level monitoring: Secondary | ICD-10-CM | POA: Diagnosis not present

## 2020-09-01 LAB — POCT INR: INR: 1.8 — AB (ref 2.0–3.0)

## 2020-09-01 NOTE — Patient Instructions (Signed)
-   take 1 whole tablet (5 mg) today, then  - continue warfarin dosage of 2.5 mg daily except 5 mg on MONDAYS & FRIDAYS. - Please be consistent w/ your green leafy vegetables  - pick a day (or days)  to have your greens and have them every week - Recheck in 4 weeks

## 2020-09-15 ENCOUNTER — Ambulatory Visit (INDEPENDENT_AMBULATORY_CARE_PROVIDER_SITE_OTHER): Payer: Medicare Other

## 2020-09-15 DIAGNOSIS — I442 Atrioventricular block, complete: Secondary | ICD-10-CM | POA: Diagnosis not present

## 2020-09-17 LAB — CUP PACEART REMOTE DEVICE CHECK
Battery Remaining Longevity: 129 mo
Battery Remaining Percentage: 95.5 %
Battery Voltage: 2.99 V
Brady Statistic AP VP Percent: 13 %
Brady Statistic AP VS Percent: 1 %
Brady Statistic AS VP Percent: 87 %
Brady Statistic AS VS Percent: 1 %
Brady Statistic RA Percent Paced: 12 %
Brady Statistic RV Percent Paced: 99 %
Date Time Interrogation Session: 20211125174610
Implantable Lead Implant Date: 20180825
Implantable Lead Implant Date: 20180825
Implantable Lead Location: 753859
Implantable Lead Location: 753860
Implantable Pulse Generator Implant Date: 20180825
Lead Channel Impedance Value: 490 Ohm
Lead Channel Impedance Value: 640 Ohm
Lead Channel Pacing Threshold Amplitude: 0.5 V
Lead Channel Pacing Threshold Amplitude: 0.75 V
Lead Channel Pacing Threshold Pulse Width: 0.5 ms
Lead Channel Pacing Threshold Pulse Width: 0.5 ms
Lead Channel Sensing Intrinsic Amplitude: 10.5 mV
Lead Channel Sensing Intrinsic Amplitude: 4.2 mV
Lead Channel Setting Pacing Amplitude: 0.75 V
Lead Channel Setting Pacing Amplitude: 2 V
Lead Channel Setting Pacing Pulse Width: 0.5 ms
Lead Channel Setting Sensing Sensitivity: 4 mV
Pulse Gen Model: 2272
Pulse Gen Serial Number: 8939923

## 2020-09-23 NOTE — Progress Notes (Signed)
Remote pacemaker transmission.   

## 2020-09-28 NOTE — Patient Instructions (Addendum)
    Blood work was ordered.      Medications changes include :   none     Please followup in 6 months  

## 2020-09-28 NOTE — Progress Notes (Signed)
Subjective:    [name], male    DOB: Jul 21, 1934, 86 y.o.   MRN: [id number]  HPI The patient is here for follow up of their chronic medical problems, including htn, DM, hyperlipidemia, OA, CKD  He is taking all of his medications as prescribed.     He is very active.  He splits wood throughout the year.   Medications and allergies reviewed with patient and updated if appropriate.  Patient Active Problem List   Diagnosis Date Noted  . Pacemaker 07/21/2019  . DVT (deep venous thrombosis) (HCC) 08/28/2017  . Complete heart block (HCC) 06/15/2017  . Chronic fatigue 01/20/2017  . Wears hearing aid 09/25/2016  . Degeneration of lumbar or lumbosacral intervertebral disc 03/23/2015  . Orthostatic hypotension 03/27/2013  . Vitamin D deficiency 11/03/2010  . DIVERTICULOSIS, COLON 11/03/2010  . Osteoarthritis (arthritis due to wear and tear of joints) 11/03/2010  . Hyperlipidemia 12/01/2009  . HYPERTENSION, BENIGN 12/01/2009  . CAD, ARTERY BYPASS GRAFT 12/01/2009  . Type 2 diabetes mellitus with neurological manifestations, controlled (HCC)     Current Outpatient Medications on File Prior to Visit  Medication Sig Dispense Refill  . aspirin EC 81 MG tablet Take 81 mg by mouth daily.    Marland Kitchen atorvastatin (LIPITOR) 20 MG tablet TAKE 1 TABLET DAILY AT 6   P.M. 90 tablet 1  . carvedilol (COREG) 6.25 MG tablet Take 1 tablet (6.25 mg total) by mouth 2 (two) times daily with a meal. 180 tablet 3  . celecoxib (CELEBREX) 200 MG capsule TAKE 1 CAPSULE TWICE DAILY 180 capsule 1  . cholecalciferol (VITAMIN D) 1000 UNITS tablet Take 1,000 Units by mouth daily.    Marland Kitchen HYDROcodone-acetaminophen (NORCO/VICODIN) 5-325 MG per tablet Take 1 tablet by mouth every 6 (six) hours as needed for moderate pain.     . metFORMIN (GLUCOPHAGE-XR) 500 MG 24 hr tablet TAKE 1 TABLET TWICE A DAY 180 tablet 3  . Multiple Vitamin (MULTIVITAMIN WITH MINERALS) TABS tablet Take 1 tablet by mouth daily.    .  niacin (NIASPAN) 1000 MG CR tablet TAKE 1 TABLET AT BEDTIME 90 tablet 3  . sildenafil (REVATIO) 20 MG tablet Take 1 tablet (20 mg total) by mouth 3 (three) times daily as needed. 90 tablet 3  . warfarin (JANTOVEN) 5 MG tablet TAKE AS DIRECTED BY THE COUMADIN CLINIC 75 tablet 1   No current facility-administered medications on file prior to visit.    Past Medical History:  Diagnosis Date  . Colonic polyp 2011 last colo   colo q 64yr - [facility]   . Coronary artery disease 02/2009   severe left main and three-vessel, CABG  . DEGENERATIVE JOINT DISEASE   . Diverticulosis of colon   . DIVERTICULOSIS, COLON   . HYPERLIPIDEMIA-MIXED   . HYPERTENSION, BENIGN   . LBP (low back pain)    s/p decompression 8/14; ESI - [place]; RFA x 3 early 2015  . Pacemaker    a. 05/2017: St. Jude (serial number [id number]) pacemaker  . Symptomatic bradycardia    a. s/p St. Jude (serial number [id number]) pacemaker 06/16/17  . Type II or unspecified type diabetes mellitus without mention of complication, not stated as uncontrolled   . VITAMIN D DEFICIENCY     Past Surgical History:  Procedure Laterality Date  . CARPAL TUNNEL RELEASE Right   . CATARACT EXTRACTION, BILATERAL     R 07/07/13, L 07/21/13  . CORONARY ARTERY BYPASS GRAFT  03/17/09   x 4  .  INGUINAL HERNIA REPAIR  1980  . KNEE ARTHROSCOPY  2007   right  . LUMBAR EPIDURAL INJECTION  2013   Has as needed.  . LUMBAR LAMINECTOMY/DECOMPRESSION MICRODISCECTOMY N/A 06/19/2013   Procedure: LUMBAR LAMINECTOMY/DECOMPRESSION MICRODISCECTOMY;  Surgeon: Emilee Hero, MD;  Location: [facility] OR;  Service: Orthopedics;  Laterality: N/A;  Lumbar 4-5 decompression  . PACEMAKER IMPLANT N/A 06/16/2017   Procedure: Pacemaker Implant;  Surgeon: Marinus Maw, MD;  Location: [facility] INVASIVE CV LAB;  Service: Cardiovascular;  Laterality: N/A;  . TRIGGER FINGER RELEASE Right     Social History   Socioeconomic History  . Marital status: Married    Spouse name: Not on file  .  Number of children: Not on file  . Years of education: Not on file  . Highest education level: Not on file  Occupational History  . Not on file  Tobacco Use  . Smoking status: Never Smoker  . Smokeless tobacco: Never Used  . Tobacco comment: Married, 3 grown children. 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 Vaping Use: Never used  Substance and Sexual Activity  . Alcohol use: Yes    Comment: RARE  . Drug use: No  . Sexual activity: Not on file  Other Topics Concern  . Not on file  Social History Narrative  . Not on file   Social Determinants of Health   Financial Resource Strain:   . Difficulty of Paying Living Expenses: Not on file  Food Insecurity:   . Worried About Programme researcher, broadcasting/film/video in the Last Year: Not on file  . Ran Out of Food in the Last Year: Not on file  Transportation Needs:   . Lack of Transportation (Medical): Not on file  . Lack of Transportation (Non-Medical): Not on file  Physical Activity:   . Days of Exercise per Week: Not on file  . Minutes of Exercise per Session: Not on file  Stress:   . Feeling of Stress : Not on file  Social Connections:   . Frequency of Communication with Friends and Family: Not on file  . Frequency of Social Gatherings with Friends and Family: Not on file  . Attends Religious Services: Not on file  . Active Member of Clubs or Organizations: Not on file  . Attends Banker Meetings: Not on file  . Marital Status: Not on file    Family History  Problem Relation Age of Onset  . Heart failure Mother        died age 38  . Heart failure Father        died age 84  . Diabetes Other   . Heart disease Neg Hx     Review of Systems  Constitutional: Negative for chills and fever.  Respiratory: Negative for cough, shortness of breath and wheezing.   Cardiovascular: Positive for leg swelling (chronic, stable). Negative for chest pain and palpitations.  Neurological: Negative for  light-headedness and headaches.       Objective:   Vitals:   09/29/20 0754  BP: 114/78  Pulse: 73  Temp: 98.1 F (36.7 C)  SpO2: 97%   BP Readings from Last 3 Encounters:  09/29/20 114/78  08/25/20 120/60  07/23/20 138/80   Wt Readings from Last 3 Encounters:  09/29/20 177 lb (80.3 kg)  08/25/20 176 lb (79.8 kg)  07/23/20 178 lb 6.4 oz (80.9 kg)   Body mass index is 26.91 kg/m.   Physical Exam    Constitutional:  Appears well-developed and well-nourished. No distress.  HENT:  Head: Normocephalic and atraumatic.  Neck: Neck supple. No tracheal deviation present. No thyromegaly present.  No cervical lymphadenopathy Cardiovascular: Normal rate, regular rhythm and normal heart sounds.   1/6 systolic murmur heard. No carotid bruit .  No edema Pulmonary/Chest: Effort normal and breath sounds normal. No respiratory distress. No has no wheezes. No rales.  Skin: Skin is warm and dry. Not diaphoretic.  Psychiatric: Normal mood and affect. Behavior is normal.      Assessment & Plan:    See Problem List for Assessment and Plan of chronic medical problems.    This visit occurred during the SARS-CoV-2 public health emergency.  Safety protocols were in place, including screening questions prior to the visit, additional usage of staff PPE, and extensive cleaning of exam room while observing appropriate contact time as indicated for disinfecting solutions.

## 2020-09-29 ENCOUNTER — Ambulatory Visit (INDEPENDENT_AMBULATORY_CARE_PROVIDER_SITE_OTHER): Payer: Medicare Other

## 2020-09-29 ENCOUNTER — Encounter: Payer: Self-pay | Admitting: Internal Medicine

## 2020-09-29 ENCOUNTER — Ambulatory Visit (INDEPENDENT_AMBULATORY_CARE_PROVIDER_SITE_OTHER): Payer: Medicare Other | Admitting: Internal Medicine

## 2020-09-29 ENCOUNTER — Other Ambulatory Visit: Payer: Self-pay

## 2020-09-29 VITALS — BP 114/78 | HR 73 | Temp 98.1°F | Ht 68.0 in | Wt 177.0 lb

## 2020-09-29 DIAGNOSIS — E1149 Type 2 diabetes mellitus with other diabetic neurological complication: Secondary | ICD-10-CM

## 2020-09-29 DIAGNOSIS — M159 Polyosteoarthritis, unspecified: Secondary | ICD-10-CM

## 2020-09-29 DIAGNOSIS — I82409 Acute embolism and thrombosis of unspecified deep veins of unspecified lower extremity: Secondary | ICD-10-CM

## 2020-09-29 DIAGNOSIS — Z5181 Encounter for therapeutic drug level monitoring: Secondary | ICD-10-CM | POA: Diagnosis not present

## 2020-09-29 DIAGNOSIS — I10 Essential (primary) hypertension: Secondary | ICD-10-CM

## 2020-09-29 DIAGNOSIS — E782 Mixed hyperlipidemia: Secondary | ICD-10-CM

## 2020-09-29 LAB — CBC WITH DIFFERENTIAL/PLATELET
Basophils Absolute: 0 10*3/uL (ref 0.0–0.1)
Basophils Relative: 0.3 % (ref 0.0–3.0)
Eosinophils Absolute: 0.1 10*3/uL (ref 0.0–0.7)
Eosinophils Relative: 2 % (ref 0.0–5.0)
HCT: 40.5 % (ref 39.0–52.0)
Hemoglobin: 13.3 g/dL (ref 13.0–17.0)
Lymphocytes Relative: 28.2 % (ref 12.0–46.0)
Lymphs Abs: 1.8 10*3/uL (ref 0.7–4.0)
MCHC: 32.9 g/dL (ref 30.0–36.0)
MCV: 100.1 fl — ABNORMAL HIGH (ref 78.0–100.0)
Monocytes Absolute: 0.4 10*3/uL (ref 0.1–1.0)
Monocytes Relative: 6.3 % (ref 3.0–12.0)
Neutro Abs: 4.1 10*3/uL (ref 1.4–7.7)
Neutrophils Relative %: 63.2 % (ref 43.0–77.0)
Platelets: 213 10*3/uL (ref 150.0–400.0)
RBC: 4.04 Mil/uL — ABNORMAL LOW (ref 4.22–5.81)
RDW: 14.5 % (ref 11.5–15.5)
WBC: 6.5 10*3/uL (ref 4.0–10.5)

## 2020-09-29 LAB — COMPREHENSIVE METABOLIC PANEL
ALT: 29 U/L (ref 0–53)
AST: 19 U/L (ref 0–37)
Albumin: 4.1 g/dL (ref 3.5–5.2)
Alkaline Phosphatase: 57 U/L (ref 39–117)
BUN: 35 mg/dL — ABNORMAL HIGH (ref 6–23)
CO2: 29 mEq/L (ref 19–32)
Calcium: 9.7 mg/dL (ref 8.4–10.5)
Chloride: 102 mEq/L (ref 96–112)
Creatinine, Ser: 1.55 mg/dL — ABNORMAL HIGH (ref 0.40–1.50)
GFR: 40.91 mL/min — ABNORMAL LOW (ref >60.00)
Glucose, Bld: 219 mg/dL — ABNORMAL HIGH (ref 70–99)
Potassium: 4.4 mEq/L (ref 3.5–5.1)
Sodium: 139 mEq/L (ref 135–145)
Total Bilirubin: 0.6 mg/dL (ref 0.2–1.2)
Total Protein: 6.7 g/dL (ref 6.0–8.3)

## 2020-09-29 LAB — LIPID PANEL
Cholesterol: 140 mg/dL (ref 0–200)
HDL: 59.9 mg/dL (ref >39.00)
LDL Cholesterol: 61 mg/dL (ref 0–99)
NonHDL: 80.29
Total CHOL/HDL Ratio: 2
Triglycerides: 98 mg/dL (ref 0.0–149.0)
VLDL: 19.6 mg/dL (ref 0.0–40.0)

## 2020-09-29 LAB — HEMOGLOBIN A1C: Hgb A1c MFr Bld: 6.8 % — ABNORMAL HIGH (ref 4.6–6.5)

## 2020-09-29 LAB — MICROALBUMIN / CREATININE URINE RATIO
Creatinine,U: 93.3 mg/dL
Microalb Creat Ratio: 1.3 mg/g (ref 0.0–30.0)
Microalb, Ur: 1.2 mg/dL (ref 0.0–1.9)

## 2020-09-29 LAB — POCT INR: INR: 2 (ref 2.0–3.0)

## 2020-09-29 NOTE — Patient Instructions (Signed)
-   continue warfarin dosage of 2.5 mg daily except 5 mg on MONDAYS & FRIDAYS. - Please be consistent w/ your green leafy vegetables  - pick a day (or days)  to have your greens and have them every week - Recheck in 5 weeks

## 2020-09-29 NOTE — Assessment & Plan Note (Signed)
Chronic Controlled and improved since starting tumeric Taking celebrex 200 mg twice daily and hydrocodone as needed ( less since starting tumeric)

## 2020-09-29 NOTE — Assessment & Plan Note (Signed)
Chronic Check lipid panel  Continue atorvastatin 20 mg daily Regular exercise and healthy diet encouraged  

## 2020-09-29 NOTE — Assessment & Plan Note (Signed)
Chronic Metformin XR 500 mg BID Check a1c Low sugar / carb diet Stressed regular exercise

## 2020-09-29 NOTE — Assessment & Plan Note (Signed)
Chronic BP well controlled Continue coreg 6.25 mg BID  cmp

## 2020-10-10 ENCOUNTER — Other Ambulatory Visit: Payer: Self-pay | Admitting: Internal Medicine

## 2020-10-10 ENCOUNTER — Other Ambulatory Visit: Payer: Self-pay | Admitting: Cardiovascular Disease

## 2020-11-03 ENCOUNTER — Telehealth: Payer: Self-pay | Admitting: Internal Medicine

## 2020-11-03 ENCOUNTER — Other Ambulatory Visit: Payer: Self-pay

## 2020-11-03 ENCOUNTER — Ambulatory Visit (INDEPENDENT_AMBULATORY_CARE_PROVIDER_SITE_OTHER): Payer: Medicare Other

## 2020-11-03 DIAGNOSIS — Z5181 Encounter for therapeutic drug level monitoring: Secondary | ICD-10-CM

## 2020-11-03 DIAGNOSIS — I82409 Acute embolism and thrombosis of unspecified deep veins of unspecified lower extremity: Secondary | ICD-10-CM | POA: Diagnosis not present

## 2020-11-03 LAB — POCT INR: INR: 2 (ref 2.0–3.0)

## 2020-11-03 NOTE — Telephone Encounter (Signed)
I know we have discussed this in the past, but would he consider taking either a lower dose-100 mg twice daily as needed and only using when absolutely needed or considering taking tramadol.  This may not help quite as much with his arthritis pain, but would be better with his kidney function, which did decrease the last time it was checked and to be better for him since he is on warfarin.

## 2020-11-03 NOTE — Telephone Encounter (Signed)
  celecoxib (CELEBREX) 200 MG capsule [facility] [place], [place] - [address] [facility] Phone:  [phone]  Fax:  [phone]     Last seen- 12.08.21 Next apt- 06.10.22 Requesting a refill

## 2020-11-03 NOTE — Patient Instructions (Signed)
-   START NEW warfarin dosage of 2.5 mg daily except 5 mg on MONDAYS & FRIDAYS. - Please be consistent w/ your green leafy vegetables  - pick a day (or days)  to have your greens and have them every week - Recheck in 5 weeks

## 2020-11-04 MED ORDER — CELECOXIB 100 MG PO CAPS: 100.0000 mg | ORAL_CAPSULE | Freq: Every day | ORAL | 1 refills | Status: DC | PRN

## 2020-11-04 NOTE — Addendum Note (Signed)
Addended by: Pincus Sanes on: 11/04/2020 04:48 PM   Modules accepted: Orders

## 2020-11-17 ENCOUNTER — Telehealth: Payer: Self-pay

## 2020-11-17 NOTE — Telephone Encounter (Signed)
[facility] faxed over a Care Consideration form for pt, they were concern that pt was not having his INR regularly monitor with him being on warfarin. Faxed from back to [facility] letting them know that pt has been having therapeutic monitoring of his INR on a regular basis, this plan of care has already been implemented.

## 2020-12-08 ENCOUNTER — Other Ambulatory Visit: Payer: Self-pay

## 2020-12-08 ENCOUNTER — Ambulatory Visit (INDEPENDENT_AMBULATORY_CARE_PROVIDER_SITE_OTHER): Payer: Medicare Other

## 2020-12-08 DIAGNOSIS — I82409 Acute embolism and thrombosis of unspecified deep veins of unspecified lower extremity: Secondary | ICD-10-CM | POA: Diagnosis not present

## 2020-12-08 DIAGNOSIS — Z5181 Encounter for therapeutic drug level monitoring: Secondary | ICD-10-CM

## 2020-12-08 LAB — POCT INR: INR: 2.3 (ref 2.0–3.0)

## 2020-12-08 NOTE — Patient Instructions (Signed)
-   continue warfarin dosage of 2.5 mg daily except 5 mg on MONDAYS & FRIDAYS. - Please be consistent w/ your green leafy vegetables  - pick a day (or days)  to have your greens and have them every week - Recheck in 6 weeks

## 2020-12-13 ENCOUNTER — Telehealth: Payer: Self-pay | Admitting: Cardiovascular Disease

## 2020-12-13 NOTE — Telephone Encounter (Signed)
Please call regarding Warfarin doseage. Patient needs some guidance.

## 2020-12-13 NOTE — Telephone Encounter (Signed)
Returned a call to the pt and he states the Warfarin maintenance plan differs from the description plan and I was able to confirm he was correct that they do not match. He needed to know which he should follow and advised that since the plan on 11/03/20 was to increase to 1 tab on M/W/F except 1/2 tab all the other days so he should be doing that (the track was changed but not then description). Also, asked what he has been taking and he has been taking the dose new dose of 1 tab on M/W/F and 1 all other days.  Will change the track to line up with current instructions.

## 2020-12-15 ENCOUNTER — Ambulatory Visit (INDEPENDENT_AMBULATORY_CARE_PROVIDER_SITE_OTHER): Payer: Medicare Other

## 2020-12-15 DIAGNOSIS — I442 Atrioventricular block, complete: Secondary | ICD-10-CM | POA: Diagnosis not present

## 2020-12-16 LAB — CUP PACEART REMOTE DEVICE CHECK
Battery Remaining Longevity: 130 mo
Battery Remaining Percentage: 95.5 %
Battery Voltage: 3.01 V
Brady Statistic AP VP Percent: 18 %
Brady Statistic AP VS Percent: 1 %
Brady Statistic AS VP Percent: 82 %
Brady Statistic AS VS Percent: 1 %
Brady Statistic RA Percent Paced: 17 %
Brady Statistic RV Percent Paced: 99 %
Date Time Interrogation Session: 20220223042559
Implantable Lead Implant Date: 20180825
Implantable Lead Implant Date: 20180825
Implantable Lead Location: 753859
Implantable Lead Location: 753860
Implantable Pulse Generator Implant Date: 20180825
Lead Channel Impedance Value: 510 Ohm
Lead Channel Impedance Value: 660 Ohm
Lead Channel Pacing Threshold Amplitude: 0.5 V
Lead Channel Pacing Threshold Amplitude: 0.75 V
Lead Channel Pacing Threshold Pulse Width: 0.5 ms
Lead Channel Pacing Threshold Pulse Width: 0.5 ms
Lead Channel Sensing Intrinsic Amplitude: 12 mV
Lead Channel Sensing Intrinsic Amplitude: 4.5 mV
Lead Channel Setting Pacing Amplitude: 0.75 V
Lead Channel Setting Pacing Amplitude: 2 V
Lead Channel Setting Pacing Pulse Width: 0.5 ms
Lead Channel Setting Sensing Sensitivity: 4 mV
Pulse Gen Model: 2272
Pulse Gen Serial Number: 8939923

## 2020-12-22 ENCOUNTER — Other Ambulatory Visit: Payer: Self-pay | Admitting: Cardiovascular Disease

## 2020-12-23 ENCOUNTER — Telehealth: Payer: Self-pay | Admitting: Cardiovascular Disease

## 2020-12-23 MED ORDER — CARVEDILOL 6.25 MG PO TABS: 6.2500 mg | ORAL_TABLET | Freq: Two times a day (BID) | ORAL | 3 refills | Status: DC

## 2020-12-23 NOTE — Telephone Encounter (Signed)
*  STAT* If patient is at the pharmacy, call can be transferred to refill team.   1. Which medications need to be refilled? (please list name of each medication and dose if known) Carvedilol 6.25 MG 1 tablet twice daily   2. Which pharmacy/location (including street and city if local pharmacy) is medication to be sent to? [facility]   3. Do they need a 30 day or 90 day supply? 90 day

## 2020-12-23 NOTE — Telephone Encounter (Signed)
Refill request

## 2020-12-24 NOTE — Progress Notes (Signed)
Remote pacemaker transmission.   

## 2021-01-19 ENCOUNTER — Ambulatory Visit (INDEPENDENT_AMBULATORY_CARE_PROVIDER_SITE_OTHER): Payer: Medicare Other

## 2021-01-19 ENCOUNTER — Other Ambulatory Visit: Payer: Self-pay

## 2021-01-19 DIAGNOSIS — Z5181 Encounter for therapeutic drug level monitoring: Secondary | ICD-10-CM | POA: Diagnosis not present

## 2021-01-19 DIAGNOSIS — I82409 Acute embolism and thrombosis of unspecified deep veins of unspecified lower extremity: Secondary | ICD-10-CM

## 2021-01-19 LAB — POCT INR: INR: 3.7 — AB (ref 2.0–3.0)

## 2021-01-19 NOTE — Patient Instructions (Signed)
-   skip warfarin tonight, then  - continue warfarin dosage of 2.5 mg daily except 5 mg on MONDAYS, WEDNESDAYS & FRIDAYS. - Please be consistent w/ your green leafy vegetables  - pick a day (or days)  to have your greens and have them every week - Recheck in 4 weeks

## 2021-01-26 ENCOUNTER — Other Ambulatory Visit: Payer: Self-pay | Admitting: Internal Medicine

## 2021-02-16 ENCOUNTER — Ambulatory Visit (INDEPENDENT_AMBULATORY_CARE_PROVIDER_SITE_OTHER): Payer: Medicare Other

## 2021-02-16 ENCOUNTER — Other Ambulatory Visit: Payer: Self-pay

## 2021-02-16 DIAGNOSIS — I82409 Acute embolism and thrombosis of unspecified deep veins of unspecified lower extremity: Secondary | ICD-10-CM

## 2021-02-16 DIAGNOSIS — Z5181 Encounter for therapeutic drug level monitoring: Secondary | ICD-10-CM

## 2021-02-16 LAB — POCT INR: INR: 3.2 — AB (ref 2.0–3.0)

## 2021-02-16 NOTE — Patient Instructions (Signed)
-   take 1/2 tablet warfarin tonight, then  - continue warfarin dosage of 2.5 mg daily except 5 mg on MONDAYS, WEDNESDAYS & FRIDAYS. - Please be consistent w/ your green leafy vegetables  - pick a day (or days)  to have your greens and have them every week - Recheck in 4 weeks

## 2021-03-11 ENCOUNTER — Telehealth: Payer: Self-pay

## 2021-03-11 ENCOUNTER — Other Ambulatory Visit: Payer: Self-pay | Admitting: Physical Medicine and Rehabilitation

## 2021-03-11 DIAGNOSIS — M545 Low back pain, unspecified: Secondary | ICD-10-CM

## 2021-03-11 NOTE — Telephone Encounter (Signed)
Spoke with patient for [place] to screen his medications before getting him scheduled for a lumbar myelogram.  He stated an understanding that he will be here two hours and will need a driver.  He has no medications to hold.

## 2021-03-16 ENCOUNTER — Ambulatory Visit (INDEPENDENT_AMBULATORY_CARE_PROVIDER_SITE_OTHER): Payer: Medicare Other

## 2021-03-16 ENCOUNTER — Other Ambulatory Visit: Payer: Self-pay

## 2021-03-16 DIAGNOSIS — I82409 Acute embolism and thrombosis of unspecified deep veins of unspecified lower extremity: Secondary | ICD-10-CM

## 2021-03-16 DIAGNOSIS — I442 Atrioventricular block, complete: Secondary | ICD-10-CM

## 2021-03-16 DIAGNOSIS — Z5181 Encounter for therapeutic drug level monitoring: Secondary | ICD-10-CM | POA: Diagnosis not present

## 2021-03-16 LAB — POCT INR: INR: 2.5 (ref 2.0–3.0)

## 2021-03-16 NOTE — Patient Instructions (Signed)
-   continue warfarin dosage of 2.5 mg daily except 5 mg on MONDAYS, WEDNESDAYS & FRIDAYS. - Please be consistent w/ your green leafy vegetables  - pick a day (or days)  to have your greens and have them every week - Recheck in 5 weeks

## 2021-03-17 ENCOUNTER — Ambulatory Visit
Admission: RE | Admit: 2021-03-17 | Discharge: 2021-03-17 | Disposition: A | Discharge status: Home / Self Care | Payer: Medicare Other | Source: Ambulatory Visit | Attending: Physical Medicine and Rehabilitation | Admitting: Physical Medicine and Rehabilitation

## 2021-03-17 DIAGNOSIS — M545 Low back pain, unspecified: Secondary | ICD-10-CM

## 2021-03-17 LAB — CUP PACEART REMOTE DEVICE CHECK
Battery Remaining Longevity: 128 mo
Battery Remaining Percentage: 95.5 %
Battery Voltage: 2.99 V
Brady Statistic AP VP Percent: 21 %
Brady Statistic AP VS Percent: 1 %
Brady Statistic AS VP Percent: 78 %
Brady Statistic AS VS Percent: 1 %
Brady Statistic RA Percent Paced: 19 %
Brady Statistic RV Percent Paced: 99 %
Date Time Interrogation Session: 20220525121137
Implantable Lead Implant Date: 20180825
Implantable Lead Implant Date: 20180825
Implantable Lead Location: 753859
Implantable Lead Location: 753860
Implantable Pulse Generator Implant Date: 20180825
Lead Channel Impedance Value: 490 Ohm
Lead Channel Impedance Value: 660 Ohm
Lead Channel Pacing Threshold Amplitude: 0.5 V
Lead Channel Pacing Threshold Amplitude: 0.75 V
Lead Channel Pacing Threshold Pulse Width: 0.5 ms
Lead Channel Pacing Threshold Pulse Width: 0.5 ms
Lead Channel Sensing Intrinsic Amplitude: 12 mV
Lead Channel Sensing Intrinsic Amplitude: 4.7 mV
Lead Channel Setting Pacing Amplitude: 0.75 V
Lead Channel Setting Pacing Amplitude: 2 V
Lead Channel Setting Pacing Pulse Width: 0.5 ms
Lead Channel Setting Sensing Sensitivity: 4 mV
Pulse Gen Model: 2272
Pulse Gen Serial Number: 8939923

## 2021-03-17 MED ORDER — DIAZEPAM 5 MG PO TABS
5.0000 mg | ORAL_TABLET | Freq: Once | ORAL | Status: AC
  Administered 2021-03-17: 5 mg via ORAL

## 2021-03-17 MED ORDER — IOPAMIDOL (ISOVUE-M 200) INJECTION 41%
15.0000 mL | Freq: Once | INTRAMUSCULAR | Status: AC
  Administered 2021-03-17: 15 mL via INTRATHECAL

## 2021-03-17 NOTE — Discharge Instructions (Signed)
Myelogram Discharge Instructions  1. Go home and rest quietly as needed. You may resume normal activities; however, do not exert yourself strongly or do any heavy lifting today and tomorrow.   2. DO NOT drive today.    3. You may resume your normal diet and medications unless otherwise indicated. Drink lots of extra fluids today and tomorrow.   4. The incidence of headache, nausea, or vomiting is about 5% (one in 20 patients).  If you develop a headache, lie flat for 24 hours and drink plenty of fluids until the headache goes away.  Caffeinated beverages may be helpful. If when you get up you still have a headache when standing, go back to bed and force fluids for another 24 hours.   5. If you develop severe nausea and vomiting or a headache that does not go away with the flat bedrest after 48 hours, please call [phone].   6. Call your physician for a follow-up appointment.  The results of your myelogram will be sent directly to your physician by the following day.  7. If you have any questions or if complications develop after you arrive home, please call [phone].  Discharge instructions have been explained to the patient.  The patient, or the person responsible for the patient, fully understands these instructions.   Thank you for visiting our office today.  

## 2021-03-25 ENCOUNTER — Other Ambulatory Visit: Payer: Self-pay | Admitting: Internal Medicine

## 2021-03-31 DIAGNOSIS — N183 Chronic kidney disease, stage 3 unspecified: Secondary | ICD-10-CM | POA: Insufficient documentation

## 2021-03-31 DIAGNOSIS — N1832 Chronic kidney disease, stage 3b: Secondary | ICD-10-CM | POA: Insufficient documentation

## 2021-03-31 NOTE — Patient Instructions (Addendum)
  Blood work was ordered.     Medications changes include :   none  Your prescription(s) have been submitted to your pharmacy. Please take as directed and contact our office if you believe you are having problem(s) with the medication(s).   A referral was ordered for        Someone from their office will call you to schedule an appointment.    Please followup in 6 months  

## 2021-03-31 NOTE — Progress Notes (Signed)
Subjective:    [name], male    DOB: Feb 23, 1934, 87 y.o.   MRN: [id number]  HPI The patient is here for follow up of their chronic medical problems, including htn, DM, hld, OA, CKD  He is taking all of his medications as prescribed.   He is very active.  He has muscle weakness in his legs and difficulty getting up from a chair.    He denies back pain now - he had injections in the back which helped.     Medications and allergies reviewed with patient and updated if appropriate.  Patient Active Problem List   Diagnosis Date Noted   Leg weakness, bilateral 04/01/2021   CKD (chronic kidney disease) stage 3, GFR 30-59 ml/min (HCC) 03/31/2021   Pacemaker 07/21/2019   DVT (deep venous thrombosis) (HCC) 08/28/2017   Complete heart block (HCC) 06/15/2017   Chronic fatigue 01/20/2017   Wears hearing aid 09/25/2016   Degeneration of lumbar or lumbosacral intervertebral disc 03/23/2015   Orthostatic hypotension 03/27/2013   Vitamin D deficiency 11/03/2010   DIVERTICULOSIS, COLON 11/03/2010   Osteoarthritis (arthritis due to wear and tear of joints) 11/03/2010   Hyperlipidemia 12/01/2009   HYPERTENSION, BENIGN 12/01/2009   CAD, ARTERY BYPASS GRAFT 12/01/2009   Type 2 diabetes mellitus with neurological manifestations, controlled (HCC)     Current Outpatient Medications on File Prior to Visit  Medication Sig Dispense Refill   aspirin EC 81 MG tablet Take 81 mg by mouth daily.     atorvastatin (LIPITOR) 20 MG tablet TAKE 1 TABLET DAILY AT 6   P.M. 90 tablet 1   carvedilol (COREG) 6.25 MG tablet Take 1 tablet (6.25 mg total) by mouth 2 (two) times daily with a meal. 180 tablet 3   celecoxib (CELEBREX) 100 MG capsule TAKE 1 CAPSULE DAILY AS    NEEDED 90 capsule 1   cholecalciferol (VITAMIN D) 1000 UNITS tablet Take 1,000 Units by mouth daily.     HYDROcodone-acetaminophen (NORCO/VICODIN) 5-325 MG per tablet Take 1 tablet by mouth every 6 (six) hours as needed for  moderate pain.      imiquimod (ALDARA) 5 % cream PLEASE SEE ATTACHED FOR DETAILED DIRECTIONS     metFORMIN (GLUCOPHAGE-XR) 500 MG 24 hr tablet TAKE 1 TABLET TWICE A DAY 180 tablet 3   Multiple Vitamin (MULTIVITAMIN WITH MINERALS) TABS tablet Take 1 tablet by mouth daily.     niacin (NIASPAN) 1000 MG CR tablet TAKE 1 TABLET AT BEDTIME 90 tablet 3   triamcinolone acetonide (KENALOG-40) 40 MG/ML injection Inject 1 mL into the muscle once.     warfarin (JANTOVEN) 5 MG tablet TAKE AS DIRECTED BY THE    [facility] 75 tablet 1   No current facility-administered medications on file prior to visit.    Past Medical History:  Diagnosis Date   Colonic polyp 2011 last colo   colo q 5yr - [facility]    Coronary artery disease 02/2009   severe left main and three-vessel, CABG   DEGENERATIVE JOINT DISEASE    Diverticulosis of colon    DIVERTICULOSIS, COLON    HYPERLIPIDEMIA-MIXED    HYPERTENSION, BENIGN    LBP (low back pain)    s/p decompression 8/14; ESI - [place]; RFA x 3 early 2015   Pacemaker    a. 05/2017: St. Jude (serial number [id number]) pacemaker   Symptomatic bradycardia    a. s/p St. Jude (serial number [id number]) pacemaker 06/16/17   Type II or unspecified type  diabetes mellitus without mention of complication, not stated as uncontrolled    VITAMIN D DEFICIENCY     Past Surgical History:  Procedure Laterality Date   CARPAL TUNNEL RELEASE Right    CATARACT EXTRACTION, BILATERAL     R 07/07/13, L 07/21/13   CORONARY ARTERY BYPASS GRAFT  03/17/09   x 4   INGUINAL HERNIA REPAIR  1980   KNEE ARTHROSCOPY  2007   right   LUMBAR EPIDURAL INJECTION  2013   Has as needed.   LUMBAR LAMINECTOMY/DECOMPRESSION MICRODISCECTOMY N/A 06/19/2013   Procedure: LUMBAR LAMINECTOMY/DECOMPRESSION MICRODISCECTOMY;  Surgeon: Emilee Hero, MD;  Location: [facility] OR;  Service: Orthopedics;  Laterality: N/A;  Lumbar 4-5 decompression   PACEMAKER IMPLANT N/A 06/16/2017   Procedure: Pacemaker Implant;  Surgeon:  Marinus Maw, MD;  Location: [facility] INVASIVE CV LAB;  Service: Cardiovascular;  Laterality: N/A;   TRIGGER FINGER RELEASE Right     Social History   Socioeconomic History   Marital status: Married    Spouse name: Not on file   Number of children: Not on file   Years of education: Not on file   Highest education level: Not on file  Occupational History   Not on file  Tobacco Use   Smoking status: Never   Smokeless tobacco: Never   Tobacco comments:    Married, 3 grown children. 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 used  Substance and Sexual Activity   Alcohol use: Yes    Comment: RARE   Drug use: No   Sexual activity: Not on file  Other Topics Concern   Not on file  Social History Narrative   Not on file   Social Determinants of Health   Financial Resource Strain: Not on file  Food Insecurity: Not on file  Transportation Needs: Not on file  Physical Activity: Not on file  Stress: Not on file  Social Connections: Not on file    Family History  Problem Relation Age of Onset   Heart failure Mother        died age 57   Heart failure Father        died age 70   Diabetes Other    Heart disease Neg Hx     Review of Systems  Constitutional:  Negative for chills and fever.  Respiratory:  Negative for cough, shortness of breath and wheezing.   Cardiovascular:  Negative for chest pain, palpitations and leg swelling.  Musculoskeletal:  Negative for myalgias.  Neurological:  Positive for weakness (b/l leg). Negative for dizziness, light-headedness and headaches.      Objective:   Vitals:   04/01/21 0759  BP: 110/72  Pulse: 74  Temp: 98.3 F (36.8 C)  SpO2: 97%   BP Readings from Last 3 Encounters:  04/01/21 110/72  03/17/21 (!) 147/78  09/29/20 114/78   Wt Readings from Last 3 Encounters:  04/01/21 177 lb 12.8 oz (80.6 kg)  09/29/20 177 lb (80.3 kg)  08/25/20 176 lb (79.8 kg)   Body mass index is 27.03  kg/m.   Physical Exam    Constitutional: Appears well-developed and well-nourished. No distress.  HENT:  Head: Normocephalic and atraumatic.  Neck: Neck supple. No tracheal deviation present. No thyromegaly present.  No cervical lymphadenopathy Cardiovascular: Normal rate, regular rhythm and normal heart sounds.   No murmur heard. No carotid bruit .  No edema Pulmonary/Chest: Effort normal and breath sounds normal. No respiratory distress. No  has no wheezes. No rales.  Skin: Skin is warm and dry. Not diaphoretic.  Psychiatric: Normal mood and affect. Behavior is normal.      Assessment & Plan:    See Problem List for Assessment and Plan of chronic medical problems.    This visit occurred during the SARS-CoV-2 public health emergency.  Safety protocols were in place, including screening questions prior to the visit, additional usage of staff PPE, and extensive cleaning of exam room while observing appropriate contact time as indicated for disinfecting solutions.

## 2021-04-01 ENCOUNTER — Other Ambulatory Visit: Payer: Self-pay

## 2021-04-01 ENCOUNTER — Encounter: Payer: Self-pay | Admitting: Internal Medicine

## 2021-04-01 ENCOUNTER — Ambulatory Visit (INDEPENDENT_AMBULATORY_CARE_PROVIDER_SITE_OTHER): Payer: Medicare Other | Admitting: Internal Medicine

## 2021-04-01 VITALS — BP 110/72 | HR 74 | Temp 98.3°F | Ht 68.0 in | Wt 177.8 lb

## 2021-04-01 DIAGNOSIS — I10 Essential (primary) hypertension: Secondary | ICD-10-CM | POA: Diagnosis not present

## 2021-04-01 DIAGNOSIS — N1832 Chronic kidney disease, stage 3b: Secondary | ICD-10-CM

## 2021-04-01 DIAGNOSIS — E782 Mixed hyperlipidemia: Secondary | ICD-10-CM | POA: Diagnosis not present

## 2021-04-01 DIAGNOSIS — E1149 Type 2 diabetes mellitus with other diabetic neurological complication: Secondary | ICD-10-CM | POA: Diagnosis not present

## 2021-04-01 DIAGNOSIS — M159 Polyosteoarthritis, unspecified: Secondary | ICD-10-CM | POA: Diagnosis not present

## 2021-04-01 DIAGNOSIS — R29898 Other symptoms and signs involving the musculoskeletal system: Secondary | ICD-10-CM | POA: Insufficient documentation

## 2021-04-01 LAB — COMPREHENSIVE METABOLIC PANEL
ALT: 27 U/L (ref 0–53)
AST: 18 U/L (ref 0–37)
Albumin: 4 g/dL (ref 3.5–5.2)
Alkaline Phosphatase: 55 U/L (ref 39–117)
BUN: 44 mg/dL — ABNORMAL HIGH (ref 6–23)
CO2: 26 mEq/L (ref 19–32)
Calcium: 9.3 mg/dL (ref 8.4–10.5)
Chloride: 105 mEq/L (ref 96–112)
Creatinine, Ser: 1.56 mg/dL — ABNORMAL HIGH (ref 0.40–1.50)
GFR: 40.45 mL/min — ABNORMAL LOW (ref >60.00)
Glucose, Bld: 277 mg/dL — ABNORMAL HIGH (ref 70–99)
Potassium: 4.6 mEq/L (ref 3.5–5.1)
Sodium: 139 mEq/L (ref 135–145)
Total Bilirubin: 0.6 mg/dL (ref 0.2–1.2)
Total Protein: 6.6 g/dL (ref 6.0–8.3)

## 2021-04-01 LAB — CBC WITH DIFFERENTIAL/PLATELET
Basophils Absolute: 0 10*3/uL (ref 0.0–0.1)
Basophils Relative: 0.2 % (ref 0.0–3.0)
Eosinophils Absolute: 0.1 10*3/uL (ref 0.0–0.7)
Eosinophils Relative: 0.9 % (ref 0.0–5.0)
HCT: 37.8 % — ABNORMAL LOW (ref 39.0–52.0)
Hemoglobin: 12.6 g/dL — ABNORMAL LOW (ref 13.0–17.0)
Lymphocytes Relative: 21.7 % (ref 12.0–46.0)
Lymphs Abs: 1.9 10*3/uL (ref 0.7–4.0)
MCHC: 33.4 g/dL (ref 30.0–36.0)
MCV: 99.6 fl (ref 78.0–100.0)
Monocytes Absolute: 0.5 10*3/uL (ref 0.1–1.0)
Monocytes Relative: 6.2 % (ref 3.0–12.0)
Neutro Abs: 6.2 10*3/uL (ref 1.4–7.7)
Neutrophils Relative %: 71 % (ref 43.0–77.0)
Platelets: 177 10*3/uL (ref 150.0–400.0)
RBC: 3.79 Mil/uL — ABNORMAL LOW (ref 4.22–5.81)
RDW: 15.1 % (ref 11.5–15.5)
WBC: 8.8 10*3/uL (ref 4.0–10.5)

## 2021-04-01 LAB — HEMOGLOBIN A1C: Hgb A1c MFr Bld: 7.3 % — ABNORMAL HIGH (ref 4.6–6.5)

## 2021-04-01 NOTE — Addendum Note (Signed)
Addended by: Madelon Lips on: 04/01/2021 08:42 AM   Modules accepted: Orders

## 2021-04-01 NOTE — Assessment & Plan Note (Signed)
New B/l leg weakness - likely related to his back issues Unable to get out of a chair w/o using his arms Back pain is better Has done PT Discussed considering PT again

## 2021-04-01 NOTE — Assessment & Plan Note (Signed)
Chronic fairly controlled Continue celebrex 100 mg daily prn

## 2021-04-01 NOTE — Assessment & Plan Note (Signed)
Chronic Check lipid panel  Continue atorvastatin 20 mg daily Regular exercise and healthy diet encouraged  

## 2021-04-01 NOTE — Assessment & Plan Note (Signed)
Chronic BP well controlled Continue coreg 6.25 bid cmp

## 2021-04-01 NOTE — Assessment & Plan Note (Signed)
Chronic Lab Results  Component Value Date   HGBA1C 6.8 (H) 09/29/2020   Controlled  Continue metformin xr 500 mg bid Check a1c

## 2021-04-01 NOTE — Assessment & Plan Note (Signed)
Chronic cmp 

## 2021-04-08 NOTE — Progress Notes (Signed)
Remote pacemaker transmission.   

## 2021-04-15 ENCOUNTER — Other Ambulatory Visit: Payer: Self-pay | Admitting: Cardiovascular Disease

## 2021-04-15 ENCOUNTER — Other Ambulatory Visit: Payer: Self-pay | Admitting: Internal Medicine

## 2021-04-19 NOTE — Telephone Encounter (Signed)
Refill request

## 2021-04-20 ENCOUNTER — Other Ambulatory Visit: Payer: Self-pay

## 2021-04-20 ENCOUNTER — Ambulatory Visit (INDEPENDENT_AMBULATORY_CARE_PROVIDER_SITE_OTHER): Payer: Medicare Other | Admitting: Pharmacist

## 2021-04-20 DIAGNOSIS — I82409 Acute embolism and thrombosis of unspecified deep veins of unspecified lower extremity: Secondary | ICD-10-CM | POA: Diagnosis not present

## 2021-04-20 DIAGNOSIS — Z5181 Encounter for therapeutic drug level monitoring: Secondary | ICD-10-CM

## 2021-04-20 LAB — POCT INR: INR: 2.3 (ref 2.0–3.0)

## 2021-04-20 NOTE — Patient Instructions (Addendum)
Description   - continue warfarin dosage of 2.5 mg daily except 5 mg on MONDAYS, WEDNESDAYS & FRIDAYS. - Please be consistent w/ your green leafy vegetables  - pick a day (or days)  to have your greens and have them every week - Recheck in 6 weeks

## 2021-05-06 ENCOUNTER — Ambulatory Visit (INDEPENDENT_AMBULATORY_CARE_PROVIDER_SITE_OTHER)
Admission: RE | Admit: 2021-05-06 | Discharge: 2021-05-06 | Disposition: A | Discharge status: Home / Self Care | Payer: Medicare Other | Source: Ambulatory Visit | Attending: Internal Medicine | Admitting: Internal Medicine

## 2021-05-06 ENCOUNTER — Ambulatory Visit (INDEPENDENT_AMBULATORY_CARE_PROVIDER_SITE_OTHER): Payer: Medicare Other | Admitting: Internal Medicine

## 2021-05-06 ENCOUNTER — Ambulatory Visit
Admission: EM | Admit: 2021-05-06 | Discharge: 2021-05-06 | Disposition: A | Discharge status: Home / Self Care | Payer: Medicare Other | Attending: Urgent Care | Admitting: Urgent Care

## 2021-05-06 ENCOUNTER — Other Ambulatory Visit: Payer: Self-pay

## 2021-05-06 ENCOUNTER — Telehealth: Payer: Self-pay | Admitting: Internal Medicine

## 2021-05-06 ENCOUNTER — Encounter: Payer: Self-pay | Admitting: Internal Medicine

## 2021-05-06 VITALS — BP 144/88 | HR 64 | Temp 98.2°F | Ht 68.0 in | Wt 174.0 lb

## 2021-05-06 DIAGNOSIS — N200 Calculus of kidney: Secondary | ICD-10-CM

## 2021-05-06 DIAGNOSIS — R1032 Left lower quadrant pain: Secondary | ICD-10-CM

## 2021-05-06 DIAGNOSIS — R10814 Left lower quadrant abdominal tenderness: Secondary | ICD-10-CM

## 2021-05-06 DIAGNOSIS — E1149 Type 2 diabetes mellitus with other diabetic neurological complication: Secondary | ICD-10-CM

## 2021-05-06 DIAGNOSIS — R10A2 Flank pain, left side: Secondary | ICD-10-CM

## 2021-05-06 DIAGNOSIS — R3129 Other microscopic hematuria: Secondary | ICD-10-CM | POA: Insufficient documentation

## 2021-05-06 DIAGNOSIS — R319 Hematuria, unspecified: Secondary | ICD-10-CM

## 2021-05-06 DIAGNOSIS — R109 Unspecified abdominal pain: Secondary | ICD-10-CM

## 2021-05-06 DIAGNOSIS — M545 Low back pain, unspecified: Secondary | ICD-10-CM

## 2021-05-06 DIAGNOSIS — G8929 Other chronic pain: Secondary | ICD-10-CM

## 2021-05-06 DIAGNOSIS — M48061 Spinal stenosis, lumbar region without neurogenic claudication: Secondary | ICD-10-CM

## 2021-05-06 LAB — BASIC METABOLIC PANEL
BUN: 39 mg/dL — ABNORMAL HIGH (ref 6–23)
CO2: 27 mEq/L (ref 19–32)
Calcium: 9.4 mg/dL (ref 8.4–10.5)
Chloride: 105 mEq/L (ref 96–112)
Creatinine, Ser: 1.74 mg/dL — ABNORMAL HIGH (ref 0.40–1.50)
GFR: 35.46 mL/min — ABNORMAL LOW (ref >60.00)
Glucose, Bld: 141 mg/dL — ABNORMAL HIGH (ref 70–99)
Potassium: 4.6 mEq/L (ref 3.5–5.1)
Sodium: 139 mEq/L (ref 135–145)

## 2021-05-06 LAB — CBC WITH DIFFERENTIAL/PLATELET
Basophils Absolute: 0 10*3/uL (ref 0.0–0.1)
Basophils Relative: 0.2 % (ref 0.0–3.0)
Eosinophils Absolute: 0 10*3/uL (ref 0.0–0.7)
Eosinophils Relative: 0.3 % (ref 0.0–5.0)
HCT: 37.2 % — ABNORMAL LOW (ref 39.0–52.0)
Hemoglobin: 12.8 g/dL — ABNORMAL LOW (ref 13.0–17.0)
Lymphocytes Relative: 10.3 % — ABNORMAL LOW (ref 12.0–46.0)
Lymphs Abs: 1.2 10*3/uL (ref 0.7–4.0)
MCHC: 34.4 g/dL (ref 30.0–36.0)
MCV: 99.3 fl (ref 78.0–100.0)
Monocytes Absolute: 0.8 10*3/uL (ref 0.1–1.0)
Monocytes Relative: 6.7 % (ref 3.0–12.0)
Neutro Abs: 9.6 10*3/uL — ABNORMAL HIGH (ref 1.4–7.7)
Neutrophils Relative %: 82.5 % — ABNORMAL HIGH (ref 43.0–77.0)
Platelets: 195 10*3/uL (ref 150.0–400.0)
RBC: 3.75 Mil/uL — ABNORMAL LOW (ref 4.22–5.81)
RDW: 15 % (ref 11.5–15.5)
WBC: 11.7 10*3/uL — ABNORMAL HIGH (ref 4.0–10.5)

## 2021-05-06 LAB — POCT URINALYSIS DIP (MANUAL ENTRY)
Bilirubin, UA: NEGATIVE
Glucose, UA: NEGATIVE mg/dL
Ketones, POC UA: NEGATIVE mg/dL
Leukocytes, UA: NEGATIVE
Nitrite, UA: NEGATIVE
Spec Grav, UA: >=1.03 — AB (ref 1.010–1.025)
Urobilinogen, UA: 0.2 E.U./dL
pH, UA: 5.5 (ref 5.0–8.0)

## 2021-05-06 LAB — SEDIMENTATION RATE: Sed Rate: 20 mm/hr (ref 0–20)

## 2021-05-06 LAB — AMYLASE: Amylase: 52 U/L (ref 27–131)

## 2021-05-06 LAB — LIPASE: Lipase: 10 U/L — ABNORMAL LOW (ref 11.0–59.0)

## 2021-05-06 MED ORDER — TAMSULOSIN HCL 0.4 MG PO CAPS: 0.4000 mg | ORAL_CAPSULE | Freq: Every day | ORAL | 0 refills | Status: DC

## 2021-05-06 MED ORDER — OXYCODONE HCL 5 MG PO TABS: 5.0000 mg | ORAL_TABLET | ORAL | 0 refills | Status: DC | PRN

## 2021-05-06 MED ORDER — PROMETHAZINE HCL 12.5 MG PO TABS: 12.5000 mg | ORAL_TABLET | Freq: Four times a day (QID) | ORAL | 0 refills | Status: DC | PRN

## 2021-05-06 MED ORDER — IOHEXOL 300 MG/ML  SOLN
75.0000 mL | Freq: Once | INTRAMUSCULAR | Status: AC | PRN
  Administered 2021-05-06: 75 mL via INTRAVENOUS

## 2021-05-06 NOTE — Telephone Encounter (Signed)
Patient coming in this morning to see Dr. Yetta Barre.

## 2021-05-06 NOTE — Discharge Instructions (Addendum)
Please reach out to your regular doctor today to see if you can get an outpatient CT scan scheduled to fully evaluate your abdominal pain and make sure you do not have diverticulitis. In the meantime, since we saw blood in your urine we will try to manage this pain for a kidney stone. Make sure you hydrate very well with plain water. Use your regular back pain medication for this pain as well.

## 2021-05-06 NOTE — ED Triage Notes (Signed)
Pt states woke up at 2am with lt flank pain radiating to LLQ. States EMS came out and said it sounds like a kidney stone and to come to UC today. Pt denies hx of kidney stones. Pt denies any urinary sx's at this time.

## 2021-05-06 NOTE — Telephone Encounter (Signed)
(  Note from ER visit)  Therefore I do not suspect obstructive uropathy.  However, I did emphasize need to follow-up with his PCP as soon as possible and request consideration for obtaining a CT scan of the abdomen to fully evaluate and rule out acute diverticulitis.

## 2021-05-06 NOTE — Telephone Encounter (Signed)
   Patient is requesting an outpatient CT scan. He said that he went to UC this morning and they told him to ask his PCP to place the order. He can be reached at [phone]

## 2021-05-06 NOTE — Patient Instructions (Signed)

## 2021-05-06 NOTE — Addendum Note (Signed)
Addended by: Etta Grandchild on: 05/06/2021 03:54 PM   Modules accepted: Orders

## 2021-05-06 NOTE — Progress Notes (Addendum)
Subjective:  [name], male    DOB: 06/02/1934  Age: 87 y.o. MRN: [id number]  CC: Abdominal Pain  This visit occurred during the SARS-CoV-2 public health emergency.  Safety protocols were in place, including screening questions prior to the visit, additional usage of staff PPE, and extensive cleaning of exam room while observing appropriate contact time as indicated for disinfecting solutions.    HPI [name] presents for the complaint of acute onset LLQ abd 12 hours ago.  He was awakened from his sleep with sharp pain that was an 8 out of 10.  He is taken a couple of doses of hydrocodone and the pain is down to a 4 out of 10.  He has had a mild decrease in his appetite but denies nausea, vomiting, fever, chills, dysuria, or hematuria.  He was seen earlier today at an urgent care center and was found to have blood in his urine.  He was told to see his PCP to have a CT scan done.  Outpatient Medications Prior to Visit  Medication Sig Dispense Refill   aspirin EC 81 MG tablet Take 81 mg by mouth daily.     atorvastatin (LIPITOR) 20 MG tablet TAKE 1 TABLET DAILY AT     6:00PM 90 tablet 1   carvedilol (COREG) 6.25 MG tablet Take 1 tablet (6.25 mg total) by mouth 2 (two) times daily with a meal. 180 tablet 3   celecoxib (CELEBREX) 100 MG capsule TAKE 1 CAPSULE DAILY AS    NEEDED 90 capsule 1   cholecalciferol (VITAMIN D) 1000 UNITS tablet Take 1,000 Units by mouth daily.     HYDROcodone-acetaminophen (NORCO/VICODIN) 5-325 MG per tablet Take 1 tablet by mouth every 6 (six) hours as needed for moderate pain.      metFORMIN (GLUCOPHAGE-XR) 500 MG 24 hr tablet TAKE 1 TABLET TWICE A DAY 180 tablet 3   Multiple Vitamin (MULTIVITAMIN WITH MINERALS) TABS tablet Take 1 tablet by mouth daily.     niacin (NIASPAN) 1000 MG CR tablet TAKE 1 TABLET AT BEDTIME 90 tablet 3   tamsulosin (FLOMAX) 0.4 MG CAPS capsule Take 1 capsule (0.4 mg total) by mouth daily after supper. 30 capsule 0    triamcinolone acetonide (KENALOG-40) 40 MG/ML injection Inject 1 mL into the muscle once.     warfarin (JANTOVEN) 5 MG tablet TAKE AS DIRECTED BY THE    [facility] 75 tablet 3   imiquimod (ALDARA) 5 % cream PLEASE SEE ATTACHED FOR DETAILED DIRECTIONS     No facility-administered medications prior to visit.    ROS Review of Systems  Constitutional:  Positive for appetite change. Negative for chills, diaphoresis, fatigue and fever.  HENT: Negative.    Respiratory:  Negative for cough, shortness of breath and wheezing.   Gastrointestinal:  Positive for abdominal pain and constipation. Negative for abdominal distention, blood in stool, diarrhea, nausea and vomiting.  Genitourinary:  Positive for flank pain. Negative for difficulty urinating, dysuria, frequency, hematuria, penile swelling, scrotal swelling, testicular pain and urgency.  Neurological: Negative.  Negative for dizziness and weakness.  Hematological:  Negative for adenopathy. Does not bruise/bleed easily.  Psychiatric/Behavioral: Negative.     Objective:  BP (!) 144/88 (BP Location: Right Arm, Patient Position: Sitting, Cuff Size: Large)   Pulse 64   Temp 98.2 F (36.8 C) (Oral)   Ht 5\' 8"  (1.727 m)   Wt 174 lb (78.9 kg)   SpO2 98%   BMI 26.46 kg/m   BP  Readings from Last 3 Encounters:  05/06/21 (!) 144/88  05/06/21 123/79  04/01/21 110/72    Wt Readings from Last 3 Encounters:  05/06/21 174 lb (78.9 kg)  04/01/21 177 lb 12.8 oz (80.6 kg)  09/29/20 177 lb (80.3 kg)    Physical Exam Constitutional:      General: He is not in acute distress.    Appearance: He is not ill-appearing, toxic-appearing or diaphoretic.  HENT:     Nose: Nose normal.     Mouth/Throat:     Mouth: Mucous membranes are moist.  Eyes:     Conjunctiva/sclera: Conjunctivae normal.  Cardiovascular:     Rate and Rhythm: Normal rate and regular rhythm.  Pulmonary:     Effort: Pulmonary effort is normal.     Breath sounds: Examination of  the right-lower field reveals rales. Examination of the left-lower field reveals rales. Rales present. No decreased breath sounds, wheezing or rhonchi.  Abdominal:     General: Abdomen is flat.     Palpations: Abdomen is soft. There is no hepatomegaly, splenomegaly or mass.     Tenderness: There is abdominal tenderness in the left lower quadrant. There is no right CVA tenderness, left CVA tenderness or guarding.     Hernia: A hernia is present. Hernia is present in the ventral area. There is no hernia in the umbilical area, left inguinal area, right femoral area, left femoral area or right inguinal area.  Musculoskeletal:        General: Normal range of motion.     Right lower leg: No edema.  Skin:    General: Skin is warm and dry.     Coloration: Skin is not jaundiced.  Neurological:     General: No focal deficit present.     Mental Status: He is alert.    Lab Results  Component Value Date   WBC 11.7 (H) 05/06/2021   HGB 12.8 (L) 05/06/2021   HCT 37.2 (L) 05/06/2021   PLT 195.0 05/06/2021   GLUCOSE 141 (H) 05/06/2021   CHOL 140 09/29/2020   TRIG 98.0 09/29/2020   HDL 59.90 09/29/2020   LDLDIRECT 72.8 04/18/2011   LDLCALC 61 09/29/2020   ALT 27 04/01/2021   AST 18 04/01/2021   NA 139 05/06/2021   K 4.6 05/06/2021   CL 105 05/06/2021   CREATININE 1.74 (H) 05/06/2021   BUN 39 (H) 05/06/2021   CO2 27 05/06/2021   TSH 1.12 10/01/2019   PSA 0.6 09/22/2010   INR 2.3 04/20/2021   HGBA1C 7.3 (H) 04/01/2021   MICROALBUR 1.2 09/29/2020    CT Abdomen Pelvis W Contrast  Result Date: 05/06/2021 CLINICAL DATA:  Left lower quadrant abdominal pain today. Microscopic hematuria. Abdominal abscess/infection suspected. EXAM: CT ABDOMEN AND PELVIS WITH CONTRAST TECHNIQUE: Multidetector CT imaging of the abdomen and pelvis was performed using the standard protocol following bolus administration of intravenous contrast. Contrast volume reduced due to renal insufficiency. CONTRAST:  82mL  OMNIPAQUE IOHEXOL 300 MG/ML  SOLN COMPARISON:  Limited correlation made with lumbar spine CT 03/17/2021. FINDINGS: Lower chest: Mild scarring at both lung bases. There is mild cardiomegaly status post median sternotomy. Pacemaker leads are seen within the right atrium and right ventricle. No significant pleural or pericardial effusion. There is a small hiatal hernia. Hepatobiliary: The liver is normal in density without suspicious focal abnormality. No evidence of gallstones, gallbladder wall thickening or biliary dilatation. Pancreas: Mildly atrophied without focal abnormality. No evidence of pancreatic ductal dilatation or surrounding inflammation. Spleen: Normal  in size without focal abnormality. Adrenals/Urinary Tract: Small punctate calcification in the left adrenal gland. No adrenal mass. There is an obstructing calculus in the proximal left ureter, measuring up to 8 mm on coronal image 39/5. This results in decreased left renal enhancement and delayed excretion. The left renal pelvis and proximal ureter are moderately dilated, and there is perinephric soft tissue stranding. This calculus appears to be faintly visible on the scout image. There is a small nonobstructing calculus in the lower pole of the right kidney. No evidence of right ureteral calculus or obstruction. The bladder appears unremarkable. Stomach/Bowel: Enteric contrast was administered and has passed into the proximal small bowel. As above, small hiatal hernia. The stomach and small bowel otherwise appear unremarkable. The appendix appears normal. There are scattered diverticula throughout the colon without evidence of obstruction, wall thickening or surrounding inflammation. Vascular/Lymphatic: Aortic and branch vessel atherosclerosis without aneurysm large vessel occlusion. The portal, superior mesenteric and splenic veins are patent. Reproductive: The prostate gland and seminal vesicles appear normal. Other: Intact abdominal wall. No  ascites, free air or focal extraluminal fluid collection. Musculoskeletal: No acute or significant osseous findings. There are degenerative changes in the spine associated with a convex left thoracolumbar scoliosis. IMPRESSION: 1. Obstructing 8 mm calculus in the proximal left ureter with associated hydronephrosis and delayed contrast excretion. 2. Nonobstructing calculus in the lower pole of the right kidney. 3. No evidence of abscess or acute abdominal inflammation. 4. Incidental findings including a small hiatal hernia, colonic diverticulosis, scoliosis and Aortic Atherosclerosis (ICD10-I70.0). 5. These results will be called to the ordering clinician or representative by the Radiologist Assistant, and communication documented in the PACS or [employer]. Electronically Signed   By: Carey Bullocks M.D.   On: 05/06/2021 15:45     Assessment & Plan:   Joandry was seen today for abdominal pain.  Diagnoses and all orders for this visit:  Other microscopic hematuria- Will screen for renal pathology with a CT abdomen/pelvis with contrast. -     CT Abdomen Pelvis W Contrast; Future  Left lower quadrant abdominal tenderness without rebound tenderness- Will check labs to screen for blood loss, infection, ruptured abdominal viscus.  I recommended that he undergo a CT with contrast to see if there is an abscess or mass. -     CBC with Differential/Platelet; Future -     Basic metabolic panel; Future -     Amylase; Future -     Lipase; Future -     Sedimentation rate; Future -     CT Abdomen Pelvis W Contrast; Future -     Sedimentation rate -     Lipase -     Amylase -     Basic metabolic panel -     CBC with Differential/Platelet  I have discontinued Tasia Catchings Milliman's imiquimod. I am also having him maintain his cholecalciferol, aspirin EC, multivitamin with minerals, HYDROcodone-acetaminophen, carvedilol, celecoxib, metFORMIN, niacin, triamcinolone acetonide, atorvastatin, warfarin, and  tamsulosin.  No orders of the defined types were placed in this encounter.    Follow-up: Return in about 1 week (around 05/13/2021).  Sanda Linger, MD

## 2021-05-06 NOTE — ED Provider Notes (Signed)
[facility]   MRN: [id number] DOB: October 30, 1933  Subjective:   Andrew [name] is a 87 y.o. male presenting for acute onset persistent moderate left lower abdominal pain, left flank pain.  The pain woke him up out of his sleep.  He actually ended up taking hydrocodone which has helped with some of his pain.  Has a history of chronic back pain, lumbar stenosis, degenerative disc disease of the lumbar region and therefore uses this.  Denies fever, nausea, vomiting, diarrhea, constipation, bloody stools.  Denies history of kidney stones.  He does have a history of diverticulosis of the colon but has never had diverticulitis.  He actually does have some intermittent constipation due to the use of his pain medication.  However, his last complete bowel movement was yesterday night.  No current facility-administered medications for this encounter.  Current Outpatient Medications:    HYDROcodone-acetaminophen (NORCO/VICODIN) 5-325 MG per tablet, Take 1 tablet by mouth every 6 (six) hours as needed for moderate pain. , Disp: , Rfl:    aspirin EC 81 MG tablet, Take 81 mg by mouth daily., Disp: , Rfl:    atorvastatin (LIPITOR) 20 MG tablet, TAKE 1 TABLET DAILY AT     6:00PM, Disp: 90 tablet, Rfl: 1   carvedilol (COREG) 6.25 MG tablet, Take 1 tablet (6.25 mg total) by mouth 2 (two) times daily with a meal., Disp: 180 tablet, Rfl: 3   celecoxib (CELEBREX) 100 MG capsule, TAKE 1 CAPSULE DAILY AS    NEEDED, Disp: 90 capsule, Rfl: 1   cholecalciferol (VITAMIN D) 1000 UNITS tablet, Take 1,000 Units by mouth daily., Disp: , Rfl:    imiquimod (ALDARA) 5 % cream, PLEASE SEE ATTACHED FOR DETAILED DIRECTIONS, Disp: , Rfl:    metFORMIN (GLUCOPHAGE-XR) 500 MG 24 hr tablet, TAKE 1 TABLET TWICE A DAY, Disp: 180 tablet, Rfl: 3   Multiple Vitamin (MULTIVITAMIN WITH MINERALS) TABS tablet, Take 1 tablet by mouth daily., Disp: , Rfl:    niacin (NIASPAN) 1000 MG CR tablet, TAKE 1 TABLET AT BEDTIME, Disp: 90 tablet,  Rfl: 3   triamcinolone acetonide (KENALOG-40) 40 MG/ML injection, Inject 1 mL into the muscle once., Disp: , Rfl:    warfarin (JANTOVEN) 5 MG tablet, TAKE AS DIRECTED BY THE    [facility], Disp: 75 tablet, Rfl: 3   Allergies  Allergen Reactions   Penicillins Other (See Comments)    Taste buds    Past Medical History:  Diagnosis Date   Colonic polyp 2011 last colo   colo q 49yr - [facility]    Coronary artery disease 02/2009   severe left main and three-vessel, CABG   DEGENERATIVE JOINT DISEASE    Diverticulosis of colon    DIVERTICULOSIS, COLON    HYPERLIPIDEMIA-MIXED    HYPERTENSION, BENIGN    LBP (low back pain)    s/p decompression 8/14; ESI - [place]; RFA x 3 early 2015   Pacemaker    a. 05/2017: St. Jude (serial number [id number]) pacemaker   Symptomatic bradycardia    a. s/p St. Jude (serial number [id number]) pacemaker 06/16/17   Type II or unspecified type diabetes mellitus without mention of complication, not stated as uncontrolled    VITAMIN D DEFICIENCY      Past Surgical History:  Procedure Laterality Date   CARPAL TUNNEL RELEASE Right    CATARACT EXTRACTION, BILATERAL     R 07/07/13, L 07/21/13   CORONARY ARTERY BYPASS GRAFT  03/17/09   x 4   INGUINAL HERNIA REPAIR  1980   KNEE ARTHROSCOPY  2007   right   LUMBAR EPIDURAL INJECTION  2013   Has as needed.   LUMBAR LAMINECTOMY/DECOMPRESSION MICRODISCECTOMY N/A 06/19/2013   Procedure: LUMBAR LAMINECTOMY/DECOMPRESSION MICRODISCECTOMY;  Surgeon: Emilee Hero, MD;  Location: [facility] OR;  Service: Orthopedics;  Laterality: N/A;  Lumbar 4-5 decompression   PACEMAKER IMPLANT N/A 06/16/2017   Procedure: Pacemaker Implant;  Surgeon: Marinus Maw, MD;  Location: [facility] INVASIVE CV LAB;  Service: Cardiovascular;  Laterality: N/A;   TRIGGER FINGER RELEASE Right     Family History  Problem Relation Age of Onset   Heart failure Mother        died age 63   Heart failure Father        died age 64   Diabetes Other    Heart  disease Neg Hx     Social History   Tobacco Use   Smoking status: Never   Smokeless tobacco: Never   Tobacco comments:    Married, 3 grown children. 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    Appearance: Normal appearance. He is well-developed and normal weight. He is not ill-appearing, toxic-appearing or diaphoretic.  HENT:     Head: Normocephalic and atraumatic.     Right Ear: External ear normal.     Left Ear: External ear normal.     Nose: Nose normal.     Mouth/Throat:     Pharynx: Oropharynx is clear.  Eyes:     General: No scleral icterus.       Right eye: No discharge.        Left eye: No discharge.     Extraocular Movements: Extraocular movements intact.     Pupils: Pupils are equal, round, and reactive to light.  Cardiovascular:     Rate and Rhythm: Normal rate and regular rhythm.     Heart sounds: No murmur heard.   No friction rub. No gallop.  Pulmonary:     Effort: Pulmonary effort is normal. No respiratory distress.     Breath sounds: No wheezing or rales.  Abdominal:     General: Bowel sounds are normal. There is no distension.     Palpations: Abdomen is soft. There is no mass.     Tenderness: There is abdominal tenderness in the left lower quadrant. There is no right CVA tenderness, left CVA tenderness, guarding or rebound.  Musculoskeletal:     Cervical back: Normal range of motion.  Skin:    General: Skin is warm and dry.  Neurological:     Mental Status: He is alert and oriented to person, place, and time.  Psychiatric:        Mood and Affect: Mood normal.        Behavior: Behavior normal.        Thought Content:  Thought content normal.        Judgment: Judgment normal.    Results for orders placed or performed during the hospital encounter of 05/06/21 (from the past 24 hour(s))  POCT  urinalysis dipstick     Status: Abnormal   Collection Time: 05/06/21  8:53 AM  Result Value Ref Range   Color, UA yellow yellow   Clarity, UA clear clear   Glucose, UA negative negative mg/dL   Bilirubin, UA negative negative   Ketones, POC UA negative negative mg/dL   Spec Grav, UA >=9.774 (A) 1.010 - 1.025   Blood, UA moderate (A) negative   pH, UA 5.5 5.0 - 8.0   Protein Ur, POC trace (A) negative mg/dL   Urobilinogen, UA 0.2 0.2 or 1.0 E.U./dL   Nitrite, UA Negative Negative   Leukocytes, UA Negative Negative    Assessment and Plan :   I have reviewed the PDMP during this encounter.  1. Abdominal pain, left lower quadrant   2. Left flank pain   3. Chronic low back pain, unspecified back pain laterality, unspecified whether sciatica present   4. Spinal stenosis of lumbar region, unspecified whether neurogenic claudication present     Have concerns about his abdominal pain being more related to his diverticulosis, possible diverticulitis.  I discussed this with them at length.  However, due to the hematuria that we see on urinalysis we will try to manage for renal colic.  I did explain that this could be related to his CKD stage III but he does report that his urine has been darker and cloudier than usual.  He is not straining to urinate.  Therefore I do not suspect obstructive uropathy.  However, I did emphasize need to follow-up with his PCP as soon as possible and request consideration for obtaining a CT scan of the abdomen to fully evaluate and rule out acute diverticulitis. Counseled patient on potential for adverse effects with medications prescribed/recommended today, ER and return-to-clinic precautions discussed, patient verbalized understanding.    Wallis Bamberg, [place] 05/06/21 1423

## 2021-05-10 ENCOUNTER — Other Ambulatory Visit: Payer: Self-pay | Admitting: Urology

## 2021-05-10 ENCOUNTER — Ambulatory Visit (HOSPITAL_COMMUNITY)
Admission: AD | Admit: 2021-05-10 | Discharge: 2021-05-10 | Disposition: A | Discharge status: Home / Self Care | Payer: Medicare Other | Attending: Urology | Admitting: Urology

## 2021-05-10 ENCOUNTER — Inpatient Hospital Stay (HOSPITAL_COMMUNITY): Payer: Medicare Other

## 2021-05-10 ENCOUNTER — Inpatient Hospital Stay (HOSPITAL_COMMUNITY): Payer: Medicare Other | Admitting: Anesthesiology

## 2021-05-10 ENCOUNTER — Encounter (HOSPITAL_COMMUNITY)
Admission: AD | Disposition: A | Discharge status: Home / Self Care | Payer: Self-pay | Source: Home / Self Care | Attending: Urology

## 2021-05-10 ENCOUNTER — Other Ambulatory Visit: Payer: Self-pay

## 2021-05-10 ENCOUNTER — Encounter (HOSPITAL_COMMUNITY): Payer: Self-pay | Admitting: Urology

## 2021-05-10 DIAGNOSIS — Z880 Allergy status to penicillin: Secondary | ICD-10-CM | POA: Insufficient documentation

## 2021-05-10 DIAGNOSIS — Z950 Presence of cardiac pacemaker: Secondary | ICD-10-CM | POA: Insufficient documentation

## 2021-05-10 DIAGNOSIS — N201 Calculus of ureter: Principal | ICD-10-CM | POA: Insufficient documentation

## 2021-05-10 DIAGNOSIS — Z86718 Personal history of other venous thrombosis and embolism: Secondary | ICD-10-CM | POA: Insufficient documentation

## 2021-05-10 DIAGNOSIS — N200 Calculus of kidney: Secondary | ICD-10-CM

## 2021-05-10 HISTORY — PX: CYSTOSCOPY WITH STENT PLACEMENT: SHX5790

## 2021-05-10 LAB — GLUCOSE, CAPILLARY
Glucose-Capillary: 120 mg/dL — ABNORMAL HIGH (ref 70–99)
Glucose-Capillary: 154 mg/dL — ABNORMAL HIGH (ref 70–99)

## 2021-05-10 SURGERY — CYSTOSCOPY, WITH STENT INSERTION
Anesthesia: General | Site: Urethra | Laterality: Left

## 2021-05-10 MED ORDER — OXYCODONE HCL 5 MG/5ML PO SOLN
5.0000 mg | Freq: Once | ORAL | Status: DC | PRN
Start: 2021-05-10 — End: 2021-05-11

## 2021-05-10 MED ORDER — LIDOCAINE 2% (20 MG/ML) 5 ML SYRINGE
INTRAMUSCULAR | Status: AC
  Filled 2021-05-10: qty 5

## 2021-05-10 MED ORDER — FENTANYL CITRATE (PF) 100 MCG/2ML IJ SOLN
INTRAMUSCULAR | Status: DC | PRN
  Administered 2021-05-10: 100 ug via INTRAVENOUS

## 2021-05-10 MED ORDER — IOHEXOL 300 MG/ML  SOLN
INTRAMUSCULAR | Status: DC | PRN
  Administered 2021-05-10: 5 mL via URETHRAL

## 2021-05-10 MED ORDER — TRAMADOL HCL 50 MG PO TABS: 50.0000 mg | ORAL_TABLET | Freq: Four times a day (QID) | ORAL | 0 refills | Status: DC | PRN

## 2021-05-10 MED ORDER — FENTANYL CITRATE (PF) 100 MCG/2ML IJ SOLN: 25.0000 ug | INTRAMUSCULAR | Status: DC | PRN

## 2021-05-10 MED ORDER — ACETAMINOPHEN 500 MG PO TABS
1000.0000 mg | ORAL_TABLET | Freq: Once | ORAL | Status: AC
  Administered 2021-05-10: 1000 mg via ORAL
  Filled 2021-05-10: qty 2

## 2021-05-10 MED ORDER — DEXAMETHASONE SODIUM PHOSPHATE 10 MG/ML IJ SOLN
INTRAMUSCULAR | Status: DC | PRN
  Administered 2021-05-10: 5 mg via INTRAVENOUS

## 2021-05-10 MED ORDER — CIPROFLOXACIN IN D5W 400 MG/200ML IV SOLN
INTRAVENOUS | Status: AC
  Filled 2021-05-10: qty 200

## 2021-05-10 MED ORDER — FENTANYL CITRATE (PF) 100 MCG/2ML IJ SOLN
INTRAMUSCULAR | Status: AC
  Filled 2021-05-10: qty 2

## 2021-05-10 MED ORDER — DEXAMETHASONE SODIUM PHOSPHATE 10 MG/ML IJ SOLN
INTRAMUSCULAR | Status: AC
  Filled 2021-05-10: qty 1

## 2021-05-10 MED ORDER — ONDANSETRON HCL 4 MG/2ML IJ SOLN
INTRAMUSCULAR | Status: DC | PRN
  Administered 2021-05-10: 4 mg via INTRAVENOUS

## 2021-05-10 MED ORDER — PHENYLEPHRINE 40 MCG/ML (10ML) SYRINGE FOR IV PUSH (FOR BLOOD PRESSURE SUPPORT)
PREFILLED_SYRINGE | INTRAVENOUS | Status: DC | PRN
  Administered 2021-05-10 (×2): 80 ug via INTRAVENOUS

## 2021-05-10 MED ORDER — PROMETHAZINE HCL 25 MG/ML IJ SOLN
6.2500 mg | INTRAMUSCULAR | Status: DC | PRN
Start: 2021-05-10 — End: 2021-05-11

## 2021-05-10 MED ORDER — PHENAZOPYRIDINE HCL 200 MG PO TABS: 200.0000 mg | ORAL_TABLET | Freq: Three times a day (TID) | ORAL | 0 refills | Status: DC | PRN

## 2021-05-10 MED ORDER — STERILE WATER FOR IRRIGATION IR SOLN
Status: DC | PRN
  Administered 2021-05-10: 3000 mL

## 2021-05-10 MED ORDER — PROPOFOL 10 MG/ML IV BOLUS
INTRAVENOUS | Status: AC
  Filled 2021-05-10: qty 20

## 2021-05-10 MED ORDER — PHENYLEPHRINE 40 MCG/ML (10ML) SYRINGE FOR IV PUSH (FOR BLOOD PRESSURE SUPPORT)
PREFILLED_SYRINGE | INTRAVENOUS | Status: AC
  Filled 2021-05-10: qty 10

## 2021-05-10 MED ORDER — LACTATED RINGERS IV SOLN: INTRAVENOUS | Status: DC

## 2021-05-10 MED ORDER — LIDOCAINE 2% (20 MG/ML) 5 ML SYRINGE
INTRAMUSCULAR | Status: DC | PRN
  Administered 2021-05-10: 100 mg via INTRAVENOUS

## 2021-05-10 MED ORDER — ONDANSETRON HCL 4 MG/2ML IJ SOLN
INTRAMUSCULAR | Status: AC
  Filled 2021-05-10: qty 2

## 2021-05-10 MED ORDER — OXYCODONE HCL 5 MG PO TABS: 5.0000 mg | ORAL_TABLET | Freq: Once | ORAL | Status: DC | PRN

## 2021-05-10 MED ORDER — CIPROFLOXACIN IN D5W 400 MG/200ML IV SOLN
400.0000 mg | INTRAVENOUS | Status: AC
  Administered 2021-05-10: 400 mg via INTRAVENOUS

## 2021-05-10 MED ORDER — PROPOFOL 10 MG/ML IV BOLUS
INTRAVENOUS | Status: DC | PRN
Start: 2021-05-10 — End: 2021-05-10
  Administered 2021-05-10: 130 mg via INTRAVENOUS

## 2021-05-10 SURGICAL SUPPLY — 11 items
BAG URO CATCHER STRL LF (MISCELLANEOUS) ×2 IMPLANT
CATH URET 5FR 28IN OPEN ENDED (CATHETERS) ×2 IMPLANT
CLOTH BEACON ORANGE TIMEOUT ST (SAFETY) ×2 IMPLANT
GLOVE SURG ENC TEXT LTX SZ7.5 (GLOVE) ×2 IMPLANT
GOWN STRL REUS W/TWL XL LVL3 (GOWN DISPOSABLE) ×2 IMPLANT
GUIDEWIRE STR DUAL SENSOR (WIRE) ×2 IMPLANT
KIT TURNOVER KIT A (KITS) ×2 IMPLANT
MANIFOLD NEPTUNE II (INSTRUMENTS) ×2 IMPLANT
PACK CYSTO (CUSTOM PROCEDURE TRAY) ×2 IMPLANT
STENT URET 6FRX24 CONTOUR (STENTS) ×2 IMPLANT
TUBING CONNECTING 10 (TUBING) ×2 IMPLANT

## 2021-05-10 NOTE — Transfer of Care (Signed)
Immediate Anesthesia Transfer of Care Note  Patient: Andrew [name]  Procedure(s) Performed: CYSTOSCOPY WITH LEFT  STENT PLACEMENT,LEFT RETROGRADE PYELOGRAM (Left: Urethra)  Patient Location: PACU  Anesthesia Type:General  Level of Consciousness: awake, alert  and oriented  Airway & Oxygen Therapy: Patient Spontanous Breathing and Patient connected to face mask oxygen  Post-op Assessment: Report given to RN and Post -op Vital signs reviewed and stable  Post vital signs: Reviewed and stable  Last Vitals:  Vitals Value Taken Time  BP    Temp    Pulse    Resp 11 05/10/21 1827  SpO2    Vitals shown include unvalidated device data.  Last Pain:  Vitals:   05/10/21 1617  TempSrc:   PainSc: 0-No pain         Complications: No notable events documented.

## 2021-05-10 NOTE — Interval H&P Note (Signed)
History and Physical Interval Note:  05/10/2021 5:46 PM  Andrew [name]  has presented today for surgery, with the diagnosis of stones.  The various methods of treatment have been discussed with the patient and family. After consideration of risks, benefits and other options for treatment, the patient has consented to  Procedure(s): CYSTOSCOPY WITH LEFT  STENT PLACEMENT (Left) as a surgical intervention.  The patient's history has been reviewed, patient examined, no change in status, stable for surgery.  I have reviewed the patient's chart and labs.  Questions were answered to the patient's satisfaction.     Crist Fat

## 2021-05-10 NOTE — Anesthesia Preprocedure Evaluation (Addendum)
Anesthesia Evaluation  Patient identified by MRN, date of birth, ID band Patient awake    Reviewed: Allergy & Precautions, NPO status , Patient's Chart, lab work & pertinent test results, reviewed documented beta blocker date and time   History of Anesthesia Complications Negative for: history of anesthetic complications  Airway Mallampati: II  TM Distance: >3 FB Neck ROM: Full    Dental  (+) Missing,    Pulmonary neg pulmonary ROS,    Pulmonary exam normal        Cardiovascular hypertension, Pt. on medications and Pt. on home beta blockers + CAD and + CABG (2010)  Normal cardiovascular exam+ dysrhythmias (3rd degree AVB) + pacemaker   Nuclear stress 2018: EF 49%, low risk study   Neuro/Psych negative neurological ROS  negative psych ROS   GI/Hepatic negative GI ROS, Neg liver ROS,   Endo/Other  diabetes, Type 2, Oral Hypoglycemic Agents  Renal/GU Renal disease  negative genitourinary   Musculoskeletal  (+) Arthritis ,   Abdominal   Peds  Hematology negative hematology ROS (+)   Anesthesia Other Findings Day of surgery medications reviewed with patient.  Reproductive/Obstetrics negative OB ROS                            Anesthesia Physical Anesthesia Plan  ASA: 3  Anesthesia Plan: General   Post-op Pain Management:    Induction: Intravenous  PONV Risk Score and Plan: 2 and Treatment may vary due to age or medical condition, Ondansetron and Dexamethasone  Airway Management Planned: LMA  Additional Equipment: None  Intra-op Plan:   Post-operative Plan: Extubation in OR  Informed Consent: I have reviewed the patients History and Physical, chart, labs and discussed the procedure including the risks, benefits and alternatives for the proposed anesthesia with the patient or authorized representative who has indicated his/her understanding and acceptance.       Plan Discussed  with: CRNA  Anesthesia Plan Comments:        Anesthesia Quick Evaluation

## 2021-05-10 NOTE — H&P (Signed)
Acute Kidney Stone  HPI: Andrew [name] is a 87 year-old male patient who is here for further eval and management of kidney stones.  He was diagnosed with a kidney stone on approximately 05/06/2021.   His pain started about approximately 05/05/2021. The pain is on the left side.   Abdomen/Pelvic CT: CT - 7/15, 43mm left proximal stone. The patient underwent No imaging prior to today's appointment.   The patient relates initially having nausea, vomiting, flank pain, and groin pain. He is currently having flank pain, groin pain, and nausea. He denies having back pain, vomiting, fever, chills, and voiding symptoms. He has not caught a stone in his urine strainer since his symptoms began.   He has never had surgical treatment for calculi in the past. This is his first kidney stone.   The patient is on Coumadin and aspirin for atrial fibrillation.     ALLERGIES: Penicillin    MEDICATIONS: Metformin Hcl  Vicodin  Warfarin Sodium 5 mg tablet  Aspirin Ec 81 mg tablet, delayed release  Celebrex  Coreg  Multivitamin  Niacin  Vitamin D3     GU PSH: No GU PSH      PSH Notes: pacemaker insertion  quadruple bypass 1010  back surgery   NON-GU PSH: No Non-GU PSH    GU PMH: None   NON-GU PMH: Arthritis Diabetes Type 2 DVT, History Hypercholesterolemia    FAMILY HISTORY: 1 Daughter - Daughter 2 sons - Son   SOCIAL HISTORY: Marital Status: Married Preferred Language: English; Ethnicity: Not Hispanic Or Latino; Race: White Current Smoking Status: Patient has never smoked.   Tobacco Use Assessment Completed: Used Tobacco in last 30 days? Has never drank.  Does not drink caffeine.    REVIEW OF SYSTEMS:    GU Review Male:   Patient reports get up at night to urinate. Patient denies frequent urination, hard to postpone urination, burning/ pain with urination, leakage of urine, stream starts and stops, trouble starting your stream, have to strain to urinate , erection problems, and  penile pain.  Gastrointestinal (Upper):   Patient denies nausea, vomiting, and indigestion/ heartburn.  Gastrointestinal (Lower):   Patient denies diarrhea and constipation.  Constitutional:   Patient denies fever, night sweats, weight loss, and fatigue.  Skin:   Patient denies skin rash/ lesion and itching.  Eyes:   Patient denies blurred vision and double vision.  Ears/ Nose/ Throat:   Patient denies sore throat and sinus problems.  Hematologic/Lymphatic:   Patient denies swollen glands and easy bruising.  Cardiovascular:   Patient denies leg swelling and chest pains.  Respiratory:   Patient denies cough and shortness of breath.  Endocrine:   Patient denies excessive thirst.  Musculoskeletal:   Patient denies back pain and joint pain.  Neurological:   Patient denies headaches and dizziness.  Psychologic:   Patient denies depression and anxiety.   Notes: blood In urine    VITAL SIGNS:      05/10/2021 10:54 AM  Weight 10 lb / 4.54 kg  Height 68 in / 172.72 cm  BP 89/55 mmHg  Pulse 89 /min  Temperature 96.9 F / 36.0 C  BMI 1.5 kg/m   MULTI-SYSTEM PHYSICAL EXAMINATION:    Constitutional: Well-nourished. No physical deformities. Normally developed. Good grooming.  Neck: Neck symmetrical, not swollen. Normal tracheal position.  Respiratory: Normal breath sounds. No labored breathing, no use of accessory muscles.   Cardiovascular: Regular rate and rhythm. No murmur, no gallop. Normal temperature, normal extremity pulses, no  swelling, no varicosities.   Lymphatic: No enlargement of neck, axillae, groin.  Skin: No paleness, no jaundice, no cyanosis. No lesion, no ulcer, no rash.  Neurologic / Psychiatric: Oriented to time, oriented to place, oriented to person. No depression, no anxiety, no agitation.  Gastrointestinal: No mass, no tenderness, no rigidity, non obese abdomen.  Eyes: Normal conjunctivae. Normal eyelids.  Ears, Nose, Mouth, and Throat: Left ear no scars, no lesions, no  masses. Right ear no scars, no lesions, no masses. Nose no scars, no lesions, no masses. Normal hearing. Normal lips.  Musculoskeletal: Normal gait and station of head and neck.     Complexity of Data:  Source Of History:  Patient  Records Review:   Previous Doctor Records, Previous Hospital Records, Previous Patient Records  Urine Test Review:   Urinalysis  X-Ray Review: C.T. Abdomen/Pelvis: Reviewed Films. Discussed With Patient.     PROCEDURES:         KUB - 74018  A single view of the abdomen is obtained. Renal shadows are easily visualized bilaterally. There are no stones appreciated within the expected location in either renal pelvis. There are no additional calcifications along the expected location of either ureter bilaterally.  Gas pattern is grossly normal. No significant bony abnormalities.      . Patient confirmed No Neulasta OnPro Device.           Urinalysis w/Scope - 81001 Dipstick Dipstick Cont'd Micro  Color: Yellow Bilirubin: Neg WBC/hpf: NS (Not Seen)  Appearance: Clear Ketones: Trace RBC/hpf: 0 - 2/hpf  Specific Gravity: 1.025 Blood: 1+ Bacteria: NS (Not Seen)  pH: <=5.0 Protein: Neg Cystals: NS (Not Seen)  Glucose: Neg Urobilinogen: 0.2 Casts: NS (Not Seen)    Nitrites: Neg Trichomonas: Not Present    Leukocyte Esterase: Neg Mucous: Not Present      Epithelial Cells: 0 - 5/hpf      Yeast: NS (Not Seen)      Sperm: Not Present    Notes:  microscopic not concentrated           Ketoralac 30mg - 96372, [id number] Qty: 30 Adm. By: Trisha Fredricksen  Unit: mg Lot No [id number]  Route: IM Exp. Date 09/21/2022  Freq: None Mfgr.: [id number]  Site: Left Buttock   ASSESSMENT:      ICD-10 Details  1 GU:   Ureteral calculus - N20.1    PLAN:           Orders X-Rays: KUB          Schedule Return Visit/Planned Activity: ASAP - Schedule Surgery          Document Letter(s):  Created for Patient: Clinical Summary    The patient presents today with an  infected stone or very poorly controlled pain. Given their circumstance I recommended that we proceed urgently to the OR today for stent placement. This will allow the kidney to drain along with any associated infection and will help control their pain. They understand that the stent is temporary and the patient will require an additional procedure to treat the stone. I explained the procedure with the patient and described the expected outcome. I also explained to them the stent discomfort and potential for worsening voiding symptoms. We will see how the do before, during and after the procedure and decide whether the patient will require admission to the hospital.     

## 2021-05-10 NOTE — Discharge Instructions (Signed)
DISCHARGE INSTRUCTIONS FOR KIDNEY STONE/URETERAL STENT   MEDICATIONS:  1.  Resume all your other meds from home - except do not take any extra narcotic pain meds that you may have at home.  2.  Pyridium is to help with the burning/stinging when you urinate. 3.  Tramadol is for moderate/severe pain, otherwise taking upto 1000 mg every 6 hours of plainTylenol will help treat your pain.   4.  Tamsulosin is for stent discomfort.  This should be taken until the stent is removed.  ACTIVITY:  1. No strenuous activity x 1week  2. No driving while on narcotic pain medications  3. Drink plenty of water  4. Continue to walk at home - you can still get blood clots when you are at home, so keep active, but don't over do it.  5. May return to work/school tomorrow or when you feel ready   BATHING:  1. You can shower and we recommend daily showers  2. You have a string coming from your urethra: The stent string is attached to your ureteral stent. Do not pull on this.   SIGNS/SYMPTOMS TO CALL:  Please call us if you have a fever greater than 101.5, uncontrolled nausea/vomiting, uncontrolled pain, dizziness, unable to urinate, bloody urine, chest pain, shortness of breath, leg swelling, leg pain, redness around wound, drainage from wound, or any other concerns or questions.   You can reach [place] at [phone].   FOLLOW-UP:  1. We will contact you for f/u with the next surgery date.

## 2021-05-10 NOTE — Anesthesia Procedure Notes (Signed)
Procedure Name: LMA Insertion Date/Time: 05/10/2021 6:05 PM Performed by: Jhonnie Garner, CRNA Pre-anesthesia Checklist: Patient identified, Emergency Drugs available, Suction available and Patient being monitored Patient Re-evaluated:Patient Re-evaluated prior to induction Oxygen Delivery Method: Circle system utilized Preoxygenation: Pre-oxygenation with 100% oxygen Induction Type: IV induction Ventilation: Mask ventilation without difficulty LMA: LMA inserted LMA Size: 4.0 Number of attempts: 1 Placement Confirmation: positive ETCO2, CO2 detector and breath sounds checked- equal and bilateral Tube secured with: Tape Dental Injury: Teeth and Oropharynx as per pre-operative assessment

## 2021-05-10 NOTE — Anesthesia Postprocedure Evaluation (Signed)
Anesthesia Post Note  Patient: Andrew [name]  Procedure(s) Performed: CYSTOSCOPY WITH LEFT  STENT PLACEMENT,LEFT RETROGRADE PYELOGRAM (Left: Urethra)     Patient location during evaluation: PACU Anesthesia Type: General Level of consciousness: awake and alert and oriented Pain management: pain level controlled Vital Signs Assessment: post-procedure vital signs reviewed and stable Respiratory status: spontaneous breathing, nonlabored ventilation and respiratory function stable Cardiovascular status: blood pressure returned to baseline Postop Assessment: no apparent nausea or vomiting Anesthetic complications: no   No notable events documented.  Last Vitals:  Vitals:   05/10/21 1900 05/10/21 1908  BP: (!) 150/82 (!) 160/83  Pulse: 72   Resp: 12 13  Temp: 36.7 C   SpO2: 97% 98%    Last Pain:  Vitals:   05/10/21 1908  TempSrc:   PainSc: 0-No pain                 Kaylyn Layer

## 2021-05-11 ENCOUNTER — Telehealth: Payer: Self-pay | Admitting: Cardiovascular Disease

## 2021-05-11 ENCOUNTER — Encounter (HOSPITAL_COMMUNITY): Payer: Self-pay | Admitting: Urology

## 2021-05-11 ENCOUNTER — Other Ambulatory Visit: Payer: Self-pay | Admitting: Urology

## 2021-05-11 ENCOUNTER — Telehealth: Payer: Self-pay | Admitting: Internal Medicine

## 2021-05-11 NOTE — Op Note (Signed)
Preoperative diagnosis:  Left renal colic/obstructing stones   Postoperative diagnosis:  same   Procedure:  Cystoscopy left ureteral stent placement left retrograde pyelography with interpretation   Surgeon: Crist Fat, MD  Anesthesia: General  Complications: None  Intraoperative findings:  left retrograde pyelography demonstrated a filling defect within the left ureter consistent with the patient's known calculus without other abnormalities.  EBL: Minimal  Specimens: None  Indication: Andrew [name] is a 87 y.o. patient with an obstructing left ureteral stone. After reviewing the management options for treatment, he elected to proceed with the above surgical procedure(s). We have discussed the potential benefits and risks of the procedure, side effects of the proposed treatment, the likelihood of the patient achieving the goals of the procedure, and any potential problems that might occur during the procedure or recuperation. Informed consent has been obtained.  Description of procedure:  The patient was taken to the operating room and general anesthesia was induced.  The patient was placed in the dorsal lithotomy position, prepped and draped in the usual sterile fashion, and preoperative antibiotics were administered. A preoperative time-out was performed.   Cystourethroscopy was performed.  The patient's urethra was examined and was normal. The bladder was then systematically examined in its entirety. There was no evidence for any bladder tumors, stones, or other mucosal pathology.    Attention then turned to the leftureteral orifice and a ureteral catheter was used to intubate the ureteral orifice.  Omnipaque contrast was injected through the ureteral catheter and a retrograde pyelogram was performed with findings as dictated above.  A 0.38 sensor guidewire was then advanced up the left ureter into the renal pelvis under fluoroscopic guidance.  The wire was then  backloaded through the cystoscope and a ureteral stent was advance over the wire using Seldinger technique.  The stent was positioned appropriately under fluoroscopic and cystoscopic guidance.  The wire was then removed with an adequate stent curl noted in the renal pelvis as well as in the bladder.  The bladder was then emptied and the procedure ended.  The patient appeared to tolerate the procedure well and without complications.  The patient was able to be awakened and transferred to the recovery unit in satisfactory condition.    Crist Fat, M.D.

## 2021-05-11 NOTE — Telephone Encounter (Signed)
LVM for pt to rtn my call to schedule AWV with [facility]. Please schedule this appt if pt comes to the office or calls.  

## 2021-05-11 NOTE — Telephone Encounter (Signed)
   [facility] Pre-operative Risk Assessment    Patient Name: Isaack Preble  DOB: 23-Jun-1934 MRN: [id number]  HEARTCARE STAFF:  - IMPORTANT!!!!!! Under Visit Info/Reason for Call, type in Other and utilize the format Clearance MM/DD/YY or Clearance TBD. Do not use dashes or single digits. - Please review there is not already an duplicate clearance open for this procedure. - If request is for dental extraction, please clarify the # of teeth to be extracted. - If the patient is currently at the dentist's office, call Pre-Op Callback Staff (MA/nurse) to input urgent request.  - If the patient is not currently in the dentist office, please route to the Pre-Op pool.  Request for surgical clearance:  What type of surgery is being performed? Ureteroscopy   When is this surgery scheduled? 06/03/21  What type of clearance is required (medical clearance vs. Pharmacy clearance to hold med vs. Both)? both  Are there any medications that need to be held prior to surgery and how long? warfarin  Practice name and name of physician performing surgery? [facility] Dr. Louis Meckel procedure at [facility]   What is the office phone number? [place]    7.   What is the office fax number? [phone]   8.   Anesthesia type (None, local, MAC, general) ? General    Clarisse Gouge 05/11/2021, 1:39 PM  _________________________________________________________________   (provider comments below)

## 2021-05-12 ENCOUNTER — Encounter: Payer: Self-pay | Admitting: Internal Medicine

## 2021-05-12 ENCOUNTER — Ambulatory Visit (INDEPENDENT_AMBULATORY_CARE_PROVIDER_SITE_OTHER): Payer: Medicare Other | Admitting: Internal Medicine

## 2021-05-12 ENCOUNTER — Other Ambulatory Visit: Payer: Self-pay

## 2021-05-12 VITALS — BP 142/72 | HR 68 | Temp 98.4°F | Resp 16 | Ht 68.0 in | Wt 174.0 lb

## 2021-05-12 DIAGNOSIS — E785 Hyperlipidemia, unspecified: Secondary | ICD-10-CM

## 2021-05-12 DIAGNOSIS — E1149 Type 2 diabetes mellitus with other diabetic neurological complication: Secondary | ICD-10-CM

## 2021-05-12 DIAGNOSIS — Z0000 Encounter for general adult medical examination without abnormal findings: Secondary | ICD-10-CM | POA: Diagnosis not present

## 2021-05-12 DIAGNOSIS — I10 Essential (primary) hypertension: Secondary | ICD-10-CM | POA: Diagnosis not present

## 2021-05-12 DIAGNOSIS — Z0001 Encounter for general adult medical examination with abnormal findings: Secondary | ICD-10-CM

## 2021-05-12 NOTE — Patient Instructions (Signed)
Health Maintenance, Male Adopting a healthy lifestyle and getting preventive care are important in promoting health and wellness. Ask your health care provider about: The right schedule for you to have regular tests and exams. Things you can do on your own to prevent diseases and keep yourself healthy. What should I know about diet, weight, and exercise? Eat a healthy diet  Eat a diet that includes plenty of vegetables, fruits, low-fat dairy products, and lean protein. Do not eat a lot of foods that are high in solid fats, added sugars, or sodium.  Maintain a healthy weight Body mass index (BMI) is a measurement that can be used to identify possible weight problems. It estimates body fat based on height and weight. Your health care provider can help determine your BMI and help you achieve or maintain ahealthy weight. Get regular exercise Get regular exercise. This is one of the most important things you can do for your health. Most adults should: Exercise for at least 150 minutes each week. The exercise should increase your heart rate and make you sweat (moderate-intensity exercise). Do strengthening exercises at least twice a week. This is in addition to the moderate-intensity exercise. Spend less time sitting. Even light physical activity can be beneficial. Watch cholesterol and blood lipids Have your blood tested for lipids and cholesterol at 85 years of age, then havethis test every 5 years. You may need to have your cholesterol levels checked more often if: Your lipid or cholesterol levels are high. You are older than 85 years of age. You are at high risk for heart disease. What should I know about cancer screening? Many types of cancers can be detected early and may often be prevented. Depending on your health history and family history, you may need to have cancer screening at various ages. This may include screening for: Colorectal cancer. Prostate cancer. Skin cancer. Lung  cancer. What should I know about heart disease, diabetes, and high blood pressure? Blood pressure and heart disease High blood pressure causes heart disease and increases the risk of stroke. This is more likely to develop in people who have high blood pressure readings, are of African descent, or are overweight. Talk with your health care provider about your target blood pressure readings. Have your blood pressure checked: Every 3-5 years if you are 18-39 years of age. Every year if you are 40 years old or older. If you are between the ages of 65 and 75 and are a current or former smoker, ask your health care provider if you should have a one-time screening for abdominal aortic aneurysm (AAA). Diabetes Have regular diabetes screenings. This checks your fasting blood sugar level. Have the screening done: Once every three years after age 45 if you are at a normal weight and have a low risk for diabetes. More often and at a younger age if you are overweight or have a high risk for diabetes. What should I know about preventing infection? Hepatitis B If you have a higher risk for hepatitis B, you should be screened for this virus. Talk with your health care provider to find out if you are at risk forhepatitis B infection. Hepatitis C Blood testing is recommended for: Everyone born from 1945 through 1965. Anyone with known risk factors for hepatitis C. Sexually transmitted infections (STIs) You should be screened each year for STIs, including gonorrhea and chlamydia, if: You are sexually active and are younger than 85 years of age. You are older than 85 years of age   and your health care provider tells you that you are at risk for this type of infection. Your sexual activity has changed since you were last screened, and you are at increased risk for chlamydia or gonorrhea. Ask your health care provider if you are at risk. Ask your health care provider about whether you are at high risk for HIV.  Your health care provider may recommend a prescription medicine to help prevent HIV infection. If you choose to take medicine to prevent HIV, you should first get tested for HIV. You should then be tested every 3 months for as long as you are taking the medicine. Follow these instructions at home: Lifestyle Do not use any products that contain nicotine or tobacco, such as cigarettes, e-cigarettes, and chewing tobacco. If you need help quitting, ask your health care provider. Do not use street drugs. Do not share needles. Ask your health care provider for help if you need support or information about quitting drugs. Alcohol use Do not drink alcohol if your health care provider tells you not to drink. If you drink alcohol: Limit how much you have to 0-2 drinks a day. Be aware of how much alcohol is in your drink. In the [place] one drink equals one 12 oz bottle of beer (355 mL), one 5 oz glass of wine (148 mL), or one 1 oz glass of hard liquor (44 mL). General instructions Schedule regular health, dental, and eye exams. Stay current with your vaccines. Tell your health care provider if: You often feel depressed. You have ever been abused or do not feel safe at home. Summary Adopting a healthy lifestyle and getting preventive care are important in promoting health and wellness. Follow your health care provider's instructions about healthy diet, exercising, and getting tested or screened for diseases. Follow your health care provider's instructions on monitoring your cholesterol and blood pressure. This information is not intended to replace advice given to you by your health care provider. Make sure you discuss any questions you have with your healthcare provider. Document Revised: 10/02/2018 Document Reviewed: 10/02/2018 Elsevier Patient Education  2022 Elsevier Inc.  

## 2021-05-12 NOTE — Progress Notes (Signed)
Subjective:  [name], male    DOB: [birth year]  Age: 87 y.o. MRN: [id number]  CC: Annual Exam, Hypertension, Hyperlipidemia, and Diabetes  This visit occurred during the SARS-CoV-2 public health emergency.  Safety protocols were in place, including screening questions prior to the visit, additional usage of staff PPE, and extensive cleaning of exam room while observing appropriate contact time as indicated for disinfecting solutions.    HPI Andrew [name] presents for a CPX and f/up -   Since I last saw him he has had a stent placed in his left ureter.  The flank pain has resolved.  He denies nausea, vomiting, fever, chills, dysuria, or hematuria.  Outpatient Medications Prior to Visit  Medication Sig Dispense Refill   aspirin EC 81 MG tablet Take 81 mg by mouth daily.     atorvastatin (LIPITOR) 20 MG tablet TAKE 1 TABLET DAILY AT     6:00PM (Patient taking differently: Take 20 mg by mouth daily. TAKE 1 TABLET DAILY AT     6:00PM) 90 tablet 1   carvedilol (COREG) 6.25 MG tablet Take 1 tablet (6.25 mg total) by mouth 2 (two) times daily with a meal. 180 tablet 3   celecoxib (CELEBREX) 100 MG capsule TAKE 1 CAPSULE DAILY AS    NEEDED (Patient taking differently: Take 100 mg by mouth 2 (two) times daily.) 90 capsule 1   cholecalciferol (VITAMIN D) 1000 UNITS tablet Take 1,000 Units by mouth daily.     metFORMIN (GLUCOPHAGE-XR) 500 MG 24 hr tablet TAKE 1 TABLET TWICE A DAY (Patient taking differently: Take 500 mg by mouth in the morning and at bedtime.) 180 tablet 3   Multiple Vitamin (MULTIVITAMIN WITH MINERALS) TABS tablet Take 1 tablet by mouth daily.     niacin (NIASPAN) 1000 MG CR tablet TAKE 1 TABLET AT BEDTIME (Patient taking differently: Take 1,000 mg by mouth at bedtime.) 90 tablet 3   traMADol (ULTRAM) 50 MG tablet Take 1-2 tablets (50-100 mg total) by mouth every 6 (six) hours as needed for moderate pain. 15 tablet 0   warfarin (JANTOVEN) 5 MG tablet TAKE  AS DIRECTED BY THE    COUMADIN CLINIC (Patient taking differently: Take 2.5-5 mg by mouth See admin instructions. TAKE AS DIRECTED BY THE COUMADIN CLINIC; 5 mg Mon Wed and Fri, and 2.5 mg all other days) 75 tablet 3   phenazopyridine (PYRIDIUM) 200 MG tablet Take 1 tablet (200 mg total) by mouth 3 (three) times daily as needed for pain. 10 tablet 0   promethazine (PHENERGAN) 12.5 MG tablet Take 1 tablet (12.5 mg total) by mouth every 6 (six) hours as needed for nausea or vomiting. 30 tablet 0   tamsulosin (FLOMAX) 0.4 MG CAPS capsule Take 1 capsule (0.4 mg total) by mouth daily after supper. 30 capsule 0   triamcinolone acetonide (KENALOG-40) 40 MG/ML injection Inject 1 mL into the muscle once.     No facility-administered medications prior to visit.    ROS Review of Systems  Constitutional:  Negative for chills, diaphoresis, fatigue and fever.  HENT: Negative.    Eyes:  Negative for visual disturbance.  Respiratory:  Negative for cough, shortness of breath and wheezing.   Cardiovascular:  Negative for chest pain, palpitations and leg swelling.  Gastrointestinal:  Negative for abdominal pain, diarrhea and nausea.  Endocrine: Negative.   Genitourinary: Negative.  Negative for difficulty urinating, dysuria, flank pain, frequency and hematuria.  Musculoskeletal: Negative.   Skin: Negative.   Neurological:  Negative for dizziness, weakness, light-headedness and headaches.  Hematological:  Negative for adenopathy. Does not bruise/bleed easily.  Psychiatric/Behavioral: Negative.     Objective:  BP (!) 142/72 (BP Location: Right Arm, Patient Position: Sitting, Cuff Size: Large)   Pulse 68   Temp 98.4 F (36.9 C) (Oral)   Resp 16   Ht 5\' 8"  (1.727 m)   Wt 174 lb (78.9 kg)   SpO2 98%   BMI 26.46 kg/m   BP Readings from Last 3 Encounters:  05/12/21 (!) 142/72  05/10/21 (!) 161/86  05/06/21 (!) 144/88    Wt Readings from Last 3 Encounters:  05/12/21 174 lb (78.9 kg)  05/10/21 170 lb  (77.1 kg)  05/06/21 174 lb (78.9 kg)    Physical Exam Vitals reviewed.  Constitutional:      Appearance: He is not ill-appearing.  Eyes:     Conjunctiva/sclera: Conjunctivae normal.  Cardiovascular:     Rate and Rhythm: Normal rate and regular rhythm.     Heart sounds: No murmur heard. Pulmonary:     Effort: Pulmonary effort is normal.     Breath sounds: No stridor. No wheezing, rhonchi or rales.  Abdominal:     Palpations: There is no mass.     Tenderness: There is no abdominal tenderness. There is no guarding.     Hernia: No hernia is present.  Musculoskeletal:        General: Normal range of motion.     Cervical back: Neck supple.     Right lower leg: No edema.     Left lower leg: No edema.  Lymphadenopathy:     Cervical: No cervical adenopathy.  Skin:    General: Skin is warm and dry.  Neurological:     General: No focal deficit present.     Mental Status: He is alert. Mental status is at baseline.  Psychiatric:        Mood and Affect: Mood normal.        Behavior: Behavior normal.    Lab Results  Component Value Date   WBC 11.7 (H) 05/06/2021   HGB 12.8 (L) 05/06/2021   HCT 37.2 (L) 05/06/2021   PLT 195.0 05/06/2021   GLUCOSE 141 (H) 05/06/2021   CHOL 140 09/29/2020   TRIG 98.0 09/29/2020   HDL 59.90 09/29/2020   LDLDIRECT 72.8 04/18/2011   LDLCALC 61 09/29/2020   ALT 27 04/01/2021   AST 18 04/01/2021   NA 139 05/06/2021   K 4.6 05/06/2021   CL 105 05/06/2021   CREATININE 1.74 (H) 05/06/2021   BUN 39 (H) 05/06/2021   CO2 27 05/06/2021   TSH 1.12 10/01/2019   PSA 0.6 09/22/2010   INR 2.3 04/20/2021   HGBA1C 7.3 (H) 04/01/2021   MICROALBUR 1.2 09/29/2020    DG C-Arm 1-60 Min-No Report  Result Date: 05/10/2021 Fluoroscopy was utilized by the requesting physician.  No radiographic interpretation.    Assessment & Plan:   Andrew [name] was seen today for annual exam, hypertension, hyperlipidemia and diabetes.  Diagnoses and all orders for this  visit:  Type 2 diabetes mellitus with neurological manifestations, controlled (HCC)- His blood sugar is adequately well controlled.  HYPERTENSION, BENIGN- His blood pressure is well controlled.  Dyslipidemia, goal LDL below 70- LDL goal achieved. Doing well on the statin   Encounter for general adult medical examination with abnormal findings- Exam completed, labs reviewed, he refused a pneumonia vaccine booster, no cancer screenings indicated, patient education was given.  I have discontinued Tasia Catchings  P. Isbell's triamcinolone acetonide, tamsulosin, promethazine, and phenazopyridine. I am also having him maintain his cholecalciferol, aspirin EC, multivitamin with minerals, carvedilol, celecoxib, metFORMIN, niacin, atorvastatin, warfarin, and traMADol.  No orders of the defined types were placed in this encounter.    Follow-up: Return in about 6 months (around 11/12/2021).  Sanda Linger, MD

## 2021-05-15 DIAGNOSIS — Z0001 Encounter for general adult medical examination with abnormal findings: Secondary | ICD-10-CM | POA: Insufficient documentation

## 2021-05-16 NOTE — Telephone Encounter (Signed)
Patient with diagnosis of DVT (07/2017) on warfarin for anticoagulation.    Procedure: ureteroscopy Date of procedure: 06/03/21  CrCl 35.3 Platelet count 195  Per office protocol, patient can hold warfarin for 5 days prior to procedure.   Patient will not need bridging with Lovenox (enoxaparin) around procedure.  If not bridging, patient should restart warfarin on the evening of procedure or day after, at discretion of procedure MD

## 2021-05-16 NOTE — Telephone Encounter (Signed)
Pt has appt with DR. Gollan 7/26 for pre op clearance. Will forward clearance notes to MD for upcoming appt. Will send FYI to surgeon's office pt has appt 05/17/21.

## 2021-05-16 NOTE — Telephone Encounter (Signed)
Primary Cardiologist:None  Chart reviewed as part of pre-operative protocol coverage. Because of Andrew [name]'s past medical history and time since last visit, he/she will require a follow-up visit in order to better assess preoperative cardiovascular risk.  Pre-op covering staff: - Please schedule appointment and call patient to inform them. - Please contact requesting surgeon's office via preferred method (i.e, phone, fax) to inform them of need for appointment prior to surgery.  If applicable, this message will also be routed to pharmacy pool and/or primary cardiologist for input on holding anticoagulant/antiplatelet agent as requested below so that this information is available at time of patient's appointment.   Ronney Asters, NP  05/16/2021, 9:12 AM

## 2021-05-16 NOTE — Telephone Encounter (Signed)
Will send a message to [place] office to see if they may be able to squeeze pt in for pre op clearance before 06/03/21. Pt has appt with [facility] 06/01/21.

## 2021-05-16 NOTE — Progress Notes (Signed)
Cardiology Office Note  Date:  05/17/2021   ID:  Kristen, [place]way [birth year], MRN [id number]  PCP:  Pincus Sanes, MD   Chief Complaint  Patient presents with   Other    Cardiac clearance no complaints today. Meds reviewed verbally with pt.    HPI:  Mr. Shuck is a very pleasant 87 year old gentleman with history of  coronary artery disease,  bypass in May 2010, Chronic hip and back pain, can't walk  hx of near syncope  hyperlipidemia,  diabetes,  hypertension,  complete heart block 06/15/2017, pacer Prior DVT , on long-term anticoagulation, who presents for follow-up of his coronary artery disease  In follow-up today reports having difficulty with 8 cm kidney stone Has stent in place, Jun 03 2021, surgery planned for kidney stone removal Followed by urology, [place], Dr.Herrick Will hold warfarin   As he has not been feeling well, he is Losing weight, BP low, eating less Has mild general malaise which he attributes to the kidney stone  Constipation, hemorrhoid On norco  Denies any chest pain concerning for angina, no recent tachypalpitations  Labs reviewed CR 1.74, BUN 39  EKG personally reviewed by myself on todays visit Paced rhythm rate 77 bpm, pcv  Other past medical history reviewed hospitalization for complete heart block 06/15/2017 Bradycardic heart rate in the 20 to 30s [employer] Jude pacemaker placed June 11 2017, by Dr. Ladona Ridgel  Stress test: 12/27/2016: no ischemia Results reviewed with him in detail  Event monitor 12/21/2016:  Results reviewed with him in detail Normal sinus rhythm Min HR of 43 bpm, max HR of 117 bpm, and avg HR of 72 bpm.  Rare APCs and PVCs likely causing symptoms No other significnat arrhythmia  History of a pinched nerve, back surgery 06/19/2013. He continues to have back pain, status post radiofrequency treatments x3 through [facility].   PMH:   has a past medical history of Colonic polyp (2011  last colo), Coronary artery disease (02/2009), DEGENERATIVE JOINT DISEASE, Diverticulosis of colon, DIVERTICULOSIS, COLON, HYPERLIPIDEMIA-MIXED, HYPERTENSION, BENIGN, LBP (low back pain), Pacemaker, Symptomatic bradycardia, Type II or unspecified type diabetes mellitus without mention of complication, not stated as uncontrolled, and VITAMIN D DEFICIENCY.  PSH:    Past Surgical History:  Procedure Laterality Date   CARPAL TUNNEL RELEASE Right    CATARACT EXTRACTION, BILATERAL     R 07/07/13, L 07/21/13   CORONARY ARTERY BYPASS GRAFT  03/17/09   x 4   CYSTOSCOPY WITH STENT PLACEMENT Left 05/10/2021   Procedure: CYSTOSCOPY WITH LEFT  STENT PLACEMENT,LEFT RETROGRADE PYELOGRAM;  Surgeon: Crist Fat, MD;  Location: [facility];  Service: Urology;  Laterality: Left;   INGUINAL HERNIA REPAIR  1980   KNEE ARTHROSCOPY  2007   right   LUMBAR EPIDURAL INJECTION  2013   Has as needed.   LUMBAR LAMINECTOMY/DECOMPRESSION MICRODISCECTOMY N/A 06/19/2013   Procedure: LUMBAR LAMINECTOMY/DECOMPRESSION MICRODISCECTOMY;  Surgeon: Emilee Hero, MD;  Location: [facility];  Service: Orthopedics;  Laterality: N/A;  Lumbar 4-5 decompression   PACEMAKER IMPLANT N/A 06/16/2017   Procedure: Pacemaker Implant;  Surgeon: Marinus Maw, MD;  Location: [facility] INVASIVE CV LAB;  Service: Cardiovascular;  Laterality: N/A;   TRIGGER FINGER RELEASE Right     Current Outpatient Medications  Medication Sig Dispense Refill   aspirin EC 81 MG tablet Take 81 mg by mouth daily.     atorvastatin (LIPITOR) 20 MG tablet TAKE 1 TABLET DAILY AT     6:00PM (Patient  taking differently: Take 20 mg by mouth daily. TAKE 1 TABLET DAILY AT     6:00PM) 90 tablet 1   carvedilol (COREG) 6.25 MG tablet Take 1 tablet (6.25 mg total) by mouth 2 (two) times daily with a meal. 180 tablet 3   celecoxib (CELEBREX) 100 MG capsule TAKE 1 CAPSULE DAILY AS    NEEDED (Patient taking differently: Take 100 mg by mouth 2 (two) times daily.) 90 capsule 1    cholecalciferol (VITAMIN D) 1000 UNITS tablet Take 1,000 Units by mouth daily.     HYDROcodone-acetaminophen (NORCO/VICODIN) 5-325 MG tablet Take 1 tablet by mouth every 6 (six) hours as needed for moderate pain.     metFORMIN (GLUCOPHAGE-XR) 500 MG 24 hr tablet TAKE 1 TABLET TWICE A DAY (Patient taking differently: Take 500 mg by mouth in the morning and at bedtime.) 180 tablet 3   Multiple Vitamin (MULTIVITAMIN WITH MINERALS) TABS tablet Take 1 tablet by mouth daily.     niacin (NIASPAN) 1000 MG CR tablet TAKE 1 TABLET AT BEDTIME (Patient taking differently: Take 1,000 mg by mouth at bedtime.) 90 tablet 3   promethazine (PHENERGAN) 12.5 MG tablet Take 12.5 mg by mouth every 6 (six) hours as needed for nausea or vomiting.     tamsulosin (FLOMAX) 0.4 MG CAPS capsule Take 0.4 mg by mouth daily after supper.     traMADol (ULTRAM) 50 MG tablet Take 1-2 tablets (50-100 mg total) by mouth every 6 (six) hours as needed for moderate pain. 15 tablet 0   warfarin (JANTOVEN) 5 MG tablet TAKE AS DIRECTED BY THE    COUMADIN CLINIC (Patient taking differently: Take 2.5-5 mg by mouth See admin instructions. TAKE AS DIRECTED BY THE COUMADIN CLINIC; 5 mg Mon Wed and Fri, and 2.5 mg all other days) 75 tablet 3   No current facility-administered medications for this visit.     Allergies:   Penicillins   Social History:  The patient  reports that he has never smoked. He has never used smokeless tobacco. He reports current alcohol use. He reports that he does not use drugs.   Family History:   family history includes Diabetes in an other family member; Heart failure in his father and mother.    Review of Systems: Review of Systems  Constitutional:  Positive for weight loss.  HENT: Negative.    Respiratory: Negative.    Cardiovascular: Negative.   Gastrointestinal: Negative.   Musculoskeletal:  Positive for back pain and joint pain.  Neurological: Negative.   Psychiatric/Behavioral: Negative.    All other  systems reviewed and are negative.  PHYSICAL EXAM: VS:  BP 100/60 (BP Location: Left Arm, Patient Position: Sitting, Cuff Size: Normal)   Pulse 77   Ht 5\' 8"  (1.727 m)   Wt 173 lb (78.5 kg)   SpO2 98%   BMI 26.30 kg/m  , BMI Body mass index is 26.3 kg/m. Constitutional:  oriented to person, place, and time. No distress.  HENT:  Head: Grossly normal Eyes:  no discharge. No scleral icterus.  Neck: No JVD, no carotid bruits  Cardiovascular: Regular rate and rhythm, no murmurs appreciated Pulmonary/Chest: Clear to auscultation bilaterally, no wheezes or rails Abdominal: Soft.  no distension.  no tenderness.  Musculoskeletal: Normal range of motion Neurological:  normal muscle tone. Coordination normal. No atrophy Skin: Skin warm and dry Psychiatric: normal affect, pleasant  Recent Labs: 04/01/2021: ALT 27 05/06/2021: BUN 39; Creatinine, Ser 1.74; Hemoglobin 12.8; Platelets 195.0; Potassium 4.6; Sodium 139  Lipid Panel Lab Results  Component Value Date   CHOL 140 09/29/2020   HDL 59.90 09/29/2020   LDLCALC 61 09/29/2020   TRIG 98.0 09/29/2020      Wt Readings from Last 3 Encounters:  05/17/21 173 lb (78.5 kg)  05/12/21 174 lb (78.9 kg)  05/10/21 170 lb (77.1 kg)     ASSESSMENT AND PLAN:  Preop cardiovascular Urologic procedure planned Jun 03 2021 Clear from cardiac perspective No further testing needed Hold warfarin at least 5 days prior to procedure If possible stay on asa 81 mg daily given CAD/hx of CABG -DVT prophylaxis with SCIDS/teds when off warfarin  Pure hypercholesterolemia -  Cholesterol is at goal on the current lipid regimen. No changes to the medications were made.  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 No further workup at this time. Continue current medication regimen. Prior stress test with no ischemia Unable to exercise secondary to chronic back pain No further testing at this time  Type 2 diabetes mellitus with neurological manifestations, controlled (HCC) Weight down, numbers controlled  Complete heart block,pacemaker Seen by Dr. Ladona Ridgel 2021 annual follow-up  Arthritis Tylenol   Total encounter time more than 25 minutes  Greater than 50% was spent in counseling and coordination of care with the patient   Signed, Dossie Arbour, M.D., Ph.D. 05/17/2021  [facility] [place], [place] [phone]

## 2021-05-17 ENCOUNTER — Encounter: Payer: Self-pay | Admitting: Cardiovascular Disease

## 2021-05-17 ENCOUNTER — Other Ambulatory Visit: Payer: Self-pay

## 2021-05-17 ENCOUNTER — Ambulatory Visit (INDEPENDENT_AMBULATORY_CARE_PROVIDER_SITE_OTHER): Payer: Medicare Other | Admitting: Cardiovascular Disease

## 2021-05-17 VITALS — BP 100/60 | HR 77 | Ht 68.0 in | Wt 173.0 lb

## 2021-05-17 DIAGNOSIS — I442 Atrioventricular block, complete: Secondary | ICD-10-CM

## 2021-05-17 DIAGNOSIS — Z950 Presence of cardiac pacemaker: Secondary | ICD-10-CM | POA: Diagnosis not present

## 2021-05-17 DIAGNOSIS — E1149 Type 2 diabetes mellitus with other diabetic neurological complication: Secondary | ICD-10-CM

## 2021-05-17 DIAGNOSIS — I10 Essential (primary) hypertension: Secondary | ICD-10-CM

## 2021-05-17 DIAGNOSIS — I25708 Atherosclerosis of coronary artery bypass graft(s), unspecified, with other forms of angina pectoris: Secondary | ICD-10-CM | POA: Diagnosis not present

## 2021-05-17 DIAGNOSIS — E782 Mixed hyperlipidemia: Secondary | ICD-10-CM

## 2021-05-17 DIAGNOSIS — I82409 Acute embolism and thrombosis of unspecified deep veins of unspecified lower extremity: Secondary | ICD-10-CM

## 2021-05-17 NOTE — Progress Notes (Addendum)
COVID Vaccine Completed: Yes x4 Date COVID Vaccine completed: 12-01-19 & 12-24-19 Has received booster: 09-06-20, x2 COVID vaccine manufacturer: Pfizer     Date of COVID positive in last 90 days:  No  PCP - Cheryll Cockayne, MD Cardiologist -  Julien Nordmann, MD   Cardiac clearance in note dated 05-17-21 by Dr. Mariah Milling.  Chest x-ray - N/A EKG - 08-25-20 Epic Stress Test - 12-27-16 Epic ECHO - 11-30-09 Epic Cardiac Cath - Unsure Pacemaker/ICD device last checked:  03-17-21 in Epic.  Device orders in Epic Spinal Cord Stimulator: Long Term Monitor - 12-21-16 Epic  Sleep Study - N/A CPAP -   Fasting Blood Sugar - Does not check Checks Blood Sugar _____ times a day  Blood Thinner Instructions:  Warfarin 5 mg.  Ok to hold 5 days per note 05-17-21.  Patient aware Aspirin Instructions: ASA 81 mg to remain on if possible per note   Last Dose:  05-19-21  Activity level:  Can go up a flight of stairs and perform activities of daily living without stopping and without symptoms of chest pain or shortness of breath.       Anesthesia review:  CAD, hx of bypass surgery, complete heart block, Pacemaker insertion. HTN, DM  Patient denies shortness of breath, fever, cough and chest pain at PAT appointment   Patient verbalized understanding of instructions that were given to them at the PAT appointment. Patient was also instructed that they will need to review over the PAT instructions again at home before surgery.

## 2021-05-17 NOTE — Patient Instructions (Addendum)
DUE TO COVID-19 ONLY ONE VISITOR IS ALLOWED TO COME WITH YOU AND STAY IN THE WAITING ROOM ONLY DURING PRE OP AND PROCEDURE.   **NO VISITORS ARE ALLOWED IN THE SHORT STAY AREA OR RECOVERY ROOM!!**        Your procedure is scheduled on:  Friday, 06-03-21   Report to [facility]  Entrance   Report to admitting at 8:00 AM   Call this number if you have problems the morning of surgery [phone]   Do not eat food :After Midnight.   May have liquids until 7:00 AM day of surgery  CLEAR LIQUID DIET  Foods Allowed                                                                     Foods Excluded  Water, Black Coffee and tea, regular and decaf               liquids that you cannot  Plain Jell-O in any flavor  (No red)                                     see through such as: Fruit ices (not with fruit pulp)                                      milk, soups, orange juice              Iced Popsicles (No red)                                      All solid food                                   Apple juices Sports drinks like Gatorade (No red) Lightly seasoned clear broth or consume(fat free) Sugar, honey syrup      Oral Hygiene is also important to reduce your risk of infection.                                    Remember - BRUSH YOUR TEETH THE MORNING OF SURGERY WITH YOUR REGULAR TOOTHPASTE   Do NOT smoke after Midnight   Take these medicines the morning of surgery with A SIP OF WATER:  Carvedilol, Phenergan and Tramadol if needed   Call and check to see if you need to stop Warfarin and ASA for procedure.     How to Manage Your Diabetes Before and After Surgery   WHAT DO I DO ABOUT MY DIABETES MEDICATION?  Do not take oral diabetes medicines (pills) the morning of surgery.  THE DAY BEFORE SURGERY:  Take Metformin as prescribed.       THE MORNING OF SURGERY:  Do not take Metformin   Reviewed and Endorsed by [facility] Patient Education Committee, August 2015  You may not have any metal on your body including jewelry, and body piercing             Do not wear lotions, powders, cologne or deodorant              Men may shave face and neck.   Do not bring valuables to the hospital. [facility] IS NOT  RESPONSIBLE   FOR VALUABLES.   Contacts, dentures or bridgework may not be worn into surgery.    Patients discharged the day of surgery will not be allowed to drive home.  Special Instructions: Bring a copy of your healthcare power of attorney and living will documents         the day of surgery if you haven't scanned them in before.  Please read over the following fact sheets you were given: IF YOU HAVE QUESTIONS ABOUT YOUR PRE OP INSTRUCTIONS PLEASE CALL [phone] [facility] - Preparing for Surgery Before surgery, you can play an important role.  Because skin is not sterile, your skin needs to be as free of germs as possible.  You can reduce the number of germs on your skin by washing with CHG (chlorahexidine gluconate) soap before surgery.  CHG is an antiseptic cleaner which kills germs and bonds with the skin to continue killing germs even after washing. Please DO NOT use if you have an allergy to CHG or antibacterial soaps.  If your skin becomes reddened/irritated stop using the CHG and inform your nurse when you arrive at Short Stay. Do not shave (including legs and underarms) for at least 48 hours prior to the first CHG shower.  You may shave your face/neck.  Please follow these instructions carefully:  1.  Shower with CHG Soap the night before surgery and the  morning of surgery.  2.  If you choose to wash your hair, wash your hair first as usual with your normal  shampoo.  3.  After you shampoo, rinse your hair and body thoroughly to remove the shampoo.                             4.  Use CHG as you would any other liquid soap.  You can apply chg directly to the skin and wash.  Gently with a  scrungie or clean washcloth.  5.  Apply the CHG Soap to your body ONLY FROM THE NECK DOWN.   Do   not use on face/ open                           Wound or open sores. Avoid contact with eyes, ears mouth and   genitals (private parts).                       Wash face,  Genitals (private parts) with your normal soap.             6.  Wash thoroughly, paying special attention to the area where your    surgery  will be performed.  7.  Thoroughly rinse your body with warm water from the neck down.  8.  DO NOT shower/wash with your normal soap after using and rinsing off the CHG Soap.                9.  Pat yourself dry with a clean towel.  10.  Wear clean pajamas.            11.  Place clean sheets on your bed the night of your first shower and do not  sleep with pets. Day of Surgery : Do not apply any lotions/deodorants the morning of surgery.  Please wear clean clothes to the hospital/surgery center.  FAILURE TO FOLLOW THESE INSTRUCTIONS MAY RESULT IN THE CANCELLATION OF YOUR SURGERY  PATIENT SIGNATURE_________________________________  NURSE SIGNATURE__________________________________  ________________________________________________________________________

## 2021-05-17 NOTE — Patient Instructions (Addendum)
Medication Instructions:  No changes  Add colace stool softener if you take norco  If you need a refill on your cardiac medications before your next appointment, please call your pharmacy.    Lab work: No new labs needed   If you have labs (blood work) drawn today and your tests are completely normal, you will receive your results only by: MyChart Message (if you have MyChart) OR A paper copy in the mail If you have any lab test that is abnormal or we need to change your treatment, we will call you to review the results.   Testing/Procedures: No new testing needed   Follow-Up: At [facility], you and your health needs are our priority.  As part of our continuing mission to provide you with exceptional heart care, we have created designated Provider Care Teams.  These Care Teams include your primary Cardiologist (physician) and Advanced Practice Providers (APPs -  Physician Assistants and Nurse Practitioners) who all work together to provide you with the care you need, when you need it.  You will need a follow up appointment in 12 months  Providers on your designated Care Team:   Nicolasa Ducking, NP Eula Listen, PA-C Marisue Ivan, PA-C Cadence Fransico Michael, [place]  Any Other Special Instructions Will Be Listed Below (If Applicable).  COVID-19 Vaccine Information can be found at: [email or web address] For questions related to vaccine distribution or appointments, please email [email or web address] or call [phone].

## 2021-05-18 ENCOUNTER — Ambulatory Visit (INDEPENDENT_AMBULATORY_CARE_PROVIDER_SITE_OTHER): Payer: Medicare Other

## 2021-05-18 ENCOUNTER — Encounter: Payer: Self-pay | Admitting: Internal Medicine

## 2021-05-18 DIAGNOSIS — Z0000 Encounter for general adult medical examination without abnormal findings: Secondary | ICD-10-CM | POA: Diagnosis not present

## 2021-05-18 NOTE — Progress Notes (Signed)
PERIOPERATIVE PRESCRIPTION FOR IMPLANTED CARDIAC DEVICE PROGRAMMING  Patient Information: Name:  Andrew [name]  DOB:  April 05, 1934  MRN:  [id number]    Mallie Mussel, RN  P Cv Div Heartcare Device Planned Procedure: L ureteroscopy, holmium laser and stent exchange  Surgeon: Cordella Register  Date of Procedure: 06-03-21  Cautery will be used.  Position during surgery:  supine   Please send documentation back to:  [facility] (Fax # [phone])   Fransico Him, RN  05/17/2021 4:11 PM  Device Information:  Clinic EP Physician:  Lewayne Bunting, MD   Device Type:  Pacemaker Manufacturer and Phone #:  St. Jude/[employer]ott: [phone] Pacemaker Dependent?:  Yes.   Date of Last Device Check:  03/16/21 Normal Device Function?:  Yes.    Electrophysiologist's Recommendations:  Have magnet available. Provide continuous ECG monitoring when magnet is used or reprogramming is to be performed.  Procedure may interfere with device function.  Magnet should be placed over device during procedure.  Per Device Clinic Standing Orders, Linton Ham, RN  9:10 AM 05/18/2021

## 2021-05-18 NOTE — Patient Instructions (Addendum)
Andrew [name] , Thank you for taking time to come for your Medicare Wellness Visit. I appreciate your ongoing commitment to your health goals. Please review the following plan we discussed and let me know if I can assist you in the future.   Screening recommendations/referrals: Colonoscopy: not a candidate due to age Recommended yearly ophthalmology/optometry visit for glaucoma screening and checkup Recommended yearly dental visit for hygiene and checkup  Vaccinations: Influenza vaccine: 09/06/2020 Pneumococcal vaccine: 09/25/2011, 09/22/2015 Tdap vaccine: 03/26/2013; due every 10 years Shingles vaccine: never done   Covid-19: 12/01/2019, 12/24/2019, 09/03/2020  Advanced directives: Please bring a copy of your health care power of attorney and living will to the office at your convenience.  Conditions/risks identified: Yes; Client understands the importance of follow-up with providers by attending scheduled visits and discussed goals to eat healthier, increase physical activity, exercise the brain, socialize more, get enough sleep and make time for laughter.  Next appointment: Please schedule your next Medicare Wellness Visit with your Nurse Health Advisor in 1 year by calling [phone].  Preventive Care 85 Years and Older, Male Preventive care refers to lifestyle choices and visits with your health care provider that can promote health and wellness. What does preventive care include? A yearly physical exam. This is also called an annual well check. Dental exams once or twice a year. Routine eye exams. Ask your health care provider how often you should have your eyes checked. Personal lifestyle choices, including: Daily care of your teeth and gums. Regular physical activity. Eating a healthy diet. Avoiding tobacco and drug use. Limiting alcohol use. Practicing safe sex. Taking low doses of aspirin every day. Taking vitamin and mineral supplements as recommended by your health care  provider. What happens during an annual well check? The services and screenings done by your health care provider during your annual well check will depend on your age, overall health, lifestyle risk factors, and family history of disease. Counseling  Your health care provider may ask you questions about your: Alcohol use. Tobacco use. Drug use. Emotional well-being. Home and relationship well-being. Sexual activity. Eating habits. History of falls. Memory and ability to understand (cognition). Work and work Astronomer. Screening  You may have the following tests or measurements: Height, weight, and BMI. Blood pressure. Lipid and cholesterol levels. These may be checked every 5 years, or more frequently if you are over 63 years old. Skin check. Lung cancer screening. You may have this screening every year starting at age 85 if you have a 30-pack-year history of smoking and currently smoke or have quit within the past 15 years. Fecal occult blood test (FOBT) of the stool. You may have this test every year starting at age 85. Flexible sigmoidoscopy or colonoscopy. You may have a sigmoidoscopy every 5 years or a colonoscopy every 10 years starting at age 85. Prostate cancer screening. Recommendations will vary depending on your family history and other risks. Hepatitis C blood test. Hepatitis B blood test. Sexually transmitted disease (STD) testing. Diabetes screening. This is done by checking your blood sugar (glucose) after you have not eaten for a while (fasting). You may have this done every 1-3 years. Abdominal aortic aneurysm (AAA) screening. You may need this if you are a current or former smoker. Osteoporosis. You may be screened starting at age 85 if you are at high risk. Talk with your health care provider about your test results, treatment options, and if necessary, the need for more tests. Vaccines  Your health care provider may  recommend certain vaccines, such  as: Influenza vaccine. This is recommended every year. Tetanus, diphtheria, and acellular pertussis (Tdap, Td) vaccine. You may need a Td booster every 10 years. Zoster vaccine. You may need this after age 53. Pneumococcal 13-valent conjugate (PCV13) vaccine. One dose is recommended after age 85. Pneumococcal polysaccharide (PPSV23) vaccine. One dose is recommended after age 85. Talk to your health care provider about which screenings and vaccines you need and how often you need them. This information is not intended to replace advice given to you by your health care provider. Make sure you discuss any questions you have with your health care provider. Document Released: 11/05/2015 Document Revised: 06/28/2016 Document Reviewed: 08/10/2015 Elsevier Interactive Patient Education  2017 [place] Prevention in the Home Falls can cause injuries. They can happen to people of all ages. There are many things you can do to make your home safe and to help prevent falls. What can I do on the outside of my home? Regularly fix the edges of walkways and driveways and fix any cracks. Remove anything that might make you trip as you walk through a door, such as a raised step or threshold. Trim any bushes or trees on the path to your home. Use bright outdoor lighting. Clear any walking paths of anything that might make someone trip, such as rocks or tools. Regularly check to see if handrails are loose or broken. Make sure that both sides of any steps have handrails. Any raised decks and porches should have guardrails on the edges. Have any leaves, snow, or ice cleared regularly. Use sand or salt on walking paths during winter. Clean up any spills in your garage right away. This includes oil or grease spills. What can I do in the bathroom? Use night lights. Install grab bars by the toilet and in the tub and shower. Do not use towel bars as grab bars. Use non-skid mats or decals in the tub or  shower. If you need to sit down in the shower, use a plastic, non-slip stool. Keep the floor dry. Clean up any water that spills on the floor as soon as it happens. Remove soap buildup in the tub or shower regularly. Attach bath mats securely with double-sided non-slip rug tape. Do not have throw rugs and other things on the floor that can make you trip. What can I do in the bedroom? Use night lights. Make sure that you have a light by your bed that is easy to reach. Do not use any sheets or blankets that are too big for your bed. They should not hang down onto the floor. Have a firm chair that has side arms. You can use this for support while you get dressed. Do not have throw rugs and other things on the floor that can make you trip. What can I do in the kitchen? Clean up any spills right away. Avoid walking on wet floors. Keep items that you use a lot in easy-to-reach places. If you need to reach something above you, use a strong step stool that has a grab bar. Keep electrical cords out of the way. Do not use floor polish or wax that makes floors slippery. If you must use wax, use non-skid floor wax. Do not have throw rugs and other things on the floor that can make you trip. What can I do with my stairs? Do not leave any items on the stairs. Make sure that there are handrails on both sides of  the stairs and use them. Fix handrails that are broken or loose. Make sure that handrails are as long as the stairways. Check any carpeting to make sure that it is firmly attached to the stairs. Fix any carpet that is loose or worn. Avoid having throw rugs at the top or bottom of the stairs. If you do have throw rugs, attach them to the floor with carpet tape. Make sure that you have a light switch at the top of the stairs and the bottom of the stairs. If you do not have them, ask someone to add them for you. What else can I do to help prevent falls? Wear shoes that: Do not have high heels. Have  rubber bottoms. Are comfortable and fit you well. Are closed at the toe. Do not wear sandals. If you use a stepladder: Make sure that it is fully opened. Do not climb a closed stepladder. Make sure that both sides of the stepladder are locked into place. Ask someone to hold it for you, if possible. Clearly mark and make sure that you can see: Any grab bars or handrails. First and last steps. Where the edge of each step is. Use tools that help you move around (mobility aids) if they are needed. These include: Canes. Walkers. Scooters. Crutches. Turn on the lights when you go into a dark area. Replace any light bulbs as soon as they burn out. Set up your furniture so you have a clear path. Avoid moving your furniture around. If any of your floors are uneven, fix them. If there are any pets around you, be aware of where they are. Review your medicines with your doctor. Some medicines can make you feel dizzy. This can increase your chance of falling. Ask your doctor what other things that you can do to help prevent falls. This information is not intended to replace advice given to you by your health care provider. Make sure you discuss any questions you have with your health care provider. Document Released: 08/05/2009 Document Revised: 03/16/2016 Document Reviewed: 11/13/2014 Elsevier Interactive Patient Education  2017 [employer].

## 2021-05-18 NOTE — Progress Notes (Signed)
I connected with Sherryl Barters today by telephone and verified that I am speaking with the correct person using two identifiers. Location patient: home Location provider: work Persons participating in the virtual visit: Dreshawn Hendershott and Susie Cassette, LPN.   I discussed the limitations, risks, security and privacy concerns of performing an evaluation and management service by telephone and the availability of in person appointments. I also discussed with the patient that there may be a patient responsible charge related to this service. The patient expressed understanding and verbally consented to this telephonic visit.    Interactive audio and video telecommunications were attempted between this provider and patient, however failed, due to patient having technical difficulties OR patient did not have access to video capability.  We continued and completed visit with audio only.  Some vital signs may be absent or patient reported.   Time Spent with patient on telephone encounter: 30 minutes  Subjective:   Andrew [name] is a 87 y.o. male who presents for Medicare Annual/Subsequent preventive examination.  Review of Systems     Cardiac Risk Factors include: advanced age (>49men, >66 women);diabetes mellitus;dyslipidemia;family history of premature cardiovascular disease;hypertension;male gender     Objective:    Today's Vitals   05/18/21 0921  PainSc: 7    There is no height or weight on file to calculate BMI.  Advanced Directives 05/18/2021 09/27/2017 06/15/2017 09/26/2016 06/19/2013 06/13/2013  Does Patient Have a Medical Advance Directive? Yes Yes Yes Yes - Patient has advance directive, copy not in chart  Type of Advance Directive Living will Healthcare Power of [place];Living will Living will;Healthcare Power of [employer] Power of [place];Living will - Living will;Healthcare Power of Attorney  Does patient want to make changes to medical advance directive? No -  Patient declined - No - Patient declined No - Patient declined - -  Copy of Healthcare Power of Attorney in Chart? - No - copy requested No - copy requested No - copy requested - -  Pre-existing out of facility DNR order (yellow form or pink MOST form) - - - - No -    Current Medications (verified) Outpatient Encounter Medications as of 05/18/2021  Medication Sig   aspirin EC 81 MG tablet Take 81 mg by mouth daily.   atorvastatin (LIPITOR) 20 MG tablet TAKE 1 TABLET DAILY AT     6:00PM (Patient taking differently: Take 20 mg by mouth daily. TAKE 1 TABLET DAILY AT     6:00PM)   carvedilol (COREG) 6.25 MG tablet Take 1 tablet (6.25 mg total) by mouth 2 (two) times daily with a meal.   celecoxib (CELEBREX) 100 MG capsule TAKE 1 CAPSULE DAILY AS    NEEDED (Patient taking differently: Take 100 mg by mouth 2 (two) times daily.)   cholecalciferol (VITAMIN D) 1000 UNITS tablet Take 1,000 Units by mouth daily.   metFORMIN (GLUCOPHAGE-XR) 500 MG 24 hr tablet TAKE 1 TABLET TWICE A DAY (Patient taking differently: Take 500 mg by mouth in the morning and at bedtime.)   Multiple Vitamin (MULTIVITAMIN WITH MINERALS) TABS tablet Take 1 tablet by mouth daily.   niacin (NIASPAN) 1000 MG CR tablet TAKE 1 TABLET AT BEDTIME (Patient taking differently: Take 1,000 mg by mouth at bedtime.)   promethazine (PHENERGAN) 12.5 MG tablet Take 12.5 mg by mouth every 6 (six) hours as needed for nausea or vomiting.   tamsulosin (FLOMAX) 0.4 MG CAPS capsule Take 0.4 mg by mouth daily after supper.   traMADol (ULTRAM) 50 MG tablet Take  1-2 tablets (50-100 mg total) by mouth every 6 (six) hours as needed for moderate pain.   warfarin (JANTOVEN) 5 MG tablet TAKE AS DIRECTED BY THE    [facility] (Patient taking differently: Take 2.5-5 mg by mouth See admin instructions. TAKE AS DIRECTED BY THE [facility]ADIN CLINIC; 5 mg Mon Wed and Fri, and 2.5 mg all other days)   HYDROcodone-acetaminophen (NORCO/VICODIN) 5-325 MG tablet Take 1  tablet by mouth every 6 (six) hours as needed for moderate pain. (Patient not taking: Reported on 05/18/2021)   No facility-administered encounter medications on file as of 05/18/2021.    Allergies (verified) Penicillins   History: Past Medical History:  Diagnosis Date   Colonic polyp 2011 last colo   colo q 57yr - [facility]    Coronary artery disease 02/2009   severe left main and three-vessel, CABG   DEGENERATIVE JOINT DISEASE    Diverticulosis of colon    DIVERTICULOSIS, COLON    HYPERLIPIDEMIA-MIXED    HYPERTENSION, BENIGN    LBP (low back pain)    s/p decompression 8/14; ESI - [place]; RFA x 3 early 2015   Pacemaker    a. 05/2017: St. Jude (serial number [id number]) pacemaker   Symptomatic bradycardia    a. s/p St. Jude (serial number [id number]) pacemaker 06/16/17   Type II or unspecified type diabetes mellitus without mention of complication, not stated as uncontrolled    VITAMIN D DEFICIENCY    Past Surgical History:  Procedure Laterality Date   CARPAL TUNNEL RELEASE Right    CATARACT EXTRACTION, BILATERAL     R 07/07/13, L 07/21/13   CORONARY ARTERY BYPASS GRAFT  03/17/09   x 4   CYSTOSCOPY WITH STENT PLACEMENT Left 05/10/2021   Procedure: CYSTOSCOPY WITH LEFT  STENT PLACEMENT,LEFT RETROGRADE PYELOGRAM;  Surgeon: Crist Fat, MD;  Location: [facility];  Service: Urology;  Laterality: Left;   INGUINAL HERNIA REPAIR  1980   KNEE ARTHROSCOPY  2007   right   LUMBAR EPIDURAL INJECTION  2013   Has as needed.   LUMBAR LAMINECTOMY/DECOMPRESSION MICRODISCECTOMY N/A 06/19/2013   Procedure: LUMBAR LAMINECTOMY/DECOMPRESSION MICRODISCECTOMY;  Surgeon: Emilee Hero, MD;  Location: [facility] OR;  Service: Orthopedics;  Laterality: N/A;  Lumbar 4-5 decompression   PACEMAKER IMPLANT N/A 06/16/2017   Procedure: Pacemaker Implant;  Surgeon: Marinus Maw, MD;  Location: [facility] INVASIVE CV LAB;  Service: Cardiovascular;  Laterality: N/A;   TRIGGER FINGER RELEASE Right    Family History  Problem  Relation Age of Onset   Heart failure Mother        died age 55   Heart failure Father        died age 90   Diabetes Other    Heart disease Neg Hx    Social History   Socioeconomic History   Marital status: Married    Spouse name: Not on file   Number of children: Not on file   Years of education: Not on file   Highest education level: Not on file  Occupational History   Not on file  Tobacco Use   Smoking status: Never   Smokeless tobacco: Never   Tobacco comments:    Married, 3 grown children. 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 used  Substance and Sexual Activity   Alcohol use: Yes    Comment: RARE   Drug use: No   Sexual activity: Not on file  Other Topics Concern   Not on  file  Social History Narrative   Not on file   Social Determinants of Health   Financial Resource Strain: Low Risk    Difficulty of Paying Living Expenses: Not hard at all  Food Insecurity: No Food Insecurity   Worried About Programme researcher, broadcasting/film/video in the Last Year: Never true   Barista in the Last Year: Never true  Transportation Needs: No Transportation Needs   Lack of Transportation (Medical): No   Lack of Transportation (Non-Medical): No  Physical Activity: Sufficiently Active   Days of Exercise per Week: 5 days   Minutes of Exercise per Session: 30 min  Stress: No Stress Concern Present   Feeling of Stress : Not at all  Social Connections: Moderately Integrated   Frequency of Communication with Friends and Family: More than three times a week   Frequency of Social Gatherings with Friends and Family: Once a week   Attends Religious Services: Never   Database administrator or Organizations: Yes   Attends Banker Meetings: 1 to 4 times per year   Marital Status: Married    Tobacco Counseling Counseling given: Not Answered Tobacco comments: Married, 3 grown children. Retired 04/2002 from [employer]  work   Clinical Intake:  Pre-visit preparation completed: Yes  Pain : 0-10 Pain Score: 7  Pain Type: Acute pain Pain Location: Other (Comment) (kidney stone; surgery scheduled for 06/03/2021) Pain Descriptors / Indicators: Discomfort, Tender, Constant Pain Onset: 1 to 4 weeks ago Pain Frequency: Constant Pain Relieving Factors: Tramadol Effect of Pain on Daily Activities: Pain can diminish job performance, lower motivation to exercise, and prevent you from completing daily tasks. Pain produces disability and affects the quality of life.  Pain Relieving Factors: Tramadol  BMI - recorded: 26.3 Nutritional Status: BMI 25 -29 Overweight Nutritional Risks: Nausea/ vomitting/ diarrhea, Unintentional weight loss (loss of appetite due to kidney stone) Diabetes: Yes CBG done?: No Did pt. bring in CBG monitor from home?: No  How often do you need to have someone help you when you read instructions, pamphlets, or other written materials from your doctor or pharmacy?: 1 - Never What is the last grade level you completed in school?: High School Graduate  Diabetic? Yes  Interpreter Needed?: No  Information entered by :: Susie Cassette, LPN   Activities of Daily Living In your present state of health, do you have any difficulty performing the following activities: 05/18/2021 05/10/2021  Hearing? N N  Vision? N N  Difficulty concentrating or making decisions? N N  Walking or climbing stairs? N N  Dressing or bathing? N N  Doing errands, shopping? N -  Preparing Food and eating ? N -  Using the Toilet? N -  In the past six months, have you accidently leaked urine? N -  Do you have problems with loss of bowel control? N -  Managing your Medications? N -  Managing your Finances? N -  Housekeeping or managing your Housekeeping? N -  Some recent data might be hidden    Patient Care Team: Pincus Sanes, MD as PCP - General (Internal Medicine) Lunette Stands, MD as Consulting Physician  (Orthopedic Surgery) Loreta Ave, MD (Inactive) as Consulting Physician (Orthopedic Surgery) Antonieta Iba, MD as Consulting Physician (Cardiology) Estill Bamberg, MD (Orthopedic Surgery) Hanley Seamen Dustin Folks, MD (Optometry) Carman Ching, MD (Inactive) (Gastroenterology) Donzetta Starch, MD as Consulting Physician (Dermatology)  Indicate any recent Medical Services you may have received from other than  [facility] providers in the past year (date may be approximate).     Assessment:   This is a routine wellness examination for Colemanraig.  Hearing/Vision screen Hearing Screening - Comments:: Patient wears hearing aids. Vision Screening - Comments:: Patient wears glasses.  Eye exam done once a year by Dr. Wynona LunaSteven Bernstorf.  Dietary issues and exercise activities discussed: Current Exercise Habits: The patient does not participate in regular exercise at present;Home exercise routine, Type of exercise: walking;Other - see comments (yard work, gardening), Time (Minutes): 30, Frequency (Times/Week): 5, Weekly Exercise (Minutes/Week): 150, Intensity: Moderate, Exercise limited by: None identified   Goals Addressed   None   Depression Screen PHQ 2/9 Scores 05/18/2021 04/01/2021 10/01/2019 04/01/2019 09/30/2018 09/27/2017 09/27/2017  PHQ - 2 Score 1 0 0 0 0 0 0    Fall Risk Fall Risk  05/18/2021 04/01/2021 10/01/2019 04/01/2019 09/30/2018  Falls in the past year? 0 0 0 0 0  Number falls in past yr: 0 0 0 0 0  Injury with Fall? 0 0 - - -  Risk for fall due to : No Fall Risks No Fall Risks - - -  Follow up Falls evaluation completed Falls evaluation completed - - -    FALL RISK PREVENTION PERTAINING TO THE HOME:  Any stairs in or around the home? Yes  If so, are there any without handrails? No  Home free of loose throw rugs in walkways, pet beds, electrical cords, etc? Yes  Adequate lighting in your home to reduce risk of falls? Yes   ASSISTIVE DEVICES UTILIZED TO PREVENT FALLS:  Life alert? No  Use  of a cane, walker or w/c? No  Grab bars in the bathroom? Yes  Shower chair or bench in shower? No  Elevated toilet seat or a handicapped toilet? Yes   TIMED UP AND GO:  Was the test performed? No .  Length of time to ambulate 10 feet: 0 sec.   Gait steady and fast without use of assistive device (per patient)  Cognitive Function: Normal cognitive status assessed by direct observation by this Nurse Health Advisor. No abnormalities found.   MMSE - Mini Mental State Exam 09/27/2017  Orientation to time 5  Orientation to Place 5  Registration 3  Attention/ Calculation 5  Recall 2  Language- name 2 objects 2  Language- repeat 1  Language- follow 3 step command 3  Language- read & follow direction 1  Write a sentence 1  Copy design 1  Total score 29        Immunizations Immunization History  Administered Date(s) Administered   Influenza Split 09/26/2012   Influenza, High Dose Seasonal PF 07/30/2018, 07/02/2019, 09/06/2020   Influenza, Seasonal, Injecte, Preservative Fre 07/23/2013   Influenza-Unspecified 09/24/2014, 07/24/2015, 08/04/2016, 08/13/2017   PFIZER(Purple Top)SARS-COV-2 Vaccination 12/01/2019, 12/24/2019, 09/06/2020   Pneumococcal Conjugate-13 09/22/2015   Pneumococcal-Unspecified 09/25/2011   Td 11/07/2002, 03/26/2013    TDAP status: Up to date  Flu Vaccine status: Up to date  Pneumococcal vaccine status: Up to date  Covid-19 vaccine status: Completed vaccines  Qualifies for Shingles Vaccine? Yes   Zostavax completed No   Shingrix Completed?: No.    Education has been provided regarding the importance of this vaccine. Patient has been advised to call insurance company to determine out of pocket expense if they have not yet received this vaccine. Advised may also receive vaccine at local pharmacy or Health Dept. Verbalized acceptance and understanding.  Screening Tests Health Maintenance  Topic Date Due  Zoster Vaccines- Shingrix (1 of 2) Never done    OPHTHALMOLOGY EXAM  09/11/2018   FOOT EXAM  07/06/2019   COLONOSCOPY (Pts 45-3yrs Insurance coverage will need to be confirmed)  10/20/2020   COVID-19 Vaccine (4 - Booster for Pfizer series) 01/04/2021   INFLUENZA VACCINE  05/23/2021   URINE MICROALBUMIN  09/29/2021   TETANUS/TDAP  03/27/2023   PNA vac Low Risk Adult  Completed   HPV VACCINES  Aged Out    Health Maintenance  Health Maintenance Due  Topic Date Due   Zoster Vaccines- Shingrix (1 of 2) Never done   OPHTHALMOLOGY EXAM  09/11/2018   FOOT EXAM  07/06/2019   COLONOSCOPY (Pts 45-15yrs Insurance coverage will need to be confirmed)  10/20/2020   COVID-19 Vaccine (4 - Booster for Pfizer series) 01/04/2021    Colorectal cancer screening: No longer required.   Lung Cancer Screening: (Low Dose CT Chest recommended if Age 67-80 years, 30 pack-year currently smoking OR have quit w/in 15years.) does not qualify.   Lung Cancer Screening Referral: no  Additional Screening:  Hepatitis C Screening: does not qualify; Completed no  Vision Screening: Recommended annual ophthalmology exams for early detection of glaucoma and other disorders of the eye. Is the patient up to date with their annual eye exam?  Yes  Who is the provider or what is the name of the office in which the patient attends annual eye exams? Dr. Wynona Luna If pt is not established with a provider, would they like to be referred to a provider to establish care? No .   Dental Screening: Recommended annual dental exams for proper oral hygiene  Community Resource Referral / Chronic Care Management: CRR required this visit?  No   CCM required this visit?  No      Plan:     I have personally reviewed and noted the following in the patient's chart:   Medical and social history Use of alcohol, tobacco or illicit drugs  Current medications and supplements including opioid prescriptions. Patient is not currently taking opioid prescriptions. Functional  ability and status Nutritional status Physical activity Advanced directives List of other physicians Hospitalizations, surgeries, and ER visits in previous 12 months Vitals Screenings to include cognitive, depression, and falls Referrals and appointments  In addition, I have reviewed and discussed with patient certain preventive protocols, quality metrics, and best practice recommendations. A written personalized care plan for preventive services as well as general preventive health recommendations were provided to patient.     Mickeal Needy, LPN   1/61/0960   Nurse Notes:  Patient is cogitatively intact. There were no vitals filed for this visit. There is no height or weight on file to calculate BMI. Patient stated that he has no issues with gait or balance; does not use any assistive devices.

## 2021-05-19 ENCOUNTER — Encounter (HOSPITAL_COMMUNITY)
Admission: RE | Admit: 2021-05-19 | Discharge: 2021-05-19 | Disposition: A | Discharge status: Home / Self Care | Payer: Medicare Other | Source: Ambulatory Visit | Attending: Urology | Admitting: Urology

## 2021-05-19 ENCOUNTER — Encounter (HOSPITAL_COMMUNITY): Payer: Self-pay

## 2021-05-19 ENCOUNTER — Other Ambulatory Visit: Payer: Self-pay

## 2021-05-19 DIAGNOSIS — Z01812 Encounter for preprocedural laboratory examination: Secondary | ICD-10-CM | POA: Diagnosis not present

## 2021-05-19 HISTORY — DX: Personal history of urinary calculi: Z87.442

## 2021-05-19 LAB — GLUCOSE, CAPILLARY: Glucose-Capillary: 133 mg/dL — ABNORMAL HIGH (ref 70–99)

## 2021-05-20 LAB — HEMOGLOBIN A1C
Hgb A1c MFr Bld: 7.3 % — ABNORMAL HIGH (ref 4.8–5.6)
Mean Plasma Glucose: 163 mg/dL

## 2021-05-25 NOTE — Anesthesia Preprocedure Evaluation (Addendum)
Anesthesia Evaluation  Patient identified by MRN, date of birth, ID band Patient awake    Reviewed: Allergy & Precautions, NPO status , Patient's Chart, lab work & pertinent test results, reviewed documented beta blocker date and time   Airway Mallampati: II  TM Distance: >3 FB Neck ROM: Full    Dental  (+) Dental Advisory Given, Partial Upper   Pulmonary neg pulmonary ROS,    Pulmonary exam normal breath sounds clear to auscultation       Cardiovascular hypertension, Pt. on home beta blockers + CAD and + CABG  Normal cardiovascular exam+ dysrhythmias + pacemaker  Rhythm:Regular Rate:Normal     Neuro/Psych negative neurological ROS     GI/Hepatic negative GI ROS, Neg liver ROS,   Endo/Other  diabetes, Type 2, Oral Hypoglycemic Agents  Renal/GU Renal InsufficiencyRenal disease     Musculoskeletal  (+) Arthritis ,   Abdominal   Peds  Hematology  (+) Blood dyscrasia (Warfarin), ,   Anesthesia Other Findings Day of surgery medications reviewed with the patient.  Reproductive/Obstetrics                            Anesthesia Physical Anesthesia Plan  ASA: 3  Anesthesia Plan: General   Post-op Pain Management:    Induction: Intravenous  PONV Risk Score and Plan: 3 and Dexamethasone, Ondansetron and Treatment may vary due to age or medical condition  Airway Management Planned: LMA  Additional Equipment:   Intra-op Plan:   Post-operative Plan: Extubation in OR  Informed Consent: I have reviewed the patients History and Physical, chart, labs and discussed the procedure including the risks, benefits and alternatives for the proposed anesthesia with the patient or authorized representative who has indicated his/her understanding and acceptance.     Dental advisory given  Plan Discussed with: CRNA  Anesthesia Plan Comments: (See PAT note 05/19/2021, Jodell Cipro, PA-C)        Anesthesia Quick Evaluation

## 2021-05-25 NOTE — Progress Notes (Signed)
Anesthesia Chart Review   Case: [id number] Date/Time: 06/03/21 0945   Procedure: LEFT URETEROSCOPY/HOLMIUM LASER/STENT EXCHANGE (Left)   Anesthesia type: General   Pre-op diagnosis: left ureteral stone   [facility]   Surgeons: Crist Fat, MD       DISCUSSION:87 y.o. never smoker with h/o HTN, DM II (A1C 7.3 05/19/2021), CAD (CABG 02/2009), pacemaker in place due to symptomatic bradycardia (device orders in progress note 05/18/2021), prior DVT (on Warfarin), left ureteral stone scheduled for above procedure 06/03/2021 with Dr. Berniece Salines.   Pt last seen by cardiology 05/17/2021. Per OV note, "Clear from cardiac perspective No further testing needed Hold warfarin at least 5 days prior to procedure If possible stay on asa 81 mg daily given CAD/hx of CABG -DVT prophylaxis with SCIDS/teds when off warfarin"  Anticipate pt can proceed with planned procedure barring acute status change.   VS: BP 124/71   Pulse 67   Temp 36.6 C (Oral)   Resp 18   Ht 5\' 8"  (1.727 m)   Wt 76.8 kg   SpO2 99%   BMI 25.76 kg/m   PROVIDERS: , MD is PCP   Pincus Sanes, MD is Cardiologist  LABS: Labs reviewed: Acceptable for surgery. (all labs ordered are listed, but only abnormal results are displayed)  Labs Reviewed  HEMOGLOBIN A1C - Abnormal; Notable for the following components:      Result Value   Hgb A1c MFr Bld 7.3 (*)    All other components within normal limits  GLUCOSE, CAPILLARY - Abnormal; Notable for the following components:   Glucose-Capillary 133 (*)    All other components within normal limits     IMAGES:   EKG: 05/17/2021 Rate 77 bpm  Atrial-sensed ventricular-paced rhythm with occasional PVCs  CV: Stress Test 12/27/2016 Pharmacological myocardial perfusion imaging study with no significant  Ischemia. Normal wall motion, EF estimated at 49% (Small region of GI uptake artifact noted) No EKG changes concerning for ischemia  at peak stress or in recovery. Resting EKG with right bundle branch block Low risk scan    Echo 11/30/2009  - Left ventricle: Poor image quality. Unable to asses RWMA's. EF     appears mildly decreased with possible septal hypokinesis The     cavity size was normal. Wall thickness was increased in a pattern     of mild LVH.   - Atrial septum: No defect or patent foramen ovale was identified. Past Medical History:  Diagnosis Date   Colonic polyp 2011 last colo   colo q 77yr - [facility]    Coronary artery disease 02/2009   severe left main and three-vessel, CABG   DEGENERATIVE JOINT DISEASE    Diverticulosis of colon    DIVERTICULOSIS, COLON    History of kidney stones    HYPERLIPIDEMIA-MIXED    HYPERTENSION, BENIGN    LBP (low back pain)    s/p decompression 8/14; ESI - [place]; RFA x 3 early 2015   Pacemaker    a. 05/2017: St. Jude (serial number [id number]) pacemaker   Symptomatic bradycardia    a. s/p St. Jude (serial number [id number]) pacemaker 06/16/17   Type II or unspecified type diabetes mellitus without mention of complication, not stated as uncontrolled    VITAMIN D DEFICIENCY     Past Surgical History:  Procedure Laterality Date   CARPAL TUNNEL RELEASE Right    CATARACT EXTRACTION, BILATERAL     R 07/07/13, L 07/21/13   CORONARY  ARTERY BYPASS GRAFT  03/17/09   x 4   CYSTOSCOPY WITH STENT PLACEMENT Left 05/10/2021   Procedure: CYSTOSCOPY WITH LEFT  STENT PLACEMENT,LEFT RETROGRADE PYELOGRAM;  Surgeon: Crist Fat, MD;  Location: [facility];  Service: Urology;  Laterality: Left;   INGUINAL HERNIA REPAIR  1980   KNEE ARTHROSCOPY  2007   right   LUMBAR EPIDURAL INJECTION  2013   Has as needed.   LUMBAR LAMINECTOMY/DECOMPRESSION MICRODISCECTOMY N/A 06/19/2013   Procedure: LUMBAR LAMINECTOMY/DECOMPRESSION MICRODISCECTOMY;  Surgeon: Emilee Hero, MD;  Location: [facility] OR;  Service: Orthopedics;  Laterality: N/A;  Lumbar 4-5 decompression   PACEMAKER IMPLANT N/A 06/16/2017    Procedure: Pacemaker Implant;  Surgeon: Marinus Maw, MD;  Location: [facility] INVASIVE CV LAB;  Service: Cardiovascular;  Laterality: N/A;   TRIGGER FINGER RELEASE Right     MEDICATIONS:  aspirin EC 81 MG tablet   atorvastatin (LIPITOR) 20 MG tablet   carvedilol (COREG) 6.25 MG tablet   celecoxib (CELEBREX) 100 MG capsule   cholecalciferol (VITAMIN D) 1000 UNITS tablet   HYDROcodone-acetaminophen (NORCO/VICODIN) 5-325 MG tablet   metFORMIN (GLUCOPHAGE-XR) 500 MG 24 hr tablet   Multiple Vitamin (MULTIVITAMIN WITH MINERALS) TABS tablet   niacin (NIASPAN) 1000 MG CR tablet   promethazine (PHENERGAN) 12.5 MG tablet   tamsulosin (FLOMAX) 0.4 MG CAPS capsule   traMADol (ULTRAM) 50 MG tablet   warfarin (JANTOVEN) 5 MG tablet   No current facility-administered medications for this encounter.     Jodell Cipro, PA-C [facility]Surgical Testing [phone]

## 2021-06-03 ENCOUNTER — Encounter (HOSPITAL_COMMUNITY): Payer: Self-pay | Admitting: Urology

## 2021-06-03 ENCOUNTER — Ambulatory Visit (HOSPITAL_COMMUNITY)
Admission: RE | Admit: 2021-06-03 | Discharge: 2021-06-03 | Disposition: A | Discharge status: Home / Self Care | Payer: Medicare Other | Attending: Urology | Admitting: Urology

## 2021-06-03 ENCOUNTER — Ambulatory Visit (HOSPITAL_COMMUNITY): Payer: Medicare Other

## 2021-06-03 ENCOUNTER — Ambulatory Visit (HOSPITAL_COMMUNITY): Payer: Medicare Other | Admitting: Anesthesiology

## 2021-06-03 ENCOUNTER — Encounter (HOSPITAL_COMMUNITY)
Admission: RE | Disposition: A | Discharge status: Home / Self Care | Payer: Self-pay | Source: Home / Self Care | Attending: Urology

## 2021-06-03 ENCOUNTER — Ambulatory Visit (HOSPITAL_COMMUNITY): Payer: Medicare Other | Admitting: Physician Assistant

## 2021-06-03 DIAGNOSIS — Z7984 Long term (current) use of oral hypoglycemic drugs: Secondary | ICD-10-CM | POA: Insufficient documentation

## 2021-06-03 DIAGNOSIS — Z7901 Long term (current) use of anticoagulants: Secondary | ICD-10-CM | POA: Diagnosis not present

## 2021-06-03 DIAGNOSIS — N132 Hydronephrosis with renal and ureteral calculous obstruction: Secondary | ICD-10-CM | POA: Insufficient documentation

## 2021-06-03 DIAGNOSIS — Z791 Long term (current) use of non-steroidal anti-inflammatories (NSAID): Secondary | ICD-10-CM | POA: Insufficient documentation

## 2021-06-03 DIAGNOSIS — Z7982 Long term (current) use of aspirin: Secondary | ICD-10-CM | POA: Insufficient documentation

## 2021-06-03 DIAGNOSIS — N200 Calculus of kidney: Secondary | ICD-10-CM | POA: Diagnosis present

## 2021-06-03 DIAGNOSIS — Z880 Allergy status to penicillin: Secondary | ICD-10-CM | POA: Insufficient documentation

## 2021-06-03 DIAGNOSIS — Z79891 Long term (current) use of opiate analgesic: Secondary | ICD-10-CM | POA: Insufficient documentation

## 2021-06-03 HISTORY — PX: CYSTOSCOPY/URETEROSCOPY/HOLMIUM LASER/STENT PLACEMENT: SHX6546

## 2021-06-03 LAB — PROTIME-INR
INR: 1 (ref 0.8–1.2)
Prothrombin Time: 13.6 seconds (ref 11.4–15.2)

## 2021-06-03 LAB — GLUCOSE, CAPILLARY: Glucose-Capillary: 140 mg/dL — ABNORMAL HIGH (ref 70–99)

## 2021-06-03 LAB — APTT: aPTT: 29 seconds (ref 24–36)

## 2021-06-03 SURGERY — CYSTOSCOPY/URETEROSCOPY/HOLMIUM LASER/STENT PLACEMENT
Anesthesia: General | Laterality: Left

## 2021-06-03 MED ORDER — LIDOCAINE 2% (20 MG/ML) 5 ML SYRINGE
INTRAMUSCULAR | Status: DC | PRN
  Administered 2021-06-03: 80 mg via INTRAVENOUS

## 2021-06-03 MED ORDER — IOHEXOL 300 MG/ML  SOLN
INTRAMUSCULAR | Status: DC | PRN
Start: 2021-06-03 — End: 2021-06-03
  Administered 2021-06-03: 20 mL

## 2021-06-03 MED ORDER — FENTANYL CITRATE (PF) 100 MCG/2ML IJ SOLN
INTRAMUSCULAR | Status: AC
  Filled 2021-06-03: qty 2

## 2021-06-03 MED ORDER — ORAL CARE MOUTH RINSE: 15.0000 mL | Freq: Once | OROMUCOSAL | Status: AC

## 2021-06-03 MED ORDER — PROPOFOL 10 MG/ML IV BOLUS
INTRAVENOUS | Status: DC | PRN
Start: 2021-06-03 — End: 2021-06-03
  Administered 2021-06-03: 120 mg via INTRAVENOUS

## 2021-06-03 MED ORDER — CIPROFLOXACIN IN D5W 400 MG/200ML IV SOLN
400.0000 mg | INTRAVENOUS | Status: AC
  Administered 2021-06-03: 400 mg via INTRAVENOUS

## 2021-06-03 MED ORDER — ONDANSETRON HCL 4 MG/2ML IJ SOLN
INTRAMUSCULAR | Status: AC
  Filled 2021-06-03: qty 2

## 2021-06-03 MED ORDER — FENTANYL CITRATE (PF) 100 MCG/2ML IJ SOLN
INTRAMUSCULAR | Status: DC | PRN
  Administered 2021-06-03 (×4): 25 ug via INTRAVENOUS

## 2021-06-03 MED ORDER — LACTATED RINGERS IV SOLN: INTRAVENOUS | Status: DC

## 2021-06-03 MED ORDER — DEXAMETHASONE SODIUM PHOSPHATE 10 MG/ML IJ SOLN
INTRAMUSCULAR | Status: DC | PRN
  Administered 2021-06-03: 10 mg via INTRAVENOUS

## 2021-06-03 MED ORDER — ONDANSETRON HCL 4 MG/2ML IJ SOLN
INTRAMUSCULAR | Status: DC | PRN
  Administered 2021-06-03: 4 mg via INTRAVENOUS

## 2021-06-03 MED ORDER — DEXAMETHASONE SODIUM PHOSPHATE 10 MG/ML IJ SOLN
INTRAMUSCULAR | Status: AC
  Filled 2021-06-03: qty 1

## 2021-06-03 MED ORDER — EPHEDRINE SULFATE-NACL 50-0.9 MG/10ML-% IV SOSY
PREFILLED_SYRINGE | INTRAVENOUS | Status: DC | PRN
  Administered 2021-06-03 (×2): 10 mg via INTRAVENOUS
  Administered 2021-06-03: 5 mg via INTRAVENOUS

## 2021-06-03 MED ORDER — CIPROFLOXACIN HCL 500 MG PO TABS: 500.0000 mg | ORAL_TABLET | Freq: Once | ORAL | 0 refills | Status: AC

## 2021-06-03 MED ORDER — CHLORHEXIDINE GLUCONATE 0.12 % MT SOLN
15.0000 mL | Freq: Once | OROMUCOSAL | Status: AC
  Administered 2021-06-03: 15 mL via OROMUCOSAL

## 2021-06-03 MED ORDER — CIPROFLOXACIN IN D5W 400 MG/200ML IV SOLN
INTRAVENOUS | Status: AC
  Filled 2021-06-03: qty 200

## 2021-06-03 MED ORDER — PROPOFOL 10 MG/ML IV BOLUS
INTRAVENOUS | Status: AC
  Filled 2021-06-03: qty 20

## 2021-06-03 MED ORDER — SODIUM CHLORIDE 0.9 % IR SOLN
Status: DC | PRN
  Administered 2021-06-03: 6000 mL

## 2021-06-03 MED ORDER — FENTANYL CITRATE (PF) 100 MCG/2ML IJ SOLN: 25.0000 ug | INTRAMUSCULAR | Status: DC | PRN

## 2021-06-03 MED ORDER — 0.9 % SODIUM CHLORIDE (POUR BTL) OPTIME
TOPICAL | Status: DC | PRN
  Administered 2021-06-03: 1000 mL

## 2021-06-03 MED ORDER — LIDOCAINE 2% (20 MG/ML) 5 ML SYRINGE
INTRAMUSCULAR | Status: AC
  Filled 2021-06-03: qty 5

## 2021-06-03 MED ORDER — PHENAZOPYRIDINE HCL 200 MG PO TABS
200.0000 mg | ORAL_TABLET | Freq: Three times a day (TID) | ORAL | 0 refills | Status: DC | PRN
Start: 2021-06-03 — End: 2021-08-17

## 2021-06-03 MED ORDER — EPHEDRINE 5 MG/ML INJ
INTRAVENOUS | Status: AC
  Filled 2021-06-03: qty 5

## 2021-06-03 MED ORDER — ONDANSETRON HCL 4 MG/2ML IJ SOLN: 4.0000 mg | Freq: Once | INTRAMUSCULAR | Status: DC | PRN

## 2021-06-03 SURGICAL SUPPLY — 21 items
BAG URO CATCHER STRL LF (MISCELLANEOUS) ×3 IMPLANT
BASKET ZERO TIP NITINOL 2.4FR (BASKET) IMPLANT
CATH URET 5FR 28IN OPEN ENDED (CATHETERS) ×3 IMPLANT
CLOTH BEACON ORANGE TIMEOUT ST (SAFETY) ×3 IMPLANT
DRSG TEGADERM 2-3/8X2-3/4 SM (GAUZE/BANDAGES/DRESSINGS) ×6 IMPLANT
EXTRACTOR STONE 1.7FRX115CM (UROLOGICAL SUPPLIES) IMPLANT
EXTRACTOR STONE NITINOL NGAGE (UROLOGICAL SUPPLIES) ×3 IMPLANT
FIBER LASER MOSES 365 DFL (Laser) ×3 IMPLANT
GLOVE SURG ENC TEXT LTX SZ7.5 (GLOVE) ×3 IMPLANT
GOWN STRL REUS W/TWL XL LVL3 (GOWN DISPOSABLE) ×3 IMPLANT
GUIDEWIRE ANG ZIPWIRE 038X150 (WIRE) IMPLANT
GUIDEWIRE STR DUAL SENSOR (WIRE) ×3 IMPLANT
KIT TURNOVER KIT A (KITS) ×3 IMPLANT
MANIFOLD NEPTUNE II (INSTRUMENTS) ×3 IMPLANT
PACK CYSTO (CUSTOM PROCEDURE TRAY) ×3 IMPLANT
SHEATH URETERAL 12FRX28CM (UROLOGICAL SUPPLIES) IMPLANT
SHEATH URETERAL 12FRX35CM (MISCELLANEOUS) IMPLANT
STENT URET 6FRX24 CONTOUR (STENTS) ×3 IMPLANT
TUBING CONNECTING 10 (TUBING) ×2 IMPLANT
TUBING CONNECTING 10' (TUBING) ×1
TUBING UROLOGY SET (TUBING) ×3 IMPLANT

## 2021-06-03 NOTE — Interval H&P Note (Signed)
History and Physical Interval Note:  06/03/2021 7:53 AM  Andrew [name]  has presented today for surgery, with the diagnosis of left ureteral stone.  The various methods of treatment have been discussed with the patient and family. After consideration of risks, benefits and other options for treatment, the patient has consented to  Procedure(s): LEFT URETEROSCOPY/HOLMIUM LASER/STENT EXCHANGE (Left) as a surgical intervention.  The patient's history has been reviewed, patient examined, no change in status, stable for surgery.  I have reviewed the patient's chart and labs.  Questions were answered to the patient's satisfaction.     Crist Fat

## 2021-06-03 NOTE — Discharge Instructions (Signed)
DISCHARGE INSTRUCTIONS FOR KIDNEY STONE/URETERAL STENT   MEDICATIONS:  1.  Resume all your other meds from home - except do not take any extra narcotic pain meds that you may have at home.  2. Pyridium is to help with the burning/stinging when you urinate. 3. Tramadol is for moderate/severe pain, otherwise taking upto 1000 mg every 6 hours of plainTylenol will help treat your pain.   4. Take Cipro one hour prior to removal of your stent.   ACTIVITY:  1. No strenuous activity x 1week  2. No driving while on narcotic pain medications  3. Drink plenty of water  4. Continue to walk at home - you can still get blood clots when you are at home, so keep active, but don't over do it.  5. May return to work/school tomorrow or when you feel ready   BATHING:  1. You can shower and we recommend daily showers  2. You have a string coming from your urethra: The stent string is attached to your ureteral stent. Do not pull on this.   SIGNS/SYMPTOMS TO CALL:  Please call us if you have a fever greater than 101.5, uncontrolled nausea/vomiting, uncontrolled pain, dizziness, unable to urinate, bloody urine, chest pain, shortness of breath, leg swelling, leg pain, redness around wound, drainage from wound, or any other concerns or questions.   You can reach [place] at [phone].   FOLLOW-UP:  1. You have an appointment in 6 weeks with a ultrasound of your kidneys prior. 2. You have a string attached to your stent, you may remove it on 06/06/21. To do this, pull the strings until the stents are completely removed. You may feel an odd sensation in your back.

## 2021-06-03 NOTE — H&P (Signed)
Acute Kidney Stone  HPI: Andrew [name] is a 87 year-old male patient who is here for further eval and management of kidney stones.  He was diagnosed with a kidney stone on approximately 05/06/2021.   His pain started about approximately 05/05/2021. The pain is on the left side.   Abdomen/Pelvic CT: CT - 7/15, 43mm left proximal stone. The patient underwent No imaging prior to today's appointment.   The patient relates initially having nausea, vomiting, flank pain, and groin pain. He is currently having flank pain, groin pain, and nausea. He denies having back pain, vomiting, fever, chills, and voiding symptoms. He has not caught a stone in his urine strainer since his symptoms began.   He has never had surgical treatment for calculi in the past. This is his first kidney stone.   The patient is on Coumadin and aspirin for atrial fibrillation.     ALLERGIES: Penicillin    MEDICATIONS: Metformin Hcl  Vicodin  Warfarin Sodium 5 mg tablet  Aspirin Ec 81 mg tablet, delayed release  Celebrex  Coreg  Multivitamin  Niacin  Vitamin D3     GU PSH: No GU PSH      PSH Notes: pacemaker insertion  quadruple bypass 1010  back surgery   NON-GU PSH: No Non-GU PSH    GU PMH: None   NON-GU PMH: Arthritis Diabetes Type 2 DVT, History Hypercholesterolemia    FAMILY HISTORY: 1 Daughter - Daughter 2 sons - Son   SOCIAL HISTORY: Marital Status: Married Preferred Language: English; Ethnicity: Not Hispanic Or Latino; Race: White Current Smoking Status: Patient has never smoked.   Tobacco Use Assessment Completed: Used Tobacco in last 30 days? Has never drank.  Does not drink caffeine.    REVIEW OF SYSTEMS:    GU Review Male:   Patient reports get up at night to urinate. Patient denies frequent urination, hard to postpone urination, burning/ pain with urination, leakage of urine, stream starts and stops, trouble starting your stream, have to strain to urinate , erection problems, and  penile pain.  Gastrointestinal (Upper):   Patient denies nausea, vomiting, and indigestion/ heartburn.  Gastrointestinal (Lower):   Patient denies diarrhea and constipation.  Constitutional:   Patient denies fever, night sweats, weight loss, and fatigue.  Skin:   Patient denies skin rash/ lesion and itching.  Eyes:   Patient denies blurred vision and double vision.  Ears/ Nose/ Throat:   Patient denies sore throat and sinus problems.  Hematologic/Lymphatic:   Patient denies swollen glands and easy bruising.  Cardiovascular:   Patient denies leg swelling and chest pains.  Respiratory:   Patient denies cough and shortness of breath.  Endocrine:   Patient denies excessive thirst.  Musculoskeletal:   Patient denies back pain and joint pain.  Neurological:   Patient denies headaches and dizziness.  Psychologic:   Patient denies depression and anxiety.   Notes: blood In urine    VITAL SIGNS:      05/10/2021 10:54 AM  Weight 10 lb / 4.54 kg  Height 68 in / 172.72 cm  BP 89/55 mmHg  Pulse 89 /min  Temperature 96.9 F / 36.0 C  BMI 1.5 kg/m   MULTI-SYSTEM PHYSICAL EXAMINATION:    Constitutional: Well-nourished. No physical deformities. Normally developed. Good grooming.  Neck: Neck symmetrical, not swollen. Normal tracheal position.  Respiratory: Normal breath sounds. No labored breathing, no use of accessory muscles.   Cardiovascular: Regular rate and rhythm. No murmur, no gallop. Normal temperature, normal extremity pulses, no  swelling, no varicosities.   Lymphatic: No enlargement of neck, axillae, groin.  Skin: No paleness, no jaundice, no cyanosis. No lesion, no ulcer, no rash.  Neurologic / Psychiatric: Oriented to time, oriented to place, oriented to person. No depression, no anxiety, no agitation.  Gastrointestinal: No mass, no tenderness, no rigidity, non obese abdomen.  Eyes: Normal conjunctivae. Normal eyelids.  Ears, Nose, Mouth, and Throat: Left ear no scars, no lesions, no  masses. Right ear no scars, no lesions, no masses. Nose no scars, no lesions, no masses. Normal hearing. Normal lips.  Musculoskeletal: Normal gait and station of head and neck.     Complexity of Data:  Source Of History:  Patient  Records Review:   Previous Doctor Records, Previous Hospital Records, Previous Patient Records  Urine Test Review:   Urinalysis  X-Ray Review: C.T. Abdomen/Pelvis: Reviewed Films. Discussed With Patient.     PROCEDURES:         KUB - [id number]  A single view of the abdomen is obtained. Renal shadows are easily visualized bilaterally. There are no stones appreciated within the expected location in either renal pelvis. There are no additional calcifications along the expected location of either ureter bilaterally.  Gas pattern is grossly normal. No significant bony abnormalities.      . Patient confirmed No Neulasta OnPro Device.           Urinalysis w/Scope - 81001 Dipstick Dipstick Cont'd Micro  Color: Yellow Bilirubin: Neg WBC/hpf: NS (Not Seen)  Appearance: Clear Ketones: Trace RBC/hpf: 0 - 2/hpf  Specific Gravity: 1.025 Blood: 1+ Bacteria: NS (Not Seen)  pH: <=5.0 Protein: Neg Cystals: NS (Not Seen)  Glucose: Neg Urobilinogen: 0.2 Casts: NS (Not Seen)    Nitrites: Neg Trichomonas: Not Present    Leukocyte Esterase: Neg Mucous: Not Present      Epithelial Cells: 0 - 5/hpf      Yeast: NS (Not Seen)      Sperm: Not Present    Notes:  microscopic not concentrated           Ketoralac 30mg  - , [id number] Qty: 30 Adm. By: [id number]  Unit: mg Lot No [place] Nunnery  Route: IM Exp. Date 09/21/2022  Freq: None Mfgr.: [id number]  Site: Left Buttock   ASSESSMENT:      ICD-10 Details  1 GU:   Ureteral calculus - N20.1    PLAN:           Orders X-Rays: KUB          Schedule Return Visit/Planned Activity: ASAP - Schedule Surgery          Document Letter(s):  Created for Patient: Clinical Summary    The patient presents today with an  infected stone or very poorly controlled pain. Given their circumstance I recommended that we proceed urgently to the OR today for stent placement. This will allow the kidney to drain along with any associated infection and will help control their pain. They understand that the stent is temporary and the patient will require an additional procedure to treat the stone. I explained the procedure with the patient and described the expected outcome. I also explained to them the stent discomfort and potential for worsening voiding symptoms. We will see how the do before, during and after the procedure and decide whether the patient will require admission to the hospital.

## 2021-06-03 NOTE — Anesthesia Procedure Notes (Signed)
Procedure Name: LMA Insertion Date/Time: 06/03/2021 8:05 AM Performed by: Florene Route, CRNA Patient Re-evaluated:Patient Re-evaluated prior to induction Oxygen Delivery Method: Circle system utilized Preoxygenation: Pre-oxygenation with 100% oxygen Induction Type: IV induction LMA: LMA inserted LMA Size: 4.0 Number of attempts: 1 Placement Confirmation: positive ETCO2 and breath sounds checked- equal and bilateral Tube secured with: Tape Dental Injury: Teeth and Oropharynx as per pre-operative assessment

## 2021-06-03 NOTE — Op Note (Signed)
Preoperative diagnosis:  Obstructing left ureteral stone  Postoperative diagnosis:  Same  Procedure: Cystoscopy, left retrograde pyelogram with interpretation Left ureteroscopy, laser lithotripsy and stone removal Left ureteral stent exchange  Surgeon: Crist Fat, MD  Anesthesia: General  Complications: None  Intraoperative findings:  #1: Cystoscopy demonstrated normal bladder with no filling defects or abnormalities.  Stent was emanating from the left ureteral orifice #2: The stent was very encrusted and difficult to remove as a result. #3: The stone was encountered in the proximal left ureter, there was lots of encrustation fragments from the stent that had been peeled off upon removal.  This includes fragments within the renal pelvis and I was able to also removed. #4: 24 cm time 6 French double-J ureteral stent was exchanged at the end of the case. #5: The left retrograde pyelogram demonstrated mild hydroureteronephrosis down to the bladder, with no real significant filling defects save the proximal ureter with a known stone.  There were no additional filling defects or abnormalities.  EBL: Minimal  Specimens: None  Indication: Andrew [name] is a 87 y.o. patient with history of left renal colic who presented to my office several weeks prior with poorly controlled pain.  We placed a stent urgently that afternoon.  He presents today for more definitive management and stone removal..  After reviewing the management options for treatment, he elected to proceed with the above surgical procedure(s). We have discussed the potential benefits and risks of the procedure, side effects of the proposed treatment, the likelihood of the patient achieving the goals of the procedure, and any potential problems that might occur during the procedure or recuperation. Informed consent has been obtained.  Description of procedure:  The patient was taken to the operating room and general  anesthesia was induced.  The patient was placed in the dorsal lithotomy position, prepped and draped in the usual sterile fashion, and preoperative antibiotics were administered. A preoperative time-out was performed.   21 French 30 cystoscope was gently passed through the patient's urethra and into the bladder under visual guidance.  Cystoscopy was performed with the above findings.  I then grasped the encrusted stent and pulled to the urethral meatus.  I did have some difficulty getting it down to the meatus because of the encrustations proximally.  I was unable to wire the stent but with some gentle traction it ultimately did come out on its own atraumatically.  I subsequently repassed the scope and with a 5 [place] open-ended ureteral catheter performed retrograde pyelogram with the above findings.  I then advanced a wire through the open-ended catheter and up into the left renal pelvis.  I subsequently passed a semirigid ureteral scope up to the proximal ureter where I encountered the stone.  Using a 365 m laser fiber the stone was fragmented into numerous smaller pieces.  Then with an engage basket I removed all the pieces.  There were also additional stone fragments from the patient's stent.  I was able to get these out of the ureter as well.  I subsequently advanced a flexible ureteroscope up and through the ureter and into the left renal pelvis encountering numerous other stone fragments from the encrusted stent.  Ultimately was able to remove the bulk of these.  The remainder of of the fragments were very small and will easily pass.  I then squeezed the ureter noting no additional stone fragments of any meaningful size.  I subsequently advanced the cystoscope back over the wire and passed a  24 cm time 6 French double-J ureteral stent over the wire and into the left renal pelvis under fluoroscopic guidance.  A nice curl was noted in the upper pole.  I then advanced into the bladder neck before removing  the wire entirely.  I pulled the stent tether through the patient's urethra and secured to the dorsum of his penis.  I subsequently repassed the scope into the patient's bladder to ensure that all stone fragments had been removed removed and the patient was stone free.  Patient subsequently extubated return to the PACU next condition.  Disposition: The patient will be instructed to remove the stent on Monday, August 15.  He will have follow-up in 6 weeks with a renal ultrasound prior.  Crist Fat, M.D.

## 2021-06-03 NOTE — Anesthesia Postprocedure Evaluation (Signed)
Anesthesia Post Note  Patient: Andrew [name]  Procedure(s) Performed: CYSTOSCOPY, LEFT RETROGRADE, URETEROSCOPY,HOLMIUM LASER,STENT EXCHANGE (Left)     Patient location during evaluation: PACU Anesthesia Type: General Level of consciousness: awake and alert Pain management: pain level controlled Vital Signs Assessment: post-procedure vital signs reviewed and stable Respiratory status: spontaneous breathing, nonlabored ventilation, respiratory function stable and patient connected to nasal cannula oxygen Cardiovascular status: blood pressure returned to baseline and stable Postop Assessment: no apparent nausea or vomiting Anesthetic complications: no   No notable events documented.  Last Vitals:  Vitals:   06/03/21 1015 06/03/21 1030  BP: 139/77 (!) 159/89  Pulse: 78 72  Resp: 11 14  Temp:    SpO2: 94% 94%    Last Pain:  Vitals:   06/03/21 1030  PainSc: 2                  Cecile Hearing

## 2021-06-03 NOTE — Transfer of Care (Signed)
Immediate Anesthesia Transfer of Care Note  Patient: Andrew [name]  Procedure(s) Performed: CYSTOSCOPY, LEFT RETROGRADE, URETEROSCOPY,HOLMIUM LASER,STENT EXCHANGE (Left)  Patient Location: PACU  Anesthesia Type:General  Level of Consciousness: awake  Airway & Oxygen Therapy: Patient Spontanous Breathing and Patient connected to face mask oxygen  Post-op Assessment: Report given to RN and Post -op Vital signs reviewed and stable  Post vital signs: Reviewed and stable  Last Vitals:  Vitals Value Taken Time  BP 148/78 06/03/21 0947  Temp    Pulse 80 06/03/21 0948  Resp 12 06/03/21 0948  SpO2 100 % 06/03/21 0948  Vitals shown include unvalidated device data.  Last Pain: There were no vitals filed for this visit.       Complications: No notable events documented.

## 2021-06-04 ENCOUNTER — Encounter (HOSPITAL_COMMUNITY): Payer: Self-pay | Admitting: Urology

## 2021-06-08 LAB — CALCULI, WITH PHOTOGRAPH (CLINICAL LAB)
Uric Acid Calculi: 100 %
Weight Calculi: 314 mg

## 2021-06-15 ENCOUNTER — Other Ambulatory Visit: Payer: Self-pay

## 2021-06-15 ENCOUNTER — Ambulatory Visit (INDEPENDENT_AMBULATORY_CARE_PROVIDER_SITE_OTHER): Payer: Medicare Other

## 2021-06-15 DIAGNOSIS — Z5181 Encounter for therapeutic drug level monitoring: Secondary | ICD-10-CM

## 2021-06-15 DIAGNOSIS — I82409 Acute embolism and thrombosis of unspecified deep veins of unspecified lower extremity: Secondary | ICD-10-CM | POA: Diagnosis not present

## 2021-06-15 DIAGNOSIS — I442 Atrioventricular block, complete: Secondary | ICD-10-CM | POA: Diagnosis not present

## 2021-06-15 LAB — CUP PACEART REMOTE DEVICE CHECK
Battery Remaining Longevity: 82 mo
Battery Remaining Percentage: 65 %
Battery Voltage: 2.99 V
Brady Statistic AP VP Percent: 19 %
Brady Statistic AP VS Percent: 1 %
Brady Statistic AS VP Percent: 80 %
Brady Statistic AS VS Percent: 1 %
Brady Statistic RA Percent Paced: 18 %
Brady Statistic RV Percent Paced: 99 %
Date Time Interrogation Session: 20220824025303
Implantable Lead Implant Date: 20180825
Implantable Lead Implant Date: 20180825
Implantable Lead Location: 753859
Implantable Lead Location: 753860
Implantable Pulse Generator Implant Date: 20180825
Lead Channel Impedance Value: 460 Ohm
Lead Channel Impedance Value: 630 Ohm
Lead Channel Pacing Threshold Amplitude: 0.5 V
Lead Channel Pacing Threshold Amplitude: 0.75 V
Lead Channel Pacing Threshold Pulse Width: 0.5 ms
Lead Channel Pacing Threshold Pulse Width: 0.5 ms
Lead Channel Sensing Intrinsic Amplitude: 12 mV
Lead Channel Sensing Intrinsic Amplitude: 4.6 mV
Lead Channel Setting Pacing Amplitude: 0.75 V
Lead Channel Setting Pacing Amplitude: 2 V
Lead Channel Setting Pacing Pulse Width: 0.5 ms
Lead Channel Setting Sensing Sensitivity: 4 mV
Pulse Gen Model: 2272
Pulse Gen Serial Number: 8939923

## 2021-06-15 LAB — POCT INR: INR: 1.7 — AB (ref 2.0–3.0)

## 2021-06-15 NOTE — Patient Instructions (Signed)
-   take extra 1/2 tablet warfarin tonight and tomorrow, then  - continue warfarin dosage of 2.5 mg daily except 5 mg on MONDAYS, WEDNESDAYS & FRIDAYS. - Recheck INR in 4 weeks - Please be consistent w/ your green leafy vegetables  - pick a day (or days)  to have your greens and have them every week

## 2021-06-27 ENCOUNTER — Other Ambulatory Visit: Payer: Self-pay | Admitting: Internal Medicine

## 2021-06-30 NOTE — Progress Notes (Signed)
Remote pacemaker transmission.   

## 2021-07-13 ENCOUNTER — Other Ambulatory Visit: Payer: Self-pay

## 2021-07-13 ENCOUNTER — Ambulatory Visit (INDEPENDENT_AMBULATORY_CARE_PROVIDER_SITE_OTHER): Payer: Medicare Other

## 2021-07-13 DIAGNOSIS — I82409 Acute embolism and thrombosis of unspecified deep veins of unspecified lower extremity: Secondary | ICD-10-CM

## 2021-07-13 DIAGNOSIS — Z5181 Encounter for therapeutic drug level monitoring: Secondary | ICD-10-CM

## 2021-07-13 LAB — POCT INR: INR: 2.5 (ref 2.0–3.0)

## 2021-07-13 NOTE — Patient Instructions (Signed)
-   continue warfarin dosage of 2.5 mg daily except 5 mg on MONDAYS, WEDNESDAYS & FRIDAYS. - Recheck INR in 5 weeks - Please be consistent w/ your green leafy vegetables  - pick a day (or days)  to have your greens and have them every week

## 2021-08-17 ENCOUNTER — Encounter: Payer: Self-pay | Admitting: Internal Medicine

## 2021-08-17 ENCOUNTER — Other Ambulatory Visit: Payer: Self-pay

## 2021-08-17 ENCOUNTER — Ambulatory Visit (INDEPENDENT_AMBULATORY_CARE_PROVIDER_SITE_OTHER): Payer: Medicare Other | Admitting: *Deleted

## 2021-08-17 ENCOUNTER — Ambulatory Visit (INDEPENDENT_AMBULATORY_CARE_PROVIDER_SITE_OTHER): Payer: Medicare Other | Admitting: Internal Medicine

## 2021-08-17 VITALS — BP 130/70 | HR 83 | Ht 68.0 in | Wt 171.0 lb

## 2021-08-17 DIAGNOSIS — Z5181 Encounter for therapeutic drug level monitoring: Secondary | ICD-10-CM

## 2021-08-17 DIAGNOSIS — Z950 Presence of cardiac pacemaker: Secondary | ICD-10-CM

## 2021-08-17 DIAGNOSIS — I82409 Acute embolism and thrombosis of unspecified deep veins of unspecified lower extremity: Secondary | ICD-10-CM | POA: Diagnosis not present

## 2021-08-17 DIAGNOSIS — I442 Atrioventricular block, complete: Secondary | ICD-10-CM | POA: Diagnosis not present

## 2021-08-17 DIAGNOSIS — I10 Essential (primary) hypertension: Secondary | ICD-10-CM

## 2021-08-17 LAB — POCT INR: INR: 2.5 (ref 2.0–3.0)

## 2021-08-17 NOTE — Patient Instructions (Signed)
Medication Instructions:  Your physician recommends that you continue on your current medications as directed. Please refer to the Current Medication list given to you today.  Labwork: None ordered.  Testing/Procedures: None ordered.  Follow-Up: Your physician wants you to follow-up in: one year with Lewayne Bunting, MD or one of the following Advanced Practice Providers on your designated Care Team:   Francis Dowse, [place] Casimiro Needle "Mardelle Matte" Lanna Poche, [place]  Remote monitoring is used to monitor your Pacemaker from home. This monitoring reduces the number of office visits required to check your device to one time per year. It allows [place] to keep an eye on the functioning of your device to ensure it is working properly. You are scheduled for a device check from home on 08/31/2021. You may send your transmission at any time that day. If you have a wireless device, the transmission will be sent automatically. After your physician reviews your transmission, you will receive a postcard with your next transmission date.  Any Other Special Instructions Will Be Listed Below (If Applicable).  If you need a refill on your cardiac medications before your next appointment, please call your pharmacy.

## 2021-08-17 NOTE — Progress Notes (Signed)
HPI Andrew [name] returns today for ongoing evaluation and management of complete heart block status post pacemaker insertion. He is a very pleasant 87 year old man with a history of coronary artery disease status post bypass surgery, hypertension, diabetes, and dyslipidemia who is s/p PPM insertion for CHB.His symptoms have resolved. He denies chest pain or sob.  Allergies  Allergen Reactions   Penicillins Other (See Comments)    Taste buds     Current Outpatient Medications  Medication Sig Dispense Refill   aspirin EC 81 MG tablet Take 81 mg by mouth daily.     atorvastatin (LIPITOR) 20 MG tablet TAKE 1 TABLET DAILY AT     6:00PM (Patient taking differently: Take 20 mg by mouth daily. TAKE 1 TABLET DAILY AT     6:00PM) 90 tablet 1   carvedilol (COREG) 6.25 MG tablet Take 1 tablet (6.25 mg total) by mouth 2 (two) times daily with a meal. 180 tablet 3   celecoxib (CELEBREX) 100 MG capsule TAKE 1 CAPSULE DAILY AS    NEEDED 90 capsule 1   cholecalciferol (VITAMIN D) 1000 UNITS tablet Take 1,000 Units by mouth daily.     HYDROcodone-acetaminophen (NORCO/VICODIN) 5-325 MG tablet Take 1 tablet by mouth every 6 (six) hours as needed for moderate pain.     metFORMIN (GLUCOPHAGE-XR) 500 MG 24 hr tablet TAKE 1 TABLET TWICE A DAY (Patient taking differently: Take 500 mg by mouth in the morning and at bedtime.) 180 tablet 3   Multiple Vitamin (MULTIVITAMIN WITH MINERALS) TABS tablet Take 1 tablet by mouth daily.     niacin (NIASPAN) 1000 MG CR tablet TAKE 1 TABLET AT BEDTIME (Patient taking differently: Take 1,000 mg by mouth at bedtime.) 90 tablet 3   warfarin (JANTOVEN) 5 MG tablet TAKE AS DIRECTED BY THE    [facility] (Patient taking differently: Take 2.5-5 mg by mouth See admin instructions. TAKE AS DIRECTED BY THE COUMADIN CLINIC; 5 mg Mon Wed and Fri, and 2.5 mg all other days) 75 tablet 3   No current facility-administered medications for this visit.     Past Medical History:   Diagnosis Date   Colonic polyp 2011 last colo   colo q 35yr - [facility]    Coronary artery disease 02/2009   severe left main and three-vessel, CABG   DEGENERATIVE JOINT DISEASE    Diverticulosis of colon    DIVERTICULOSIS, COLON    History of kidney stones    HYPERLIPIDEMIA-MIXED    HYPERTENSION, BENIGN    LBP (low back pain)    s/p decompression 8/14; ESI - [place]; RFA x 3 early 2015   Pacemaker    a. 05/2017: St. Jude (serial number [id number]) pacemaker   Symptomatic bradycardia    a. s/p St. Jude (serial number [id number]) pacemaker 06/16/17   Type II or unspecified type diabetes mellitus without mention of complication, not stated as uncontrolled    VITAMIN D DEFICIENCY     ROS:   All systems reviewed and negative except as noted in the HPI.   Past Surgical History:  Procedure Laterality Date   CARPAL TUNNEL RELEASE Right    CATARACT EXTRACTION, BILATERAL     R 07/07/13, L 07/21/13   CORONARY ARTERY BYPASS GRAFT  03/17/09   x 4   CYSTOSCOPY WITH STENT PLACEMENT Left 05/10/2021   Procedure: CYSTOSCOPY WITH LEFT  STENT PLACEMENT,LEFT RETROGRADE PYELOGRAM;  Surgeon: Crist Fat, MD;  Location: [facility];  Service: Urology;  Laterality: Left;  CYSTOSCOPY/URETEROSCOPY/HOLMIUM LASER/STENT PLACEMENT Left 06/03/2021   Procedure: CYSTOSCOPY, LEFT RETROGRADE, URETEROSCOPY,HOLMIUM LASER,STENT EXCHANGE;  Surgeon: Crist Fat, MD;  Location: [facility];  Service: Urology;  Laterality: Left;   INGUINAL HERNIA REPAIR  1980   KNEE ARTHROSCOPY  2007   right   LUMBAR EPIDURAL INJECTION  2013   Has as needed.   LUMBAR LAMINECTOMY/DECOMPRESSION MICRODISCECTOMY N/A 06/19/2013   Procedure: LUMBAR LAMINECTOMY/DECOMPRESSION MICRODISCECTOMY;  Surgeon: Emilee Hero, MD;  Location[facility];  Service: Orthopedics;  Laterality: N/A;  Lumbar 4-5 decompression   PACEMAKER IMPLANT N/A 06/16/2017   Procedure: Pacemaker Implant;  Surgeon: Marinus Maw, MD;  Location: [facility] INVAS[facility] CV LAB;  Service:  Cardiovascular;  Laterality: N/A;   TRIGGER FINGER RELEASE Right      Family History  Problem Relation Age of Onset   Heart failure Mother        died age 71   Heart failure Father        died age 45   Diabetes Other    Heart disease Neg Hx      Social History   Socioeconomic History   Marital status: Married    Spouse name: Not on file   Number of children: Not on file   Years of education: Not on file   Highest education level: Not on file  Occupational History   Not on file  Tobacco Use   Smoking status: Never   Smokeless tobacco: Never   Tobacco comments:    Married, 3 grown children. 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 used  Substance and Sexual Activity   Alcohol use: Yes    Comment: RARE   Drug use: No   Sexual activity: Not on file  Other Topics Concern   Not on file  Social History Narrative   Not on file   Social Determinants of Health   Financial Resource Strain: Low Risk    Difficulty of Paying Living Expenses: Not hard at all  Food Insecurity: No Food Insecurity   Worried About Programme researcher, broadcasting/film/video in the Last Year: Never true   Ran Out of Food in the Last Year: Never true  Transportation Needs: No Transportation Needs   Lack of Transportation (Medical): No   Lack of Transportation (Non-Medical): No  Physical Activity: Sufficiently Active   Days of Exercise per Week: 5 days   Minutes of Exercise per Session: 30 min  Stress: No Stress Concern Present   Feeling of Stress : Not at all  Social Connections: Moderately Integrated   Frequency of Communication with Friends and Family: More than three times a week   Frequency of Social Gatherings with Friends and Family: Once a week   Attends Religious Services: Never   Database administrator or Organizations: Yes   Attends Engineer, structural: 1 to 4 times per year   Marital Status: Married  Catering manager Violence: Not At Risk   Fear of  Current or Ex-Partner: No   Emotionally Abused: No   Physically Abused: No   Sexually Abused: No     BP 130/70   Pulse 83   Ht 5\' 8"  (1.727 m)   Wt 171 lb (77.6 kg)   SpO2 94%   BMI 26.00 kg/m   Physical Exam:  Well appearing NAD HEENT: Unremarkable Neck:  No JVD, no thyromegally Lymphatics:  No adenopathy Back:  No CVA tenderness Lungs:  Clear HEART:  Regular rate rhythm,  no murmurs, no rubs, no clicks Abd:  soft, positive bowel sounds, no organomegally, no rebound, no guarding Ext:  2 plus pulses, no edema, no cyanosis, no clubbing Skin:  No rashes no nodules Neuro:  CN II through XII intact, motor grossly intact  DEVICE  Normal device function.  See PaceArt for details.   Assess/Plan:   1. CHB - he is s/p PPM insertion. He is asymptomatic with no escape today. 2. PPM - his St. Jude DDD PM is working normally today. He will be rechecked in several months. 3. Dyslipidemia - he will continue his statin drugs. No muscle aches. 4. HTN - his SBP is minimally elevated. He will maintain a low sodium diet.   Andrew Gowda Trinten Boudoin,MD

## 2021-08-17 NOTE — Patient Instructions (Signed)
Description   Continue warfarin dosage of 2.5 mg daily except 5 mg on MONDAYS, WEDNESDAYS & FRIDAYS. Recheck INR in 6 weeks. Keep your green leafy vegetables- pick a day (or days)  to have your greens and have them every week

## 2021-09-01 LAB — HM DIABETES EYE EXAM

## 2021-09-14 ENCOUNTER — Ambulatory Visit (INDEPENDENT_AMBULATORY_CARE_PROVIDER_SITE_OTHER): Payer: Medicare Other

## 2021-09-14 DIAGNOSIS — I442 Atrioventricular block, complete: Secondary | ICD-10-CM

## 2021-09-14 LAB — CUP PACEART REMOTE DEVICE CHECK
Battery Remaining Longevity: 77 mo
Battery Remaining Percentage: 63 %
Battery Voltage: 2.99 V
Brady Statistic AP VP Percent: 3.5 %
Brady Statistic AP VS Percent: 1 %
Brady Statistic AS VP Percent: 96 %
Brady Statistic AS VS Percent: 1 %
Brady Statistic RA Percent Paced: 3.3 %
Brady Statistic RV Percent Paced: 99 %
Date Time Interrogation Session: 20221123043433
Implantable Lead Implant Date: 20180825
Implantable Lead Implant Date: 20180825
Implantable Lead Location: 753859
Implantable Lead Location: 753860
Implantable Pulse Generator Implant Date: 20180825
Lead Channel Impedance Value: 440 Ohm
Lead Channel Impedance Value: 630 Ohm
Lead Channel Pacing Threshold Amplitude: 0.5 V
Lead Channel Pacing Threshold Amplitude: 0.875 V
Lead Channel Pacing Threshold Pulse Width: 0.5 ms
Lead Channel Pacing Threshold Pulse Width: 0.5 ms
Lead Channel Sensing Intrinsic Amplitude: 12 mV
Lead Channel Sensing Intrinsic Amplitude: 4.7 mV
Lead Channel Setting Pacing Amplitude: 1.125
Lead Channel Setting Pacing Amplitude: 2 V
Lead Channel Setting Pacing Pulse Width: 0.5 ms
Lead Channel Setting Sensing Sensitivity: 4 mV
Pulse Gen Model: 2272
Pulse Gen Serial Number: 8939923

## 2021-09-26 NOTE — Progress Notes (Signed)
Remote pacemaker transmission.   

## 2021-09-28 ENCOUNTER — Ambulatory Visit (INDEPENDENT_AMBULATORY_CARE_PROVIDER_SITE_OTHER): Payer: Medicare Other

## 2021-09-28 ENCOUNTER — Other Ambulatory Visit: Payer: Self-pay

## 2021-09-28 DIAGNOSIS — Z5181 Encounter for therapeutic drug level monitoring: Secondary | ICD-10-CM | POA: Diagnosis not present

## 2021-09-28 DIAGNOSIS — I82409 Acute embolism and thrombosis of unspecified deep veins of unspecified lower extremity: Secondary | ICD-10-CM | POA: Diagnosis not present

## 2021-09-28 LAB — POCT INR: INR: 3.9 — AB (ref 2.0–3.0)

## 2021-09-28 NOTE — Patient Instructions (Signed)
-   skip warfarin tonight, then - Continue warfarin dosage of 2.5 mg daily except 5 mg on MONDAYS, WEDNESDAYS & FRIDAYS.  - Recheck INR in 6 weeks.  Keep your green leafy vegetables- pick a day (or days)  to have your greens and have them every week

## 2021-10-02 ENCOUNTER — Encounter: Payer: Self-pay | Admitting: Internal Medicine

## 2021-10-02 NOTE — Progress Notes (Signed)
Subjective:    [name], male    DOB: 07/16/1934, 87 y.o.   MRN: [id number]  This visit occurred during the SARS-CoV-2 public health emergency.  Safety protocols were in place, including screening questions prior to the visit, additional usage of staff PPE, and extensive cleaning of exam room while observing appropriate contact time as indicated for disinfecting solutions.     HPI The patient is here for follow up of their chronic medical problems, including DM, htn, hld, OA, CKD  Energy level is blah.  He feels a little weak.    Injections in back 4/year - they are helping.   He does get short of breath with exertion.  He attributed that to deconditioning and being 87.  Medications and allergies reviewed with patient and updated if appropriate.  Patient Active Problem List   Diagnosis Date Noted   Encounter for general adult medical examination with abnormal findings 05/15/2021   Leg weakness, bilateral 04/01/2021   CKD (chronic kidney disease) stage 3, GFR 30-59 ml/min (HCC) 03/31/2021   Pacemaker 07/21/2019   DVT (deep venous thrombosis) (HCC) 08/28/2017   Complete heart block (HCC) 06/15/2017   Wears hearing aid 09/25/2016   Degeneration of lumbar or lumbosacral intervertebral disc 03/23/2015   Orthostatic hypotension 03/27/2013   Vitamin D deficiency 11/03/2010   DIVERTICULOSIS, COLON 11/03/2010   Osteoarthritis (arthritis due to wear and tear of joints) 11/03/2010   Dyslipidemia, goal LDL below 70 12/01/2009   HYPERTENSION, BENIGN 12/01/2009   CAD, ARTERY BYPASS GRAFT 12/01/2009   Type 2 diabetes mellitus with neurological manifestations, controlled (HCC)     Current Outpatient Medications on File Prior to Visit  Medication Sig Dispense Refill   aspirin EC 81 MG tablet Take 81 mg by mouth daily.     atorvastatin (LIPITOR) 20 MG tablet TAKE 1 TABLET DAILY AT     6:00PM (Patient taking differently: Take 20 mg by mouth daily. TAKE 1 TABLET  DAILY AT     6:00PM) 90 tablet 1   carvedilol (COREG) 6.25 MG tablet Take 1 tablet (6.25 mg total) by mouth 2 (two) times daily with a meal. 180 tablet 3   cholecalciferol (VITAMIN D) 1000 UNITS tablet Take 1,000 Units by mouth daily.     HYDROcodone-acetaminophen (NORCO/VICODIN) 5-325 MG tablet Take 1 tablet by mouth every 6 (six) hours as needed for moderate pain.     imiquimod (ALDARA) 5 % cream SMARTSIG:Sparingly Topical Every Night     metFORMIN (GLUCOPHAGE-XR) 500 MG 24 hr tablet TAKE 1 TABLET TWICE A DAY (Patient taking differently: Take 500 mg by mouth in the morning and at bedtime.) 180 tablet 3   Multiple Vitamin (MULTIVITAMIN WITH MINERALS) TABS tablet Take 1 tablet by mouth daily.     niacin (NIASPAN) 1000 MG CR tablet TAKE 1 TABLET AT BEDTIME (Patient taking differently: Take 1,000 mg by mouth at bedtime.) 90 tablet 3   warfarin (JANTOVEN) 5 MG tablet TAKE AS DIRECTED BY THE    [facility] (Patient taking differently: Take 2.5-5 mg by mouth See admin instructions. TAKE AS DIRECTED BY THE COUMADIN CLINIC; 5 mg Mon Wed and Fri, and 2.5 mg all other days) 75 tablet 3   No current facility-administered medications on file prior to visit.    Past Medical History:  Diagnosis Date   Colonic polyp 2011 last colo   colo q 45yr - [facility]    Coronary artery disease 02/2009   severe left main and  three-vessel, CABG   DEGENERATIVE JOINT DISEASE    Diverticulosis of colon    DIVERTICULOSIS, COLON    History of kidney stones    HYPERLIPIDEMIA-MIXED    HYPERTENSION, BENIGN    LBP (low back pain)    s/p decompression 8/14; ESI - [place]; RFA x 3 early 2015   Pacemaker    a. 05/2017: St. Jude (serial number [id number]) pacemaker   Symptomatic bradycardia    a. s/p St. Jude (serial number [id number]) pacemaker 06/16/17   Type II or unspecified type diabetes mellitus without mention of complication, not stated as uncontrolled    VITAMIN D DEFICIENCY     Past Surgical History:  Procedure  Laterality Date   CARPAL TUNNEL RELEASE Right    CATARACT EXTRACTION, BILATERAL     R 07/07/13, L 07/21/13   CORONARY ARTERY BYPASS GRAFT  03/17/09   x 4   CYSTOSCOPY WITH STENT PLACEMENT Left 05/10/2021   Procedure: CYSTOSCOPY WITH LEFT  STENT PLACEMENT,LEFT RETROGRADE PYELOGRAM;  Surgeon: Ardis Hughs, MD;  Location: [facility];  Service: Urology;  Laterality: Left;   CYSTOSCOPY/URETEROSCOPY/HOLMIUM LASER/STENT PLACEMENT Left 06/03/2021   Procedure: CYSTOSCOPY, LEFT RETROGRADE, URETEROSCOPY,HOLMIUM LASER,STENT EXCHANGE;  Surgeon: Ardis Hughs, MD;  Location: [facility];  Service: Urology;  Laterality: Left;   INGUINAL HERNIA REPAIR  1980   KNEE ARTHROSCOPY  2007   right   LUMBAR EPIDURAL INJECTION  2013   Has as needed.   LUMBAR LAMINECTOMY/DECOMPRESSION MICRODISCECTOMY N/A 06/19/2013   Procedure: LUMBAR LAMINECTOMY/DECOMPRESSION MICRODISCECTOMY;  Surgeon: Sinclair Ship, MD;  Location: [facility];  Service: Orthopedics;  Laterality: N/A;  Lumbar 4-5 decompression   PACEMAKER IMPLANT N/A 06/16/2017   Procedure: Pacemaker Implant;  Surgeon: Evans Lance, MD;  Location: [facility];  Service: Cardiovascular;  Laterality: N/A;   TRIGGER FINGER RELEASE Right     Social History   Socioeconomic History   Marital status: Married    Spouse name: Not on file   Number of children: Not on file   Years of education: Not on file   Highest education level: Not on file  Occupational History   Not on file  Tobacco Use   Smoking status: Never   Smokeless tobacco: Never   Tobacco comments:    Married, 3 grown children. Retired 04/2002 from [employer] work  Scientific laboratory technician Use: Never used  Substance and Sexual Activity   Alcohol use: Yes    Comment: RARE   Drug use: No   Sexual activity: Not on file  Other Topics Concern   Not on file  Social History Narrative   Not on file   Social Determinants of Health   Financial Resource Strain: Low Risk     Difficulty of Paying Living Expenses: Not hard at all  Food Insecurity: No Food Insecurity   Worried About Charity fundraiser in the Last Year: Never true   [place] in the Last Year: Never true  Transportation Needs: No Transportation Needs   Lack of Transportation (Medical): No   Lack of Transportation (Non-Medical): No  Physical Activity: Sufficiently Active   Days of Exercise per Week: 5 days   Minutes of Exercise per Session: 30 min  Stress: No Stress Concern Present   Feeling of Stress : Not at all  Social Connections: Moderately Integrated   Frequency of Communication with Friends and Family: More than three times a week   Frequency of Social Gatherings with Friends and  Family: Once a week   Attends Religious Services: Never   Marine scientist or Organizations: Yes   Attends Music therapist: 1 to 4 times per year   Marital Status: Married    Family History  Problem Relation Age of Onset   Heart failure Mother        died age 34   Heart failure Father        died age 40   Diabetes Other    Heart disease Neg Hx     Review of Systems  Constitutional:  Negative for chills and fever.  Respiratory:  Positive for shortness of breath (with activity). Negative for cough and wheezing.   Cardiovascular:  Negative for chest pain, palpitations and leg swelling.  Musculoskeletal:  Positive for back pain.  Neurological:  Positive for headaches. Negative for light-headedness.      Objective:   Vitals:   10/03/21 0831  BP: 120/62  Pulse: 75  Temp: 97.9 F (36.6 C)  SpO2: 97%   BP Readings from Last 3 Encounters:  10/03/21 120/62  08/17/21 130/70  06/03/21 (!) 159/89   Wt Readings from Last 3 Encounters:  10/03/21 181 lb (82.1 kg)  08/17/21 171 lb (77.6 kg)  05/19/21 169 lb 6.4 oz (76.8 kg)   Body mass index is 27.52 kg/m.   Physical Exam    Constitutional: Appears well-developed and well-nourished. No distress.  HENT:  Head:  Normocephalic and atraumatic.  Neck: Neck supple. No tracheal deviation present. No thyromegaly present.  No cervical lymphadenopathy Cardiovascular: Normal rate, regular rhythm and normal heart sounds.  No murmur heard. No carotid bruit .  No edema Pulmonary/Chest: Effort normal and breath sounds normal. No respiratory distress. No has no wheezes.  Bibasilar dry crackles Skin: Skin is warm and dry. Not diaphoretic.  Psychiatric: Normal mood and affect. Behavior is normal.      Assessment & Plan:    See Problem List for Assessment and Plan of chronic medical problems.

## 2021-10-03 ENCOUNTER — Other Ambulatory Visit: Payer: Self-pay

## 2021-10-03 ENCOUNTER — Ambulatory Visit (INDEPENDENT_AMBULATORY_CARE_PROVIDER_SITE_OTHER): Payer: Medicare Other | Admitting: Internal Medicine

## 2021-10-03 VITALS — BP 120/62 | HR 75 | Temp 97.9°F | Ht 68.0 in | Wt 181.0 lb

## 2021-10-03 DIAGNOSIS — I10 Essential (primary) hypertension: Secondary | ICD-10-CM

## 2021-10-03 DIAGNOSIS — E785 Hyperlipidemia, unspecified: Secondary | ICD-10-CM

## 2021-10-03 DIAGNOSIS — Z23 Encounter for immunization: Secondary | ICD-10-CM

## 2021-10-03 DIAGNOSIS — M159 Polyosteoarthritis, unspecified: Secondary | ICD-10-CM

## 2021-10-03 DIAGNOSIS — E1149 Type 2 diabetes mellitus with other diabetic neurological complication: Secondary | ICD-10-CM | POA: Diagnosis not present

## 2021-10-03 DIAGNOSIS — R0609 Other forms of dyspnea: Secondary | ICD-10-CM

## 2021-10-03 DIAGNOSIS — N1832 Chronic kidney disease, stage 3b: Secondary | ICD-10-CM | POA: Diagnosis not present

## 2021-10-03 LAB — LIPID PANEL
Cholesterol: 126 mg/dL (ref 0–200)
HDL: 50.6 mg/dL (ref >39.00)
LDL Cholesterol: 54 mg/dL (ref 0–99)
NonHDL: 75.73
Total CHOL/HDL Ratio: 2
Triglycerides: 109 mg/dL (ref 0.0–149.0)
VLDL: 21.8 mg/dL (ref 0.0–40.0)

## 2021-10-03 LAB — COMPREHENSIVE METABOLIC PANEL
ALT: 21 U/L (ref 0–53)
AST: 24 U/L (ref 0–37)
Albumin: 3.8 g/dL (ref 3.5–5.2)
Alkaline Phosphatase: 57 U/L (ref 39–117)
BUN: 28 mg/dL — ABNORMAL HIGH (ref 6–23)
CO2: 28 mEq/L (ref 19–32)
Calcium: 9.2 mg/dL (ref 8.4–10.5)
Chloride: 106 mEq/L (ref 96–112)
Creatinine, Ser: 1.57 mg/dL — ABNORMAL HIGH (ref 0.40–1.50)
GFR: 40 mL/min — ABNORMAL LOW (ref >60.00)
Glucose, Bld: 216 mg/dL — ABNORMAL HIGH (ref 70–99)
Potassium: 4.8 mEq/L (ref 3.5–5.1)
Sodium: 140 mEq/L (ref 135–145)
Total Bilirubin: 0.4 mg/dL (ref 0.2–1.2)
Total Protein: 6.4 g/dL (ref 6.0–8.3)

## 2021-10-03 LAB — CBC WITH DIFFERENTIAL/PLATELET
Basophils Absolute: 0 10*3/uL (ref 0.0–0.1)
Basophils Relative: 0.8 % (ref 0.0–3.0)
Eosinophils Absolute: 0.3 10*3/uL (ref 0.0–0.7)
Eosinophils Relative: 4.7 % (ref 0.0–5.0)
HCT: 34.2 % — ABNORMAL LOW (ref 39.0–52.0)
Hemoglobin: 11.4 g/dL — ABNORMAL LOW (ref 13.0–17.0)
Lymphocytes Relative: 27 % (ref 12.0–46.0)
Lymphs Abs: 1.5 10*3/uL (ref 0.7–4.0)
MCHC: 33.4 g/dL (ref 30.0–36.0)
MCV: 101.6 fl — ABNORMAL HIGH (ref 78.0–100.0)
Monocytes Absolute: 0.8 10*3/uL (ref 0.1–1.0)
Monocytes Relative: 13.8 % — ABNORMAL HIGH (ref 3.0–12.0)
Neutro Abs: 3 10*3/uL (ref 1.4–7.7)
Neutrophils Relative %: 53.7 % (ref 43.0–77.0)
Platelets: 176 10*3/uL (ref 150.0–400.0)
RBC: 3.36 Mil/uL — ABNORMAL LOW (ref 4.22–5.81)
RDW: 13.8 % (ref 11.5–15.5)
WBC: 5.6 10*3/uL (ref 4.0–10.5)

## 2021-10-03 LAB — HEMOGLOBIN A1C: Hgb A1c MFr Bld: 7.3 % — ABNORMAL HIGH (ref 4.6–6.5)

## 2021-10-03 NOTE — Assessment & Plan Note (Signed)
Chronic Lab Results  Component Value Date   HGBA1C 7.3 (H) 05/19/2021   Sugar slightly above goal 6 months ago Check A1c today Continue metformin XR 500 mg twice daily-we will adjust medication if needed Diabetic diet, regular exercise

## 2021-10-03 NOTE — Assessment & Plan Note (Signed)
Chronic Regular exercise and healthy diet encouraged Check lipid panel  Continue niacin 1000 mg daily, atorvastatin 20 mg daily

## 2021-10-03 NOTE — Assessment & Plan Note (Addendum)
Chronic Has been stable He is on low-dose Celebrex due to severe, generalized osteoarthritis-we will discontinue and see if Tylenol is effective CMP

## 2021-10-03 NOTE — Assessment & Plan Note (Signed)
Chronic Advise discontinuing Celebrex due to CKD Advised 3000 mg of Tylenol daily as needed and topical arthritis medications

## 2021-10-03 NOTE — Patient Instructions (Addendum)
  Prevnar 20 - pneumonia vaccine today   Blood work was ordered.     Medications changes include :   stop celebrex.  Do not take any advil/ibuprofen/ aleve.   Take tylenol 1000 mg three times daily for your arthritis if needed.  You can use topical arthritis medications on your joints.    Your prescription(s) have been submitted to your pharmacy. Please take as directed and contact our office if you believe you are having problem(s) with the medication(s).  A referral was ordered for Pulmonary for your shortness of breath.     Please followup in 6 months

## 2021-10-03 NOTE — Assessment & Plan Note (Signed)
Chronic Has been dealing with shortness of breath with exertion for a while-he attributes this to his age and deconditioning Does see cardiology He does have some dry crackles at the bases of his lungs bilaterally-has not seen a pulmonologist ruled out pulmonary causes for his dyspnea-we will refer to pulmonary

## 2021-10-03 NOTE — Assessment & Plan Note (Signed)
Chronic Blood pressure well controlled CMP Continue Coreg 6.25 mg twice daily

## 2021-10-11 ENCOUNTER — Ambulatory Visit (INDEPENDENT_AMBULATORY_CARE_PROVIDER_SITE_OTHER): Payer: Medicare Other | Admitting: Pulmonary Disease

## 2021-10-11 ENCOUNTER — Encounter: Payer: Self-pay | Admitting: Pulmonary Disease

## 2021-10-11 ENCOUNTER — Other Ambulatory Visit: Payer: Self-pay

## 2021-10-11 VITALS — BP 116/72 | HR 77 | Ht 68.0 in | Wt 180.0 lb

## 2021-10-11 DIAGNOSIS — R0602 Shortness of breath: Secondary | ICD-10-CM

## 2021-10-11 NOTE — Progress Notes (Signed)
Synopsis: Referred in December 2022 for shortness of breath by Cheryll Cockayne, MD  Subjective:   [name] GENDER: male DOB: 1933/11/11, MRN: [id number]   HPI  Chief Complaint  Patient presents with   Consult    Referred by PCP for DOE. States this has been going on for over a year. Denies any other symptoms.    Bland Rudzinski is an 87 year old man, never smoker with DM II, CAD, complete heart block s/p pacemaker, CKDIII, chronic DVT and hypertension who is referred to pulmonary clinic for shortness of breath.   Patient reports progressive exertional dyspnea over the last 1 year.  He notices it mainly when walking up the steps in his home.  He denies any fatigue associated with the dyspnea.  He is able to rest and recover and then continue on with any further exertional activity.  He denies any chest pain with the shortness of breath.  He denies any changes in lower extremity edema as he has chronic left lower extremity edema due to a chronic DVT.  He was seen by cardiology 08/17/21 with no complaints of dyspnea documented at that time as he did not bring this to their attention. Note reviewed by Dr. Lawerance Bach, PCP, from 10/03/21 which noted concern for chronic dyspnea with exertion and dry crackles on exam bilaterally.   Patient denies any cough or wheezing with the shortness of breath.  He denies any nighttime awakenings with shortness of breath.  He is a never smoker.  Denies any significant secondhand smoke exposure.  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  He denies any significant dust or chemical exposures throughout his career.  He denies any significant heartburn or reflux symptoms.  Past Medical History:  Diagnosis Date   Colonic polyp 2011 last colo   colo q 18yr - [facility]    Coronary artery disease 02/2009   severe left main and three-vessel, CABG   DEGENERATIVE JOINT DISEASE    Diverticulosis of  colon    DIVERTICULOSIS, COLON    History of kidney stones    HYPERLIPIDEMIA-MIXED    HYPERTENSION, BENIGN    LBP (low back pain)    s/p decompression 8/14; ESI - [place]; RFA x 3 early 2015   Pacemaker    a. 05/2017: St. Jude (serial number [id number]) pacemaker   Symptomatic bradycardia    a. s/p St. Jude (serial number [id number]) pacemaker 06/16/17   Type II or unspecified type diabetes mellitus without mention of complication, not stated as uncontrolled    VITAMIN D DEFICIENCY      Family History  Problem Relation Age of Onset   Heart failure Mother        died age 97   Heart failure Father        died age 69   Diabetes Other    Heart disease Neg Hx      Social History   Socioeconomic History   Marital status: Married    Spouse name: Not on file   Number of children: Not on file   Years of education: Not on file   Highest education level: Not on file  Occupational History   Not on file  Tobacco Use   Smoking status: Never   Smokeless tobacco: Never   Tobacco comments:    Married, 3 grown children. 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 used  Substance  and Sexual Activity   Alcohol use: Yes    Comment: RARE   Drug use: No   Sexual activity: Not on file  Other Topics Concern   Not on file  Social History Narrative   Not on file   Social Determinants of Health   Financial Resource Strain: Low Risk    Difficulty of Paying Living Expenses: Not hard at all  Food Insecurity: No Food Insecurity   Worried About Running Out of Food in the Last Year: Never true   Ran Out of Food in the Last Year: Never true  Transportation Needs: No Transportation Needs   Lack of Transportation (Medical): No   Lack of Transportation (Non-Medical): No  Physical Activity: Sufficiently Active   Days of Exercise per Week: 5 days   Minutes of Exercise per Session: 30 min  Stress: No Stress Concern Present   Feeling of Stress : Not at all   Social Connections: Moderately Integrated   Frequency of Communication with Friends and Family: More than three times a week   Frequency of Social Gatherings with Friends and Family: Once a week   Attends Religious Services: Never   Database administrator or Organizations: Yes   Attends Engineer, structural: 1 to 4 times per year   Marital Status: Married  Catering manager Violence: Not At Risk   Fear of Current or Ex-Partner: No   Emotionally Abused: No   Physically Abused: No   Sexually Abused: No     Allergies  Allergen Reactions   Penicillins Other (See Comments)    Taste buds     Outpatient Medications Prior to Visit  Medication Sig Dispense Refill   aspirin EC 81 MG tablet Take 81 mg by mouth daily.     atorvastatin (LIPITOR) 20 MG tablet TAKE 1 TABLET DAILY AT     6:00PM (Patient taking differently: Take 20 mg by mouth daily. TAKE 1 TABLET DAILY AT     6:00PM) 90 tablet 1   carvedilol (COREG) 6.25 MG tablet Take 1 tablet (6.25 mg total) by mouth 2 (two) times daily with a meal. 180 tablet 3   cholecalciferol (VITAMIN D) 1000 UNITS tablet Take 1,000 Units by mouth daily.     HYDROcodone-acetaminophen (NORCO/VICODIN) 5-325 MG tablet Take 1 tablet by mouth every 6 (six) hours as needed for moderate pain.     imiquimod (ALDARA) 5 % cream SMARTSIG:Sparingly Topical Every Night     metFORMIN (GLUCOPHAGE-XR) 500 MG 24 hr tablet TAKE 1 TABLET TWICE A DAY (Patient taking differently: Take 500 mg by mouth in the morning and at bedtime.) 180 tablet 3   Multiple Vitamin (MULTIVITAMIN WITH MINERALS) TABS tablet Take 1 tablet by mouth daily.     niacin (NIASPAN) 1000 MG CR tablet TAKE 1 TABLET AT BEDTIME (Patient taking differently: Take 1,000 mg by mouth at bedtime.) 90 tablet 3   warfarin (JANTOVEN) 5 MG tablet TAKE AS DIRECTED BY THE    COUMADIN CLINIC (Patient taking differently: Take 2.5-5 mg by mouth See admin instructions. TAKE AS DIRECTED BY THE COUMADIN CLINIC; 5 mg Mon  Wed and Fri, and 2.5 mg all other days) 75 tablet 3   No facility-administered medications prior to visit.    Review of Systems  Constitutional:  Negative for chills, fever, malaise/fatigue and weight loss.  HENT:  Negative for congestion, sinus pain and sore throat.   Eyes: Negative.   Respiratory:  Positive for shortness of breath. Negative for cough, hemoptysis, sputum production  and wheezing.   Cardiovascular:  Negative for chest pain, palpitations, orthopnea, claudication and leg swelling.  Gastrointestinal:  Negative for abdominal pain, heartburn, nausea and vomiting.  Genitourinary: Negative.   Musculoskeletal:  Negative for joint pain and myalgias.  Skin:  Negative for rash.  Neurological:  Negative for weakness.  Endo/Heme/Allergies: Negative.   Psychiatric/Behavioral: Negative.       Objective:   Vitals:   10/11/21 1007  BP: 116/72  Pulse: 77  SpO2: 97%  Weight: 180 lb (81.6 kg)  Height: 5\' 8"  (1.727 m)   Physical Exam Constitutional:      General: He is not in acute distress. HENT:     Head: Normocephalic and atraumatic.  Eyes:     Extraocular Movements: Extraocular movements intact.     Conjunctiva/sclera: Conjunctivae normal.     Pupils: Pupils are equal, round, and reactive to light.  Cardiovascular:     Rate and Rhythm: Normal rate and regular rhythm.     Pulses: Normal pulses.     Heart sounds: Normal heart sounds. No murmur heard. Pulmonary:     Breath sounds: Rales (bibasilar) present. No wheezing or rhonchi.  Abdominal:     General: Bowel sounds are normal.     Palpations: Abdomen is soft.  Musculoskeletal:     Right lower leg: No edema.     Left lower leg: Edema present.  Lymphadenopathy:     Cervical: No cervical adenopathy.  Skin:    General: Skin is warm and dry.  Neurological:     General: No focal deficit present.     Mental Status: He is alert.  Psychiatric:        Mood and Affect: Mood normal.        Behavior: Behavior normal.         Thought Content: Thought content normal.        Judgment: Judgment normal.   CBC    Component Value Date/Time   WBC 5.6 10/03/2021 0923   RBC 3.36 (L) 10/03/2021 0923   HGB 11.4 (L) 10/03/2021 0923   HCT 34.2 (L) 10/03/2021 0923   PLT 176.0 10/03/2021 0923   MCV 101.6 (H) 10/03/2021 0923   MCH 32.5 06/16/2017 0546   MCHC 33.4 10/03/2021 0923   RDW 13.8 10/03/2021 0923   LYMPHSABS 1.5 10/03/2021 0923   MONOABS 0.8 10/03/2021 0923   EOSABS 0.3 10/03/2021 0923   BASOSABS 0.0 10/03/2021 0923   BMP Latest Ref Rng & Units 10/03/2021 05/06/2021 04/01/2021  Glucose 70 - 99 mg/dL 06/01/2021) 060(O) 459(X)  BUN 6 - 23 mg/dL 774(F) 42(L) 95(V)  Creatinine 0.40 - 1.50 mg/dL 20(E) 3.34(D) 5.68(S)  Sodium 135 - 145 mEq/L 140 139 139  Potassium 3.5 - 5.1 mEq/L 4.8 4.6 4.6  Chloride 96 - 112 mEq/L 106 105 105  CO2 19 - 32 mEq/L 28 27 26   Calcium 8.4 - 10.5 mg/dL 9.2 9.4 9.3   Chest imaging: CT Abd/Pelvis w/ contrast 05/06/21 Lower chest: Mild scarring at both lung bases. There is mild cardiomegaly status post median sternotomy. Pacemaker leads are seen within the right atrium and right ventricle. No significant pleural or pericardial effusion. There is a small hiatal hernia.  PFT: No flowsheet data found.  Labs:  Path:  Echo:  Heart Catheterization:  Assessment & Plan:   Shortness of breath - Plan: CT CHEST HIGH RESOLUTION, Pulmonary Function Test  Discussion: Ayeden Gladman is an 87 year old man, never smoker with DM II, CAD, complete heart block s/p  pacemaker, CKDIII, chronic DVT and hypertension who is referred to pulmonary clinic for shortness of breath.   His shortness of breath is progressive over the past 1 year.  He has bibasilar crackles on exam and mild subpleural scarring of the bilateral lung bases, left greater than right on recent CT abdomen pelvis from 04/2021.  There is concern for possible interstitial lung disease given these findings.  We will further evaluate  with high-resolution CT chest scan pulmonary function test.  Given his significant cardiac history I will discuss case with his primary cardiologist about updating an echocardiogram.  Depending on the results from the echocardiogram and CT chest scan/pulmonary function test we may consider a V/Q scan for evaluation of CTEPH given his history of DVT.  He is to follow-up in 4 to 6 weeks with pulmonary function test and office visit.Melody Comas, MD [facility] Office: [phone]   Current Outpatient Medications:    aspirin EC 81 MG tablet, Take 81 mg by mouth daily., Disp: , Rfl:    atorvastatin (LIPITOR) 20 MG tablet, TAKE 1 TABLET DAILY AT     6:00PM (Patient taking differently: Take 20 mg by mouth daily. TAKE 1 TABLET DAILY AT     6:00PM), Disp: 90 tablet, Rfl: 1   carvedilol (COREG) 6.25 MG tablet, Take 1 tablet (6.25 mg total) by mouth 2 (two) times daily with a meal., Disp: 180 tablet, Rfl: 3   cholecalciferol (VITAMIN D) 1000 UNITS tablet, Take 1,000 Units by mouth daily., Disp: , Rfl:    HYDROcodone-acetaminophen (NORCO/VICODIN) 5-325 MG tablet, Take 1 tablet by mouth every 6 (six) hours as needed for moderate pain., Disp: , Rfl:    imiquimod (ALDARA) 5 % cream, SMARTSIG:Sparingly Topical Every Night, Disp: , Rfl:    metFORMIN (GLUCOPHAGE-XR) 500 MG 24 hr tablet, TAKE 1 TABLET TWICE A DAY (Patient taking differently: Take 500 mg by mouth in the morning and at bedtime.), Disp: 180 tablet, Rfl: 3   Multiple Vitamin (MULTIVITAMIN WITH MINERALS) TABS tablet, Take 1 tablet by mouth daily., Disp: , Rfl:    niacin (NIASPAN) 1000 MG CR tablet, TAKE 1 TABLET AT BEDTIME (Patient taking differently: Take 1,000 mg by mouth at bedtime.), Disp: 90 tablet, Rfl: 3   warfarin (JANTOVEN) 5 MG tablet, TAKE AS DIRECTED BY THE    COUMADIN CLINIC (Patient taking differently: Take 2.5-5 mg by mouth See admin instructions. TAKE AS DIRECTED BY THE COUMADIN CLINIC; 5 mg Mon Wed and  Fri, and 2.5 mg all other days), Disp: 75 tablet, Rfl: 3

## 2021-10-11 NOTE — Patient Instructions (Signed)
There is concern for possible inflammation of the lungs or interstitial lung disease.   We will evaluate this further with a dedicated CT Chest scan and pulmonary function tests.  I will talk to your heart doctor about updating an echocardiogram.  Follow up in 4-6 weeks with pulmonary function tests.  Happy Holidays!

## 2021-10-19 ENCOUNTER — Other Ambulatory Visit: Payer: Self-pay

## 2021-10-19 ENCOUNTER — Ambulatory Visit (INDEPENDENT_AMBULATORY_CARE_PROVIDER_SITE_OTHER)
Admission: RE | Admit: 2021-10-19 | Discharge: 2021-10-19 | Disposition: A | Discharge status: Home / Self Care | Payer: Medicare Other | Source: Ambulatory Visit | Attending: Pulmonary Disease | Admitting: Pulmonary Disease

## 2021-10-19 DIAGNOSIS — R0602 Shortness of breath: Secondary | ICD-10-CM

## 2021-10-20 ENCOUNTER — Inpatient Hospital Stay: Admission: RE | Admit: 2021-10-20 | Payer: Medicare Other | Source: Ambulatory Visit

## 2021-10-21 ENCOUNTER — Other Ambulatory Visit: Payer: Self-pay | Admitting: Cardiovascular Disease

## 2021-10-21 ENCOUNTER — Other Ambulatory Visit: Payer: Self-pay | Admitting: Internal Medicine

## 2021-10-27 ENCOUNTER — Other Ambulatory Visit: Payer: Self-pay

## 2021-10-27 DIAGNOSIS — R0602 Shortness of breath: Secondary | ICD-10-CM

## 2021-10-27 DIAGNOSIS — I459 Conduction disorder, unspecified: Secondary | ICD-10-CM

## 2021-11-08 ENCOUNTER — Ambulatory Visit (HOSPITAL_COMMUNITY)
Admission: RE | Admit: 2021-11-08 | Discharge: 2021-11-08 | Disposition: A | Discharge status: Home / Self Care | Payer: Medicare Other | Source: Ambulatory Visit | Attending: Pulmonary Disease | Admitting: Pulmonary Disease

## 2021-11-08 ENCOUNTER — Other Ambulatory Visit: Payer: Self-pay

## 2021-11-08 DIAGNOSIS — I351 Nonrheumatic aortic (valve) insufficiency: Secondary | ICD-10-CM | POA: Diagnosis not present

## 2021-11-08 DIAGNOSIS — I700 Atherosclerosis of aorta: Secondary | ICD-10-CM | POA: Diagnosis not present

## 2021-11-08 DIAGNOSIS — R0602 Shortness of breath: Secondary | ICD-10-CM | POA: Diagnosis not present

## 2021-11-08 DIAGNOSIS — I119 Hypertensive heart disease without heart failure: Secondary | ICD-10-CM | POA: Insufficient documentation

## 2021-11-08 DIAGNOSIS — E119 Type 2 diabetes mellitus without complications: Secondary | ICD-10-CM | POA: Insufficient documentation

## 2021-11-08 DIAGNOSIS — I442 Atrioventricular block, complete: Secondary | ICD-10-CM | POA: Diagnosis present

## 2021-11-08 DIAGNOSIS — Z950 Presence of cardiac pacemaker: Secondary | ICD-10-CM | POA: Diagnosis not present

## 2021-11-08 DIAGNOSIS — I2510 Atherosclerotic heart disease of native coronary artery without angina pectoris: Secondary | ICD-10-CM | POA: Insufficient documentation

## 2021-11-08 DIAGNOSIS — I459 Conduction disorder, unspecified: Secondary | ICD-10-CM

## 2021-11-08 LAB — ECHOCARDIOGRAM COMPLETE
AR max vel: 2.14 cm2
AV Peak grad: 10.1 mmHg
Ao pk vel: 1.59 m/s
Area-P 1/2: 2.54 cm2
Calc EF: 55.2 %
P 1/2 time: 504 msec
S' Lateral: 2.8 cm
Single Plane A2C EF: 53.4 %
Single Plane A4C EF: 55.5 %

## 2021-11-08 NOTE — Progress Notes (Signed)
Echocardiogram 2D Echocardiogram has been performed.  Andrew [name] 11/08/2021, 9:25 AM

## 2021-11-09 ENCOUNTER — Ambulatory Visit (INDEPENDENT_AMBULATORY_CARE_PROVIDER_SITE_OTHER): Payer: Medicare Other

## 2021-11-09 DIAGNOSIS — I82409 Acute embolism and thrombosis of unspecified deep veins of unspecified lower extremity: Secondary | ICD-10-CM | POA: Diagnosis not present

## 2021-11-09 DIAGNOSIS — Z5181 Encounter for therapeutic drug level monitoring: Secondary | ICD-10-CM

## 2021-11-09 LAB — POCT INR: INR: 6.3 — AB (ref 2.0–3.0)

## 2021-11-09 NOTE — Patient Instructions (Signed)
-   skip warfarin tonight, tomorrow, and Friday, - on Saturday, START NEW DOSAGE warfarin dosage of 2.5 mg daily - Recheck INR in 2 weeks Keep your green leafy vegetables- pick a day (or days)  to have your greens and have them every week

## 2021-11-23 ENCOUNTER — Other Ambulatory Visit: Payer: Self-pay

## 2021-11-23 ENCOUNTER — Ambulatory Visit (INDEPENDENT_AMBULATORY_CARE_PROVIDER_SITE_OTHER): Payer: Medicare Other

## 2021-11-23 DIAGNOSIS — Z5181 Encounter for therapeutic drug level monitoring: Secondary | ICD-10-CM

## 2021-11-23 DIAGNOSIS — I82409 Acute embolism and thrombosis of unspecified deep veins of unspecified lower extremity: Secondary | ICD-10-CM

## 2021-11-23 LAB — POCT INR: INR: 1.9 — AB (ref 2.0–3.0)

## 2021-11-23 NOTE — Patient Instructions (Signed)
-   START NEW DOSAGE warfarin dosage of 2.5 mg daily except 1 tablet on WEDNESDAYS - Recheck INR in 3 weeks Keep your green leafy vegetables- pick a day (or days)  to have your greens and have them every week

## 2021-11-24 ENCOUNTER — Ambulatory Visit (INDEPENDENT_AMBULATORY_CARE_PROVIDER_SITE_OTHER): Payer: Medicare Other | Admitting: Pulmonary Disease

## 2021-11-24 ENCOUNTER — Encounter: Payer: Self-pay | Admitting: Pulmonary Disease

## 2021-11-24 ENCOUNTER — Other Ambulatory Visit: Payer: Self-pay

## 2021-11-24 VITALS — BP 118/72 | HR 83 | Ht 68.0 in | Wt 180.0 lb

## 2021-11-24 DIAGNOSIS — R0602 Shortness of breath: Secondary | ICD-10-CM | POA: Diagnosis not present

## 2021-11-24 DIAGNOSIS — J8410 Pulmonary fibrosis, unspecified: Secondary | ICD-10-CM

## 2021-11-24 LAB — PULMONARY FUNCTION TEST
DL/VA % pred: 84 %
DL/VA: 3.26 ml/min/mmHg/L
DLCO cor % pred: 68 %
DLCO cor: 15.24 ml/min/mmHg
DLCO unc % pred: 68 %
DLCO unc: 15.24 ml/min/mmHg
FEF 25-75 Post: 2.97 L/sec
FEF 25-75 Pre: 2.72 L/sec
FEF2575-%Change-Post: 9 %
FEF2575-%Pred-Post: 192 %
FEF2575-%Pred-Pre: 176 %
FEV1-%Change-Post: 3 %
FEV1-%Pred-Post: 101 %
FEV1-%Pred-Pre: 98 %
FEV1-Post: 2.44 L
FEV1-Pre: 2.36 L
FEV1FVC-%Change-Post: 1 %
FEV1FVC-%Pred-Pre: 119 %
FEV6-%Change-Post: 1 %
FEV6-%Pred-Post: 88 %
FEV6-%Pred-Pre: 87 %
FEV6-Post: 2.85 L
FEV6-Pre: 2.81 L
FEV6FVC-%Pred-Post: 108 %
FEV6FVC-%Pred-Pre: 108 %
FVC-%Change-Post: 1 %
FVC-%Pred-Post: 82 %
FVC-%Pred-Pre: 80 %
FVC-Post: 2.85 L
FVC-Pre: 2.81 L
Post FEV1/FVC ratio: 86 %
Post FEV6/FVC ratio: 100 %
Pre FEV1/FVC ratio: 84 %
Pre FEV6/FVC Ratio: 100 %
RV % pred: 77 %
RV: 2.07 L
TLC % pred: 74 %
TLC: 4.95 L

## 2021-11-24 LAB — COMPREHENSIVE METABOLIC PANEL
ALT: 14 U/L (ref 0–53)
AST: 19 U/L (ref 0–37)
Albumin: 4.1 g/dL (ref 3.5–5.2)
Alkaline Phosphatase: 46 U/L (ref 39–117)
BUN: 35 mg/dL — ABNORMAL HIGH (ref 6–23)
CO2: 30 mEq/L (ref 19–32)
Calcium: 9.8 mg/dL (ref 8.4–10.5)
Chloride: 106 mEq/L (ref 96–112)
Creatinine, Ser: 1.54 mg/dL — ABNORMAL HIGH (ref 0.40–1.50)
GFR: 40.9 mL/min — ABNORMAL LOW (ref >60.00)
Glucose, Bld: 129 mg/dL — ABNORMAL HIGH (ref 70–99)
Potassium: 4.9 mEq/L (ref 3.5–5.1)
Sodium: 141 mEq/L (ref 135–145)
Total Bilirubin: 0.4 mg/dL (ref 0.2–1.2)
Total Protein: 7.2 g/dL (ref 6.0–8.3)

## 2021-11-24 NOTE — Progress Notes (Signed)
Full PFT performed today. °

## 2021-11-24 NOTE — Patient Instructions (Signed)
Full PFT performed today. °

## 2021-11-24 NOTE — Patient Instructions (Signed)
We will check labs today  We will have our pharmacy team reach out to you in regards to antifibrotic medications  We will check your oxygen levels overnight while sleeping  Follow up in 3 months

## 2021-11-24 NOTE — Progress Notes (Signed)
Synopsis: Referred in December 2022 for shortness of breath by Cheryll Cockayne, MD  Subjective:   PATIENT ID: [name] GENDER: male DOB: 08/11/1934, MRN: [id number]  HPI  Chief Complaint  Patient presents with   Follow-up    F/U after PFT. States he has been doing well since last visit.    Jaxstin Zenisek is an 88 year old man, never smoker with DM II, CAD, complete heart block s/p pacemaker, CKDIII, chronic DVT and hypertension who returns to pulmonary clinic for shortness of breath.   HRCT Chest scan shows widespread areas of mild ground-glass attenuation, septal thickening, subpleural reticulation, septal thickening, traction bronchiectasis and mild honeycombing with craniocaudal gradient and progression from prior scan on 11/2009. These findings are consistent with usual interstitial pneumonia.  Echo 11/08/21 showed normal LV EF 55-60% and grade I diastolic dysfunction. RV systolic function is mildly reduced, with normal size and PASP.  He has been heating the house with a water stove which he loads with wood to burn to heat the water that then is used to heat his home and water. He has been using this source of heat since the 1980s and is exposed to smoke when loading the furnace.   OV 10/11/21 Patient reports progressive exertional dyspnea over the last 1 year.  He notices it mainly when walking up the steps in his home.  He denies any fatigue associated with the dyspnea.  He is able to rest and recover and then continue on with any further exertional activity.  He denies any chest pain with the shortness of breath.  He denies any changes in lower extremity edema as he has chronic left lower extremity edema due to a chronic DVT.  He was seen by cardiology 08/17/21 with no complaints of dyspnea documented at that time as he did not bring this to their attention. Note reviewed by Dr. Lawerance Bach, PCP, from 10/03/21 which noted concern for chronic dyspnea with exertion and dry crackles on  exam bilaterally.   Patient denies any cough or wheezing with the shortness of breath.  He denies any nighttime awakenings with shortness of breath.  He is a never smoker.  Denies any significant secondhand smoke exposure.  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  He denies any significant dust or chemical exposures throughout his career.  He denies any significant heartburn or reflux symptoms.  Past Medical History:  Diagnosis Date   Colonic polyp 2011 last colo   colo q 64yr - [facility]    Coronary artery disease 02/2009   severe left main and three-vessel, CABG   DEGENERATIVE JOINT DISEASE    Diverticulosis of colon    DIVERTICULOSIS, COLON    History of kidney stones    HYPERLIPIDEMIA-MIXED    HYPERTENSION, BENIGN    LBP (low back pain)    s/p decompression 8/14; ESI - [place]; RFA x 3 early 2015   Pacemaker    a. 05/2017: St. Jude (serial number [id number]) pacemaker   Symptomatic bradycardia    a. s/p St. Jude (serial number [id number]) pacemaker 06/16/17   Type II or unspecified type diabetes mellitus without mention of complication, not stated as uncontrolled    VITAMIN D DEFICIENCY      Family History  Problem Relation Age of Onset   Heart failure Mother        died age 10   Heart failure Father        died age  96   Diabetes Other    Heart disease Neg Hx      Social History   Socioeconomic History   Marital status: Married    Spouse name: Not on file   Number of children: Not on file   Years of education: Not on file   Highest education level: Not on file  Occupational History   Not on file  Tobacco Use   Smoking status: Never   Smokeless tobacco: Never   Tobacco comments:    Married, 3 grown children. Retired 04/2002 from [employer] work  Scientific laboratory technician Use: Never used  Substance and Sexual Activity   Alcohol use: Yes    Comment: RARE   Drug use: No   Sexual activity: Not on file   Other Topics Concern   Not on file  Social History Narrative   Not on file   Social Determinants of Health   Financial Resource Strain: Low Risk    Difficulty of Paying Living Expenses: Not hard at all  Food Insecurity: No Food Insecurity   Worried About Charity fundraiser in the Last Year: Never true   [place] in the Last Year: Never true  Transportation Needs: No Transportation Needs   Lack of Transportation (Medical): No   Lack of Transportation (Non-Medical): No  Physical Activity: Sufficiently Active   Days of Exercise per Week: 5 days   Minutes of Exercise per Session: 30 min  Stress: No Stress Concern Present   Feeling of Stress : Not at all  Social Connections: Moderately Integrated   Frequency of Communication with Friends and Family: More than three times a week   Frequency of Social Gatherings with Friends and Family: Once a week   Attends Religious Services: Never   Marine scientist or Organizations: Yes   Attends Music therapist: 1 to 4 times per year   Marital Status: Married  Human resources officer Violence: Not At Risk   Fear of Current or Ex-Partner: No   Emotionally Abused: No   Physically Abused: No   Sexually Abused: No     Allergies  Allergen Reactions   Penicillins Other (See Comments)    Taste buds     Outpatient Medications Prior to Visit  Medication Sig Dispense Refill   aspirin EC 81 MG tablet Take 81 mg by mouth daily.     atorvastatin (LIPITOR) 20 MG tablet Take 1 tablet (20 mg total) by mouth daily. TAKE 1 TABLET DAILY AT     6:00PM 90 tablet 1   carvedilol (COREG) 6.25 MG tablet TAKE 1 TABLET TWICE DAILY  WITH MEALS 180 tablet 1   cholecalciferol (VITAMIN D) 1000 UNITS tablet Take 1,000 Units by mouth daily.     HYDROcodone-acetaminophen (NORCO/VICODIN) 5-325 MG tablet Take 1 tablet by mouth every 6 (six) hours as needed for moderate pain.     metFORMIN (GLUCOPHAGE-XR) 500 MG 24 hr tablet TAKE 1 TABLET TWICE A DAY  (Patient taking differently: Take 500 mg by mouth in the morning and at bedtime.) 180 tablet 3   Multiple Vitamin (MULTIVITAMIN WITH MINERALS) TABS tablet Take 1 tablet by mouth daily.     niacin (NIASPAN) 1000 MG CR tablet TAKE 1 TABLET AT BEDTIME (Patient taking differently: Take 1,000 mg by mouth at bedtime.) 90 tablet 3   warfarin (JANTOVEN) 5 MG tablet TAKE AS DIRECTED BY THE    COUMADIN CLINIC (Patient taking differently: Take 2.5-5 mg by mouth  See admin instructions. TAKE AS DIRECTED BY THE COUMADIN CLINIC; 5 mg Mon Wed and Fri, and 2.5 mg all other days) 75 tablet 3   imiquimod (ALDARA) 5 % cream SMARTSIG:Sparingly Topical Every Night     No facility-administered medications prior to visit.    Review of Systems  Constitutional:  Negative for chills, fever, malaise/fatigue and weight loss.  HENT:  Negative for congestion, sinus pain and sore throat.   Eyes: Negative.   Respiratory:  Positive for shortness of breath. Negative for cough, hemoptysis, sputum production and wheezing.   Cardiovascular:  Negative for chest pain, palpitations, orthopnea, claudication and leg swelling.  Gastrointestinal:  Negative for abdominal pain, heartburn, nausea and vomiting.  Genitourinary: Negative.   Musculoskeletal:  Negative for joint pain and myalgias.  Skin:  Negative for rash.  Neurological:  Negative for weakness.  Endo/Heme/Allergies: Negative.   Psychiatric/Behavioral: Negative.       Objective:   Vitals:   11/24/21 1153  BP: 118/72  Pulse: 83  SpO2: 100%  Weight: 180 lb (81.6 kg)  Height: 5\' 8"  (1.727 m)    Physical Exam Constitutional:      General: He is not in acute distress. HENT:     Head: Normocephalic and atraumatic.  Eyes:     Extraocular Movements: Extraocular movements intact.     Conjunctiva/sclera: Conjunctivae normal.     Pupils: Pupils are equal, round, and reactive to light.  Cardiovascular:     Rate and Rhythm: Normal rate and regular rhythm.     Pulses:  Normal pulses.     Heart sounds: Normal heart sounds. No murmur heard. Pulmonary:     Breath sounds: Rales (bibasilar) present. No wheezing or rhonchi.  Musculoskeletal:     Right lower leg: No edema.     Left lower leg: Edema present.  Skin:    General: Skin is warm and dry.  Neurological:     General: No focal deficit present.     Mental Status: He is alert.  Psychiatric:        Mood and Affect: Mood normal.        Behavior: Behavior normal.        Thought Content: Thought content normal.        Judgment: Judgment normal.   CBC    Component Value Date/Time   WBC 5.6 10/03/2021 0923   RBC 3.36 (L) 10/03/2021 0923   HGB 11.4 (L) 10/03/2021 0923   HCT 34.2 (L) 10/03/2021 0923   PLT 176.0 10/03/2021 0923   MCV 101.6 (H) 10/03/2021 0923   MCH 32.5 06/16/2017 0546   MCHC 33.4 10/03/2021 0923   RDW 13.8 10/03/2021 0923   LYMPHSABS 1.5 10/03/2021 0923   MONOABS 0.8 10/03/2021 0923   EOSABS 0.3 10/03/2021 0923   BASOSABS 0.0 10/03/2021 0923   BMP Latest Ref Rng & Units 10/03/2021 05/06/2021 04/01/2021  Glucose 70 - 99 mg/dL 216(H) 141(H) 277(H)  BUN 6 - 23 mg/dL 28(H) 39(H) 44(H)  Creatinine 0.40 - 1.50 mg/dL 1.57(H) 1.74(H) 1.56(H)  Sodium 135 - 145 mEq/L 140 139 139  Potassium 3.5 - 5.1 mEq/L 4.8 4.6 4.6  Chloride 96 - 112 mEq/L 106 105 105  CO2 19 - 32 mEq/L 28 27 26   Calcium 8.4 - 10.5 mg/dL 9.2 9.4 9.3   Chest imaging: HRCT Chest 10/19/21 1. The appearance of the lungs is compatible with interstitial lung disease, with a spectrum of findings considered diagnostic of usual interstitial pneumonia (UIP) per current ATS guidelines, as  detailed above. 2. Aortic atherosclerosis, in addition to left main and three-vessel coronary artery disease. Status post median sternotomy for CABG including LIMA to the LAD. 3. There are calcifications of the aortic valve. Echocardiographic correlation for evaluation of potential valvular dysfunction may be warranted if clinically  indicated. Small hiatal hernia.  CT Abd/Pelvis w/ contrast 05/06/21 Lower chest: Mild scarring at both lung bases. There is mild cardiomegaly status post median sternotomy. Pacemaker leads are seen within the right atrium and right ventricle. No significant pleural or pericardial effusion. There is a small hiatal hernia.  PFT: PFT Results Latest Ref Rng & Units 11/24/2021  FVC-Pre L 2.81  FVC-Predicted Pre % 80  FVC-Post L 2.85  FVC-Predicted Post % 82  Pre FEV1/FVC % % 84  Post FEV1/FCV % % 86  FEV1-Pre L 2.36  FEV1-Predicted Pre % 98  FEV1-Post L 2.44  DLCO uncorrected ml/min/mmHg 15.24  DLCO UNC% % 68  DLCO corrected ml/min/mmHg 15.24  DLCO COR %Predicted % 68  DLVA Predicted % 84  TLC L 4.95  TLC % Predicted % 74  RV % Predicted % 77    Labs:  Path:  Echo 11/08/21 showed normal LV EF 55-60% and grade I diastolic dysfunction. RV systolic function is mildly reduced, with normal size and PASP.  Heart Catheterization:  Assessment & Plan:   Pulmonary fibrosis (HCC)  Discussion: Holbert Bentley is an 88 year old man, never smoker with DM II, CAD, complete heart block s/p pacemaker, CKDIII, chronic DVT and hypertension who returns to pulmonary clinic for shortness of breath.    He has pulmonary fibrosis as noted by his HRCT Chest. He has mild restrictive defect and mild diffusion defect based on pulmonary function tests.   On simple walk in clinic today, his SpO2 remained in the high 90s so he does not require supplemental oxygen with exertion at this time. We will check overnight oximitry test.   He will be referred to our pharmacy team for further discussion on antifibrotic therapy. He is interested in starting treatment. We will check CMP today in anticipation of starting therapy.  He is to follow-up in 3 months.  Freda Jackson, MD [facility] Office: [phone]   Current Outpatient Medications:    aspirin EC 81 MG tablet, Take 81 mg by  mouth daily., Disp: , Rfl:    atorvastatin (LIPITOR) 20 MG tablet, Take 1 tablet (20 mg total) by mouth daily. TAKE 1 TABLET DAILY AT     6:00PM, Disp: 90 tablet, Rfl: 1   carvedilol (COREG) 6.25 MG tablet, TAKE 1 TABLET TWICE DAILY  WITH MEALS, Disp: 180 tablet, Rfl: 1   cholecalciferol (VITAMIN D) 1000 UNITS tablet, Take 1,000 Units by mouth daily., Disp: , Rfl:    HYDROcodone-acetaminophen (NORCO/VICODIN) 5-325 MG tablet, Take 1 tablet by mouth every 6 (six) hours as needed for moderate pain., Disp: , Rfl:    metFORMIN (GLUCOPHAGE-XR) 500 MG 24 hr tablet, TAKE 1 TABLET TWICE A DAY (Patient taking differently: Take 500 mg by mouth in the morning and at bedtime.), Disp: 180 tablet, Rfl: 3   Multiple Vitamin (MULTIVITAMIN WITH MINERALS) TABS tablet, Take 1 tablet by mouth daily., Disp: , Rfl:    niacin (NIASPAN) 1000 MG CR tablet, TAKE 1 TABLET AT BEDTIME (Patient taking differently: Take 1,000 mg by mouth at bedtime.), Disp: 90 tablet, Rfl: 3   warfarin (JANTOVEN) 5 MG tablet, TAKE AS DIRECTED BY THE    COUMADIN CLINIC (Patient taking differently: Take  2.5-5 mg by mouth See admin instructions. TAKE AS DIRECTED BY THE COUMADIN CLINIC; 5 mg Mon Wed and Fri, and 2.5 mg all other days), Disp: 75 tablet, Rfl: 3

## 2021-11-28 ENCOUNTER — Encounter: Payer: Self-pay | Admitting: Internal Medicine

## 2021-11-29 NOTE — Progress Notes (Signed)
Patient scheduled for antifibrotics discussion on 12/01/21 with pharmacy team. He has been advised to bring income documents for household (him and his wife).  Chesley Mires, PharmD, MPH, BCPS Clinical Pharmacist (Rheumatology and Pulmonology)

## 2021-12-01 ENCOUNTER — Other Ambulatory Visit: Payer: Self-pay

## 2021-12-01 ENCOUNTER — Ambulatory Visit (INDEPENDENT_AMBULATORY_CARE_PROVIDER_SITE_OTHER): Payer: Medicare Other | Admitting: Pharmacist

## 2021-12-01 ENCOUNTER — Other Ambulatory Visit (HOSPITAL_COMMUNITY): Payer: Self-pay

## 2021-12-01 ENCOUNTER — Telehealth: Payer: Self-pay | Admitting: Pharmacist

## 2021-12-01 DIAGNOSIS — Z7189 Other specified counseling: Secondary | ICD-10-CM

## 2021-12-01 DIAGNOSIS — Z5181 Encounter for therapeutic drug level monitoring: Secondary | ICD-10-CM

## 2021-12-01 NOTE — Patient Instructions (Signed)
We will start Esbriet/pirfenidone benefits investigation and be in touch with you once we get update.

## 2021-12-01 NOTE — Telephone Encounter (Signed)
Patient has two insurance plans populate in eligiblity check.  Submitted a Prior Authorization request to [facility] for PIRFENIDONE via CoverMyMeds. Will update once we receive a response. This plan pops up as commercial. If this is the case, patient eligible for copay card for generic once approved through insurance  Key: [id number]  Submitted a Prior Authorization request to [facility] for PIRFENIDONE via CoverMyMeds. Will update once we receive a response. Of note, this card shows Part B drug coverage only.  Key: [id number]   Patient portion of [employer] pt assistance application completed today. Provider portion placed in Dr. Lanora Manis mailbox.  Chesley Mires, PharmD, MPH, BCPS Clinical Pharmacist (Rheumatology and Pulmonology)

## 2021-12-01 NOTE — Progress Notes (Signed)
Subjective:   Patient presents today to [facility] to see pharmacy team for Ofev and Rogers counseling.   Patient was last seen and referred by Dr. Erin Fulling on 11/24/21. Pertinent past medical history includes CAD s/p bypass surgery, complete heart block with pacemaker, DVT, HTN, T2DM, CKD stage 3, dyslipidemia. He is naive to antifibrotics and new diagnosis of pulmonary fibrosis.  History of CAD: Yes History of DVT: yes Current anticoagulant use: Yes - warfarin History of HTN: Yes  History of elevated LFTs: No History of diarrhea, nausea, vomiting: No. Patient does report constipation secondary to opioid use.  Objective: Allergies  Allergen Reactions   Penicillins Other (See Comments)    Taste buds    Outpatient Encounter Medications as of 12/01/2021  Medication Sig Note   Acetaminophen (TYLENOL 8 HOUR PO) Take 1,000 mg by mouth 3 (three) times daily.    aspirin EC 81 MG tablet Take 81 mg by mouth daily.    atorvastatin (LIPITOR) 20 MG tablet Take 1 tablet (20 mg total) by mouth daily. TAKE 1 TABLET DAILY AT     6:00PM    carvedilol (COREG) 6.25 MG tablet TAKE 1 TABLET TWICE DAILY  WITH MEALS    cholecalciferol (VITAMIN D) 1000 UNITS tablet Take 1,000 Units by mouth daily.    HYDROcodone-acetaminophen (NORCO/VICODIN) 5-325 MG tablet Take 1 tablet by mouth every 6 (six) hours as needed for moderate pain. 05/13/2021: ON HOLD    metFORMIN (GLUCOPHAGE-XR) 500 MG 24 hr tablet TAKE 1 TABLET TWICE A DAY (Patient taking differently: Take 500 mg by mouth in the morning and at bedtime.)    Multiple Vitamin (MULTIVITAMIN WITH MINERALS) TABS tablet Take 1 tablet by mouth daily.    niacin (NIASPAN) 1000 MG CR tablet TAKE 1 TABLET AT BEDTIME (Patient taking differently: Take 1,000 mg by mouth at bedtime.)    warfarin (JANTOVEN) 5 MG tablet TAKE AS DIRECTED BY THE    COUMADIN CLINIC (Patient taking differently: Take 2.5-5 mg by mouth See admin instructions. TAKE AS DIRECTED BY THE COUMADIN  CLINIC; 5 mg Mon Wed and Fri, and 2.5 mg all other days)    No facility-administered encounter medications on file as of 12/01/2021.     Immunization History  Administered Date(s) Administered   Influenza Split 09/26/2012   Influenza, High Dose Seasonal PF 07/30/2018, 07/02/2019, 09/06/2020   Influenza, Seasonal, Injecte, Preservative Fre 07/23/2013   Influenza-Unspecified 09/24/2014, 07/24/2015, 08/04/2016, 08/13/2017, 08/09/2021   PFIZER(Purple Top)SARS-COV-2 Vaccination 12/01/2019, 12/24/2019, 09/06/2020   PNEUMOCOCCAL CONJUGATE-20 10/03/2021   Pneumococcal Conjugate-13 09/22/2015   Pneumococcal-Unspecified 09/25/2011   Td 11/07/2002, 03/26/2013    PFT's TLC  Date Value Ref Range Status  11/24/2021 4.95 L Final    CMP     Component Value Date/Time   NA 141 11/24/2021 1250   K 4.9 11/24/2021 1250   CL 106 11/24/2021 1250   CO2 30 11/24/2021 1250   GLUCOSE 129 (H) 11/24/2021 1250   BUN 35 (H) 11/24/2021 1250   CREATININE 1.54 (H) 11/24/2021 1250   CALCIUM 9.8 11/24/2021 1250   PROT 7.2 11/24/2021 1250   ALBUMIN 4.1 11/24/2021 1250   AST 19 11/24/2021 1250   ALT 14 11/24/2021 1250   ALKPHOS 46 11/24/2021 1250   BILITOT 0.4 11/24/2021 1250   GFRNONAA 50 (L) 06/16/2017 0546   GFRAA 58 (L) 06/16/2017 0546    CBC    Component Value Date/Time   WBC 5.6 10/03/2021 0923   RBC 3.36 (L) 10/03/2021 0923   HGB 11.4 (  L) 10/03/2021 0923   HCT 34.2 (L) 10/03/2021 0923   PLT 176.0 10/03/2021 0923   MCV 101.6 (H) 10/03/2021 0923   MCH 32.5 06/16/2017 0546   MCHC 33.4 10/03/2021 0923   RDW 13.8 10/03/2021 0923   LYMPHSABS 1.5 10/03/2021 0923   MONOABS 0.8 10/03/2021 0923   EOSABS 0.3 10/03/2021 0923   BASOSABS 0.0 10/03/2021 0923    LFT's Hepatic Function Latest Ref Rng & Units 11/24/2021 10/03/2021 04/01/2021  Total Protein 6.0 - 8.3 g/dL 7.2 6.4 6.6  Albumin 3.5 - 5.2 g/dL 4.1 3.8 4.0  AST 0 - 37 U/L 19 24 18   ALT 0 - 53 U/L 14 21 27   Alk Phosphatase 39 - 117 U/L 46 57 55   Total Bilirubin 0.2 - 1.2 mg/dL 0.4 0.4 0.6  Bilirubin, Direct 0.0 - 0.3 mg/dL - - -    HRCT (10/20/21) - appearance of the lungs is compatible with interstitial lung disease, with a spectrum of findings considered diagnostic of usual interstitial pneumonia (UIP)   Assessment and Plan  Esbriet and Ofev Medication Management Thoroughly counseled patient on the efficacy, mechanism of action, dosing, administration, adverse effects, and monitoring parameters of Ofev and Esbriet. Patient verbalized understanding. Patient education handout provided for both medications  Patient has history of CAD and DVT - Ofev is generally considered second-line due additionally to small cardiac risk  Reviewed the following similarities in monitoring and goals of therapy between Ofev and Esbriet/pirfenidone:  Goals of Therapy: Will not stop or reverse the progression of ILD. It will slow the progression of ILD.   Monitoring: Monitor for diarrhea, nausea and vomiting, GI perforation, hepatotoxicity  Monitor LFTs - baseline, monthly for first 6 months, then every 3 months routinely CBC w differential at baseline and every 3 months routinely  Reviewed the below with Ofev: Goals of Therapy: Inhibits tyrosine kinase inhibitors which slow the fibrosis/progression of ILD. Significant reduction in the rate of disease progression was observed after treatment (61.1% [before] vs 33.3% [after], P?=?0.008) over 42 weeks.  Dosing: 150 mg (one capsule) by mouth twice daily (approx 12 hours apart). Discussed taking with food approximately 12 hours apart. Discussed that capsule should not be crushed or split. Discussed that dose could be reduced to 127m twice daily if unable to tolerate 1565mtwice daily  Adverse Effects: Nausea, vomiting, diarrhea (2 in 3 patients) appetite loss, weight loss - management of diarrhea with loperamide discussed including max use of 48 hours and max of 8 capsules per day. Abdominal pain (up  to 1 in 5 patients) Nasopharyngitis (13%), UTI (6%) Risk of thrombosis (3%) and acute MI (2%) Reviewed theoretical increased risk of bleeding (1-2%) - patient currently takes warfarin Hypertension (5%) Dizziness Fatigue (10%)  Review the below with Esbriet: Goals of Therapy: Mechanism of action on pulmonary fibrosis relatively unknown  Dosing: Starting dose will be Esbriet 267 mg 1 tablet three times daily for 7 days, then 2 tablets three times daily for 7 days, then 3 tablets three times daily.  Maintenance dose will be 801 mg 1 tablet three times daily if tolerated.  Stressed the importance of taking with meals and space at least 5-6 hours apart to minimize stomach upset.   Adverse Effects: Nausea, vomiting, diarrhea, weight loss Abdominal pain GERD Sun sensitivity/rash - patient advised to wear sunscreen when exposed to sunlight Dizziness Fatigue  Medication Reconciliation A drug regimen assessment was performed, including review of allergies, interactions, disease-state management, dosing and immunization history. Medications were reviewed with  the patient, including name, instructions, indication, goals of therapy, potential side effects, importance of adherence, and safe use.  Anticoagulant use: Yes - warfarin managed by Dr. Rockey Situ with cardiology  Immunizations Patient has received 3 COVID19 vaccines. Patient is UTD on pneumonia and influenza vaccine. He is eligible for zoster vaccine.  After review of his past medical history specifically cardiac history and current warfarin use, we will start Esbriet BIV.  Patient was amenable to either. He is aware that if he cannot tolerate Esbriet, we can switch him to Ofev in the future.  This appointment required 50 minutes of patient care (this includes precharting, chart review, review of results, face-to-face care, etc.).  Thank you for involving pharmacy to assist in providing this patient's care.   [name] Saliva, PharmD, MPH,  BCPS Clinical Pharmacist (Rheumatology and Pulmonology)

## 2021-12-06 ENCOUNTER — Other Ambulatory Visit (HOSPITAL_COMMUNITY): Payer: Self-pay

## 2021-12-06 MED ORDER — PIRFENIDONE 267 MG PO TABS: 801.0000 mg | ORAL_TABLET | Freq: Three times a day (TID) | ORAL | 1 refills | Status: DC

## 2021-12-06 MED ORDER — PIRFENIDONE 267 MG PO TABS: ORAL_TABLET | ORAL | 0 refills | Status: DC

## 2021-12-06 NOTE — Telephone Encounter (Signed)
Received notification from [facility] regarding a prior authorization for PIRFENIDONE. Authorization has been APPROVED from 12/01/21 to 12/01/22.   Unable to run test claim since patient must fill [facility] (pulmonary fibrosis team): [phone]  Authorization # [id number]  Patient is eligible for copay card -  [employer] copay card for pirfenidone: BIN: [id number] Group: [id number] ID: [id number]  Amneal copay card for pirfenidone (max $500 per monthly fill) BIN: [id number] Group: [id number] PCN: ACR ID: [id number]  Received a fax regarding Prior Authorization from [facility] for PIRFENIDONE. Authorization has been DENIED because this plan only covers Part A and B drugs.  ATC patient but unable to reach - left VM requesting return call. [facility]. Will ATC patient later this week  Chesley Mires, PharmD, MPH, BCPS Clinical Pharmacist (Rheumatology and Pulmonology)

## 2021-12-08 NOTE — Addendum Note (Signed)
Addended by: Walt Geathers S on: 12/08/2021 08:40 AM ° ° Modules accepted: Level of Service ° °

## 2021-12-12 ENCOUNTER — Other Ambulatory Visit (HOSPITAL_COMMUNITY): Payer: Self-pay

## 2021-12-14 ENCOUNTER — Other Ambulatory Visit: Payer: Self-pay

## 2021-12-14 ENCOUNTER — Ambulatory Visit (INDEPENDENT_AMBULATORY_CARE_PROVIDER_SITE_OTHER): Payer: Medicare Other

## 2021-12-14 DIAGNOSIS — I82409 Acute embolism and thrombosis of unspecified deep veins of unspecified lower extremity: Secondary | ICD-10-CM | POA: Diagnosis not present

## 2021-12-14 DIAGNOSIS — Z5181 Encounter for therapeutic drug level monitoring: Secondary | ICD-10-CM

## 2021-12-14 DIAGNOSIS — I442 Atrioventricular block, complete: Secondary | ICD-10-CM

## 2021-12-14 LAB — CUP PACEART REMOTE DEVICE CHECK
Battery Remaining Longevity: 76 mo
Battery Remaining Percentage: 61 %
Battery Voltage: 2.99 V
Brady Statistic AP VP Percent: 8.8 %
Brady Statistic AP VS Percent: 1 %
Brady Statistic AS VP Percent: 91 %
Brady Statistic AS VS Percent: 1 %
Brady Statistic RA Percent Paced: 8.4 %
Brady Statistic RV Percent Paced: 99 %
Date Time Interrogation Session: 20230222052045
Implantable Lead Implant Date: 20180825
Implantable Lead Implant Date: 20180825
Implantable Lead Location: 753859
Implantable Lead Location: 753860
Implantable Pulse Generator Implant Date: 20180825
Lead Channel Impedance Value: 460 Ohm
Lead Channel Impedance Value: 630 Ohm
Lead Channel Pacing Threshold Amplitude: 0.5 V
Lead Channel Pacing Threshold Amplitude: 0.625 V
Lead Channel Pacing Threshold Pulse Width: 0.5 ms
Lead Channel Pacing Threshold Pulse Width: 0.5 ms
Lead Channel Sensing Intrinsic Amplitude: 10.2 mV
Lead Channel Sensing Intrinsic Amplitude: 4.5 mV
Lead Channel Setting Pacing Amplitude: 0.875
Lead Channel Setting Pacing Amplitude: 2 V
Lead Channel Setting Pacing Pulse Width: 0.5 ms
Lead Channel Setting Sensing Sensitivity: 4 mV
Pulse Gen Model: 2272
Pulse Gen Serial Number: 8939923

## 2021-12-14 LAB — POCT INR: INR: 2.6 (ref 2.0–3.0)

## 2021-12-14 NOTE — Telephone Encounter (Addendum)
Attempted to call patient in regard to Esbriet, no response. Will attempt at later time.  Asencion Gowda, MBA PharmDCandidate [facility] [place] of 2023  Provided copay card information for Sandoz and Amneal copay card information to [facility]  Chesley Mires, PharmD, MPH, BCPS Clinical Pharmacist (Rheumatology and Pulmonology)

## 2021-12-14 NOTE — Patient Instructions (Signed)
-   continue warfarin dosage of 1/2 tablet (2.5 mg) daily except 1 tablet (5 mg) on WEDNESDAYS - Recheck INR in 5 weeks Keep your green leafy vegetables- pick a day (or days)  to have your greens and have them every week

## 2021-12-14 NOTE — Telephone Encounter (Signed)
Patient returned call. Patient states he has shipment scheduled for Friday 12/16/21. He states he received email but no copay was listed. I urged him to reach out to [facility] and ensure he has no copay and if he does to request that they process through copay card before receiving shipment as it may not be able to be re-processed after he receives shipment. He verbalized understanding.  He had no further questions about medication.  F/u with Dr. Erin Fulling scheduled for 02/10/22. Future lab order for hepatic function panel placed today  [name] Saliva, PharmD, MPH, BCPS Clinical Pharmacist (Rheumatology and Pulmonology)

## 2021-12-19 NOTE — Telephone Encounter (Signed)
Received fax from [facility] requesting clarification on whether pt would require initial titration of Pirfenidone Rx. Called and advised rep that two separate Rx's were sent in, one for loading dose and one for maintenance. Transferred to Rph who states that both Rx's were received written for quantity of #270, hence the confusion. Confirmed that Rx with correct instructions for titration dose was received, and provided verbal confirmation to update quantity to #207 tabs per 30 days for initial loading dose. Rph confirms for me that there is no issues for f/u maintenance dose and that it will be filled for #270 tabs as 30 day supply. Nothing further needed at this time.

## 2021-12-21 NOTE — Progress Notes (Signed)
Remote pacemaker transmission.   

## 2021-12-25 ENCOUNTER — Encounter: Payer: Self-pay | Admitting: Internal Medicine

## 2021-12-27 NOTE — Progress Notes (Signed)
? ? ?Subjective:  ? ? [name], male    DOB: February 03, 1934, 88 y.o.   MRN: [id number] ? ?This visit occurred during the SARS-CoV-2 public health emergency.  Safety protocols were in place, including screening questions prior to the visit, additional usage of staff PPE, and extensive cleaning of exam room while observing appropriate contact time as indicated for disinfecting solutions. ? ? ? ?HPI ?Andrew [name] is here for  ?Chief Complaint  ?Patient presents with  ? Extremity Weakness  ?  Weakness in legs  ? ? ?He has chronic back pain and has seen orthopedics.  He has had injections.  He has known facet arthritis and degenerative disc disease with some disc herniations.  Getting injections about every 3 months at [facility].   ? ?He is losing muscle mass in his legs.  He needs to use his arms to help get him up from a chair.  This has been coming on for about one year.  He is huffing and puffing going up stairs.  He thinks his problem is more muscle than breathing.  He is seeing pulmonary.   ? ? ?No muscle pain.  He did PT a couple of years ago  - it was brutal and he did not enjoy it and did feel that it helped.  His balance is not good.  He has noticed he drags his toes at times and has to be careful to pick up his feet good.   ? ?Medications and allergies reviewed with patient and updated if appropriate. ? ?Current Outpatient Medications on File Prior to Visit  ?Medication Sig Dispense Refill  ? Acetaminophen (TYLENOL 8 HOUR PO) Take 1,000 mg by mouth 3 (three) times daily.    ? aspirin EC 81 MG tablet Take 81 mg by mouth daily.    ? atorvastatin (LIPITOR) 20 MG tablet Take 1 tablet (20 mg total) by mouth daily. TAKE 1 TABLET DAILY AT     6:00PM 90 tablet 1  ? carvedilol (COREG) 6.25 MG tablet TAKE 1 TABLET TWICE DAILY  WITH MEALS 180 tablet 1  ? cholecalciferol (VITAMIN D) 1000 UNITS tablet Take 1,000 Units by mouth daily.    ? clindamycin (CLEOCIN) 300 MG capsule Take 300 mg by mouth 3 (three)  times daily.    ? HYDROcodone-acetaminophen (NORCO/VICODIN) 5-325 MG tablet Take 1 tablet by mouth every 6 (six) hours as needed for moderate pain.    ? metFORMIN (GLUCOPHAGE-XR) 500 MG 24 hr tablet TAKE 1 TABLET TWICE A DAY (Patient taking differently: Take 500 mg by mouth in the morning and at bedtime.) 180 tablet 3  ? Multiple Vitamin (MULTIVITAMIN WITH MINERALS) TABS tablet Take 1 tablet by mouth daily.    ? niacin (NIASPAN) 1000 MG CR tablet TAKE 1 TABLET AT BEDTIME (Patient taking differently: Take 1,000 mg by mouth at bedtime.) 90 tablet 3  ? Pirfenidone (ESBRIET) 267 MG TABS Take 1 tab three times daily for 7 days, then 2 tabs three times daily for 7 days, then 3 tabs three times daily thereafter. 270 tablet 0  ? Pirfenidone (ESBRIET) 267 MG TABS Take 3 tablets (801 mg total) by mouth with breakfast, with lunch, and with evening meal. 270 tablet 1  ? TURMERIC CURCUMIN PO Take 2,000 mg by mouth in the morning and at bedtime.    ? warfarin (JANTOVEN) 5 MG tablet TAKE AS DIRECTED BY THE    C[facility]MADIN CLINIC (Patient taking differently: Take 2.5-5 mg by mouth See admin instructions.  TAKE AS DIRECTED BY THE COUMADIN CLINIC; 5 mg Mon Wed and Fri, and 2.5 mg all other days) 75 tablet 3  ? ?No current facility-administered medications on file prior to visit.  ? ? ?Review of Systems  ?Constitutional:  Negative for fever.  ?Musculoskeletal:  Positive for back pain and gait problem.  ?Neurological:  Positive for weakness (b/l legs). Negative for dizziness, light-headedness and numbness (numbness in left leg).  ? ?   ?Objective:  ? ?Vitals:  ? 12/28/21 1553  ?BP: 120/80  ?Pulse: 82  ?Temp: 98 ?F (36.7 ?C)  ?SpO2: 97%  ? ?BP Readings from Last 3 Encounters:  ?12/28/21 120/80  ?11/24/21 118/72  ?10/11/21 116/72  ? ?Wt Readings from Last 3 Encounters:  ?12/28/21 177 lb (80.3 kg)  ?11/24/21 180 lb (81.6 kg)  ?10/11/21 180 lb (81.6 kg)  ? ?Body mass index is 26.91 kg/m?. ? ?  ?Physical Exam ?Constitutional:   ?   General: He  is not in acute distress. ?   Appearance: Normal appearance. He is not ill-appearing.  ?HENT:  ?   Head: Normocephalic.  ?Eyes:  ?   Conjunctiva/sclera: Conjunctivae normal.  ?Cardiovascular:  ?   Rate and Rhythm: Normal rate and regular rhythm.  ?   Heart sounds: Normal heart sounds. No murmur heard. ?Pulmonary:  ?   Effort: Pulmonary effort is normal. No respiratory distress.  ?   Breath sounds: Normal breath sounds. No wheezing or rales.  ?Musculoskeletal:  ?   Right lower leg: No edema.  ?   Left lower leg: No edema.  ?Skin: ?   General: Skin is warm and dry.  ?   Findings: No rash.  ?Neurological:  ?   Mental Status: He is alert and oriented to person, place, and time.  ?   Sensory: Sensory deficit (Slight decrease sensation in areas of the left leg) present.  ?   Motor: Weakness (Weakness bilateral lower extremities) present.  ?Psychiatric:     ?   Mood and Affect: Mood normal.  ? ?   ? ? ? ? ? ?Assessment & Plan:  ? ? ?See Problem List for Assessment and Plan of chronic medical problems.  ? ? ? ? ?

## 2021-12-28 ENCOUNTER — Ambulatory Visit (INDEPENDENT_AMBULATORY_CARE_PROVIDER_SITE_OTHER): Payer: Medicare Other | Admitting: Internal Medicine

## 2021-12-28 ENCOUNTER — Encounter: Payer: Self-pay | Admitting: Internal Medicine

## 2021-12-28 ENCOUNTER — Other Ambulatory Visit: Payer: Self-pay

## 2021-12-28 VITALS — BP 120/80 | HR 82 | Temp 98.0°F | Ht 68.0 in | Wt 177.0 lb

## 2021-12-28 DIAGNOSIS — R29898 Other symptoms and signs involving the musculoskeletal system: Secondary | ICD-10-CM

## 2021-12-28 DIAGNOSIS — R2689 Other abnormalities of gait and mobility: Secondary | ICD-10-CM | POA: Insufficient documentation

## 2021-12-28 DIAGNOSIS — I10 Essential (primary) hypertension: Secondary | ICD-10-CM

## 2021-12-28 NOTE — Assessment & Plan Note (Signed)
Subacute ?Started 1-2 years ago ?States mild bilateral leg weakness ?He does want to know what can be done to help this ?He deferred physical therapy-did that in the past at [facility] likely for his back and did not feel that it helped ?Would recommend him seeing a neurologist for better clarification of cause of his leg weakness so that we know how to help it ?Referral ordered for neurology ?

## 2021-12-28 NOTE — Patient Instructions (Addendum)
? ? ? ? ?  A referral was ordered for neurology.     Someone from that office will call you to schedule an appointment.  ? ? ?

## 2021-12-28 NOTE — Assessment & Plan Note (Signed)
Chronic ?Blood pressure well controlled ?Denies any lightheadedness/dizziness ?Continue Coreg 6.25 mg twice daily ?

## 2021-12-28 NOTE — Assessment & Plan Note (Signed)
Chronic ?Has been stating some poor balance for a while-at least a year or 2 ?Likely related to his bilateral leg weakness ?We will refer to neurology for further clarification of the cause of his leg weakness and poor balance ?He understands that this could be related to his chronic back issues ?Deferred physical therapy ?

## 2021-12-29 ENCOUNTER — Encounter: Payer: Self-pay | Admitting: Neurology

## 2021-12-29 NOTE — Telephone Encounter (Signed)
I called patient regarding pirfenidone shipment. He states he received shipment last week and is starting 2 tablets three times daily this week. He states he is tolerating without any side effects or issue thus far. ? ?Closing encounter - patient has f/u on 02/10/22 with Dr. Erin Fulling. ? ?[name] Saliva, PharmD, MPH, BCPS ?Clinical Pharmacist (Rheumatology and Pulmonology) ?

## 2022-01-13 ENCOUNTER — Telehealth: Payer: Self-pay | Admitting: Pulmonary Disease

## 2022-01-13 NOTE — Telephone Encounter (Signed)
ONO Results ?These let patient know patient he does not qualify for supplemental oxygen at night as he only spent 4 seconds less than 88% SPO2.  Otherwise for the 8 hours and 41 minutes of sleep he was greater than 90% SPO2. ? ?Thanks, ?JD ?

## 2022-01-16 NOTE — Telephone Encounter (Signed)
Called patient but he did not answer. Left message for him to call back.  

## 2022-01-17 NOTE — Telephone Encounter (Signed)
Patient is aware of results and voiced his understanding.  Nothing further needed.  

## 2022-01-18 ENCOUNTER — Ambulatory Visit (INDEPENDENT_AMBULATORY_CARE_PROVIDER_SITE_OTHER): Payer: Medicare Other

## 2022-01-18 DIAGNOSIS — I82409 Acute embolism and thrombosis of unspecified deep veins of unspecified lower extremity: Secondary | ICD-10-CM

## 2022-01-18 DIAGNOSIS — Z5181 Encounter for therapeutic drug level monitoring: Secondary | ICD-10-CM

## 2022-01-18 LAB — POCT INR: INR: 1.8 — AB (ref 2.0–3.0)

## 2022-01-18 NOTE — Patient Instructions (Signed)
-   take 1.5 tablets warfarin tonight, then ?- continue warfarin dosage of 1/2 tablet (2.5 mg) daily except 1 tablet (5 mg) on [facility] ?- Recheck INR in 4 weeks ?Keep your green leafy vegetables- pick a day (or days)  to have your greens and have them every week ?

## 2022-02-08 NOTE — Progress Notes (Addendum)
? ?NEUROLOGY CONSULTATION NOTE ? ?Andrew [name] ?MRN: [id number] ?DOB: 1934-01-23 ? ?Referring provider: Cheryll Cockayne, MD ?Primary care provider: Cheryll Cockayne, MD ? ?Reason for consult:  boor balance, weakness ? ?Assessment/Plan:  ? ?Generalized muscle weakness.  Except for in the left lower extremity, which is likely residual from prior lumbar nerve root injury, I do not appreciate any objective muscle weakness.  He really endorses generalized fatigue rather than muscle weakness and limitations related to his chronic back pain.   ? ?I do not suspect any primary neurologic disease but will check CK level.  Further recommendations pending results.   ?ADDENDUM:  CK 60.  No further recommendations. ? ? ?Subjective:  ?Andrew [name] is an 88 year old male with CAD, symptomatic bradycardia s/p St Jude pacemaker, DM II, HTN, HLD, pulmonary fibrosis, degenerative joint disease.  CT myelogram of lumbar spine from 03/17/2021 personally reviewed. ?  ?For about a year, he reports worsening generalized weakness.  He particularly notes more weakness in the arms and hands.  He has trouble starting a chainsaw.  He has chronic low balance and increased weakness in the arms and legs.  Feels weaker starting a chainsaw.  If he stands for more than 10 minutes, his back hurts and needs to sit down to rest.  He drags his toes and has to be conscientious to pick up his feet.  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  He did not respond well to physical therapy.  No associated numbness or pain.  If he walks for any extended period of time he feels tired but not muscle fatigue.  Denies double vision or trouble swallowing.  He has been on atorvastatin for several years.  He also has type 2 diabetes.  Most recent A1c from December was 7.3.   ? ?PAST MEDICAL HISTORY: ?Past Medical History:   ?Diagnosis Date  ? Colonic polyp 2011 last colo  ? colo q 19yr - [facility]   ? Coronary artery disease 02/2009  ? severe left main and three-vessel, CABG  ? DEGENERATIVE JOINT DISEASE   ? Diverticulosis of colon   ? DIVERTICULOSIS, COLON   ? History of kidney stones   ? HYPERLIPIDEMIA-MIXED   ? HYPERTENSION, BENIGN   ? LBP (low back pain)   ? s/p decompression 8/14; ESI - [place]; RFA x 3 early 2015  ? Pacemaker   ? a. 05/2017: St. Jude (serial number [id number]) pacemaker  ? Symptomatic bradycardia   ? a. s/p St. Jude (serial number [id number]) pacemaker 06/16/17  ? Type II or unspecified type diabetes mellitus without mention of complication, not stated as uncontrolled   ? VITAMIN D DEFICIENCY   ? ? ?PAST SURGICAL HISTORY: ?Past Surgical History:  ?Procedure Laterality Date  ? CARPAL TUNNEL RELEASE Right   ? CATARACT EXTRACTION, BILATERAL    ? R 07/07/13, L 07/21/13  ? CORONARY ARTERY BYPASS GRAFT  03/17/09  ? x 4  ? CYSTOSCOPY WITH STENT PLACEMENT Left 05/10/2021  ? Procedure: CYSTOSCOPY WITH LEFT  STENT PLACEMENT,LEFT RETROGRADE PYELOGRAM;  Surgeon: Crist Fat, MD;  Location: [facility];  Service: Urology;  Laterality: Left;  ? CYSTOSCOPY/URETEROSCOPY/HOLMIUM LASER/STENT PLACEMENT Left 06/03/2021  ? Procedure: CYSTOSCOPY, LEFT RETROGRADE, URETEROSCOPY,HOLMIUM LASER,STENT EXCHANGE;  Surgeon: Crist Fat, MD;  Location: [facility];  Service: Urology;  Laterality: Left;  ? INGUINAL HERNIA REPAIR  1980  ?  KNEE ARTHROSCOPY  2007  ? right  ? LUMBAR EPIDURAL INJECTION  2013  ? Has as needed.  ? LUMBAR LAMINECTOMY/DECOMPRESSION MICRODISCECTOMY N/A 06/19/2013  ? Procedure: LUMBAR LAMINECTOMY/DECOMPRESSION MICRODISCECTOMY;  Surgeon: Emilee Hero, MD;  Location: [facility];  Service: Orthopedics;  Laterality: N/A;  Lumbar 4-5 decompression  ? PACEMAKER IMPLANT N/A 06/16/2017  ? Procedure: Pacemaker Implant;  Surgeon: Marinus Maw, MD;  Location: [facility] INV[facility]IVE [facility];  Service: Cardiovascular;  Laterality: N/A;  ? TRIGGER FINGER  RELEASE Right   ? ? ?MEDICATIONS: ?Current Outpatient Medications on File Prior to Visit  ?Medication Sig Dispense Refill  ? Acetaminophen (TYLENOL 8 HOUR PO) Take 1,000 mg by mouth 3 (three) times daily.    ? aspirin EC 81 MG tablet Take 81 mg by mouth daily.    ? atorvastatin (LIPITOR) 20 MG tablet Take 1 tablet (20 mg total) by mouth daily. TAKE 1 TABLET DAILY AT     6:00PM 90 tablet 1  ? carvedilol (COREG) 6.25 MG tablet TAKE 1 TABLET TWICE DAILY  WITH MEALS 180 tablet 1  ? cholecalciferol (VITAMIN D) 1000 UNITS tablet Take 1,000 Units by mouth daily.    ? clindamycin (CLEOCIN) 300 MG capsule Take 300 mg by mouth 3 (three) times daily.    ? HYDROcodone-acetaminophen (NORCO/VICODIN) 5-325 MG tablet Take 1 tablet by mouth every 6 (six) hours as needed for moderate pain.    ? metFORMIN (GLUCOPHAGE-XR) 500 MG 24 hr tablet TAKE 1 TABLET TWICE A DAY (Patient taking differently: Take 500 mg by mouth in the morning and at bedtime.) 180 tablet 3  ? Multiple Vitamin (MULTIVITAMIN WITH MINERALS) TABS tablet Take 1 tablet by mouth daily.    ? niacin (NIASPAN) 1000 MG CR tablet TAKE 1 TABLET AT BEDTIME (Patient taking differently: Take 1,000 mg by mouth at bedtime.) 90 tablet 3  ? Pirfenidone (ESBRIET) 267 MG TABS Take 1 tab three times daily for 7 days, then 2 tabs three times daily for 7 days, then 3 tabs three times daily thereafter. (Patient not taking: Reported on 01/18/2022) 270 tablet 0  ? Pirfenidone (ESBRIET) 267 MG TABS Take 3 tablets (801 mg total) by mouth with breakfast, with lunch, and with evening meal. 270 tablet 1  ? TURMERIC CURCUMIN PO Take 2,000 mg by mouth in the morning and at bedtime.    ? warfarin (JANTOVEN) 5 MG tablet TAKE AS DIRECTED BY THE    COUMADIN CLINIC (Patient taking differently: Take 2.5-5 mg by mouth See admin instructions. TAKE AS DIRECTED BY THE COUMADIN CLINIC; 5 mg Mon Wed and Fri, and 2.5 mg all other days) 75 tablet 3  ? ?No current facility-administered medications on file prior to  visit.  ? ? ?ALLERGIES: ?Allergies  ?Allergen Reactions  ? Penicillins Other (See Comments)  ?  Taste buds  ? ? ?FAMILY HISTORY: ?Family History  ?Problem Relation Age of Onset  ? Heart failure Mother   ?     died age 60  ? Heart failure Father   ?     died age 73  ? Diabetes Other   ? Heart disease Neg Hx   ? ? ?Objective:  ?Blood pressure 98/60, pulse 95, height 5\' 8"  (1.727 m), weight 171 lb 12.8 oz (77.9 kg), SpO2 95 %. ?General: No acute distress.  Patient appears well-groomed.   ?Head:  Normocephalic/atraumatic ?Eyes:  fundi examined but not visualized ?Neck: supple, no paraspinal tenderness, full range of motion ?Back: bilateral paraspinal tenderness ?Heart: regular rate  and rhythm ?Lungs: Clear to auscultation bilaterally. ?Neurological Exam: ?Mental status: alert and oriented to person, place, and time, recent and remote memory intact, fund of knowledge intact, attention and concentration intact, speech fluent and not dysarthric, language intact. ?Cranial nerves: ?CN I: not tested ?CN II: pupils equal, round and reactive to light, visual fields intact ?CN III, IV, VI:  full range of motion, no nystagmus, no ptosis ?CN V: facial sensation intact. ?CN VII: upper and lower face symmetric ?CN VIII: hearing intact ?CN IX, X: gag intact, uvula midline ?CN XI: sternocleidomastoid and trapezius muscles intact ?CN XII: tongue midline ?Bulk & Tone: normal, no fasciculations. ?Motor:  muscle strength 5-/5 left hip flexion and knee extension, 4+/5 left AHL.  Otherwise 5/5. ?Sensation:  Pinprick sensation reduced in left foot, vibratory sensation reduced in both feet. ?Deep Tendon Reflexes:  1+ upper extremities, absent lower extremities,  toes downgoing.   ?Finger to nose testing:  Without dysmetria.   ?Heel to shin:  Without dysmetria.   ?Gait:  Mildly broad based but steady.  Romberg with sway. ? ? ? ?Thank you for allowing me to take part in the care of this patient. ? ?Shon MilletAdam Cagney Steenson, DO ? ?CC: Cheryll CockayneStacy Burns,  MD ? ? ? ? ?

## 2022-02-09 ENCOUNTER — Encounter: Payer: Self-pay | Admitting: Neurology

## 2022-02-09 ENCOUNTER — Ambulatory Visit (INDEPENDENT_AMBULATORY_CARE_PROVIDER_SITE_OTHER): Payer: Medicare Other | Admitting: Neurology

## 2022-02-09 ENCOUNTER — Other Ambulatory Visit (INDEPENDENT_AMBULATORY_CARE_PROVIDER_SITE_OTHER): Payer: Medicare Other

## 2022-02-09 VITALS — BP 98/60 | HR 95 | Ht 68.0 in | Wt 171.8 lb

## 2022-02-09 DIAGNOSIS — M5442 Lumbago with sciatica, left side: Secondary | ICD-10-CM | POA: Diagnosis not present

## 2022-02-09 DIAGNOSIS — G8929 Other chronic pain: Secondary | ICD-10-CM

## 2022-02-09 DIAGNOSIS — M6281 Muscle weakness (generalized): Secondary | ICD-10-CM

## 2022-02-09 LAB — CK: Total CK: 60 U/L (ref 7–232)

## 2022-02-09 NOTE — Patient Instructions (Signed)
I don't appreciate any weakness except for in the left leg which is likely residual from the back.  I will check a blood test looking for evidence of muscle damage.  Further recommendations pending results.   ?

## 2022-02-10 ENCOUNTER — Encounter: Payer: Self-pay | Admitting: Pulmonary Disease

## 2022-02-10 ENCOUNTER — Ambulatory Visit (INDEPENDENT_AMBULATORY_CARE_PROVIDER_SITE_OTHER): Payer: Medicare Other | Admitting: Pulmonary Disease

## 2022-02-10 VITALS — BP 118/64 | HR 81 | Ht 68.0 in | Wt 170.6 lb

## 2022-02-10 DIAGNOSIS — Z5181 Encounter for therapeutic drug level monitoring: Secondary | ICD-10-CM

## 2022-02-10 DIAGNOSIS — E1149 Type 2 diabetes mellitus with other diabetic neurological complication: Secondary | ICD-10-CM | POA: Diagnosis not present

## 2022-02-10 DIAGNOSIS — J8410 Pulmonary fibrosis, unspecified: Secondary | ICD-10-CM

## 2022-02-10 LAB — COMPREHENSIVE METABOLIC PANEL
ALT: 14 U/L (ref 0–53)
AST: 15 U/L (ref 0–37)
Albumin: 4 g/dL (ref 3.5–5.2)
Alkaline Phosphatase: 40 U/L (ref 39–117)
BUN: 29 mg/dL — ABNORMAL HIGH (ref 6–23)
CO2: 25 mEq/L (ref 19–32)
Calcium: 9.2 mg/dL (ref 8.4–10.5)
Chloride: 107 mEq/L (ref 96–112)
Creatinine, Ser: 1.48 mg/dL (ref 0.40–1.50)
GFR: 42.83 mL/min — ABNORMAL LOW (ref >60.00)
Glucose, Bld: 245 mg/dL — ABNORMAL HIGH (ref 70–99)
Potassium: 4.4 mEq/L (ref 3.5–5.1)
Sodium: 140 mEq/L (ref 135–145)
Total Bilirubin: 0.4 mg/dL (ref 0.2–1.2)
Total Protein: 6.6 g/dL (ref 6.0–8.3)

## 2022-02-10 NOTE — Patient Instructions (Addendum)
Continue on pirfenidone 3 tablets 3 times per day ? ?We will order you a new prescription of pifenidone 801mg  tablet 1 tab 3 times per day. This prescription may be available in May.  ? ?We will refer you to a nutritionist to help work on adequate diet to ward off further weight loss.  ? ?We will check labs today for monitoring of your liver function on pirfenidone ? ?We will having a standing lab order to check labs 1 time per month over the next 5 months ? ?Follow up in 3 months.  ?

## 2022-02-10 NOTE — Progress Notes (Signed)
? ?Synopsis: Referred in December 2022 for shortness of breath by Billey Gosling, MD ? ?Subjective:  ? ?[name] GENDER: male DOB: Nov 24, 1933, MRN: [id number] ? ?HPI ? ?Chief Complaint  ?Patient presents with  ? Follow-up  ?  3 mo f/u. States he has been tolerating the Esbriet well.   ? ?Andrew [name] is an 88 year old man, never smoker with DM II, CAD, complete heart block s/p pacemaker, CKDIII, chronic DVT and hypertension who returns to pulmonary clinic for pulmonary fibrosis.  ? ?ONO did not indicate he needed supplemental oxygen.  ? ?He started pirfenidone in early March. He recently saw neurology 02/09/22 for poor balance and generalize muscle weakness. No primary neurologic disorder suspected. ? ?He has tolerated the medication well so far. He reports reduced appetite and has lost 10-15lbs over recent months. ? ?OV 11/24/21 ?HRCT Chest scan shows widespread areas of mild ground-glass attenuation, septal thickening, subpleural reticulation, septal thickening, traction bronchiectasis and mild honeycombing with craniocaudal gradient and progression from prior scan on 11/2009. These findings are consistent with usual interstitial pneumonia. ? ?Echo 11/08/21 showed normal LV EF 55-60% and grade I diastolic dysfunction. RV systolic function is mildly reduced, with normal size and PASP. ? ?He has been heating the house with a water stove which he loads with wood to burn to heat the water that then is used to heat his home and water. He has been using this source of heat since the 1980s and is exposed to smoke when loading the furnace.  ? ?OV 10/11/21 ?Patient reports progressive exertional dyspnea over the last 1 year.  He notices it mainly when walking up the steps in his home.  He denies any fatigue associated with the dyspnea.  He is able to rest and recover and then continue on with any further exertional activity.  He denies any chest pain with the shortness of breath.  He denies any changes in  lower extremity edema as he has chronic left lower extremity edema due to a chronic DVT. ? ?He was seen by cardiology 08/17/21 with no complaints of dyspnea documented at that time as he did not bring this to their attention. Note reviewed by Dr. Quay Burow, PCP, from 10/03/21 which noted concern for chronic dyspnea with exertion and dry crackles on exam bilaterally.  ? ?Patient denies any cough or wheezing with the shortness of breath.  He denies any nighttime awakenings with shortness of breath.  He is a never smoker.  Denies any significant secondhand smoke exposure.  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  He denies any significant dust or chemical exposures throughout his career. ? ?He denies any significant heartburn or reflux symptoms. ? ?Past Medical History:  ?Diagnosis Date  ? Colonic polyp 2011 last colo  ? colo q 65yr - [facility]   ? Coronary artery disease 02/2009  ? severe left main and three-vessel, CABG  ? DEGENERATIVE JOINT DISEASE   ? Diverticulosis of colon   ? DIVERTICULOSIS, COLON   ? History of kidney stones   ? HYPERLIPIDEMIA-MIXED   ? HYPERTENSION, BENIGN   ? LBP (low back pain)   ? s/p decompression 8/14; ESI - [place]; RFA x 3 early 2015  ? Pacemaker   ? a. 05/2017: St. Jude (serial number [id number]) pacemaker  ? Symptomatic bradycardia   ? a. s/p St. Jude (serial number [id number]) pacemaker 06/16/17  ? Type II or unspecified type diabetes mellitus without mention  of complication, not stated as uncontrolled   ? VITAMIN D DEFICIENCY   ?  ? ?Family History  ?Problem Relation Age of Onset  ? Heart failure Mother   ?     died age 78  ? Heart failure Father   ?     died age 40  ? Diabetes Other   ? Heart disease Neg Hx   ?  ? ?Social History  ? ?Socioeconomic History  ? Marital status: Married  ?  Spouse name: Not on file  ? Number of children: Not on file  ? Years of education: Not on file  ? Highest education level: Not on file  ?Occupational  History  ? Not on file  ?Tobacco Use  ? Smoking status: Never  ? Smokeless tobacco: Never  ? Tobacco comments:  ?  Married, 3 grown children. Retired 04/2002 from [employer] work  ?Vaping Use  ? Vaping Use: Never used  ?Substance and Sexual Activity  ? Alcohol use: Yes  ?  Comment: RARE  ? Drug use: No  ? Sexual activity: Not on file  ?Other Topics Concern  ? Not on file  ?Social History Narrative  ? Not on file  ? ?Social Determinants of Health  ? ?Financial Resource Strain: Low Risk   ? Difficulty of Paying Living Expenses: Not hard at all  ?Food Insecurity: No Food Insecurity  ? Worried About Charity fundraiser in the Last Year: Never true  ? Ran Out of Food in the Last Year: Never true  ?Transportation Needs: No Transportation Needs  ? Lack of Transportation (Medical): No  ? Lack of Transportation (Non-Medical): No  ?Physical Activity: Sufficiently Active  ? Days of Exercise per Week: 5 days  ? Minutes of Exercise per Session: 30 min  ?Stress: No Stress Concern Present  ? Feeling of Stress : Not at all  ?Social Connections: Moderately Integrated  ? Frequency of Communication with Friends and Family: More than three times a week  ? Frequency of Social Gatherings with Friends and Family: Once a week  ? Attends Religious Services: Never  ? Active Member of Clubs or Organizations: Yes  ? Attends Archivist Meetings: 1 to 4 times per year  ? Marital Status: Married  ?Intimate Partner Violence: Not At Risk  ? Fear of Current or Ex-Partner: No  ? Emotionally Abused: No  ? Physically Abused: No  ? Sexually Abused: No  ?  ? ?Allergies  ?Allergen Reactions  ? Penicillins Other (See Comments)  ?  Taste buds  ?  ? ?Outpatient Medications Prior to Visit  ?Medication Sig Dispense Refill  ? Acetaminophen (TYLENOL 8 HOUR PO) Take 1,000 mg by mouth 3 (three) times daily.    ? aspirin EC 81 MG tablet Take 81 mg by mouth daily.    ? atorvastatin (LIPITOR) 20 MG tablet Take 1 tablet (20 mg total) by mouth  daily. TAKE 1 TABLET DAILY AT     6:00PM 90 tablet 1  ? carvedilol (COREG) 6.25 MG tablet TAKE 1 TABLET TWICE DAILY  WITH MEALS 180 tablet 1  ? cholecalciferol (VITAMIN D) 1000 UNITS tablet Take 1,000 Units by mouth daily.    ? HYDROcodone-acetaminophen (NORCO/VICODIN) 5-325 MG tablet Take 1 tablet by mouth every 6 (six) hours as needed for moderate pain.    ? metFORMIN (GLUCOPHAGE-XR) 500 MG 24 hr tablet TAKE 1 TABLET TWICE A DAY (Patient taking differently: Take 500 mg by mouth in the morning and at  bedtime.) 180 tablet 3  ? Multiple Vitamin (MULTIVITAMIN WITH MINERALS) TABS tablet Take 1 tablet by mouth daily.    ? niacin (NIASPAN) 1000 MG CR tablet TAKE 1 TABLET AT BEDTIME (Patient taking differently: Take 1,000 mg by mouth at bedtime.) 90 tablet 3  ? Pirfenidone (ESBRIET) 267 MG TABS Take 3 tablets (801 mg total) by mouth with breakfast, with lunch, and with evening meal. 270 tablet 1  ? TURMERIC CURCUMIN PO Take 2,000 mg by mouth in the morning and at bedtime.    ? warfarin (JANTOVEN) 5 MG tablet TAKE AS DIRECTED BY THE    COUMADIN CLINIC (Patient taking differently: Take 2.5-5 mg by mouth See admin instructions. TAKE AS DIRECTED BY THE COUMADIN CLINIC; 5 mg Mon Wed and Fri, and 2.5 mg all other days) 75 tablet 3  ? clindamycin (CLEOCIN) 300 MG capsule Take 300 mg by mouth 3 (three) times daily.    ? Pirfenidone (ESBRIET) 267 MG TABS Take 1 tab three times daily for 7 days, then 2 tabs three times daily for 7 days, then 3 tabs three times daily thereafter. 270 tablet 0  ? ?No facility-administered medications prior to visit.  ? ?Review of Systems  ?Constitutional:  Negative for chills, fever, malaise/fatigue and weight loss.  ?HENT:  Negative for congestion, sinus pain and sore throat.   ?Eyes: Negative.   ?Respiratory:  Positive for shortness of breath. Negative for cough, hemoptysis, sputum production and wheezing.   ?Cardiovascular:  Negative for chest pain, palpitations, orthopnea, claudication and leg  swelling.  ?Gastrointestinal:  Negative for abdominal pain, heartburn, nausea and vomiting.  ?Genitourinary: Negative.   ?Musculoskeletal:  Negative for joint pain and myalgias.  ?Skin:  Negative for rash.  ?Neur

## 2022-02-13 ENCOUNTER — Telehealth: Payer: Self-pay | Admitting: Pharmacist

## 2022-02-13 ENCOUNTER — Encounter: Payer: Self-pay | Admitting: Pulmonary Disease

## 2022-02-13 ENCOUNTER — Other Ambulatory Visit: Payer: Self-pay | Admitting: Internal Medicine

## 2022-02-13 DIAGNOSIS — J8410 Pulmonary fibrosis, unspecified: Secondary | ICD-10-CM

## 2022-02-13 DIAGNOSIS — Z5181 Encounter for therapeutic drug level monitoring: Secondary | ICD-10-CM

## 2022-02-13 MED ORDER — PIRFENIDONE 801 MG PO TABS: 801.0000 mg | ORAL_TABLET | Freq: Three times a day (TID) | ORAL | 5 refills | Status: DC

## 2022-02-13 NOTE — Telephone Encounter (Addendum)
CMET on 02/10/22 stable. ? ?Rx for pirfenidone 801mg  three times daily w meals sent to [facility]. Notified patient via MyChart ? ?[name] Saliva, PharmD, MPH, BCPS ?Clinical Pharmacist (Rheumatology and Pulmonology) ? ?----- Message from Freddi Starr, MD sent at 02/13/2022  4:05 PM EDT ----- ?Regarding: Updated pirfendione prescription ?Hi Andrew [name], ? ?Can you send in updated prescription for pirfenidone 801mg  tablets, he has been tolerating the 3 tablets three times per day without issue. ? ?Thanks, ?JD ? ?

## 2022-02-14 ENCOUNTER — Other Ambulatory Visit: Payer: Self-pay

## 2022-02-14 DIAGNOSIS — E1149 Type 2 diabetes mellitus with other diabetic neurological complication: Secondary | ICD-10-CM

## 2022-02-15 ENCOUNTER — Ambulatory Visit (INDEPENDENT_AMBULATORY_CARE_PROVIDER_SITE_OTHER): Payer: Medicare Other

## 2022-02-15 DIAGNOSIS — I82409 Acute embolism and thrombosis of unspecified deep veins of unspecified lower extremity: Secondary | ICD-10-CM | POA: Diagnosis not present

## 2022-02-15 DIAGNOSIS — Z5181 Encounter for therapeutic drug level monitoring: Secondary | ICD-10-CM

## 2022-02-15 LAB — POCT INR: INR: 1.4 — AB (ref 2.0–3.0)

## 2022-02-15 NOTE — Patient Instructions (Signed)
-   take 2 tablets warfarin tonight, then ?- INCREASE to 1/2 tablet (2.5 mg) daily except 1 tablet (5 mg) on MONDAYS and WEDNESDAYS ?- Recheck INR in 3 weeks ?Keep your green leafy vegetables- pick a day (or days)  to have your greens and have them every week ?

## 2022-03-07 ENCOUNTER — Other Ambulatory Visit: Payer: Self-pay | Admitting: Internal Medicine

## 2022-03-07 ENCOUNTER — Other Ambulatory Visit: Payer: Self-pay | Admitting: Cardiovascular Disease

## 2022-03-08 ENCOUNTER — Ambulatory Visit (INDEPENDENT_AMBULATORY_CARE_PROVIDER_SITE_OTHER): Payer: Medicare Other

## 2022-03-08 DIAGNOSIS — Z5181 Encounter for therapeutic drug level monitoring: Secondary | ICD-10-CM

## 2022-03-08 DIAGNOSIS — I82409 Acute embolism and thrombosis of unspecified deep veins of unspecified lower extremity: Secondary | ICD-10-CM

## 2022-03-08 LAB — POCT INR: INR: 1.6 — AB (ref 2.0–3.0)

## 2022-03-08 NOTE — Patient Instructions (Signed)
-   take 2 tablets warfarin tonight, then ?- INCREASE to 1/2 tablet (2.5 mg) daily except 1 tablet (5 mg) on MONDAYS, WEDNESDAYS and FRIDAYS. ?- Recheck INR in 3 weeks ?Keep your green leafy vegetables- pick a day (or days)  to have your greens and have them every week ?

## 2022-03-10 ENCOUNTER — Other Ambulatory Visit (INDEPENDENT_AMBULATORY_CARE_PROVIDER_SITE_OTHER): Payer: Medicare Other

## 2022-03-10 DIAGNOSIS — Z5181 Encounter for therapeutic drug level monitoring: Secondary | ICD-10-CM

## 2022-03-10 LAB — HEPATIC FUNCTION PANEL
ALT: 20 U/L (ref 0–53)
AST: 21 U/L (ref 0–37)
Albumin: 4.1 g/dL (ref 3.5–5.2)
Alkaline Phosphatase: 42 U/L (ref 39–117)
Bilirubin, Direct: 0.1 mg/dL (ref 0.0–0.3)
Total Bilirubin: 0.3 mg/dL (ref 0.2–1.2)
Total Protein: 6.7 g/dL (ref 6.0–8.3)

## 2022-03-10 LAB — COMPREHENSIVE METABOLIC PANEL
ALT: 20 U/L (ref 0–53)
AST: 21 U/L (ref 0–37)
Albumin: 4.1 g/dL (ref 3.5–5.2)
Alkaline Phosphatase: 42 U/L (ref 39–117)
BUN: 28 mg/dL — ABNORMAL HIGH (ref 6–23)
CO2: 26 mEq/L (ref 19–32)
Calcium: 9.5 mg/dL (ref 8.4–10.5)
Chloride: 107 mEq/L (ref 96–112)
Creatinine, Ser: 1.49 mg/dL (ref 0.40–1.50)
GFR: 42.46 mL/min — ABNORMAL LOW (ref >60.00)
Glucose, Bld: 254 mg/dL — ABNORMAL HIGH (ref 70–99)
Potassium: 4.2 mEq/L (ref 3.5–5.1)
Sodium: 141 mEq/L (ref 135–145)
Total Bilirubin: 0.3 mg/dL (ref 0.2–1.2)
Total Protein: 6.7 g/dL (ref 6.0–8.3)

## 2022-03-13 ENCOUNTER — Telehealth: Payer: Self-pay | Admitting: Pulmonary Disease

## 2022-03-13 NOTE — Telephone Encounter (Signed)
ex: [id number]; States that there is a $14 copay for profenodom due to manufactoprer change on their end. The patient assitance is no longer used with this manufactorer but there is anew one he could use that would cover. Pt wants JD aware and to know what to do prior to switching. Please advise.

## 2022-03-13 NOTE — Telephone Encounter (Signed)
Hunt[facility] Dec from [facility] states:   ex: [id number];   that there is a $14 copay for profenodom due to manufactoprer change on their end.   The patient assitance is no longer used with this manufactorer but there is anew one he could use that would cover.   Pt wants JD aware and to know what to do prior to switching.   Please advise Dr Francine Graven

## 2022-03-14 NOTE — Telephone Encounter (Signed)
Dr Erin Fulling,  Patient just wanted me to clarify with you:  Pirfenidone 801mg  1 tablet three times a day correct?  He just wanted to make sure because he gets his shipment tomorrow at his house.

## 2022-03-14 NOTE — Telephone Encounter (Signed)
Called patient and confirmed with his the dosage that Dr Lisabeth Pick states  Pifenidone 801mg  1 tablet 3 times a day  Patietn verbalized understanding. Nothing further needed

## 2022-03-15 ENCOUNTER — Ambulatory Visit (INDEPENDENT_AMBULATORY_CARE_PROVIDER_SITE_OTHER): Payer: Medicare Other

## 2022-03-15 DIAGNOSIS — I459 Conduction disorder, unspecified: Secondary | ICD-10-CM | POA: Diagnosis not present

## 2022-03-15 LAB — CUP PACEART REMOTE DEVICE CHECK
Battery Remaining Longevity: 72 mo
Battery Remaining Percentage: 58 %
Battery Voltage: 2.99 V
Brady Statistic AP VP Percent: 6.6 %
Brady Statistic AP VS Percent: 1 %
Brady Statistic AS VP Percent: 93 %
Brady Statistic AS VS Percent: 1 %
Brady Statistic RA Percent Paced: 6.2 %
Brady Statistic RV Percent Paced: 99 %
Date Time Interrogation Session: 20230524030138
Implantable Lead Implant Date: 20180825
Implantable Lead Implant Date: 20180825
Implantable Lead Location: 753859
Implantable Lead Location: 753860
Implantable Pulse Generator Implant Date: 20180825
Lead Channel Impedance Value: 480 Ohm
Lead Channel Impedance Value: 630 Ohm
Lead Channel Pacing Threshold Amplitude: 0.5 V
Lead Channel Pacing Threshold Amplitude: 0.75 V
Lead Channel Pacing Threshold Pulse Width: 0.5 ms
Lead Channel Pacing Threshold Pulse Width: 0.5 ms
Lead Channel Sensing Intrinsic Amplitude: 10.2 mV
Lead Channel Sensing Intrinsic Amplitude: 4.6 mV
Lead Channel Setting Pacing Amplitude: 1 V
Lead Channel Setting Pacing Amplitude: 2 V
Lead Channel Setting Pacing Pulse Width: 0.5 ms
Lead Channel Setting Sensing Sensitivity: 4 mV
Pulse Gen Model: 2272
Pulse Gen Serial Number: 8939923

## 2022-03-28 NOTE — Progress Notes (Signed)
Remote pacemaker transmission.   

## 2022-03-29 ENCOUNTER — Ambulatory Visit (INDEPENDENT_AMBULATORY_CARE_PROVIDER_SITE_OTHER): Payer: Medicare Other

## 2022-03-29 DIAGNOSIS — I82409 Acute embolism and thrombosis of unspecified deep veins of unspecified lower extremity: Secondary | ICD-10-CM

## 2022-03-29 DIAGNOSIS — Z5181 Encounter for therapeutic drug level monitoring: Secondary | ICD-10-CM

## 2022-03-29 LAB — POCT INR: INR: 1.7 — AB (ref 2.0–3.0)

## 2022-03-29 NOTE — Patient Instructions (Signed)
-   take 2 tablets warfarin tonight, then - Continue 1/2 tablet (2.5 mg) daily except 1 tablet (5 mg) on MONDAYS, WEDNESDAYS and FRIDAYS. - Recheck INR in 3 weeks Keep your green leafy vegetables- pick a day (or days)  to have your greens and have them every week

## 2022-04-02 ENCOUNTER — Encounter: Payer: Self-pay | Admitting: Internal Medicine

## 2022-04-02 DIAGNOSIS — J8410 Pulmonary fibrosis, unspecified: Secondary | ICD-10-CM | POA: Insufficient documentation

## 2022-04-02 NOTE — Progress Notes (Unsigned)
Subjective:    [name], male    DOB: 1934-03-28, 88 y.o.   MRN: [id number]     HPI Rahmon is here for follow up of his chronic medical problems, including DM, CAD, hld, OA, CKD  He is taking all of his medications as prescribed.    He still feels his muscle mass in his legs is less and his legs are weak.  He has lower back pain with standing too long.  He gets shots every 3 months.  He has chronic lower back pain - takes pain medication daily.  The back pain limits him greatly.  He did see neurology who thought that his leg weakness was likely related to his back and no true muscle weakness.  He does plan on following up with his back doctor to discuss options.  Lost 13 lb in past 3 months on pirfenidone.    Medications and allergies reviewed with patient and updated if appropriate.  Current Outpatient Medications on File Prior to Visit  Medication Sig Dispense Refill   Acetaminophen (TYLENOL 8 HOUR PO) Take 1,000 mg by mouth 3 (three) times daily.     aspirin EC 81 MG tablet Take 81 mg by mouth daily.     atorvastatin (LIPITOR) 20 MG tablet Take 1 tablet (20 mg total) by mouth daily. TAKE 1 TABLET DAILY AT     6:00PM 90 tablet 1   carvedilol (COREG) 6.25 MG tablet TAKE 1 TABLET TWICE DAILY  WITH MEALS 180 tablet 0   cholecalciferol (VITAMIN D) 1000 UNITS tablet Take 1,000 Units by mouth daily.     HYDROcodone-acetaminophen (NORCO/VICODIN) 5-325 MG tablet Take 1 tablet by mouth every 6 (six) hours as needed for moderate pain.     metFORMIN (GLUCOPHAGE-XR) 500 MG 24 hr tablet TAKE 1 TABLET TWICE A DAY 180 tablet 3   Multiple Vitamin (MULTIVITAMIN WITH MINERALS) TABS tablet Take 1 tablet by mouth daily.     niacin (NIASPAN) 1000 MG CR tablet TAKE 1 TABLET AT BEDTIME 90 tablet 3   Pirfenidone 801 MG TABS Take 801 mg by mouth 3 (three) times daily with meals. 90 tablet 5   TURMERIC CURCUMIN PO Take 2,000 mg by mouth in the morning and at bedtime.      warfarin (JANTOVEN) 5 MG tablet TAKE AS DIRECTED BY THE    COUMADIN CLINIC (Patient taking differently: Take 2.5-5 mg by mouth See admin instructions. TAKE AS DIRECTED BY THE COUMADIN CLINIC; 5 mg Mon Wed and Fri, and 2.5 mg all other days) 75 tablet 3   No current facility-administered medications on file prior to visit.     Review of Systems  Constitutional:  Negative for fever.  Respiratory:  Positive for shortness of breath (with exertion). Negative for cough and wheezing.   Cardiovascular:  Negative for chest pain, palpitations and leg swelling.  Neurological:  Positive for weakness (b/l legs). Negative for light-headedness and headaches.       Objective:   Vitals:   04/03/22 0836  BP: 108/60  Pulse: 73  Temp: 97.9 F (36.6 C)  SpO2: 96%   BP Readings from Last 3 Encounters:  04/03/22 108/60  02/10/22 118/64  02/09/22 98/60   Wt Readings from Last 3 Encounters:  04/03/22 166 lb 9.6 oz (75.6 kg)  02/10/22 170 lb 9.6 oz (77.4 kg)  02/09/22 171 lb 12.8 oz (77.9 kg)   Body mass index is 25.33 kg/m.    Physical Exam Constitutional:  General: He is not in acute distress.    Appearance: Normal appearance. He is not ill-appearing.  HENT:     Head: Normocephalic and atraumatic.  Eyes:     Conjunctiva/sclera: Conjunctivae normal.  Cardiovascular:     Rate and Rhythm: Normal rate and regular rhythm.     Heart sounds: Normal heart sounds. No murmur heard. Pulmonary:     Effort: Pulmonary effort is normal. No respiratory distress.     Breath sounds: Normal breath sounds. No wheezing or rales.  Musculoskeletal:     Right lower leg: No edema.     Left lower leg: No edema.  Skin:    General: Skin is warm and dry.     Findings: No rash.  Neurological:     Mental Status: He is alert. Mental status is at baseline.  Psychiatric:        Mood and Affect: Mood normal.        Lab Results  Component Value Date   WBC 5.6 10/03/2021   HGB 11.4 (L) 10/03/2021   HCT  34.2 (L) 10/03/2021   PLT 176.0 10/03/2021   GLUCOSE 254 (H) 03/10/2022   CHOL 126 10/03/2021   TRIG 109.0 10/03/2021   HDL 50.60 10/03/2021   LDLDIRECT 72.8 04/18/2011   LDLCALC 54 10/03/2021   ALT 20 03/10/2022   ALT 20 03/10/2022   AST 21 03/10/2022   AST 21 03/10/2022   NA 141 03/10/2022   K 4.2 03/10/2022   CL 107 03/10/2022   CREATININE 1.49 03/10/2022   BUN 28 (H) 03/10/2022   CO2 26 03/10/2022   TSH 1.12 10/01/2019   PSA 0.6 09/22/2010   INR 1.7 (A) 03/29/2022   HGBA1C 7.3 (H) 10/03/2021   MICROALBUR 1.2 09/29/2020     Assessment & Plan:    See Problem List for Assessment and Plan of chronic medical problems.

## 2022-04-03 ENCOUNTER — Ambulatory Visit (INDEPENDENT_AMBULATORY_CARE_PROVIDER_SITE_OTHER): Payer: Medicare Other | Admitting: Internal Medicine

## 2022-04-03 VITALS — BP 108/60 | HR 73 | Temp 97.9°F | Ht 68.0 in | Wt 166.6 lb

## 2022-04-03 DIAGNOSIS — E559 Vitamin D deficiency, unspecified: Secondary | ICD-10-CM | POA: Diagnosis not present

## 2022-04-03 DIAGNOSIS — M5137 Other intervertebral disc degeneration, lumbosacral region: Secondary | ICD-10-CM

## 2022-04-03 DIAGNOSIS — M159 Polyosteoarthritis, unspecified: Secondary | ICD-10-CM

## 2022-04-03 DIAGNOSIS — J8410 Pulmonary fibrosis, unspecified: Secondary | ICD-10-CM

## 2022-04-03 DIAGNOSIS — E785 Hyperlipidemia, unspecified: Secondary | ICD-10-CM

## 2022-04-03 DIAGNOSIS — M51379 Other intervertebral disc degeneration, lumbosacral region without mention of lumbar back pain or lower extremity pain: Secondary | ICD-10-CM

## 2022-04-03 DIAGNOSIS — N1832 Chronic kidney disease, stage 3b: Secondary | ICD-10-CM | POA: Diagnosis not present

## 2022-04-03 DIAGNOSIS — I10 Essential (primary) hypertension: Secondary | ICD-10-CM

## 2022-04-03 DIAGNOSIS — I25708 Atherosclerosis of coronary artery bypass graft(s), unspecified, with other forms of angina pectoris: Secondary | ICD-10-CM

## 2022-04-03 DIAGNOSIS — E1149 Type 2 diabetes mellitus with other diabetic neurological complication: Secondary | ICD-10-CM

## 2022-04-03 LAB — LIPID PANEL
Cholesterol: 135 mg/dL (ref 0–200)
HDL: 52.5 mg/dL (ref >39.00)
LDL Cholesterol: 55 mg/dL (ref 0–99)
NonHDL: 82.14
Total CHOL/HDL Ratio: 3
Triglycerides: 138 mg/dL (ref 0.0–149.0)
VLDL: 27.6 mg/dL (ref 0.0–40.0)

## 2022-04-03 LAB — COMPREHENSIVE METABOLIC PANEL
ALT: 15 U/L (ref 0–53)
AST: 15 U/L (ref 0–37)
Albumin: 3.9 g/dL (ref 3.5–5.2)
Alkaline Phosphatase: 42 U/L (ref 39–117)
BUN: 27 mg/dL — ABNORMAL HIGH (ref 6–23)
CO2: 28 mEq/L (ref 19–32)
Calcium: 9.5 mg/dL (ref 8.4–10.5)
Chloride: 105 mEq/L (ref 96–112)
Creatinine, Ser: 1.48 mg/dL (ref 0.40–1.50)
GFR: 42.79 mL/min — ABNORMAL LOW (ref >60.00)
Glucose, Bld: 176 mg/dL — ABNORMAL HIGH (ref 70–99)
Potassium: 4.8 mEq/L (ref 3.5–5.1)
Sodium: 140 mEq/L (ref 135–145)
Total Bilirubin: 0.3 mg/dL (ref 0.2–1.2)
Total Protein: 6.2 g/dL (ref 6.0–8.3)

## 2022-04-03 LAB — CBC WITH DIFFERENTIAL/PLATELET
Basophils Absolute: 0 10*3/uL (ref 0.0–0.1)
Basophils Relative: 0.6 % (ref 0.0–3.0)
Eosinophils Absolute: 0.3 10*3/uL (ref 0.0–0.7)
Eosinophils Relative: 5.2 % — ABNORMAL HIGH (ref 0.0–5.0)
HCT: 35.6 % — ABNORMAL LOW (ref 39.0–52.0)
Hemoglobin: 12.1 g/dL — ABNORMAL LOW (ref 13.0–17.0)
Lymphocytes Relative: 17.4 % (ref 12.0–46.0)
Lymphs Abs: 1.1 10*3/uL (ref 0.7–4.0)
MCHC: 33.8 g/dL (ref 30.0–36.0)
MCV: 105.2 fl — ABNORMAL HIGH (ref 78.0–100.0)
Monocytes Absolute: 0.5 10*3/uL (ref 0.1–1.0)
Monocytes Relative: 7.6 % (ref 3.0–12.0)
Neutro Abs: 4.5 10*3/uL (ref 1.4–7.7)
Neutrophils Relative %: 69.2 % (ref 43.0–77.0)
Platelets: 208 10*3/uL (ref 150.0–400.0)
RBC: 3.39 Mil/uL — ABNORMAL LOW (ref 4.22–5.81)
RDW: 15.4 % (ref 11.5–15.5)
WBC: 6.5 10*3/uL (ref 4.0–10.5)

## 2022-04-03 LAB — HEMOGLOBIN A1C: Hgb A1c MFr Bld: 6.7 % — ABNORMAL HIGH (ref 4.6–6.5)

## 2022-04-03 LAB — MICROALBUMIN / CREATININE URINE RATIO
Creatinine,U: 102.8 mg/dL
Microalb Creat Ratio: 1.3 mg/g (ref 0.0–30.0)
Microalb, Ur: 1.3 mg/dL (ref 0.0–1.9)

## 2022-04-03 LAB — VITAMIN D 25 HYDROXY (VIT D DEFICIENCY, FRACTURES): VITD: 45.51 ng/mL (ref 30.00–100.00)

## 2022-04-03 NOTE — Assessment & Plan Note (Signed)
Chronic Was on Celebrex, but this was discontinued secondary to CKD Taking Tylenol up to 3000 mg daily as needed Topical arthritis medications, heat, ice

## 2022-04-03 NOTE — Patient Instructions (Addendum)
     Blood work was ordered.     Medications changes include :   None    Return in about 6 months (around 10/03/2022) for follow up.

## 2022-04-03 NOTE — Assessment & Plan Note (Signed)
Chronic Following with cardiology No symptoms consistent with angina Continue current medications including aspirin 81 mg daily, atorvastatin 20 mg daily, Coreg 6.25 mg daily

## 2022-04-03 NOTE — Assessment & Plan Note (Signed)
Chronic °Regular exercise and healthy diet encouraged °Check lipid panel  °Continue atorvastatin 20 mg daily °

## 2022-04-03 NOTE — Assessment & Plan Note (Signed)
Chronic Taking vitamin D daily Check vitamin D level  

## 2022-04-03 NOTE — Assessment & Plan Note (Addendum)
Chronic Sees ortho - murphy wainer Significant pain and limitations from the back Taking pain medication-hydrocodone

## 2022-04-03 NOTE — Assessment & Plan Note (Signed)
Chronic ?Stable ?CMP ?

## 2022-04-03 NOTE — Assessment & Plan Note (Addendum)
Chronic This is a newer diagnosis He is following with pulmonary and on pirfenidone Is experiencing weight loss secondary to the medication - 13 lbs in the past 3 months - advised trying to increase intake - discuss with pulm

## 2022-04-03 NOTE — Assessment & Plan Note (Addendum)
Chronic  Lab Results  Component Value Date   HGBA1C 7.3 (H) 10/03/2021   Sugars fairly controlled Check A1c, urine microalbumin today Sugars may be better controlled since he lost the weight Continue metformin XR 500 mg twice daily Stressed regular exercise, diabetic diet

## 2022-04-13 ENCOUNTER — Other Ambulatory Visit (INDEPENDENT_AMBULATORY_CARE_PROVIDER_SITE_OTHER): Payer: Medicare Other

## 2022-04-13 DIAGNOSIS — Z5181 Encounter for therapeutic drug level monitoring: Secondary | ICD-10-CM

## 2022-04-13 LAB — COMPREHENSIVE METABOLIC PANEL
ALT: 14 U/L (ref 0–53)
AST: 18 U/L (ref 0–37)
Albumin: 4.1 g/dL (ref 3.5–5.2)
Alkaline Phosphatase: 41 U/L (ref 39–117)
BUN: 27 mg/dL — ABNORMAL HIGH (ref 6–23)
CO2: 26 mEq/L (ref 19–32)
Calcium: 9.4 mg/dL (ref 8.4–10.5)
Chloride: 106 mEq/L (ref 96–112)
Creatinine, Ser: 1.38 mg/dL (ref 0.40–1.50)
GFR: 46.52 mL/min — ABNORMAL LOW (ref >60.00)
Glucose, Bld: 222 mg/dL — ABNORMAL HIGH (ref 70–99)
Potassium: 4.3 mEq/L (ref 3.5–5.1)
Sodium: 139 mEq/L (ref 135–145)
Total Bilirubin: 0.4 mg/dL (ref 0.2–1.2)
Total Protein: 6.5 g/dL (ref 6.0–8.3)

## 2022-04-19 ENCOUNTER — Ambulatory Visit (INDEPENDENT_AMBULATORY_CARE_PROVIDER_SITE_OTHER): Payer: Medicare Other

## 2022-04-19 DIAGNOSIS — Z5181 Encounter for therapeutic drug level monitoring: Secondary | ICD-10-CM

## 2022-04-19 DIAGNOSIS — I82409 Acute embolism and thrombosis of unspecified deep veins of unspecified lower extremity: Secondary | ICD-10-CM

## 2022-04-19 LAB — POCT INR: INR: 2 (ref 2.0–3.0)

## 2022-04-19 NOTE — Patient Instructions (Signed)
-   Continue 1/2 tablet (2.5 mg) daily except 1 tablet (5 mg) on MONDAYS, WEDNESDAYS and FRIDAYS. - Recheck INR in 5 weeks Keep your green leafy vegetables- pick a day (or days)  to have your greens and have them every week 

## 2022-05-09 ENCOUNTER — Telehealth: Payer: Self-pay | Admitting: Internal Medicine

## 2022-05-09 NOTE — Telephone Encounter (Signed)
Left message for patient to call back to schedule Medicare Annual Wellness Visit   Last AWV  05/18/21  Please schedule at anytime with [facility]-Nurse Health Advisor if patient calls the office back.     Any questions, please call me at [phone]  

## 2022-05-15 ENCOUNTER — Telehealth: Payer: Self-pay | Admitting: Pharmacist

## 2022-05-15 ENCOUNTER — Encounter: Payer: Self-pay | Admitting: Pulmonary Disease

## 2022-05-15 ENCOUNTER — Other Ambulatory Visit (HOSPITAL_COMMUNITY): Payer: Self-pay

## 2022-05-15 ENCOUNTER — Ambulatory Visit (INDEPENDENT_AMBULATORY_CARE_PROVIDER_SITE_OTHER): Payer: Medicare Other | Admitting: Pulmonary Disease

## 2022-05-15 VITALS — BP 102/60 | HR 77 | Temp 98.0°F | Ht 68.0 in | Wt 160.4 lb

## 2022-05-15 DIAGNOSIS — J8410 Pulmonary fibrosis, unspecified: Secondary | ICD-10-CM | POA: Diagnosis not present

## 2022-05-15 DIAGNOSIS — Z5181 Encounter for therapeutic drug level monitoring: Secondary | ICD-10-CM

## 2022-05-15 NOTE — Progress Notes (Signed)
Synopsis: Referred in December 2022 for shortness of breath by Billey Gosling, MD  Subjective:   [name]: Apr 17, 1934, MRN: [id number]  HPI  Chief Complaint  Patient presents with   Follow-up    Follow-up no SOB, Cough or Wheezing, Rapid weight lost due to Pirfenidone.   Andrew [name] is an 88 year old man, never smoker with DM II, CAD, complete heart block s/p pacemaker, CKDIII, chronic DVT and hypertension who returns to pulmonary clinic for pulmonary fibrosis.   He continues to lose weight. He weighs 160lbs now compared to 180lbs in February prior to starting pirfenidone. He reports having no appetite but is eating 3 meals per day to take his medication. He denies diarrhea and has constipation due to chronic narcotic use.  CMP looks fine from 04/14/22  OV 02/10/22 ONO did not indicate he needed supplemental oxygen.   He started pirfenidone in early March. He recently saw neurology 02/09/22 for poor balance and generalize muscle weakness. No primary neurologic disorder suspected.  He has tolerated the medication well so far. He reports reduced appetite and has lost 10-15lbs over recent months.  OV 11/24/21 HRCT Chest scan shows widespread areas of mild ground-glass attenuation, septal thickening, subpleural reticulation, septal thickening, traction bronchiectasis and mild honeycombing with craniocaudal gradient and progression from prior scan on 11/2009. These findings are consistent with usual interstitial pneumonia.  Echo 11/08/21 showed normal LV EF 55-60% and grade I diastolic dysfunction. RV systolic function is mildly reduced, with normal size and PASP.  He has been heating the house with a water stove which he loads with wood to burn to heat the water that then is used to heat his home and water. He has been using this source of heat since the 1980s and is exposed to smoke when loading the furnace.   OV 10/11/21 Patient reports progressive  exertional dyspnea over the last 1 year.  He notices it mainly when walking up the steps in his home.  He denies any fatigue associated with the dyspnea.  He is able to rest and recover and then continue on with any further exertional activity.  He denies any chest pain with the shortness of breath.  He denies any changes in lower extremity edema as he has chronic left lower extremity edema due to a chronic DVT.  He was seen by cardiology 08/17/21 with no complaints of dyspnea documented at that time as he did not bring this to their attention. Note reviewed by Dr. Quay Burow, PCP, from 10/03/21 which noted concern for chronic dyspnea with exertion and dry crackles on exam bilaterally.   Patient denies any cough or wheezing with the shortness of breath.  He denies any nighttime awakenings with shortness of breath.  He is a never smoker.  Denies any significant secondhand smoke exposure.  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  He denies any significant dust or chemical exposures throughout his career.  He denies any significant heartburn or reflux symptoms.  Past Medical History:  Diagnosis Date   Colonic polyp 2011 last colo   colo q 60yr - [place]    Coronary artery disease 02/2009   severe left main and three-vessel, CABG   DEGENERATIVE JOINT DISEASE    Diverticulosis of colon    DIVERTICULOSIS, COLON    History of kidney stones    HYPERLIPIDEMIA-MIXED    HYPERTENSION, BENIGN    LBP (low back pain)  s/p decompression 8/14; ESI - [place]; RFA x 3 early 2015   Pacemaker    a. 05/2017: St. Jude (serial number [id number]) pacemaker   Symptomatic bradycardia    a. s/p St. Jude (serial number [id number]) pacemaker 06/16/17   Type II or unspecified type diabetes mellitus without mention of complication, not stated as uncontrolled    VITAMIN D DEFICIENCY      Family History  Problem Relation Age of Onset   Heart failure Mother        died age  88   Heart failure Father        died age 62   Diabetes Other    Heart disease Neg Hx      Social History   Socioeconomic History   Marital status: Married    Spouse name: Not on file   Number of children: Not on file   Years of education: Not on file   Highest education level: Not on file  Occupational History   Not on file  Tobacco Use   Smoking status: Never   Smokeless tobacco: Never   Tobacco comments:    Married, 3 grown children. Retired 04/2002 from [employer] work  Scientific laboratory technician Use: Never used  Substance and Sexual Activity   Alcohol use: Yes    Comment: RARE   Drug use: No   Sexual activity: Not on file  Other Topics Concern   Not on file  Social History Narrative   Not on file   Social Determinants of Health   Financial Resource Strain: Low Risk  (05/18/2021)   Overall Financial Resource Strain (CARDIA)    Difficulty of Paying Living Expenses: Not hard at all  Food Insecurity: No Food Insecurity (05/18/2021)   Hunger Vital Sign    Worried About Running Out of Food in the Last Year: Never true    [place] in the Last Year: Never true  Transportation Needs: No Transportation Needs (05/18/2021)   PRAPARE - Hydrologist (Medical): No    Lack of Transportation (Non-Medical): No  Physical Activity: Sufficiently Active (05/18/2021)   Exercise Vital Sign    Days of Exercise per Week: 5 days    Minutes of Exercise per Session: 30 min  Stress: No Stress Concern Present (05/18/2021)   [place]    Feeling of Stress : Not at all  Social Connections: Moderately Integrated (05/18/2021)   Social Connection and Isolation Panel [NHANES]    Frequency of Communication with Friends and Family: More than three times a week    Frequency of Social Gatherings with Friends and Family: Once a week    Attends Religious Services: Never    Corporate treasurer or Organizations: Yes    Attends Archivist Meetings: 1 to 4 times per year    Marital Status: Married  Human resources officer Violence: Not At Risk (05/18/2021)   Humiliation, Afraid, Rape, and Kick questionnaire    Fear of Current or Ex-Partner: No    Emotionally Abused: No    Physically Abused: No    Sexually Abused: No     Allergies  Allergen Reactions   Penicillins Other (See Comments) and Anaphylaxis    Taste buds     Outpatient Medications Prior to Visit  Medication Sig Dispense Refill   Pirfenidone 801 MG TABS Take 801 mg by mouth 3 (three) times daily with meals. 90 tablet  5   Acetaminophen (TYLENOL 8 HOUR PO) Take 1,000 mg by mouth 3 (three) times daily.     aspirin EC 81 MG tablet Take 81 mg by mouth daily.     atorvastatin (LIPITOR) 20 MG tablet Take 1 tablet (20 mg total) by mouth daily. TAKE 1 TABLET DAILY AT     6:00PM 90 tablet 1   carvedilol (COREG) 6.25 MG tablet TAKE 1 TABLET TWICE DAILY  WITH MEALS 180 tablet 0   cholecalciferol (VITAMIN D) 1000 UNITS tablet Take 1,000 Units by mouth daily.     HYDROcodone-acetaminophen (NORCO/VICODIN) 5-325 MG tablet Take 1 tablet by mouth every 6 (six) hours as needed for moderate pain.     metFORMIN (GLUCOPHAGE-XR) 500 MG 24 hr tablet TAKE 1 TABLET TWICE A DAY 180 tablet 3   Multiple Vitamin (MULTIVITAMIN WITH MINERALS) TABS tablet Take 1 tablet by mouth daily.     niacin (NIASPAN) 1000 MG CR tablet TAKE 1 TABLET AT BEDTIME 90 tablet 3   TURMERIC CURCUMIN PO Take 2,000 mg by mouth in the morning and at bedtime.     warfarin (JANTOVEN) 5 MG tablet TAKE AS DIRECTED BY THE    COUMADIN CLINIC (Patient taking differently: Take 2.5-5 mg by mouth See admin instructions. TAKE AS DIRECTED BY THE COUMADIN CLINIC; 5 mg Mon Wed and Fri, and 2.5 mg all other days) 75 tablet 3   No facility-administered medications prior to visit.   Review of Systems  Constitutional:  Positive for malaise/fatigue and weight loss. Negative for  chills and fever.  HENT:  Negative for congestion, sinus pain and sore throat.   Eyes: Negative.   Respiratory:  Positive for shortness of breath. Negative for cough, hemoptysis, sputum production and wheezing.   Cardiovascular:  Negative for chest pain, palpitations, orthopnea, claudication and leg swelling.  Gastrointestinal:  Negative for abdominal pain, heartburn, nausea and vomiting.  Genitourinary: Negative.   Musculoskeletal:  Negative for joint pain and myalgias.  Skin:  Negative for rash.  Neurological:  Negative for weakness.  Endo/Heme/Allergies: Negative.   Psychiatric/Behavioral: Negative.     Objective:   Vitals:   05/15/22 0858  BP: 102/60  Pulse: 77  Temp: 98 F (36.7 C)  TempSrc: Oral  SpO2: 97%  Weight: 160 lb 6.4 oz (72.8 kg)  Height: 5\' 8"  (1.727 m)   Physical Exam Constitutional:      General: He is not in acute distress. HENT:     Head: Normocephalic and atraumatic.  Cardiovascular:     Rate and Rhythm: Normal rate and regular rhythm.     Pulses: Normal pulses.     Heart sounds: Normal heart sounds. No murmur heard. Pulmonary:     Breath sounds: Rales (bibasilar) present. No wheezing or rhonchi.  Musculoskeletal:     Right lower leg: No edema.     Left lower leg: No edema.  Skin:    General: Skin is warm and dry.  Neurological:     General: No focal deficit present.     Mental Status: He is alert.  Psychiatric:        Mood and Affect: Mood normal.        Behavior: Behavior normal.        Thought Content: Thought content normal.        Judgment: Judgment normal.    CBC    Component Value Date/Time   WBC 6.5 04/03/2022 0925   RBC 3.39 (L) 04/03/2022 0925   HGB 12.1 (L) 04/03/2022 06/03/2022  HCT 35.6 (L) 04/03/2022 0925   PLT 208.0 04/03/2022 0925   MCV 105.2 (H) 04/03/2022 0925   MCH 32.5 06/16/2017 0546   MCHC 33.8 04/03/2022 0925   RDW 15.4 04/03/2022 0925   LYMPHSABS 1.1 04/03/2022 0925   MONOABS 0.5 04/03/2022 0925   EOSABS 0.3  04/03/2022 0925   BASOSABS 0.0 04/03/2022 0925      Latest Ref Rng & Units 04/13/2022    9:16 AM 04/03/2022    9:25 AM 03/10/2022    9:17 AM  BMP  Glucose 70 - 99 mg/dL 409  735  329   BUN 6 - 23 mg/dL 27  27  28    Creatinine 0.40 - 1.50 mg/dL  9.24  2.68   Sodium 135 - 145 mEq/L 139  140  141   Potassium 3.5 - 5.1 mEq/L 4.3  4.8  4.2   Chloride 96 - 112 mEq/L 106  105  107   CO2 19 - 32 mEq/L 26  28  26    Calcium 8.4 - 10.5 mg/dL 9.4  9.5  9.5    Chest imaging: HRCT Chest 10/19/21 1. The appearance of the lungs is compatible with interstitial lung disease, with a spectrum of findings considered diagnostic of usual interstitial pneumonia (UIP) per current ATS guidelines, as detailed above. 2. Aortic atherosclerosis, in addition to left main and three-vessel coronary artery disease. Status post median sternotomy for CABG including LIMA to the LAD. 3. There are calcifications of the aortic valve. Echocardiographic correlation for evaluation of potential valvular dysfunction may be warranted if clinically indicated. Small hiatal hernia.  CT Abd/Pelvis w/ contrast 05/06/21 Lower chest: Mild scarring at both lung bases. There is mild cardiomegaly status post median sternotomy. Pacemaker leads are seen within the right atrium and right ventricle. No significant pleural or pericardial effusion. There is a small hiatal hernia.  PFT:    Latest Ref Rng & Units 11/24/2021   11:01 AM  PFT Results  FVC-Pre L 2.81   FVC-Predicted Pre % 80   FVC-Post L 2.85   FVC-Predicted Post % 82   Pre FEV1/FVC % % 84   Post FEV1/FCV % % 86   FEV1-Pre L 2.36   FEV1-Predicted Pre % 98   FEV1-Post L 2.44   DLCO uncorrected ml/min/mmHg 15.24   DLCO UNC% % 68   DLCO corrected ml/min/mmHg 15.24   DLCO COR %Predicted % 68   DLVA Predicted % 84   TLC L 4.95   TLC % Predicted % 74   RV % Predicted % 77     Labs:  Path:  Echo 11/08/21 showed normal LV EF 55-60% and grade I diastolic  dysfunction. RV systolic function is mildly reduced, with normal size and PASP.  Heart Catheterization:  Assessment & Plan:   Pulmonary fibrosis (HCC)  Discussion: Andrew [name] is an 88 year old man, never smoker with DM II, CAD, complete heart block s/p pacemaker, CKDIII, chronic DVT and hypertension who returns to pulmonary clinic for pulmonary fibrosis.    He has pulmonary fibrosis as noted by his HRCT Chest. He has mild restrictive defect and mild diffusion defect based on pulmonary function tests.   He started pirfenidone in early March and has tolerated 3 tablets (801mg ) three times daily but has lost 20lbs since starting this medication. He has side effect of anorexia.  We will reach out to our pharmacy team about switching him to nintedanib and monitoring if he tolerates this medication better.   He does  not require supplemental oxygen at this time based on simple walk at last visit and ONO.   He is to follow-up in 3 months.  Freda Jackson, MD [facility] Office: [phone]   Current Outpatient Medications:    Pirfenidone 801 MG TABS, Take 801 mg by mouth 3 (three) times daily with meals., Disp: 90 tablet, Rfl: 5   Acetaminophen (TYLENOL 8 HOUR PO), Take 1,000 mg by mouth 3 (three) times daily., Disp: , Rfl:    aspirin EC 81 MG tablet, Take 81 mg by mouth daily., Disp: , Rfl:    atorvastatin (LIPITOR) 20 MG tablet, Take 1 tablet (20 mg total) by mouth daily. TAKE 1 TABLET DAILY AT     6:00PM, Disp: 90 tablet, Rfl: 1   carvedilol (COREG) 6.25 MG tablet, TAKE 1 TABLET TWICE DAILY  WITH MEALS, Disp: 180 tablet, Rfl: 0   cholecalciferol (VITAMIN D) 1000 UNITS tablet, Take 1,000 Units by mouth daily., Disp: , Rfl:    HYDROcodone-acetaminophen (NORCO/VICODIN) 5-325 MG tablet, Take 1 tablet by mouth every 6 (six) hours as needed for moderate pain., Disp: , Rfl:    metFORMIN (GLUCOPHAGE-XR) 500 MG 24 hr tablet, TAKE 1 TABLET TWICE A DAY, Disp: 180 tablet,  Rfl: 3   Multiple Vitamin (MULTIVITAMIN WITH MINERALS) TABS tablet, Take 1 tablet by mouth daily., Disp: , Rfl:    niacin (NIASPAN) 1000 MG CR tablet, TAKE 1 TABLET AT BEDTIME, Disp: 90 tablet, Rfl: 3   TURMERIC CURCUMIN PO, Take 2,000 mg by mouth in the morning and at bedtime., Disp: , Rfl:    warfarin (JANTOVEN) 5 MG tablet, TAKE AS DIRECTED BY THE    COUMADIN CLINIC (Patient taking differently: Take 2.5-5 mg by mouth See admin instructions. TAKE AS DIRECTED BY THE COUMADIN CLINIC; 5 mg Mon Wed and Fri, and 2.5 mg all other days), Disp: 75 tablet, Rfl: 3

## 2022-05-15 NOTE — Patient Instructions (Addendum)
Complete your current pirfendione prescription.  I will speak with our pharmacy team about switching you to [facility] and seeing if you tolerate that medication better.   Follow up in 3 months

## 2022-05-15 NOTE — Telephone Encounter (Addendum)
Please start Ofev BIV. Dose will be 150mg  twice daily.  CMET on 04/13/22 shows stable LFTs  When pirfenidone was started for patient, he had two insurance plans. [facility] 04/15[facility]. Appears to have [employer] Part D plan now. Unsure if these are coordinated benefits (unlikely)  Programme researcher, broadcasting/film/video, PharmD, MPH, BCPS, CPP Clinical Pharmacist (Rheumatology and Pulmonology)  ----- Message from Chesley Mires, RPH-CPP sent at 05/15/2022 10:16 AM EDT ----- Regarding: RE: Switching from [facility] to [facility] The application should be filled out if he's still there.  I will call him once approved to provide counseling. Thanks! ----- Message ----- From: [facility], CMA Sent: 05/15/2022   9:57 AM EDT To: Rx Rheum/Pulm Subject: Switching from Es[place]riet to 05/17/2022 pharmacy team!   Dr. Virgel Paling saw Mr. Schurman this morning for a follow up. He is currently on Es[place]riet and is not doing well with the side effects. Dr. Lin Givens would like for him to switch to [facility].   He wanted to know if we needed to get him scheduled with [facility] to discuss Ofev or just have him to fill out and sign the application.

## 2022-05-15 NOTE — Telephone Encounter (Signed)
Submitted a Prior Authorization request to [facility] for OFEV via CoverMyMeds. Will update once we receive a response.   Key: [id number]

## 2022-05-18 NOTE — Telephone Encounter (Signed)
Received fax from [facility] requesting confirmation on dx.  Fax: [phone] [place] Case # [id number]  Chesley Mires, PharmD, MPH, BCPS, CPP Clinical Pharmacist (Rheumatology and Pulmonology)

## 2022-05-19 ENCOUNTER — Other Ambulatory Visit (HOSPITAL_COMMUNITY): Payer: Self-pay

## 2022-05-19 NOTE — Telephone Encounter (Unsigned)
Received notification from [facility] regarding a prior authorization for OFEV. Authorization has been APPROVED from 05/18/2022 to 05/19/2023. Approval letter sent to scan center.  Patient must fill through [facility] (pulmonary fibrosis team): [phone]  Authorization # [id number]   Pt appears to have Medicare coverage, however Part D is not included (card indicates Rx billing info is for Part B ONLY). Test claim through pt's pharmacy benefits confirms that pt is eligible for manufacturer copay card.

## 2022-05-22 ENCOUNTER — Other Ambulatory Visit (HOSPITAL_COMMUNITY): Payer: Self-pay

## 2022-05-22 NOTE — Telephone Encounter (Signed)
Reached out to pt and discussed next steps, confirmed with pt that I had his permission to sign and submit enrollment application on his behalf. Phone number to [facility] program provided to him, advised that if he has not heard from them in the next 24 hours that he can reach out himself to get process rolling. Requested that he reach out to [place] here at the clinic if he has any questions or concerns in the meantime. Pt verbalized understanding to all and had no further questions at this time.

## 2022-05-24 ENCOUNTER — Ambulatory Visit (INDEPENDENT_AMBULATORY_CARE_PROVIDER_SITE_OTHER): Payer: Medicare Other

## 2022-05-24 DIAGNOSIS — I82409 Acute embolism and thrombosis of unspecified deep veins of unspecified lower extremity: Secondary | ICD-10-CM | POA: Diagnosis not present

## 2022-05-24 DIAGNOSIS — Z5181 Encounter for therapeutic drug level monitoring: Secondary | ICD-10-CM

## 2022-05-24 LAB — POCT INR: INR: 1.6 — AB (ref 2.0–3.0)

## 2022-05-24 NOTE — Patient Instructions (Signed)
-  TAKE 2 TABLETS TODAY ONLY - Continue 1/2 tablet (2.5 mg) daily except 1 tablet (5 mg) on MONDAYS, WEDNESDAYS and FRIDAYS. - Recheck INR in 1 week Keep your green leafy vegetables- pick a day (or days)  to have your greens and have them every week

## 2022-05-29 ENCOUNTER — Encounter: Payer: Medicare Other | Admitting: Internal Medicine

## 2022-05-29 ENCOUNTER — Ambulatory Visit: Payer: Medicare Other

## 2022-05-29 VITALS — BP 124/64 | HR 69 | Resp 18 | Ht 68.0 in | Wt 163.0 lb

## 2022-05-29 DIAGNOSIS — Z0000 Encounter for general adult medical examination without abnormal findings: Secondary | ICD-10-CM

## 2022-05-29 NOTE — Patient Instructions (Signed)
It was great seeing you today.   I hope your wife starts to feel better soon   Good luck with your gardening

## 2022-05-29 NOTE — Progress Notes (Signed)
Subjective:   Andrew [name] is a 88 y.o. male who presents for Medicare Annual/Subsequent preventive examination.  Review of Systems           Objective:    Today's Vitals   05/29/22 1406  BP: 124/64  Pulse: 69  Resp: 18  SpO2: 95%  Weight: 163 lb (73.9 kg)  Height: 5\' 8"  (1.727 m)   Body mass index is 24.78 kg/m.     06/03/2021    6:46 AM 05/19/2021    8:11 AM 05/18/2021    9:09 AM 09/27/2017   10:14 AM 06/15/2017    2:46 PM 09/26/2016    8:17 AM 06/19/2013    6:06 AM  Advanced Directives  Does Patient Have a Medical Advance Directive? Yes Yes Yes Yes Yes Yes   Type of Advance Directive Living will Living will Living will Healthcare Power of [place];Living will Living will;Healthcare Power of [employer] Street Corporationttorney Healthcare Power of [place]orney;Living will   Does patient want to make changes to medical advance directive? No - Patient declined  No - Patient declined  No - Patient declined No - Patient declined   Copy of Healthcare Power of Attorney in Chart?    No - copy requested No - copy requested No - copy requested   Pre-existing out of facility DNR order (yellow form or pink MOST form)       No    Current Medications (verified) Outpatient Encounter Medications as of 05/29/2022  Medication Sig   Acetaminophen (TYLENOL 8 HOUR PO) Take 1,000 mg by mouth 3 (three) times daily.   aspirin EC 81 MG tablet Take 81 mg by mouth daily.   atorvastatin (LIPITOR) 20 MG tablet Take 1 tablet (20 mg total) by mouth daily. TAKE 1 TABLET DAILY AT     6:00PM   carvedilol (COREG) 6.25 MG tablet TAKE 1 TABLET TWICE DAILY  WITH MEALS   cholecalciferol (VITAMIN D) 1000 UNITS tablet Take 1,000 Units by mouth daily.   HYDROcodone-acetaminophen (NORCO/VICODIN) 5-325 MG tablet Take 1 tablet by mouth every 6 (six) hours as needed for moderate pain.   metFORMIN (GLUCOPHAGE-XR) 500 MG 24 hr tablet TAKE 1 TABLET TWICE A DAY   Multiple Vitamin (MULTIVITAMIN WITH MINERALS) TABS tablet Take 1 tablet by mouth  daily.   niacin (NIASPAN) 1000 MG CR tablet TAKE 1 TABLET AT BEDTIME   TURMERIC CURCUMIN PO Take 2,000 mg by mouth in the morning and at bedtime.   warfarin (JANTOVEN) 5 MG tablet TAKE AS DIRECTED BY THE    COUMADIN CLINIC (Patient taking differently: Take 2.5-5 mg by mouth See admin instructions. TAKE AS DIRECTED BY THE COUMADIN CLINIC; 5 mg Mon Wed and Fri, and 2.5 mg all other days)   Pirfenidone 801 MG TABS Take 801 mg by mouth 3 (three) times daily with meals. (Patient not taking: Reported on 05/29/2022)   No facility-administered encounter medications on file as of 05/29/2022.    Allergies (verified) Penicillins   History: Past Medical History:  Diagnosis Date   Colonic polyp 2011 last colo   colo q 3137yr - [facility]    Coronary artery disease 02/2009   severe left main and three-vessel, CABG   DEGENERATIVE JOINT DISEASE    Diverticulosis of colon    DIVERTICULOSIS, COLON    History of kidney stones    HYPERLIPIDEMIA-MIXED    HYPERTENSION, BENIGN    LBP (low back pain)    s/p decompression 8/14; ESI - [place]; RFA x 3 early 2015  Pacemaker    a. 05/2017: St. Jude (serial number [id number]) pacemaker   Symptomatic bradycardia    a. s/p St. Jude (serial number [id number]) pacemaker 06/16/17   Type II or unspecified type diabetes mellitus without mention of complication, not stated as uncontrolled    VITAMIN D DEFICIENCY    Past Surgical History:  Procedure Laterality Date   CARPAL TUNNEL RELEASE Right    CATARACT EXTRACTION, BILATERAL     R 07/07/13, L 07/21/13   CORONARY ARTERY BYPASS GRAFT  03/17/09   x 4   CYSTOSCOPY WITH STENT PLACEMENT Left 05/10/2021   Procedure: CYSTOSCOPY WITH LEFT  STENT PLACEMENT,LEFT RETROGRADE PYELOGRAM;  Surgeon: Crist Fat, MD;  Location: [facility];  Service: Urology;  Laterality: Left;   CYSTOSCOPY/URETEROSCOPY/HOLMIUM LASER/STENT PLACEMENT Left 06/03/2021   Procedure: CYSTOSCOPY, LEFT RETROGRADE, URETEROSCOPY,HOLMIUM LASER,STENT EXCHANGE;  Surgeon:  Crist Fat, MD;  Location: [facility];  Service: Urology;  Laterality: Left;   INGUINAL HERNIA REPAIR  1980   KNEE ARTHROSCOPY  2007   right   LUMBAR EPIDURAL INJECTION  2013   Has as needed.   LUMBAR LAMINECTOMY/DECOMPRESSION MICRODISCECTOMY N/A 06/19/2013   Procedure: LUMBAR LAMINECTOMY/DECOMPRESSION MICRODISCECTOMY;  Surgeon: Emilee Hero, MD;  Location: [facility];  Service: Orthopedics;  Laterality: N/A;  Lumbar 4-5 decompression   PACEMAKER IMPLANT N/A 06/16/2017   Procedure: Pacemaker Implant;  Surgeon: Marinus Maw, MD;  Location: [facility] INVASIVE CV LAB;  Service: Cardiovascular;  Laterality: N/A;   TRIGGER FINGER RELEASE Right    Family History  Problem Relation Age of Onset   Heart failure Mother        died age 94   Heart failure Father        died age 21   Diabetes Other    Heart disease Neg Hx    Social History   Socioeconomic History   Marital status: Married    Spouse name: Not on file   Number of children: Not on file   Years of education: Not on file   Highest education level: Not on file  Occupational History   Not on file  Tobacco Use   Smoking status: Never   Smokeless tobacco: Never   Tobacco comments:    Married, 3 grown children. 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 used  Substance and Sexual Activity   Alcohol use: Yes    Comment: RARE   Drug use: No   Sexual activity: Not on file  Other Topics Concern   Not on file  Social History Narrative   Not on file   Social Determinants of Health   Financial Resource Strain: Low Risk  (05/18/2021)   Overall Financial Resource Strain (CARDIA)    Difficulty of Paying Living Expenses: Not hard at all  Food Insecurity: No Food Insecurity (05/18/2021)   Hunger Vital Sign    Worried About Running Out of Food in the Last Year: Never true    Ran Out of Food in the Last Year: Never true  Transportation Needs: No Transportation Needs (05/18/2021)   PRAPARE  - Administrator, Civil Service (Medical): No    Lack of Transportation (Non-Medical): No  Physical Activity: Sufficiently Active (05/18/2021)   Exercise Vital Sign    Days of Exercise per Week: 5 days    Minutes of Exercise per Session: 30 min  Stress: No Stress Concern Present (05/18/2021)   [employer] of Occupational Health - Occupational Stress Questionnaire  Feeling of Stress : Not at all  Social Connections: Moderately Integrated (05/18/2021)   Social Connection and Isolation Panel [NHANES]    Frequency of Communication with Friends and Family: More than three times a week    Frequency of Social Gatherings with Friends and Family: Once a week    Attends Religious Services: Never    Database administrator or Organizations: Yes    Attends Banker Meetings: 1 to 4 times per year    Marital Status: Married    Tobacco Counseling Counseling given: Not Answered Tobacco comments: Married, 3 grown children. Retired 04/2002 from [employer] work   Clinical Intake: Patient presented today in clinic with no issues, just a little hard of hearing. Patient does have hearing aids but doesn't wear them because they are not comfortable. He is in the process of finding a new comfortable pair.                  Diabetic?Yes           Patient Care Team: Pincus Sanes, MD as PCP - General (Internal Medicine) Lunette Stands, MD as Consulting Physician (Orthopedic Surgery) Loreta Ave, MD (Inactive) as Consulting Physician (Orthopedic Surgery) Antonieta Iba, MD as Consulting Physician (Cardiology) Estill Bamberg, MD (Orthopedic Surgery) Hanley Seamen Dustin Folks, MD (Optometry) Carman Ching, MD (Inactive) (Gastroenterology) Donzetta Starch, MD as Consulting Physician (Dermatology)  Indicate any recent Medical Services you may have received from other than [facility] providers in the past year (date may be approximate).     Assessment:    This is a routine wellness examination for [place].  Hearing/Vision screen No results found.  Dietary issues and exercise activities discussed:     Goals Addressed   None   Depression Screen    04/03/2022    8:44 AM 12/30/2021   11:34 AM 05/18/2021    9:25 AM 04/01/2021    8:06 AM 10/01/2019    7:59 AM 04/01/2019    7:48 AM 09/30/2018    7:53 AM  PHQ 2/9 Scores  PHQ - 2 Score 0 0 1 0 0 0 0  PHQ- 9 Score 4          Fall Risk    12/30/2021   11:34 AM 05/18/2021    9:11 AM 04/01/2021    8:06 AM 10/01/2019    7:59 AM 04/01/2019    7:48 AM  Fall Risk   Falls in the past year? 0 0 0 0 0  Number falls in past yr: 0 0 0 0 0  Injury with Fall? 0 0 0    Risk for fall due to : No Fall Risks No Fall Risks No Fall Risks    Follow up Falls evaluation completed Falls evaluation completed Falls evaluation completed      FALL RISK PREVENTION PERTAINING TO THE HOME:  Any stairs in or around the home? Yes  If so, are there any without handrails? No  Home free of loose throw rugs in walkways, pet beds, electrical cords, etc? Yes  Adequate lighting in your home to reduce risk of falls? Yes   ASSISTIVE DEVICES UTILIZED TO PREVENT FALLS:  Life alert? No  Use of a cane, walker or w/c? No  Grab bars in the bathroom? No  Shower chair or bench in shower? No  Elevated toilet seat or a handicapped toilet? No   TIMED UP AND GO:  Was the test performed? No .  Length of time to  ambulate 10 feet:  sec.   Gait steady and fast without use of assistive device  Cognitive Function:    09/27/2017   10:43 AM  MMSE - Mini Mental State Exam  Orientation to time 5  Orientation to Place 5  Registration 3  Attention/ Calculation 5  Recall 2  Language- name 2 objects 2  Language- repeat 1  Language- follow 3 step command 3  Language- read & follow direction 1  Write a sentence 1  Copy design 1  Total score 29        Immunizations Immunization History  Administered Date(s) Administered    Influenza Split 09/26/2012   Influenza, High Dose Seasonal PF 07/30/2018, 07/02/2019, 09/06/2020   Influenza, Seasonal, Injecte, Preservative Fre 07/23/2013   Influenza-Unspecified 09/24/2014, 07/24/2015, 08/04/2016, 08/13/2017, 08/09/2021   PFIZER(Purple Top)SARS-COV-2 Vaccination 12/01/2019, 12/24/2019, 09/06/2020   PNEUMOCOCCAL CONJUGATE-20 10/03/2021   Pneumococcal Conjugate-13 09/22/2015   Pneumococcal-Unspecified 09/25/2011   Td 11/07/2002, 03/26/2013    TDAP status: Up to date  Flu Vaccine status: Up to date  Pneumococcal vaccine status: Up to date  Covid-19 vaccine status: Information provided on how to obtain vaccines.   Qualifies for Shingles Vaccine? Yes   Zostavax completed No   Shingrix Completed?: No.    Education has been provided regarding the importance of this vaccine. Patient has been advised to call insurance company to determine out of pocket expense if they have not yet received this vaccine. Advised may also receive vaccine at local pharmacy or Health Dept. Verbalized acceptance and understanding.  Screening Tests Health Maintenance  Topic Date Due   Zoster Vaccines- Shingrix (1 of 2) Never done   FOOT EXAM  07/06/2019   COVID-19 Vaccine (4 - Pfizer series) 11/01/2020   INFLUENZA VACCINE  05/23/2022   OPHTHALMOLOGY EXAM  09/01/2022   HEMOGLOBIN A1C  10/03/2022   TETANUS/TDAP  03/27/2023   Diabetic kidney evaluation - Urine ACR  04/04/2023   Diabetic kidney evaluation - GFR measurement  04/14/2023   Pneumonia Vaccine 12+ Years old  Completed   HPV VACCINES  Aged Out   COLONOSCOPY (Pts 45-68yrs Insurance coverage will need to be confirmed)  Discontinued    Health Maintenance  Health Maintenance Due  Topic Date Due   Zoster Vaccines- Shingrix (1 of 2) Never done   FOOT EXAM  07/06/2019   COVID-19 Vaccine (4 - Pfizer series) 11/01/2020   INFLUENZA VACCINE  05/23/2022    Colorectal cancer screening: No longer required.   Lung Cancer Screening:  (Low Dose CT Chest recommended if Age 11-80 years, 30 pack-year currently smoking OR have quit w/in 15years.) does not qualify.   Lung Cancer Screening Referral:   Additional Screening:  Hepatitis C Screening: does not qualify; Completed   Vision Screening: Recommended annual ophthalmology exams for early detection of glaucoma and other disorders of the eye. Is the patient up to date with their annual eye exam?  Yes  Who is the provider or what is the name of the office in which the patient attends annual eye exams? [facility] If pt is not established with a provider, would they like to be referred to a provider to establish care? Yes .   Dental Screening: Recommended annual dental exams for proper oral hygiene  Community Resource Referral / Chronic Care Management: CRR required this visit?  No   CCM required this visit?  No      Plan:     I have personally reviewed and noted the following in  the patient's chart:   Medical and social history Use of alcohol, tobacco or illicit drugs  Current medications and supplements including opioid prescriptions. Patient is currently taking opioid prescriptions. Information provided to patient regarding non-opioid alternatives. Patient advised to discuss non-opioid treatment plan with their provider. Functional ability and status Nutritional status Physical activity Advanced directives List of other physicians Hospitalizations, surgeries, and ER visits in previous 12 months Vitals Screenings to include cognitive, depression, and falls Referrals and appointments  In addition, I have reviewed and discussed with patient certain preventive protocols, quality metrics, and best practice recommendations. A written personalized care plan for preventive services as well as general preventive health recommendations were provided to patient.     Manuela Schwartz, CMA   05/29/2022   Nurse Notes: Patient has been advised to call if anything  changes. He verbalized understanding.

## 2022-05-31 ENCOUNTER — Ambulatory Visit (INDEPENDENT_AMBULATORY_CARE_PROVIDER_SITE_OTHER): Payer: Medicare Other

## 2022-05-31 DIAGNOSIS — Z5181 Encounter for therapeutic drug level monitoring: Secondary | ICD-10-CM

## 2022-05-31 DIAGNOSIS — I82409 Acute embolism and thrombosis of unspecified deep veins of unspecified lower extremity: Secondary | ICD-10-CM | POA: Diagnosis not present

## 2022-05-31 LAB — POCT INR: INR: 2.5 (ref 2.0–3.0)

## 2022-05-31 NOTE — Patient Instructions (Signed)
-   Continue 1/2 tablet (2.5 mg) daily except 1 tablet (5 mg) on MONDAYS, WEDNESDAYS and FRIDAYS. - Recheck INR in 4 weeks Keep your green leafy vegetables- pick a day (or days)  to have your greens and have them every week

## 2022-06-01 ENCOUNTER — Other Ambulatory Visit: Payer: Self-pay | Admitting: Cardiovascular Disease

## 2022-06-01 ENCOUNTER — Other Ambulatory Visit: Payer: Self-pay | Admitting: Internal Medicine

## 2022-06-01 NOTE — Telephone Encounter (Signed)
Good Morning,  Could you schedule this patient a 12 month follow up visit? He was last seen by Dr. Mariah Milling 05-17-21.  Thank you so much,  Izaan Kingbird.

## 2022-06-02 ENCOUNTER — Encounter: Payer: Self-pay | Admitting: Pulmonary Disease

## 2022-06-02 NOTE — Telephone Encounter (Signed)
Scheduled

## 2022-06-02 NOTE — Telephone Encounter (Signed)
Mychart message sent by pt: [facility] (supporting Martina Sinner, MD) 3 hours ago (11:19 AM)    I have not heard or seen anything about my new meds,ofev. thanks,Andrew [name]     With [facility] being out of the office currently, [place], please advise if you are able to help [place] out with this.

## 2022-06-02 NOTE — Telephone Encounter (Signed)
Attempted to schedule patient not home and wants a call back at 9

## 2022-06-08 MED ORDER — OFEV 150 MG PO CAPS: 150.0000 mg | ORAL_CAPSULE | Freq: Two times a day (BID) | ORAL | 1 refills | Status: DC

## 2022-06-08 NOTE — Telephone Encounter (Signed)
Rx for Ofev 150mg  twice daily sent to [facility]. Patient in process of [employer] enrollment for copay card  , PharmD, MPH, BCPS, CPP Clinical Pharmacist (Rheumatology and Pulmonology)

## 2022-06-14 ENCOUNTER — Ambulatory Visit (INDEPENDENT_AMBULATORY_CARE_PROVIDER_SITE_OTHER): Payer: Medicare Other

## 2022-06-14 DIAGNOSIS — I442 Atrioventricular block, complete: Secondary | ICD-10-CM

## 2022-06-14 NOTE — Telephone Encounter (Signed)
Confirmed with [facility] that patient is scheduled to receive Ofev shipment tomorrow 06/15/22. Prescription was also processed through copay card  Chesley Mires, PharmD, MPH, BCPS, CPP Clinical Pharmacist (Rheumatology and Pulmonology)

## 2022-06-14 NOTE — Telephone Encounter (Signed)
Confirmed with [facility] that patient is scheduled to receive Ofev shipment tomorrow 06/15/22. Prescription was also processed through copay card. Closing encounter  Chesley Mires, PharmD, MPH, BCPS, CPP Clinical Pharmacist (Rheumatology and Pulmonology)

## 2022-06-15 LAB — CUP PACEART REMOTE DEVICE CHECK
Battery Remaining Longevity: 68 mo
Battery Remaining Percentage: 56 %
Battery Voltage: 2.99 V
Brady Statistic AP VP Percent: 7.8 %
Brady Statistic AP VS Percent: 1 %
Brady Statistic AS VP Percent: 92 %
Brady Statistic AS VS Percent: 1 %
Brady Statistic RA Percent Paced: 7.5 %
Brady Statistic RV Percent Paced: 99 %
Date Time Interrogation Session: 20230823134304
Implantable Lead Implant Date: 20180825
Implantable Lead Implant Date: 20180825
Implantable Lead Location: 753859
Implantable Lead Location: 753860
Implantable Pulse Generator Implant Date: 20180825
Lead Channel Impedance Value: 430 Ohm
Lead Channel Impedance Value: 580 Ohm
Lead Channel Pacing Threshold Amplitude: 0.5 V
Lead Channel Pacing Threshold Amplitude: 0.875 V
Lead Channel Pacing Threshold Pulse Width: 0.5 ms
Lead Channel Pacing Threshold Pulse Width: 0.5 ms
Lead Channel Sensing Intrinsic Amplitude: 10.2 mV
Lead Channel Sensing Intrinsic Amplitude: 4.5 mV
Lead Channel Setting Pacing Amplitude: 1.125
Lead Channel Setting Pacing Amplitude: 2 V
Lead Channel Setting Pacing Pulse Width: 0.5 ms
Lead Channel Setting Sensing Sensitivity: 4 mV
Pulse Gen Model: 2272
Pulse Gen Serial Number: 8939923

## 2022-06-28 ENCOUNTER — Ambulatory Visit: Payer: Medicare Other | Attending: Internal Medicine

## 2022-06-28 DIAGNOSIS — Z5181 Encounter for therapeutic drug level monitoring: Secondary | ICD-10-CM

## 2022-06-28 DIAGNOSIS — I82409 Acute embolism and thrombosis of unspecified deep veins of unspecified lower extremity: Secondary | ICD-10-CM

## 2022-06-28 LAB — POCT INR: INR: 2.5 (ref 2.0–3.0)

## 2022-06-28 NOTE — Patient Instructions (Signed)
-   Continue 1/2 tablet (2.5 mg) daily except 1 tablet (5 mg) on MONDAYS, WEDNESDAYS and FRIDAYS. - Recheck INR in 6 weeks Keep your green leafy vegetables- pick a day (or days)  to have your greens and have them every week 

## 2022-06-28 NOTE — Telephone Encounter (Signed)
Spoke with patient regarding Ofev. He stared Ofev on 06/16/22.  He states he is tolerating the medication without issue. No episodes of diarrhea or abdominal pain. Reviewed diarrhea management with loperamide (he receive gratuity supply from [facility]) and holding Ofev to allow diarrhea to self-resolve. Can also consider lowering Ofev dose to 100mg  twice daily.  Offered him additional nursing support program education through [employer] however he states he is not interested at this time.  Reviewed small but increased risk of MI, thrombosis, and bleeding with Ofev. He does have history of CAD s/p CABG and DVT. He also takes warfarin.   Reviewed lab monitoring due to risk for hepatotoxicity. He will plan to have labwork completed at the end of September. Future order for CMET placed today.  All questions encouraged and answered.He verbalized understanding.  October, PharmD, MPH, BCPS, CPP Clinical Pharmacist (Rheumatology and Pulmonology)

## 2022-06-28 NOTE — Addendum Note (Signed)
Addended by: Murrell Redden on: 06/28/2022 02:56 PM   Modules accepted: Orders

## 2022-07-11 NOTE — Progress Notes (Signed)
Remote pacemaker transmission.   

## 2022-07-17 ENCOUNTER — Telehealth: Payer: Self-pay | Admitting: Pulmonary Disease

## 2022-07-17 NOTE — Telephone Encounter (Signed)
Patient is completing [employer] Patient Assistance forms and needs Dr. Erin Fulling to complete a section of the forms. Told patient he could drop the forms off and Dr. Erin Fulling could complete and fax off. Patient did not really want to leave forms at the office. Would like a call back to discuss the best time to bring forms in. Patient is aware JD is in office Monday-Thursday this week.  Please call back at [phone].

## 2022-07-18 ENCOUNTER — Other Ambulatory Visit (INDEPENDENT_AMBULATORY_CARE_PROVIDER_SITE_OTHER): Payer: Medicare Other

## 2022-07-18 DIAGNOSIS — Z5181 Encounter for therapeutic drug level monitoring: Secondary | ICD-10-CM

## 2022-07-18 DIAGNOSIS — J8410 Pulmonary fibrosis, unspecified: Secondary | ICD-10-CM

## 2022-07-18 NOTE — Telephone Encounter (Signed)
Called and spoke with patient. He will bring the forms by this afternoon. I advised him to go to the check in desk and ask for me and I would come and get the forms. He verbalized understanding.   Will keep this encounter open for follow up.

## 2022-07-18 NOTE — Telephone Encounter (Signed)
Received forms from front desk. I spoke to the patient and he has requested to have the signed forms mailed back to him once they have been completed. I verified his address.

## 2022-07-19 LAB — COMPREHENSIVE METABOLIC PANEL
ALT: 19 U/L (ref 0–53)
AST: 19 U/L (ref 0–37)
Albumin: 4 g/dL (ref 3.5–5.2)
Alkaline Phosphatase: 46 U/L (ref 39–117)
BUN: 21 mg/dL (ref 6–23)
CO2: 29 mEq/L (ref 19–32)
Calcium: 9.1 mg/dL (ref 8.4–10.5)
Chloride: 103 mEq/L (ref 96–112)
Creatinine, Ser: 1.3 mg/dL (ref 0.40–1.50)
GFR: 49.89 mL/min — ABNORMAL LOW (ref >60.00)
Glucose, Bld: 89 mg/dL (ref 70–99)
Potassium: 4.8 mEq/L (ref 3.5–5.1)
Sodium: 140 mEq/L (ref 135–145)
Total Bilirubin: 0.4 mg/dL (ref 0.2–1.2)
Total Protein: 6.5 g/dL (ref 6.0–8.3)

## 2022-07-19 NOTE — Telephone Encounter (Signed)
Called and spoke with patient. He is aware that I will place the signed Ofev application in the mail. Verified patient's address.   Nothing further needed at time of call.

## 2022-08-09 ENCOUNTER — Ambulatory Visit: Payer: Medicare Other | Attending: Internal Medicine

## 2022-08-09 DIAGNOSIS — I82409 Acute embolism and thrombosis of unspecified deep veins of unspecified lower extremity: Secondary | ICD-10-CM | POA: Diagnosis not present

## 2022-08-09 DIAGNOSIS — Z5181 Encounter for therapeutic drug level monitoring: Secondary | ICD-10-CM | POA: Diagnosis not present

## 2022-08-09 LAB — POCT INR: INR: 2.5 (ref 2.0–3.0)

## 2022-08-09 NOTE — Patient Instructions (Signed)
-   Continue 1/2 tablet (2.5 mg) daily except 1 tablet (5 mg) on MONDAYS, WEDNESDAYS and FRIDAYS. - Recheck INR in 3 weeks Keep your green leafy vegetables- pick a day (or days)  to have your greens and have them every week

## 2022-08-15 ENCOUNTER — Ambulatory Visit (INDEPENDENT_AMBULATORY_CARE_PROVIDER_SITE_OTHER): Payer: Medicare Other | Admitting: Internal Medicine

## 2022-08-15 ENCOUNTER — Ambulatory Visit: Payer: Medicare Other | Admitting: Pulmonary Disease

## 2022-08-15 ENCOUNTER — Encounter: Payer: Self-pay | Admitting: Internal Medicine

## 2022-08-15 VITALS — BP 136/80 | HR 68 | Ht 68.0 in | Wt 158.4 lb

## 2022-08-15 DIAGNOSIS — J8410 Pulmonary fibrosis, unspecified: Secondary | ICD-10-CM | POA: Diagnosis not present

## 2022-08-15 LAB — CBC WITH DIFFERENTIAL/PLATELET
Basophils Absolute: 0 10*3/uL (ref 0.0–0.1)
Basophils Relative: 0.6 % (ref 0.0–3.0)
Eosinophils Absolute: 0.7 10*3/uL (ref 0.0–0.7)
Eosinophils Relative: 11.3 % — ABNORMAL HIGH (ref 0.0–5.0)
HCT: 34.8 % — ABNORMAL LOW (ref 39.0–52.0)
Hemoglobin: 11.4 g/dL — ABNORMAL LOW (ref 13.0–17.0)
Lymphocytes Relative: 22.1 % (ref 12.0–46.0)
Lymphs Abs: 1.3 10*3/uL (ref 0.7–4.0)
MCHC: 32.6 g/dL (ref 30.0–36.0)
MCV: 108.2 fl — ABNORMAL HIGH (ref 78.0–100.0)
Monocytes Absolute: 0.4 10*3/uL (ref 0.1–1.0)
Monocytes Relative: 7.2 % (ref 3.0–12.0)
Neutro Abs: 3.4 10*3/uL (ref 1.4–7.7)
Neutrophils Relative %: 58.8 % (ref 43.0–77.0)
Platelets: 186 10*3/uL (ref 150.0–400.0)
RBC: 3.22 Mil/uL — ABNORMAL LOW (ref 4.22–5.81)
RDW: 16 % — ABNORMAL HIGH (ref 11.5–15.5)
WBC: 5.8 10*3/uL (ref 4.0–10.5)

## 2022-08-15 NOTE — Progress Notes (Signed)
Synopsis: Referred in December 2022 for shortness of breath by Billey Gosling, MD  Subjective:   [name] GENDER: male DOB: 1934/03/07, MRN: [id number]  HPI  Chief Complaint  Patient presents with   Follow-up    Follow-up no SOB, Cough or Wheezing, Rapid weight lost due to Pirfenidone.   [[[Andrew [name]- Krischan Dillahunt is an 88 year old man, never smoker with DM II, CAD, complete heart block s/p pacemaker, CKDIII, chronic DVT and hypertension who returns to pulmonary clinic for pulmonary fibrosis.   He continues to lose weight. He weighs 160lbs now compared to 180lbs in February prior to starting pirfenidone. He reports having no appetite but is eating 3 meals per day to take his medication. He denies diarrhea and has constipation due to chronic narcotic use.  CMP looks fine from 04/14/22  OV 02/10/22 ONO did not indicate he needed supplemental oxygen.   He started pirfenidone in early March. He recently saw neurology 02/09/22 for poor balance and generalize muscle weakness. No primary neurologic disorder suspected.  He has tolerated the medication well so far. He reports reduced appetite and has lost 10-15lbs over recent months.  OV 11/24/21 HRCT Chest scan shows widespread areas of mild ground-glass attenuation, septal thickening, subpleural reticulation, septal thickening, traction bronchiectasis and mild honeycombing with craniocaudal gradient and progression from prior scan on 11/2009. These findings are consistent with usual interstitial pneumonia.  Echo 11/08/21 showed normal LV EF 55-60% and grade I diastolic dysfunction. RV systolic function is mildly reduced, with normal size and PASP.  He has been heating the house with a water stove which he loads with wood to burn to heat the water that then is used to heat his home and water. He has been using this source of heat since the 1980s and is exposed to smoke when loading the furnace.   OV 10/11/21 Patient reports  progressive exertional dyspnea over the last 1 year.  He notices it mainly when walking up the steps in his home.  He denies any fatigue associated with the dyspnea.  He is able to rest and recover and then continue on with any further exertional activity.  He denies any chest pain with the shortness of breath.  He denies any changes in lower extremity edema as he has chronic left lower extremity edema due to a chronic DVT.  He was seen by cardiology 08/17/21 with no complaints of dyspnea documented at that time as he did not bring this to their attention. Note reviewed by Andrew. Quay Burow, PCP, from 10/03/21 which noted concern for chronic dyspnea with exertion and dry crackles on exam bilaterally.   Patient denies any cough or wheezing with the shortness of breath.  He denies any nighttime awakenings with shortness of breath.  He is a never smoker.  Denies any significant secondhand smoke exposure.  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  He denies any significant dust or chemical exposures throughout his career.  He denies any significant heartburn or reflux symptoms.]]]  OV 08/15/22- Ramani Riva   Andrew [name] (unavailable today) had changed pt from [place] to [facility] because of weight loss, but patient says weight loss continues. Approx 6 months ago said usual weight 175. Body weight today 158 lbs, arrival O2 sat 98% room air. His primary observation is loss of appetite without change in taste or smell recognized. He notes some incidental bruising on his arms but otherwise denies increased dyspnea, change  in his cough, abdominal pain, nausea or diarrhea.  No fever, adenopathy or frank bleeding.  No other medicines have been changed. He feels weaker and somewhat unsteady with weight loss and does not feel he can operate a chainsaw safely which is a concern because he heats his home with wood. CMET ok 9/26.  He had not had COVID infection in the past  year.  Past Medical History:  Diagnosis Date   Colonic polyp 2011 last colo   colo q 12yr - [facility]    Coronary artery disease 02/2009   severe left main and three-vessel, CABG   DEGENERATIVE JOINT DISEASE    Diverticulosis of colon    DIVERTICULOSIS, COLON    History of kidney stones    HYPERLIPIDEMIA-MIXED    HYPERTENSION, BENIGN    LBP (low back pain)    s/p decompression 8/14; ESI - [place]; RFA x 3 early 2015   Pacemaker    a. 05/2017: St. Jude (serial number [id number]) pacemaker   Symptomatic bradycardia    a. s/p St. Jude (serial number [id number]) pacemaker 06/16/17   Type II or unspecified type diabetes mellitus without mention of complication, not stated as uncontrolled    VITAMIN D DEFICIENCY      Family History  Problem Relation Age of Onset   Heart failure Mother        died age 50   Heart failure Father        died age 61   Diabetes Other    Heart disease Neg Hx      Social History   Socioeconomic History   Marital status: Married    Spouse name: Not on file   Number of children: Not on file   Years of education: Not on file   Highest education level: Not on file  Occupational History   Not on file  Tobacco Use   Smoking status: Never   Smokeless tobacco: Never   Tobacco comments:    Married, 3 grown children. 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 used  Substance and Sexual Activity   Alcohol use: Yes    Comment: RARE   Drug use: No   Sexual activity: Not on file  Other Topics Concern   Not on file  Social History Narrative   Not on file   Social Determinants of Health   Financial Resource Strain: Low Risk  (05/18/2021)   Overall Financial Resource Strain (CARDIA)    Difficulty of Paying Living Expenses: Not hard at all  Food Insecurity: No Food Insecurity (05/18/2021)   Hunger Vital Sign    Worried About Running Out of Food in the Last Year: Never true    Ran Out of Food in the Last Year: Never true   Transportation Needs: No Transportation Needs (05/18/2021)   PRAPARE - Administrator, Civil Service (Medical): No    Lack of Transportation (Non-Medical): No  Physical Activity: Sufficiently Active (05/18/2021)   Exercise Vital Sign    Days of Exercise per Week: 5 days    Minutes of Exercise per Session: 30 min  Stress: No Stress Concern Present (05/18/2021)   [employer] of Occupational Health - Occupational Stress Questionnaire    Feeling of Stress : Not at all  Social Connections: Moderately Integrated (05/18/2021)   Social Connection and Isolation Panel [NHANES]    Frequency of Communication with Friends and Family: More than three times a week    Frequency  of Social Gatherings with Friends and Family: Once a week    Attends Religious Services: Never    Marine scientist or Organizations: Yes    Attends Archivist Meetings: 1 to 4 times per year    Marital Status: Married  Human resources officer Violence: Not At Risk (05/18/2021)   Humiliation, Afraid, Rape, and Kick questionnaire    Fear of Current or Ex-Partner: No    Emotionally Abused: No    Physically Abused: No    Sexually Abused: No     Allergies  Allergen Reactions   Penicillins Other (See Comments) and Anaphylaxis    Taste buds     Outpatient Medications Prior to Visit  Medication Sig Dispense Refill   Pirfenidone 801 MG TABS Take 801 mg by mouth 3 (three) times daily with meals. 90 tablet 5   Acetaminophen (TYLENOL 8 HOUR PO) Take 1,000 mg by mouth 3 (three) times daily.     aspirin EC 81 MG tablet Take 81 mg by mouth daily.     atorvastatin (LIPITOR) 20 MG tablet Take 1 tablet (20 mg total) by mouth daily. TAKE 1 TABLET DAILY AT     6:00PM 90 tablet 1   carvedilol (COREG) 6.25 MG tablet TAKE 1 TABLET TWICE DAILY  WITH MEALS 180 tablet 0   cholecalciferol (VITAMIN D) 1000 UNITS tablet Take 1,000 Units by mouth daily.     HYDROcodone-acetaminophen (NORCO/VICODIN) 5-325 MG tablet Take 1  tablet by mouth every 6 (six) hours as needed for moderate pain.     metFORMIN (GLUCOPHAGE-XR) 500 MG 24 hr tablet TAKE 1 TABLET TWICE A DAY 180 tablet 3   Multiple Vitamin (MULTIVITAMIN WITH MINERALS) TABS tablet Take 1 tablet by mouth daily.     niacin (NIASPAN) 1000 MG CR tablet TAKE 1 TABLET AT BEDTIME 90 tablet 3   TURMERIC CURCUMIN PO Take 2,000 mg by mouth in the morning and at bedtime.     warfarin (JANTOVEN) 5 MG tablet TAKE AS DIRECTED BY THE    COUMADIN CLINIC (Patient taking differently: Take 2.5-5 mg by mouth See admin instructions. TAKE AS DIRECTED BY THE COUMADIN CLINIC; 5 mg Mon Wed and Fri, and 2.5 mg all other days) 75 tablet 3   No facility-administered medications prior to visit.   Review of Systems  Constitutional:  Positive for malaise/fatigue and weight loss. Negative for chills and fever.  HENT:  Negative for congestion, sinus pain and sore throat.   Eyes: Negative.   Respiratory:  Positive for shortness of breath. Negative for cough, hemoptysis, sputum production and wheezing.   Cardiovascular:  Negative for chest pain, palpitations, orthopnea, claudication and leg swelling.  Gastrointestinal:  Negative for abdominal pain, heartburn, nausea and vomiting.  Genitourinary: Negative.   Musculoskeletal:  Negative for joint pain and myalgias.  Skin:  Negative for rash.  Neurological:  Negative for weakness.  Endo/Heme/Allergies: Negative.   Psychiatric/Behavioral: Negative.     Objective:   Vitals:   05/15/22 0858  BP: 102/60  Pulse: 77  Temp: 98 F (36.7 C)  TempSrc: Oral  SpO2: 97%  Weight: 160 lb 6.4 oz (72.8 kg)  Height: 5\' 8"  (1.727 m)   Physical Exam Constitutional:      General: He is not in acute distress. HENT:     Head: Normocephalic and atraumatic.  Cardiovascular:     Rate and Rhythm: Normal rate and regular rhythm.    +Pacemaker    Pulses: Normal pulses.     Heart sounds:  Normal heart sounds. No murmur heard. Pulmonary:     Breath sounds:  Rales (bibasilar) present. No wheezing or rhonchi. +Crackles to mid-back Musculoskeletal:     Right lower leg: No edema.     Left lower leg: No edema.  Skin:    General: Skin is warm and dry.  Neurological:     General: No focal deficit present.     Mental Status: He is alert.  Psychiatric:        Mood and Affect: Mood normal.        Behavior: Behavior normal.        Thought Content: Thought content normal.        Judgment: Judgment normal.    CBC    Component Value Date/Time   WBC 6.5 04/03/2022 0925   RBC 3.39 (L) 04/03/2022 0925   HGB 12.1 (L) 04/03/2022 0925   HCT 35.6 (L) 04/03/2022 0925   PLT 208.0 04/03/2022 0925   MCV 105.2 (H) 04/03/2022 0925   MCH 32.5 06/16/2017 0546   MCHC 33.8 04/03/2022 0925   RDW 15.4 04/03/2022 0925   LYMPHSABS 1.1 04/03/2022 0925   MONOABS 0.5 04/03/2022 0925   EOSABS 0.3 04/03/2022 0925   BASOSABS 0.0 04/03/2022 0925      Latest Ref Rng & Units 04/13/2022    9:16 AM 04/03/2022    9:25 AM 03/10/2022    9:17 AM  BMP  Glucose 70 - 99 mg/dL 222  176  254   BUN 6 - 23 mg/dL 27  27  28    Creatinine 0.40 - 1.50 mg/dL 1.38  1.48  1.49   Sodium 135 - 145 mEq/L 139  140  141   Potassium 3.5 - 5.1 mEq/L 4.3  4.8  4.2   Chloride 96 - 112 mEq/L 106  105  107   CO2 19 - 32 mEq/L 26  28  26    Calcium 8.4 - 10.5 mg/dL 9.4  9.5  9.5    Chest imaging: HRCT Chest 10/19/21 1. The appearance of the lungs is compatible with interstitial lung disease, with a spectrum of findings considered diagnostic of usual interstitial pneumonia (UIP) per current ATS guidelines, as detailed above. 2. Aortic atherosclerosis, in addition to left main and three-vessel coronary artery disease. Status post median sternotomy for CABG including LIMA to the LAD. 3. There are calcifications of the aortic valve. Echocardiographic correlation for evaluation of potential valvular dysfunction may be warranted if clinically indicated. Small hiatal hernia.  CT Abd/Pelvis w/  contrast 05/06/21 Lower chest: Mild scarring at both lung bases. There is mild cardiomegaly status post median sternotomy. Pacemaker leads are seen within the right atrium and right ventricle. No significant pleural or pericardial effusion. There is a small hiatal hernia.  PFT:    Latest Ref Rng & Units 11/24/2021   11:01 AM  PFT Results  FVC-Pre L 2.81   FVC-Predicted Pre % 80   FVC-Post L 2.85   FVC-Predicted Post % 82   Pre FEV1/FVC % % 84   Post FEV1/FCV % % 86   FEV1-Pre L 2.36   FEV1-Predicted Pre % 98   FEV1-Post L 2.44   DLCO uncorrected ml/min/mmHg 15.24   DLCO UNC% % 68   DLCO corrected ml/min/mmHg 15.24   DLCO COR %Predicted % 68   DLVA Predicted % 84   TLC L 4.95   TLC % Predicted % 74   RV % Predicted % 77     Labs:  Path:  Echo 11/08/21  showed normal LV EF 55-60% and grade I diastolic dysfunction. RV systolic function is mildly reduced, with normal size and PASP.  Heart Catheterization:  Assessment & Plan:   Pulmonary fibrosis (HCC)  Discussion: [[Andrew Ryun Rodenbaugh is an 88 year old man, never smoker with DM II, CAD, complete heart block s/p pacemaker, CKDIII, chronic DVT and hypertension who returns to pulmonary clinic for pulmonary fibrosis.    He has pulmonary fibrosis as noted by his HRCT Chest. He has mild restrictive defect and mild diffusion defect based on pulmonary function tests.   He started pirfenidone in early March and has tolerated 3 tablets (801mg ) three times daily but has lost 20lbs since starting this medication. He has side effect of anorexia.  We will reach out to our pharmacy team about switching him to nintedanib and monitoring if he tolerates this medication better.   He does not require supplemental oxygen at this time based on simple walk at last visit and ONO.] ]]  Plan 08/15/22- Suhaas Agena For now he is going to stop Ofev. Hopefully appetite will return and weight loss will reverse. Need to watch for additional  explanations. I considered CXR but not available this morning and no change in symptoms. Recent CMET looks ok, but will check CBC w diff.  Consider megace, prednisone or etc. as appetite stimulant. He is advised to begin planning now for ways to meet his home heating/ firewood needs this winter if he is not able to cut his own. He is to follow-up in 3 months with Andrew [name]. Call earlier as needed.    CD Annamaria Boots, MD [facility] Office: [phone]

## 2022-08-15 NOTE — Patient Instructions (Signed)
Order- lab   CBC w diff   dx Pulmonary Fibrosis  Stop Ofev for now. Let's see if strength, weight and appetite return by the time you see Dr Erin Fulling again.  Please call sooner if we can help.Marland Kitchen

## 2022-08-30 ENCOUNTER — Ambulatory Visit: Payer: Medicare Other | Attending: Internal Medicine

## 2022-08-30 DIAGNOSIS — Z5181 Encounter for therapeutic drug level monitoring: Secondary | ICD-10-CM

## 2022-08-30 DIAGNOSIS — I82409 Acute embolism and thrombosis of unspecified deep veins of unspecified lower extremity: Secondary | ICD-10-CM | POA: Diagnosis not present

## 2022-08-30 LAB — POCT INR: INR: 2.3 (ref 2.0–3.0)

## 2022-08-30 NOTE — Patient Instructions (Signed)
-   Continue 1/2 tablet (2.5 mg) daily except 1 tablet (5 mg) on MONDAYS, WEDNESDAYS and FRIDAYS. - Recheck INR in 5 weeks Keep your green leafy vegetables- pick a day (or days)  to have your greens and have them every week

## 2022-09-03 NOTE — Progress Notes (Unsigned)
Cardiology Office Note  Date:  09/04/2022   ID:  Andrew [name], DOB 1934/08/20, MRN [id number]  PCP:  Pincus Sanes, MD   Chief Complaint  Patient presents with   12 month follow up     "Doing well." Medications reviewed by the patient verbally.     HPI:  Andrew [name] is a very pleasant 88 year old gentleman with history of  coronary artery disease,  bypass in May 2010, Chronic hip and back pain, can't walk hx of near syncope hyperlipidemia,  diabetes,  hypertension,  complete heart block 06/15/2017, pacer Prior DVT , on long-term anticoagulation, EF 55% no do it who presents for follow-up of his coronary artery disease, on warfarin for DVT, pacer interstitial lung Disease  Last seen by myself in clinic July 2022 Seen by EP October 2022  Does yard work, active  Losing [employer] (for his underlying lung disease) was held, feels like weight has stabilized  CT scan chest: December 2022 interstitial lung disease  Pacer downloads reviewed, followed by EP  Lab work reviewed A1C 6.7 Total chol 135, LDL 5 CR 1.3, BUN 21 HGB 11.4  EKG personally reviewed by myself on todays visit Normal sinus rhythm rate 68 bpm atrial sensed V paced  Other past medical history reviewed hospitalization for complete heart block 06/15/2017 Bradycardic heart rate in the 20 to 30s St. Jude pacemaker placed June 11 2017, by Dr. Ladona Ridgel  Stress test: 12/27/2016: no ischemia Results reviewed with him in detail  Event monitor 12/21/2016:  Results reviewed with him in detail Normal sinus rhythm Min HR of 43 bpm, max HR of 117 bpm, and avg HR of 72 bpm.  Rare APCs and PVCs likely causing symptoms No other significnat arrhythmia  History of a pinched nerve, back surgery 06/19/2013. He continues to have back pain, status post radiofrequency treatments x3 through [facility].   PMH:   has a past medical history of Colonic polyp (2011 last colo), Coronary artery disease  (02/2009), DEGENERATIVE JOINT DISEASE, Diverticulosis of colon, DIVERTICULOSIS, COLON, History of kidney stones, HYPERLIPIDEMIA-MIXED, HYPERTENSION, BENIGN, LBP (low back pain), Pacemaker, Symptomatic bradycardia, Type II or unspecified type diabetes mellitus without mention of complication, not stated as uncontrolled, and VITAMIN D DEFICIENCY.  PSH:    Past Surgical History:  Procedure Laterality Date   CARPAL TUNNEL RELEASE Right    CATARACT EXTRACTION, BILATERAL     R 07/07/13, L 07/21/13   CORONARY ARTERY BYPASS GRAFT  03/17/09   x 4   CYSTOSCOPY WITH STENT PLACEMENT Left 05/10/2021   Procedure: CYSTOSCOPY WITH LEFT  STENT PLACEMENT,LEFT RETROGRADE PYELOGRAM;  Surgeon: Crist Fat, MD;  Location: [facility];  Service: Urology;  Laterality: Left;   CYSTOSCOPY/URETEROSCOPY/HOLMIUM LASER/STENT PLACEMENT Left 06/03/2021   Procedure: CYSTOSCOPY, LEFT RETROGRADE, URETEROSCOPY,HOLMIUM LASER,STENT EXCHANGE;  Surgeon: Crist Fat, MD;  Location: [facility];  Service: Urology;  Laterality: Left;   INGUINAL HERNIA REPAIR  1980   KNEE ARTHROSCOPY  2007   right   LUMBAR EPIDURAL INJECTION  2013   Has as needed.   LUMBAR LAMINECTOMY/DECOMPRESSION MICRODISCECTOMY N/A 06/19/2013   Procedure: LUMBAR LAMINECTOMY/DECOMPRESSION MICRODISCECTOMY;  Surgeon: Emilee Hero, MD;  Location[facility];  Service: Orthopedics;  Laterality: N/A;  Lumbar 4-5 decompression   PACEMAKER IMPLANT N/A 06/16/2017   Procedure: Pacemaker Implant;  Surgeon: Marinus Maw, MD;  Location: [facility] INVASIVE CV LAB;  Service: Cardiovascular;  Laterality: N/A;   TRIGGER FINGER RELEASE Right     Current Outpatient Medications  Medication  Sig Dispense Refill   Acetaminophen (TYLENOL 8 HOUR PO) Take 1,000 mg by mouth 3 (three) times daily.     aspirin EC 81 MG tablet Take 81 mg by mouth daily.     atorvastatin (LIPITOR) 20 MG tablet TAKE 1 TABLET DAILY AT     6:00PM 90 tablet 1   carvedilol (COREG) 6.25 MG tablet TAKE 1 TABLET  TWICE DAILY  WITH MEALS 180 tablet 0   cholecalciferol (VITAMIN D) 1000 UNITS tablet Take 1,000 Units by mouth daily.     metFORMIN (GLUCOPHAGE-XR) 500 MG 24 hr tablet TAKE 1 TABLET TWICE A DAY 180 tablet 3   Multiple Vitamin (MULTIVITAMIN WITH MINERALS) TABS tablet Take 1 tablet by mouth daily.     niacin (NIASPAN) 1000 MG CR tablet TAKE 1 TABLET AT BEDTIME 90 tablet 3   Nintedanib (OFEV) 150 MG CAPS Take 1 capsule (150 mg total) by mouth 2 (two) times daily. 180 capsule 1   TURMERIC CURCUMIN PO Take 2,000 mg by mouth in the morning and at bedtime.     warfarin (JANTOVEN) 5 MG tablet TAKE 1-2 TABLETS DAILY OR AS DIRECTED BY THE COUMADIN CLINIC 75 tablet 3   HYDROcodone-acetaminophen (NORCO/VICODIN) 5-325 MG tablet Take 1 tablet by mouth every 6 (six) hours as needed for moderate pain. (Patient not taking: Reported on 09/04/2022)     No current facility-administered medications for this visit.     Allergies:   Penicillins   Social History:  The patient  reports that he has never smoked. He has never used smokeless tobacco. He reports current alcohol use. He reports that he does not use drugs.   Family History:   family history includes Diabetes in an other family member; Heart failure in his father and mother.    Review of Systems: Review of Systems  Constitutional:  Positive for weight loss.  HENT: Negative.    Respiratory: Negative.    Cardiovascular: Negative.   Gastrointestinal: Negative.   Musculoskeletal:  Positive for back pain and joint pain.  Neurological: Negative.   Psychiatric/Behavioral: Negative.    All other systems reviewed and are negative.   PHYSICAL EXAM: VS:  BP (!) 110/50 (BP Location: Left Arm, Patient Position: Sitting, Cuff Size: Normal)   Pulse 68   Ht 5\' 8"  (1.727 m)   Wt 162 lb 8 oz (73.7 kg)   SpO2 98%   BMI 24.71 kg/m  , BMI Body mass index is 24.71 kg/m. Constitutional:  oriented to person, place, and time. No distress.  HENT:  Head: Grossly  normal Eyes:  no discharge. No scleral icterus.  Neck: No JVD, no carotid bruits  Cardiovascular: Regular rate and rhythm, no murmurs appreciated Pulmonary/Chest: Clear to auscultation bilaterally, no wheezes or rails Abdominal: Soft.  no distension.  no tenderness.  Musculoskeletal: Normal range of motion Neurological:  normal muscle tone. Coordination normal. No atrophy Skin: Skin warm and dry Psychiatric: normal affect, pleasant  Recent Labs: 07/18/2022: ALT 19; BUN 21; Creatinine, Ser 1.30; Potassium 4.8; Sodium 140 08/15/2022: Hemoglobin 11.4; Platelets 186.0    Lipid Panel Lab Results  Component Value Date   CHOL 135 04/03/2022   HDL 52.50 04/03/2022   LDLCALC 55 04/03/2022   TRIG 138.0 04/03/2022      Wt Readings from Last 3 Encounters:  09/04/22 162 lb 8 oz (73.7 kg)  08/15/22 158 lb 6.4 oz (71.8 kg)  05/29/22 163 lb (73.9 kg)     ASSESSMENT AND PLAN:  Pure hypercholesterolemia -  Cholesterol  is at goal on the current lipid regimen. No changes to the medications were made.  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 No further workup at this time. Continue current medication regimen.  Type 2 diabetes mellitus with neurological manifestations, controlled (HCC) A1c well controlled  Complete heart block,pacemaker Seen by Dr. Lalla Brothers annual follow-up  Arthritis Tylenol   Total encounter time more than 30 minutes  Greater than 50% was spent in counseling and coordination of care with the patient   Signed, Dossie Arbour, M.D., Ph.D. 09/04/2022  [facility] [place],  [place] [phone]

## 2022-09-04 ENCOUNTER — Encounter: Payer: Self-pay | Admitting: Cardiovascular Disease

## 2022-09-04 ENCOUNTER — Ambulatory Visit: Payer: Medicare Other | Attending: Cardiovascular Disease | Admitting: Cardiovascular Disease

## 2022-09-04 VITALS — BP 110/50 | HR 68 | Ht 68.0 in | Wt 162.5 lb

## 2022-09-04 DIAGNOSIS — R0602 Shortness of breath: Secondary | ICD-10-CM

## 2022-09-04 DIAGNOSIS — I442 Atrioventricular block, complete: Secondary | ICD-10-CM | POA: Diagnosis not present

## 2022-09-04 DIAGNOSIS — I82409 Acute embolism and thrombosis of unspecified deep veins of unspecified lower extremity: Secondary | ICD-10-CM

## 2022-09-04 DIAGNOSIS — I10 Essential (primary) hypertension: Secondary | ICD-10-CM

## 2022-09-04 DIAGNOSIS — E1149 Type 2 diabetes mellitus with other diabetic neurological complication: Secondary | ICD-10-CM

## 2022-09-04 DIAGNOSIS — Z950 Presence of cardiac pacemaker: Secondary | ICD-10-CM | POA: Diagnosis not present

## 2022-09-04 DIAGNOSIS — I25708 Atherosclerosis of coronary artery bypass graft(s), unspecified, with other forms of angina pectoris: Secondary | ICD-10-CM

## 2022-09-04 DIAGNOSIS — E782 Mixed hyperlipidemia: Secondary | ICD-10-CM

## 2022-09-04 MED ORDER — CARVEDILOL 6.25 MG PO TABS: 6.2500 mg | ORAL_TABLET | Freq: Two times a day (BID) | ORAL | 3 refills | Status: DC

## 2022-09-04 NOTE — Patient Instructions (Addendum)
Medication Instructions:  No changes  If you need a refill on your cardiac medications before your next appointment, please call your pharmacy.   Lab work: No new labs needed  Testing/Procedures: No new testing needed  Follow-Up: At [facility], you and your health needs are our priority.  As part of our continuing mission to provide you with exceptional heart care, we have created designated Provider Care Teams.  These Care Teams include your primary Cardiologist (physician) and Advanced Practice Providers (APPs -  Physician Assistants and Nurse Practitioners) who all work together to provide you with the care you need, when you need it.  You will need a follow up appointment in 12 months  Providers on your designated Care Team:   Christopher Berge, NP Ryan Dunn, PA-C Cadence Furth, PA-C  COVID-19 Vaccine Information can be found at: [email or web address] For questions related to vaccine distribution or appointments, please email [email or web address] or call [phone].   

## 2022-09-13 ENCOUNTER — Ambulatory Visit (INDEPENDENT_AMBULATORY_CARE_PROVIDER_SITE_OTHER): Payer: Medicare Other

## 2022-09-13 DIAGNOSIS — I442 Atrioventricular block, complete: Secondary | ICD-10-CM

## 2022-09-13 LAB — CUP PACEART REMOTE DEVICE CHECK
Battery Remaining Longevity: 66 mo
Battery Remaining Percentage: 53 %
Battery Voltage: 2.99 V
Brady Statistic AP VP Percent: 9 %
Brady Statistic AP VS Percent: 1 %
Brady Statistic AS VP Percent: 91 %
Brady Statistic AS VS Percent: 1 %
Brady Statistic RA Percent Paced: 8.7 %
Brady Statistic RV Percent Paced: 99 %
Date Time Interrogation Session: 20231122052524
Implantable Lead Connection Status: 753985
Implantable Lead Connection Status: 753985
Implantable Lead Implant Date: 20180825
Implantable Lead Implant Date: 20180825
Implantable Lead Location: 753859
Implantable Lead Location: 753860
Implantable Pulse Generator Implant Date: 20180825
Lead Channel Impedance Value: 430 Ohm
Lead Channel Impedance Value: 560 Ohm
Lead Channel Pacing Threshold Amplitude: 0.5 V
Lead Channel Pacing Threshold Amplitude: 0.625 V
Lead Channel Pacing Threshold Pulse Width: 0.5 ms
Lead Channel Pacing Threshold Pulse Width: 0.5 ms
Lead Channel Sensing Intrinsic Amplitude: 10.2 mV
Lead Channel Sensing Intrinsic Amplitude: 3.6 mV
Lead Channel Setting Pacing Amplitude: 0.875
Lead Channel Setting Pacing Amplitude: 2 V
Lead Channel Setting Pacing Pulse Width: 0.5 ms
Lead Channel Setting Sensing Sensitivity: 4 mV
Pulse Gen Model: 2272
Pulse Gen Serial Number: 8939923

## 2022-09-20 ENCOUNTER — Telehealth: Payer: Self-pay | Admitting: Internal Medicine

## 2022-09-20 LAB — HM DIABETES EYE EXAM

## 2022-09-20 NOTE — Telephone Encounter (Signed)
LVM for pt to rtn my call to schedule AWV with [facility] call back # [phone] 

## 2022-09-28 ENCOUNTER — Encounter: Payer: Self-pay | Admitting: Internal Medicine

## 2022-09-28 NOTE — Progress Notes (Signed)
Outside notes received. Information abstracted. Notes sent to scan.  

## 2022-09-28 NOTE — Progress Notes (Signed)
Subjective:   Andrew [name] is a 88 y.o. male who presents for Medicare Annual/Subsequent preventive examination. I connected with  Andrew [name] on 09/29/22 by a audio enabled telemedicine application and verified that I am speaking with the correct person using two identifiers.  Patient Location: Home  Provider Location: Home Office  I discussed the limitations of evaluation and management by telemedicine. The patient expressed understanding and agreed to proceed.  Review of Systems    Deferred to PCP Cardiac Risk Factors include: advanced age (>10men, >74 women);diabetes mellitus;dyslipidemia;male gender;hypertension     Objective:    Today's Vitals   09/29/22 0834  PainSc: 3    There is no height or weight on file to calculate BMI.     09/29/2022    9:01 AM 06/03/2021    6:46 AM 05/19/2021    8:11 AM 05/18/2021    9:09 AM 09/27/2017   10:14 AM 06/15/2017    2:46 PM 09/26/2016    8:17 AM  Advanced Directives  Does Patient Have a Medical Advance Directive? Yes Yes Yes Yes Yes Yes Yes  Type of Estate agent of [place];Living will Living will Living will Living will Healthcare Power of [place];Living will Living will;Healthcare Power of [employer] Power of [place];Living will  Does patient want to make changes to medical advance directive? No - Patient declined No - Patient declined  No - Patient declined  No - Patient declined No - Patient declined  Copy of Healthcare Power of Attorney in Chart? Yes - validated most recent copy scanned in chart (See row information)    No - copy requested No - copy requested No - copy requested    Current Medications (verified) Outpatient Encounter Medications as of 09/29/2022  Medication Sig   Acetaminophen (TYLENOL 8 HOUR PO) Take 1,000 mg by mouth 3 (three) times daily.   aspirin EC 81 MG tablet Take 81 mg by mouth daily.   atorvastatin (LIPITOR) 20 MG tablet TAKE 1 TABLET DAILY AT     6:00PM    carvedilol (COREG) 6.25 MG tablet Take 1 tablet (6.25 mg total) by mouth 2 (two) times daily with a meal.   cholecalciferol (VITAMIN D) 1000 UNITS tablet Take 1,000 Units by mouth daily.   HYDROcodone-acetaminophen (NORCO/VICODIN) 5-325 MG tablet Take 1 tablet by mouth every 6 (six) hours as needed for moderate pain.   metFORMIN (GLUCOPHAGE-XR) 500 MG 24 hr tablet TAKE 1 TABLET TWICE A DAY   Multiple Vitamin (MULTIVITAMIN WITH MINERALS) TABS tablet Take 1 tablet by mouth daily.   niacin (NIASPAN) 1000 MG CR tablet TAKE 1 TABLET AT BEDTIME   TURMERIC CURCUMIN PO Take 2,000 mg by mouth in the morning and at bedtime.   warfarin (JANTOVEN) 5 MG tablet TAKE 1-2 TABLETS DAILY OR AS DIRECTED BY THE COUMADIN CLINIC   Nintedanib (OFEV) 150 MG CAPS Take 1 capsule (150 mg total) by mouth 2 (two) times daily. (Patient not taking: Reported on 09/29/2022)   No facility-administered encounter medications on file as of 09/29/2022.    Allergies (verified) Penicillins   History: Past Medical History:  Diagnosis Date   Colonic polyp 2011 last colo   colo q 58yr - [facility]    Coronary artery disease 02/2009   severe left main and three-vessel, CABG   DEGENERATIVE JOINT DISEASE    Diverticulosis of colon    DIVERTICULOSIS, COLON    History of kidney stones    HYPERLIPIDEMIA-MIXED    HYPERTENSION, BENIGN  LBP (low back pain)    s/p decompression 8/14; ESI - [place]; RFA x 3 early 2015   Pacemaker    a. 05/2017: St. Jude (serial number [id number]) pacemaker   Symptomatic bradycardia    a. s/p St. Jude (serial number [id number]) pacemaker 06/16/17   Type II or unspecified type diabetes mellitus without mention of complication, not stated as uncontrolled    VITAMIN D DEFICIENCY    Past Surgical History:  Procedure Laterality Date   CARPAL TUNNEL RELEASE Right    CATARACT EXTRACTION, BILATERAL     R 07/07/13, L 07/21/13   CORONARY ARTERY BYPASS GRAFT  03/17/09   x 4   CYSTOSCOPY WITH STENT PLACEMENT Left  05/10/2021   Procedure: CYSTOSCOPY WITH LEFT  STENT PLACEMENT,LEFT RETROGRADE PYELOGRAM;  Surgeon: Crist Fat, MD;  Location: [facility];  Service: Urology;  Laterality: Left;   CYSTOSCOPY/URETEROSCOPY/HOLMIUM LASER/STENT PLACEMENT Left 06/03/2021   Procedure: CYSTOSCOPY, LEFT RETROGRADE, URETEROSCOPY,HOLMIUM LASER,STENT EXCHANGE;  Surgeon: Crist Fat, MD;  Location: [facility];  Service: Urology;  Laterality: Left;   INGUINAL HERNIA REPAIR  1980   KNEE ARTHROSCOPY  2007   right   LUMBAR EPIDURAL INJECTION  2013   Has as needed.   LUMBAR LAMINECTOMY/DECOMPRESSION MICRODISCECTOMY N/A 06/19/2013   Procedure: LUMBAR LAMINECTOMY/DECOMPRESSION MICRODISCECTOMY;  Surgeon: Emilee Hero, MD;  Location: [facility] OR;  Service: Orthopedics;  Laterality: N/A;  Lumbar 4-5 decompression   PACEMAKER IMPLANT N/A 06/16/2017   Procedure: Pacemaker Implant;  Surgeon: Marinus Maw, MD;  Location: [facility] INVASIVE CV LAB;  Service: Cardiovascular;  Laterality: N/A;   TRIGGER FINGER RELEASE Right    Family History  Problem Relation Age of Onset   Heart failure Mother        died age 72   Heart failure Father        died age 20   Diabetes Other    Heart disease Neg Hx    Social History   Socioeconomic History   Marital status: Married    Spouse name: Clydie Braun   Number of children: Not on file   Years of education: Not on file   Highest education level: Not on file  Occupational History   Not on file  Tobacco Use   Smoking status: Never   Smokeless tobacco: Never   Tobacco comments:    Married, 3 grown children. 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 used  Substance and Sexual Activity   Alcohol use: Yes    Comment: RARE   Drug use: No   Sexual activity: Not Currently  Other Topics Concern   Not on file  Social History Narrative   Not on file   Social Determinants of Health   Financial Resource Strain: Low Risk  (09/29/2022)   Overall  Financial Resource Strain (CARDIA)    Difficulty of Paying Living Expenses: Not hard at all  Food Insecurity: No Food Insecurity (09/29/2022)   Hunger Vital Sign    Worried About Running Out of Food in the Last Year: Never true    Ran Out of Food in the Last Year: Never true  Transportation Needs: No Transportation Needs (09/29/2022)   PRAPARE - Administrator, Civil Service (Medical): No    Lack of Transportation (Non-Medical): No  Physical Activity: Insufficiently Active (09/29/2022)   Exercise Vital Sign    Days of Exercise per Week: 3 days    Minutes of Exercise per Session: 20 min  Stress: No Stress Concern Present (09/29/2022)   [employer] - Occupational Stress Questionnaire    Feeling of Stress : Not at all  Social Connections: Socially Integrated (09/29/2022)   Social Connection and Isolation Panel [NHANES]    Frequency of Communication with Friends and Family: More than three times a week    Frequency of Social Gatherings with Friends and Family: Three times a week    Attends Religious Services: More than 4 times per year    Active Member of Clubs or Organizations: Yes    Attends Engineer, structuralClub or Organization Meetings: More than 4 times per year    Marital Status: Married    Tobacco Counseling Counseling given: Not Answered Tobacco comments: Married, 3 grown children. Retired 04/2002 from [employer] work   Clinical Intake:  Pre-visit preparation completed: Yes  Pain : 0-10 Pain Score: 3  Pain Type: Chronic pain Pain Location: Back Pain Descriptors / Indicators: Discomfort, Aching, Dull Pain Relieving Factors: medication Effect of Pain on Daily Activities: decreases physical activity  Pain Relieving Factors: medication  Nutritional Status: BMI of 19-24  Normal Nutritional Risks: Other (Comment) (Patient states OFEV medication caused him to lose his appetite. The medication is now on hold and he reports eating more and  re-gaining weight. Discussed drinking supplements) Diabetes: Yes CBG done?: No Did pt. bring in CBG monitor from home?: No  How often do you need to have someone help you when you read instructions, pamphlets, or other written materials from your doctor or pharmacy?: 1 - Never  Diabetic?Yes Nutrition Risk Assessment:  Has the patient had any N/V/D within the last 2 months?  No  Does the patient have any non-healing wounds?  No  Has the patient had any unintentional weight loss or weight gain?  No   Diabetes:  Is the patient diabetic?  Yes  If diabetic, was a CBG obtained today?  No , phone visit Did the patient bring in their glucometer from home?  No , phone visit How often do you monitor your CBG's? Patient states he does not take his blood sugar.   Financial Strains and Diabetes Management:  Are you having any financial strains with the device, your supplies or your medication? No .  Does the patient want to be seen by Chronic Care Management for management of their diabetes?  No  Would the patient like to be referred to a Nutritionist or for Diabetic Management?  No   Diabetic Exams:  Diabetic Eye Exam: Completed 09/20/22 Diabetic Foot Exam: Overdue, Pt has been advised about the importance in completing this exam. Pt is scheduled for diabetic foot exam on deferred to PCP.   Interpreter Needed?: No  Information entered by :: Blanchie ServeJill Bethenny Losee RN   Activities of Daily Living    09/29/2022    8:41 AM 05/29/2022    2:16 PM  In your present state of health, do you have any difficulty performing the following activities:  Hearing? 0 1  Comment  has hearing aids but doesn't like to wear them, looking for a new pair that he will like  Vision? 0 0  Difficulty concentrating or making decisions? 0 0  Walking or climbing stairs? 0 0  Dressing or bathing? 0 0  Doing errands, shopping? 0 0  Preparing Food and eating ? N   Using the Toilet? N   In the past six months, have you  accidently leaked urine? N   Do you have problems with loss  of bowel control? N   Managing your Medications? N   Managing your Finances? N   Housekeeping or managing your Housekeeping? N     Patient Care Team: Pincus Sanes, MD as PCP - General (Internal Medicine) Lunette Stands, MD as Consulting Physician (Orthopedic Surgery) Loreta Ave, MD (Inactive) as Consulting Physician (Orthopedic Surgery) Antonieta Iba, MD as Consulting Physician (Cardiology) Estill Bamberg, MD (Orthopedic Surgery) Hanley Seamen Dustin Folks, MD (Optometry) Carman Ching, MD (Inactive) (Gastroenterology) Donzetta Starch, MD as Consulting Physician (Dermatology)  Indicate any recent Medical Services you may have received from other than [facility] providers in the past year (date may be approximate).     Assessment:   This is a routine wellness examination for [place].  Hearing/Vision screen No results found.  Dietary issues and exercise activities discussed: Current Exercise Habits: The patient does not participate in regular exercise at present, Exercise limited by: orthopedic condition(s)   Goals Addressed             This Visit's Progress    Patient Stated       Maintain current health status.      Depression Screen    09/29/2022    8:39 AM 05/29/2022    2:13 PM 04/03/2022    8:44 AM 12/30/2021   11:34 AM 05/18/2021    9:25 AM 04/01/2021    8:06 AM 10/01/2019    7:59 AM  PHQ 2/9 Scores  PHQ - 2 Score 0 2 0 0 1 0 0  PHQ- 9 Score  2 4        Fall Risk    09/29/2022    8:42 AM 05/29/2022    2:16 PM 12/30/2021   11:34 AM 05/18/2021    9:11 AM 04/01/2021    8:06 AM  Fall Risk   Falls in the past year? 0 0 0 0 0  Number falls in past yr: 0 0 0 0 0  Injury with Fall? 0 0 0 0 0  Risk for fall due to : History of fall(s)  No Fall Risks No Fall Risks No Fall Risks  Follow up   Falls evaluation completed Falls evaluation completed Falls evaluation completed    FALL RISK PREVENTION PERTAINING TO THE  HOME:  Any stairs in or around the home? Yes  If so, are there any without handrails? Yes  Home free of loose throw rugs in walkways, pet beds, electrical cords, etc? Yes  Adequate lighting in your home to reduce risk of falls? Yes   ASSISTIVE DEVICES UTILIZED TO PREVENT FALLS:  Life alert? No  Use of a cane, walker or w/c? No  Grab bars in the bathroom? Yes  Shower chair or bench in shower? No  Elevated toilet seat or a handicapped toilet? No   Cognitive Function:    09/27/2017   10:43 AM  MMSE - Mini Mental State Exam  Orientation to time 5  Orientation to Place 5  Registration 3  Attention/ Calculation 5  Recall 2  Language- name 2 objects 2  Language- repeat 1  Language- follow 3 step command 3  Language- read & follow direction 1  Write a sentence 1  Copy design 1  Total score 29        09/29/2022    8:42 AM 05/29/2022    2:15 PM  6CIT Screen  What Year? 0 points 0 points  What month? 0 points 0 points  What time? 0 points 0 points  Count back from  20 0 points 0 points  Months in reverse 0 points 0 points  Repeat phrase 0 points 0 points  Total Score 0 points 0 points    Immunizations Immunization History  Administered Date(s) Administered   Influenza Split 09/26/2012   Influenza, High Dose Seasonal PF 07/30/2018, 07/02/2019, 09/06/2020   Influenza, Seasonal, Injecte, Preservative Fre 07/23/2013   Influenza-Unspecified 09/24/2014, 07/24/2015, 08/04/2016, 08/13/2017, 08/09/2021, 08/11/2022   Moderna Covid-19 Vaccine Bivalent Booster 78yrs & up 09/11/2021   PFIZER(Purple Top)SARS-COV-2 Vaccination 12/01/2019, 12/24/2019, 09/06/2020   PNEUMOCOCCAL CONJUGATE-20 10/03/2021   Pneumococcal Conjugate-13 09/22/2015   Pneumococcal-Unspecified 09/25/2011   Td 11/07/2002, 03/26/2013    TDAP status: Up to date  Flu Vaccine status: Up to date  Pneumococcal vaccine status: Up to date  Covid-19 vaccine status: Information provided on how to obtain vaccines.    Qualifies for Shingles Vaccine? Yes   Zostavax completed No   Shingrix Completed?: No.    Education has been provided regarding the importance of this vaccine. Patient has been advised to call insurance company to determine out of pocket expense if they have not yet received this vaccine. Advised may also receive vaccine at local pharmacy or Health Dept. Verbalized acceptance and understanding.  Screening Tests Health Maintenance  Topic Date Due   Zoster Vaccines- Shingrix (1 of 2) Never done   FOOT EXAM  07/06/2019   COVID-19 Vaccine (5 - 2023-24 season) 06/23/2022   HEMOGLOBIN A1C  10/03/2022   DTaP/Tdap/Td (3 - Tdap) 03/27/2023   OPHTHALMOLOGY EXAM  09/21/2023   Medicare Annual Wellness (AWV)  09/30/2023   Pneumonia Vaccine 68+ Years old  Completed   INFLUENZA VACCINE  Completed   HPV VACCINES  Aged Out   COLONOSCOPY (Pts 45-59yrs Insurance coverage will need to be confirmed)  Discontinued    Health Maintenance  Health Maintenance Due  Topic Date Due   Zoster Vaccines- Shingrix (1 of 2) Never done   FOOT EXAM  07/06/2019   COVID-19 Vaccine (5 - 2023-24 season) 06/23/2022    Colorectal cancer screening: No longer required.   Lung Cancer Screening: (Low Dose CT Chest recommended if Age 32-80 years, 30 pack-year currently smoking OR have quit w/in 15years.) does not qualify.   Additional Screening:  Hepatitis C Screening: does qualify; Completed education provided  Vision Screening: Recommended annual ophthalmology exams for early detection of glaucoma and other disorders of the eye. Is the patient up to date with their annual eye exam?  Yes  Who is the provider or what is the name of the office in which the patient attends annual eye exams? [facility] If pt is not established with a provider, would they like to be referred to a provider to establish care?  N/A .   Dental Screening: Recommended annual dental exams for proper oral hygiene  Community Resource  Referral / Chronic Care Management: CRR required this visit?  No   CCM required this visit?  No      Plan:     I have personally reviewed and noted the following in the patient's chart:   Medical and social history Use of alcohol, tobacco or illicit drugs  Current medications and supplements including opioid prescriptions. Patient is not currently taking opioid prescriptions. Functional ability and status Nutritional status Physical activity Advanced directives List of other physicians Hospitalizations, surgeries, and ER visits in previous 12 months Vitals Screenings to include cognitive, depression, and falls Referrals and appointments  In addition, I have reviewed and discussed with patient certain preventive  protocols, quality metrics, and best practice recommendations. A written personalized care plan for preventive services as well as general preventive health recommendations were provided to patient.     Wanda Plump, RN   09/29/2022   Nurse Notes:  Mr. Andal , Thank you for taking time to come for your Medicare Wellness Visit. I appreciate your ongoing commitment to your health goals. Please review the following plan we discussed and let me know if I can assist you in the future.   These are the goals we discussed:  Goals       Patient Stated     patient states (pt-stated)      Maintain current health.       Other     Patient Stated      Maintain current health status. I want to continue to enjoy off shore fishing. Enjoy life and family.       Patient Stated      Maintain current health status.        This is a list of the screening recommended for you and due dates:  Health Maintenance  Topic Date Due   Zoster (Shingles) Vaccine (1 of 2) Never done   Complete foot exam   07/06/2019   COVID-19 Vaccine (5 - 2023-24 season) 06/23/2022   Hemoglobin A1C  10/03/2022   DTaP/Tdap/Td vaccine (3 - Tdap) 03/27/2023   Eye exam for diabetics  09/21/2023    Medicare Annual Wellness Visit  09/30/2023   Pneumonia Vaccine  Completed   Flu Shot  Completed   HPV Vaccine  Aged Out   Colon Cancer Screening  Discontinued

## 2022-09-28 NOTE — Patient Instructions (Signed)
Health Maintenance, Male Adopting a healthy lifestyle and getting preventive care are important in promoting health and wellness. Ask your health care provider about: The right schedule for you to have regular tests and exams. Things you can do on your own to prevent diseases and keep yourself healthy. What should I know about diet, weight, and exercise? Eat a healthy diet  Eat a diet that includes plenty of vegetables, fruits, low-fat dairy products, and lean protein. Do not eat a lot of foods that are high in solid fats, added sugars, or sodium. Maintain a healthy weight Body mass index (BMI) is a measurement that can be used to identify possible weight problems. It estimates body fat based on height and weight. Your health care provider can help determine your BMI and help you achieve or maintain a healthy weight. Get regular exercise Get regular exercise. This is one of the most important things you can do for your health. Most adults should: Exercise for at least 150 minutes each week. The exercise should increase your heart rate and make you sweat (moderate-intensity exercise). Do strengthening exercises at least twice a week. This is in addition to the moderate-intensity exercise. Spend less time sitting. Even light physical activity can be beneficial. Watch cholesterol and blood lipids Have your blood tested for lipids and cholesterol at 86 years of age, then have this test every 5 years. You may need to have your cholesterol levels checked more often if: Your lipid or cholesterol levels are high. You are older than 86 years of age. You are at high risk for heart disease. What should I know about cancer screening? Many types of cancers can be detected early and may often be prevented. Depending on your health history and family history, you may need to have cancer screening at various ages. This may include screening for: Colorectal cancer. Prostate cancer. Skin cancer. Lung  cancer. What should I know about heart disease, diabetes, and high blood pressure? Blood pressure and heart disease High blood pressure causes heart disease and increases the risk of stroke. This is more likely to develop in people who have high blood pressure readings or are overweight. Talk with your health care provider about your target blood pressure readings. Have your blood pressure checked: Every 3-5 years if you are 18-39 years of age. Every year if you are 40 years old or older. If you are between the ages of 65 and 75 and are a current or former smoker, ask your health care provider if you should have a one-time screening for abdominal aortic aneurysm (AAA). Diabetes Have regular diabetes screenings. This checks your fasting blood sugar level. Have the screening done: Once every three years after age 45 if you are at a normal weight and have a low risk for diabetes. More often and at a younger age if you are overweight or have a high risk for diabetes. What should I know about preventing infection? Hepatitis B If you have a higher risk for hepatitis B, you should be screened for this virus. Talk with your health care provider to find out if you are at risk for hepatitis B infection. Hepatitis C Blood testing is recommended for: Everyone born from 1945 through 1965. Anyone with known risk factors for hepatitis C. Sexually transmitted infections (STIs) You should be screened each year for STIs, including gonorrhea and chlamydia, if: You are sexually active and are younger than 86 years of age. You are older than 86 years of age and your   health care provider tells you that you are at risk for this type of infection. Your sexual activity has changed since you were last screened, and you are at increased risk for chlamydia or gonorrhea. Ask your health care provider if you are at risk. Ask your health care provider about whether you are at high risk for HIV. Your health care provider  may recommend a prescription medicine to help prevent HIV infection. If you choose to take medicine to prevent HIV, you should first get tested for HIV. You should then be tested every 3 months for as long as you are taking the medicine. Follow these instructions at home: Alcohol use Do not drink alcohol if your health care provider tells you not to drink. If you drink alcohol: Limit how much you have to 0-2 drinks a day. Know how much alcohol is in your drink. In the [place] one drink equals one 12 oz bottle of beer (355 mL), one 5 oz glass of Kashari Chalmers (148 mL), or one 1 oz glass of hard liquor (44 mL). Lifestyle Do not use any products that contain nicotine or tobacco. These products include cigarettes, chewing tobacco, and vaping devices, such as e-cigarettes. If you need help quitting, ask your health care provider. Do not use street drugs. Do not share needles. Ask your health care provider for help if you need support or information about quitting drugs. General instructions Schedule regular health, dental, and eye exams. Stay current with your vaccines. Tell your health care provider if: You often feel depressed. You have ever been abused or do not feel safe at home. Summary Adopting a healthy lifestyle and getting preventive care are important in promoting health and wellness. Follow your health care provider's instructions about healthy diet, exercising, and getting tested or screened for diseases. Follow your health care provider's instructions on monitoring your cholesterol and blood pressure. This information is not intended to replace advice given to you by your health care provider. Make sure you discuss any questions you have with your health care provider. Document Revised: 02/28/2021 Document Reviewed: 02/28/2021 Elsevier Patient Education  2023 Elsevier Inc.  

## 2022-09-29 ENCOUNTER — Ambulatory Visit (INDEPENDENT_AMBULATORY_CARE_PROVIDER_SITE_OTHER): Payer: Medicare Other | Admitting: *Deleted

## 2022-09-29 DIAGNOSIS — Z0000 Encounter for general adult medical examination without abnormal findings: Secondary | ICD-10-CM | POA: Diagnosis not present

## 2022-10-01 NOTE — Patient Instructions (Addendum)
      Blood work was ordered.   The lab is on the first floor.    Medications changes include : none       Return in about 6 months (around 04/03/2023) for follow up.

## 2022-10-01 NOTE — Progress Notes (Unsigned)
Subjective:    [name], male    DOB: 05-23-1934, 88 y.o.   MRN: [id number]     HPI Andrew [name] is here for follow up of his chronic medical problems, including DM, CAD, hld, OA, CKD  Had a nerve ablation and his back pain is better.  Still taking pain pills.  He typically takes 2 a day.  OFEV on hold due to weight loss.  Muscle weakness in legs.  Has difficulty getting up from chair.     Medications and allergies reviewed with patient and updated if appropriate.  Current Outpatient Medications on File Prior to Visit  Medication Sig Dispense Refill   Acetaminophen (TYLENOL 8 HOUR PO) Take 1,000 mg by mouth 3 (three) times daily.     aspirin EC 81 MG tablet Take 81 mg by mouth daily.     atorvastatin (LIPITOR) 20 MG tablet TAKE 1 TABLET DAILY AT     6:00PM 90 tablet 1   carvedilol (COREG) 6.25 MG tablet Take 1 tablet (6.25 mg total) by mouth 2 (two) times daily with a meal. 180 tablet 3   cholecalciferol (VITAMIN D) 1000 UNITS tablet Take 1,000 Units by mouth daily.     HYDROcodone-acetaminophen (NORCO/VICODIN) 5-325 MG tablet Take 1 tablet by mouth every 6 (six) hours as needed for moderate pain.     metFORMIN (GLUCOPHAGE-XR) 500 MG 24 hr tablet TAKE 1 TABLET TWICE A DAY 180 tablet 3   Multiple Vitamin (MULTIVITAMIN WITH MINERALS) TABS tablet Take 1 tablet by mouth daily.     niacin (NIASPAN) 1000 MG CR tablet TAKE 1 TABLET AT BEDTIME 90 tablet 3   TURMERIC CURCUMIN PO Take 2,000 mg by mouth in the morning and at bedtime.     warfarin (JANTOVEN) 5 MG tablet TAKE 1-2 TABLETS DAILY OR AS DIRECTED BY THE COUMADIN CLINIC 75 tablet 3   Nintedanib (OFEV) 150 MG CAPS Take 1 capsule (150 mg total) by mouth 2 (two) times daily. (Patient not taking: Reported on 09/29/2022) 180 capsule 1   No current facility-administered medications on file prior to visit.     Review of Systems  Constitutional:  Negative for fever.  Respiratory:  Positive for shortness of breath  (going up stairs). Negative for cough and wheezing.   Cardiovascular:  Negative for chest pain, palpitations and leg swelling.  Neurological:  Positive for weakness (both legs). Negative for light-headedness and headaches.       Objective:   Vitals:   10/02/22 0747  BP: 114/60  Pulse: 65  Temp: 98 F (36.7 C)  SpO2: 98%   BP Readings from Last 3 Encounters:  10/02/22 114/60  09/04/22 (!) 110/50  08/15/22 136/80   Wt Readings from Last 3 Encounters:  10/02/22 166 lb 9.6 oz (75.6 kg)  09/04/22 162 lb 8 oz (73.7 kg)  08/15/22 158 lb 6.4 oz (71.8 kg)   Body mass index is 25.33 kg/m.    Physical Exam Constitutional:      General: He is not in acute distress.    Appearance: Normal appearance. He is not ill-appearing.  HENT:     Head: Normocephalic and atraumatic.  Eyes:     Conjunctiva/sclera: Conjunctivae normal.  Cardiovascular:     Rate and Rhythm: Normal rate and regular rhythm.     Heart sounds: Normal heart sounds. No murmur heard. Pulmonary:     Effort: Pulmonary effort is normal. No respiratory distress.     Breath sounds: Normal breath  sounds. No wheezing or rales.  Musculoskeletal:     Right lower leg: No edema.     Left lower leg: No edema.  Skin:    General: Skin is warm and dry.     Findings: No rash.  Neurological:     Mental Status: He is alert. Mental status is at baseline.  Psychiatric:        Mood and Affect: Mood normal.        Lab Results  Component Value Date   WBC 5.8 08/15/2022   HGB 11.4 (L) 08/15/2022   HCT 34.8 (L) 08/15/2022   PLT 186.0 08/15/2022   GLUCOSE 89 07/18/2022   CHOL 135 04/03/2022   TRIG 138.0 04/03/2022   HDL 52.50 04/03/2022   LDLDIRECT 72.8 04/18/2011   LDLCALC 55 04/03/2022   ALT 19 07/18/2022   AST 19 07/18/2022   NA 140 07/18/2022   K 4.8 07/18/2022   CL 103 07/18/2022   CREATININE 1.30 07/18/2022   BUN 21 07/18/2022   CO2 29 07/18/2022   TSH 1.12 10/01/2019   PSA 0.6 09/22/2010   INR 2.3 08/30/2022    HGBA1C 6.7 (H) 04/03/2022   MICROALBUR 1.3 04/03/2022     Assessment & Plan:    See Problem List for Assessment and Plan of chronic medical problems.

## 2022-10-02 ENCOUNTER — Ambulatory Visit (INDEPENDENT_AMBULATORY_CARE_PROVIDER_SITE_OTHER): Payer: Medicare Other | Admitting: Internal Medicine

## 2022-10-02 ENCOUNTER — Encounter: Payer: Self-pay | Admitting: Internal Medicine

## 2022-10-02 VITALS — BP 114/60 | HR 65 | Temp 98.0°F | Ht 68.0 in | Wt 166.6 lb

## 2022-10-02 DIAGNOSIS — E1149 Type 2 diabetes mellitus with other diabetic neurological complication: Secondary | ICD-10-CM

## 2022-10-02 DIAGNOSIS — E785 Hyperlipidemia, unspecified: Secondary | ICD-10-CM

## 2022-10-02 DIAGNOSIS — N1832 Chronic kidney disease, stage 3b: Secondary | ICD-10-CM | POA: Diagnosis not present

## 2022-10-02 DIAGNOSIS — J8410 Pulmonary fibrosis, unspecified: Secondary | ICD-10-CM

## 2022-10-02 DIAGNOSIS — I25708 Atherosclerosis of coronary artery bypass graft(s), unspecified, with other forms of angina pectoris: Secondary | ICD-10-CM | POA: Diagnosis not present

## 2022-10-02 DIAGNOSIS — I82402 Acute embolism and thrombosis of unspecified deep veins of left lower extremity: Secondary | ICD-10-CM

## 2022-10-02 LAB — CBC WITH DIFFERENTIAL/PLATELET
Basophils Absolute: 0 10*3/uL (ref 0.0–0.1)
Basophils Relative: 0.8 % (ref 0.0–3.0)
Eosinophils Absolute: 0.3 10*3/uL (ref 0.0–0.7)
Eosinophils Relative: 5.2 % — ABNORMAL HIGH (ref 0.0–5.0)
HCT: 32.9 % — ABNORMAL LOW (ref 39.0–52.0)
Hemoglobin: 11.1 g/dL — ABNORMAL LOW (ref 13.0–17.0)
Lymphocytes Relative: 28.8 % (ref 12.0–46.0)
Lymphs Abs: 1.6 10*3/uL (ref 0.7–4.0)
MCHC: 33.8 g/dL (ref 30.0–36.0)
MCV: 107.2 fl — ABNORMAL HIGH (ref 78.0–100.0)
Monocytes Absolute: 0.5 10*3/uL (ref 0.1–1.0)
Monocytes Relative: 8.4 % (ref 3.0–12.0)
Neutro Abs: 3.1 10*3/uL (ref 1.4–7.7)
Neutrophils Relative %: 56.8 % (ref 43.0–77.0)
Platelets: 192 10*3/uL (ref 150.0–400.0)
RBC: 3.07 Mil/uL — ABNORMAL LOW (ref 4.22–5.81)
RDW: 14.9 % (ref 11.5–15.5)
WBC: 5.4 10*3/uL (ref 4.0–10.5)

## 2022-10-02 LAB — COMPREHENSIVE METABOLIC PANEL
ALT: 13 U/L (ref 0–53)
AST: 17 U/L (ref 0–37)
Albumin: 4 g/dL (ref 3.5–5.2)
Alkaline Phosphatase: 36 U/L — ABNORMAL LOW (ref 39–117)
BUN: 31 mg/dL — ABNORMAL HIGH (ref 6–23)
CO2: 27 mEq/L (ref 19–32)
Calcium: 9.1 mg/dL (ref 8.4–10.5)
Chloride: 105 mEq/L (ref 96–112)
Creatinine, Ser: 1.4 mg/dL (ref 0.40–1.50)
GFR: 45.58 mL/min — ABNORMAL LOW (ref >60.00)
Glucose, Bld: 222 mg/dL — ABNORMAL HIGH (ref 70–99)
Potassium: 4.2 mEq/L (ref 3.5–5.1)
Sodium: 140 mEq/L (ref 135–145)
Total Bilirubin: 0.4 mg/dL (ref 0.2–1.2)
Total Protein: 6.5 g/dL (ref 6.0–8.3)

## 2022-10-02 LAB — LIPID PANEL
Cholesterol: 123 mg/dL (ref 0–200)
HDL: 58.2 mg/dL (ref >39.00)
LDL Cholesterol: 43 mg/dL (ref 0–99)
NonHDL: 65
Total CHOL/HDL Ratio: 2
Triglycerides: 111 mg/dL (ref 0.0–149.0)
VLDL: 22.2 mg/dL (ref 0.0–40.0)

## 2022-10-02 LAB — HEMOGLOBIN A1C: Hgb A1c MFr Bld: 6.7 % — ABNORMAL HIGH (ref 4.6–6.5)

## 2022-10-02 NOTE — Assessment & Plan Note (Signed)
Chronic °Regular exercise and healthy diet encouraged °Check lipid panel  °Continue atorvastatin 20 mg daily °

## 2022-10-02 NOTE — Assessment & Plan Note (Signed)
Chronic   Lab Results  Component Value Date   HGBA1C 6.7 (H) 04/03/2022   Sugars controlled Check A1c Continue metformin XR 500 mg twice daily Stressed regular exercise, diabetic diet

## 2022-10-02 NOTE — Assessment & Plan Note (Signed)
Chronic Follow with cardiology symptoms consistent with angina Continue aspirin 81 mg daily, will add atorvastatin 20 mg daily, Coreg 6.25 mg daily, Niaspan 1000 mg at bedtime

## 2022-10-02 NOTE — Assessment & Plan Note (Signed)
Chronic ?Stable ?CMP ?

## 2022-10-02 NOTE — Assessment & Plan Note (Signed)
History of DVT Managed by cardiology On warfarin

## 2022-10-02 NOTE — Assessment & Plan Note (Signed)
Chronic Following with pulmonary Currently not on any treatment secondary to side effects

## 2022-10-04 ENCOUNTER — Ambulatory Visit: Payer: Medicare Other | Attending: Internal Medicine

## 2022-10-04 DIAGNOSIS — Z5181 Encounter for therapeutic drug level monitoring: Secondary | ICD-10-CM

## 2022-10-04 DIAGNOSIS — I82409 Acute embolism and thrombosis of unspecified deep veins of unspecified lower extremity: Secondary | ICD-10-CM | POA: Diagnosis not present

## 2022-10-04 LAB — POCT INR: INR: 2.5 (ref 2.0–3.0)

## 2022-10-04 NOTE — Patient Instructions (Signed)
-   Continue 1/2 tablet (2.5 mg) daily except 1 tablet (5 mg) on MONDAYS, WEDNESDAYS and FRIDAYS. - Recheck INR in 6 weeks Keep your green leafy vegetables- pick a day (or days)  to have your greens and have them every week 

## 2022-10-05 NOTE — Progress Notes (Signed)
Remote pacemaker transmission.   

## 2022-11-08 ENCOUNTER — Ambulatory Visit (INDEPENDENT_AMBULATORY_CARE_PROVIDER_SITE_OTHER): Payer: Medicare Other | Admitting: Pulmonary Disease

## 2022-11-08 ENCOUNTER — Encounter: Payer: Self-pay | Admitting: Pulmonary Disease

## 2022-11-08 VITALS — BP 124/62 | HR 66 | Temp 97.9°F | Ht 68.0 in | Wt 166.6 lb

## 2022-11-08 DIAGNOSIS — J8410 Pulmonary fibrosis, unspecified: Secondary | ICD-10-CM

## 2022-11-08 NOTE — Patient Instructions (Addendum)
We will hold off on any further antifibrotic medications at this time due to weight loss issues  Consider physical therapy for your balance issues in the future, I am happy to place that order if needed  Follow up in 6 months

## 2022-11-08 NOTE — Progress Notes (Signed)
Synopsis: Referred in December 2022 for shortness of breath by Billey Gosling, MD  Subjective:   [name] GENDER: male DOB: [birth year], MRN: [id number]  HPI  Chief Complaint  Patient presents with   Follow-up    Dr Annamaria Boots D/c OFEV due to weight loss October 2023.  Weight is slowly rising.  Patient feels good.  No SOB noted.   Andrew [name] is an 89 year old man, never smoker with DM II, CAD, complete heart block s/p pacemaker, CKDIII, chronic DVT and hypertension who returns to pulmonary clinic for pulmonary fibrosis.   He was seen by Dr. Annamaria Boots 08/15/22 where he continued to have weight loss being on nintedanib as he was changed from pirfendidone. He was instructed to stop nintedanib due to the persistent weight loss and development of weight loss.   He has gained a few pounds since last visit. His appetite is better. He is not having diarrhea. He does not feel much stronger since stopping the medicine.   OV 05/15/22 He continues to lose weight. He weighs 160lbs now compared to 180lbs in February prior to starting pirfenidone. He reports having no appetite but is eating 3 meals per day to take his medication. He denies diarrhea and has constipation due to chronic narcotic use.  CMP looks fine from 04/14/22  OV 02/10/22 ONO did not indicate he needed supplemental oxygen.   He started pirfenidone in early March. He recently saw neurology 02/09/22 for poor balance and generalize muscle weakness. No primary neurologic disorder suspected.  He has tolerated the medication well so far. He reports reduced appetite and has lost 10-15lbs over recent months.  OV 11/24/21 HRCT Chest scan shows widespread areas of mild ground-glass attenuation, septal thickening, subpleural reticulation, septal thickening, traction bronchiectasis and mild honeycombing with craniocaudal gradient and progression from prior scan on 11/2009. These findings are consistent with usual interstitial  pneumonia.  Echo 11/08/21 showed normal LV EF 55-60% and grade I diastolic dysfunction. RV systolic function is mildly reduced, with normal size and PASP.  He has been heating the house with a water stove which he loads with wood to burn to heat the water that then is used to heat his home and water. He has been using this source of heat since the 1980s and is exposed to smoke when loading the furnace.   OV 10/11/21 Patient reports progressive exertional dyspnea over the last 1 year.  He notices it mainly when walking up the steps in his home.  He denies any fatigue associated with the dyspnea.  He is able to rest and recover and then continue on with any further exertional activity.  He denies any chest pain with the shortness of breath.  He denies any changes in lower extremity edema as he has chronic left lower extremity edema due to a chronic DVT.  He was seen by cardiology 08/17/21 with no complaints of dyspnea documented at that time as he did not bring this to their attention. Note reviewed by Dr. Quay Burow, PCP, from 10/03/21 which noted concern for chronic dyspnea with exertion and dry crackles on exam bilaterally.   Patient denies any cough or wheezing with the shortness of breath.  He denies any nighttime awakenings with shortness of breath.  He is a never smoker.  Denies any significant secondhand smoke exposure.  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  He denies any significant dust or chemical exposures throughout his career.  He denies any significant heartburn or reflux symptoms.  Past Medical History:  Diagnosis Date   Colonic polyp 2011 last colo   colo q 46yr - [facility]    Coronary artery disease 02/2009   severe left main and three-vessel, CABG   DEGENERATIVE JOINT DISEASE    Diverticulosis of colon    DIVERTICULOSIS, COLON    History of kidney stones    HYPERLIPIDEMIA-MIXED    HYPERTENSION, BENIGN    LBP (low back  pain)    s/p decompression 8/14; ESI - [place]; RFA x 3 early 2015   Pacemaker    a. 05/2017: St. Jude (serial number [id number]) pacemaker   Symptomatic bradycardia    a. s/p St. Jude (serial number [id number]) pacemaker 06/16/17   Type II or unspecified type diabetes mellitus without mention of complication, not stated as uncontrolled    VITAMIN D DEFICIENCY      Family History  Problem Relation Age of Onset   Heart failure Mother        died age 67   Heart failure Father        died age 58   Diabetes Other    Heart disease Neg Hx      Social History   Socioeconomic History   Marital status: Married    Spouse name: Santiago Glad   Number of children: Not on file   Years of education: Not on file   Highest education level: Not on file  Occupational History   Not on file  Tobacco Use   Smoking status: Never   Smokeless tobacco: Never   Tobacco comments:    Married, 3 grown children. Retired 04/2002 from [employer] work  Scientific laboratory technician Use: Never used  Substance and Sexual Activity   Alcohol use: Yes    Comment: RARE   Drug use: No   Sexual activity: Not Currently  Other Topics Concern   Not on file  Social History Narrative   Not on file   Social Determinants of Health   Financial Resource Strain: Low Risk  (09/29/2022)   Overall Financial Resource Strain (CARDIA)    Difficulty of Paying Living Expenses: Not hard at all  Food Insecurity: No Food Insecurity (09/29/2022)   Hunger Vital Sign    Worried About Running Out of Food in the Last Year: Never true    Ran Out of Food in the Last Year: Never true  Transportation Needs: No Transportation Needs (09/29/2022)   PRAPARE - Hydrologist (Medical): No    Lack of Transportation (Non-Medical): No  Physical Activity: Insufficiently Active (09/29/2022)   Exercise Vital Sign    Days of Exercise per Week: 3 days    Minutes of Exercise per Session: 20 min  Stress: No Stress Concern  Present (09/29/2022)   [place]    Feeling of Stress : Not at all  Social Connections: [place] (09/29/2022)   Social Connection and Isolation Panel [NHANES]    Frequency of Communication with Friends and Family: More than three times a week    Frequency of Social Gatherings with Friends and Family: Three times a week    Attends Religious Services: More than 4 times per year    Active Member of Clubs or Organizations: Yes    Attends Archivist Meetings: More than 4 times per year    Marital Status: Married  Intimate Partner Violence: Not At Risk (09/29/2022)  Humiliation, Afraid, Rape, and Kick questionnaire    Fear of Current or Ex-Partner: No    Emotionally Abused: No    Physically Abused: No    Sexually Abused: No     Allergies  Allergen Reactions   Penicillins Other (See Comments) and Anaphylaxis    Taste buds     Outpatient Medications Prior to Visit  Medication Sig Dispense Refill   Acetaminophen (TYLENOL 8 HOUR PO) Take 1,000 mg by mouth 3 (three) times daily.     aspirin EC 81 MG tablet Take 81 mg by mouth daily.     atorvastatin (LIPITOR) 20 MG tablet TAKE 1 TABLET DAILY AT     6:00PM 90 tablet 1   carvedilol (COREG) 6.25 MG tablet Take 1 tablet (6.25 mg total) by mouth 2 (two) times daily with a meal. 180 tablet 3   cholecalciferol (VITAMIN D) 1000 UNITS tablet Take 1,000 Units by mouth daily.     HYDROcodone-acetaminophen (NORCO/VICODIN) 5-325 MG tablet Take 1 tablet by mouth every 6 (six) hours as needed for moderate pain.     metFORMIN (GLUCOPHAGE-XR) 500 MG 24 hr tablet TAKE 1 TABLET TWICE A DAY 180 tablet 3   Multiple Vitamin (MULTIVITAMIN WITH MINERALS) TABS tablet Take 1 tablet by mouth daily.     niacin (NIASPAN) 1000 MG CR tablet TAKE 1 TABLET AT BEDTIME 90 tablet 3   TURMERIC CURCUMIN PO Take 2,000 mg by mouth in the morning and at bedtime.     warfarin (JANTOVEN) 5 MG  tablet TAKE 1-2 TABLETS DAILY OR AS DIRECTED BY THE COUMADIN CLINIC 75 tablet 3   Nintedanib (OFEV) 150 MG CAPS Take 1 capsule (150 mg total) by mouth 2 (two) times daily. 180 capsule 1   No facility-administered medications prior to visit.   Review of Systems  Constitutional:  Positive for malaise/fatigue. Negative for chills, fever and weight loss.  HENT:  Negative for congestion, sinus pain and sore throat.   Eyes: Negative.   Respiratory:  Positive for shortness of breath. Negative for cough, hemoptysis, sputum production and wheezing.   Cardiovascular:  Negative for chest pain, palpitations, orthopnea, claudication and leg swelling.  Gastrointestinal:  Negative for abdominal pain, heartburn, nausea and vomiting.  Genitourinary: Negative.   Musculoskeletal:  Negative for joint pain and myalgias.  Skin:  Negative for rash.  Neurological:  Negative for weakness.  Endo/Heme/Allergies: Negative.   Psychiatric/Behavioral: Negative.     Objective:   Vitals:   11/08/22 0918  BP: 124/62  Pulse: 66  Temp: 97.9 F (36.6 C)  TempSrc: Oral  SpO2: 98%  Weight: 166 lb 9.6 oz (75.6 kg)  Height: 5\' 8"  (1.727 m)   Physical Exam Constitutional:      General: He is not in acute distress. HENT:     Head: Normocephalic and atraumatic.  Cardiovascular:     Rate and Rhythm: Normal rate and regular rhythm.     Pulses: Normal pulses.     Heart sounds: Normal heart sounds. No murmur heard. Pulmonary:     Breath sounds: Rales (bibasilar) present. No wheezing or rhonchi.  Musculoskeletal:     Right lower leg: No edema.     Left lower leg: No edema.  Skin:    General: Skin is warm and dry.  Neurological:     General: No focal deficit present.     Mental Status: He is alert.    CBC    Component Value Date/Time   WBC 5.4 10/02/2022 0815   RBC  3.07 (L) 10/02/2022 0815   HGB 11.1 (L) 10/02/2022 0815   HCT 32.9 (L) 10/02/2022 0815   PLT 192.0 10/02/2022 0815   MCV 107.2 (H) 10/02/2022  0815   MCH 32.5 06/16/2017 0546   MCHC 33.8 10/02/2022 0815   RDW 14.9 10/02/2022 0815   LYMPHSABS 1.6 10/02/2022 0815   MONOABS 0.5 10/02/2022 0815   EOSABS 0.3 10/02/2022 0815   BASOSABS 0.0 10/02/2022 0815      Latest Ref Rng & Units 10/02/2022    8:15 AM 07/18/2022    3:42 PM 04/13/2022    9:16 AM  BMP  Glucose 70 - 99 mg/dL 518  89  841   BUN 6 - 23 mg/dL 31  21  27    Creatinine 0.40 - 1.50 mg/dL  6.60  6.30   Sodium 135 - 145 mEq/L 140  140  139   Potassium 3.5 - 5.1 mEq/L 4.2  4.8  4.3   Chloride 96 - 112 mEq/L 105  103  106   CO2 19 - 32 mEq/L 27  29  26    Calcium 8.4 - 10.5 mg/dL 9.1  9.1  9.4    Chest imaging: HRCT Chest 10/19/21 1. The appearance of the lungs is compatible with interstitial lung disease, with a spectrum of findings considered diagnostic of usual interstitial pneumonia (UIP) per current ATS guidelines, as detailed above. 2. Aortic atherosclerosis, in addition to left main and three-vessel coronary artery disease. Status post median sternotomy for CABG including LIMA to the LAD. 3. There are calcifications of the aortic valve. Echocardiographic correlation for evaluation of potential valvular dysfunction may be warranted if clinically indicated. Small hiatal hernia.  CT Abd/Pelvis w/ contrast 05/06/21 Lower chest: Mild scarring at both lung bases. There is mild cardiomegaly status post median sternotomy. Pacemaker leads are seen within the right atrium and right ventricle. No significant pleural or pericardial effusion. There is a small hiatal hernia.  PFT:    Latest Ref Rng & Units 11/24/2021   11:01 AM  PFT Results  FVC-Pre L 2.81   FVC-Predicted Pre % 80   FVC-Post L 2.85   FVC-Predicted Post % 82   Pre FEV1/FVC % % 84   Post FEV1/FCV % % 86   FEV1-Pre L 2.36   FEV1-Predicted Pre % 98   FEV1-Post L 2.44   DLCO uncorrected ml/min/mmHg 15.24   DLCO UNC% % 68   DLCO corrected ml/min/mmHg 15.24   DLCO COR %Predicted % 68   DLVA  Predicted % 84   TLC L 4.95   TLC % Predicted % 74   RV % Predicted % 77     Labs:  Path:  Echo 11/08/21 showed normal LV EF 55-60% and grade I diastolic dysfunction. RV systolic function is mildly reduced, with normal size and PASP.  Heart Catheterization:  Assessment & Plan:   Pulmonary fibrosis (HCC)  Discussion: Andrew [name] is an 89 year old man, never smoker with DM II, CAD, complete heart block s/p pacemaker, CKDIII, chronic DVT and hypertension who returns to pulmonary clinic for pulmonary fibrosis.    He has pulmonary fibrosis as noted by his HRCT Chest. He has mild restrictive defect and mild diffusion defect based on pulmonary function tests.   He started pirfenidone in March 2023 and has tolerated 3 tablets (801mg ) three times daily but has lost 20lbs due to side effect of anorexia. He was switched to nintedanib 05/2022 and continued to lose weight and discontinued 08/15/22.   He  does not wish to resume either medication even at reduced dose. We will plan to monitor his symptoms moving forward.  He is to follow-up in 6 months.  Freda Jackson, MD [facility] Office: [phone]   Current Outpatient Medications:    Acetaminophen (TYLENOL 8 HOUR PO), Take 1,000 mg by mouth 3 (three) times daily., Disp: , Rfl:    aspirin EC 81 MG tablet, Take 81 mg by mouth daily., Disp: , Rfl:    atorvastatin (LIPITOR) 20 MG tablet, TAKE 1 TABLET DAILY AT     6:00PM, Disp: 90 tablet, Rfl: 1   carvedilol (COREG) 6.25 MG tablet, Take 1 tablet (6.25 mg total) by mouth 2 (two) times daily with a meal., Disp: 180 tablet, Rfl: 3   cholecalciferol (VITAMIN D) 1000 UNITS tablet, Take 1,000 Units by mouth daily., Disp: , Rfl:    HYDROcodone-acetaminophen (NORCO/VICODIN) 5-325 MG tablet, Take 1 tablet by mouth every 6 (six) hours as needed for moderate pain., Disp: , Rfl:    metFORMIN (GLUCOPHAGE-XR) 500 MG 24 hr tablet, TAKE 1 TABLET TWICE A DAY, Disp: 180 tablet, Rfl:  3   Multiple Vitamin (MULTIVITAMIN WITH MINERALS) TABS tablet, Take 1 tablet by mouth daily., Disp: , Rfl:    niacin (NIASPAN) 1000 MG CR tablet, TAKE 1 TABLET AT BEDTIME, Disp: 90 tablet, Rfl: 3   TURMERIC CURCUMIN PO, Take 2,000 mg by mouth in the morning and at bedtime., Disp: , Rfl:    warfarin (JANTOVEN) 5 MG tablet, TAKE 1-2 TABLETS DAILY OR AS DIRECTED BY THE COUMADIN CLINIC, Disp: 75 tablet, Rfl: 3

## 2022-11-12 ENCOUNTER — Encounter: Payer: Self-pay | Admitting: Pulmonary Disease

## 2022-11-15 ENCOUNTER — Ambulatory Visit: Payer: Medicare Other | Attending: Internal Medicine

## 2022-11-15 DIAGNOSIS — Z5181 Encounter for therapeutic drug level monitoring: Secondary | ICD-10-CM

## 2022-11-15 DIAGNOSIS — I82409 Acute embolism and thrombosis of unspecified deep veins of unspecified lower extremity: Secondary | ICD-10-CM

## 2022-11-15 LAB — POCT INR: INR: 2.9 (ref 2.0–3.0)

## 2022-11-15 NOTE — Patient Instructions (Signed)
-  Continue 1/2 tablet (2.5 mg) daily except 1 tablet (5 mg) on MONDAYS, WEDNESDAYS and FRIDAYS. - Recheck INR in 6 weeks Keep your green leafy vegetables- pick a day (or days)  to have your greens and have them every week

## 2022-12-04 ENCOUNTER — Other Ambulatory Visit: Payer: Self-pay | Admitting: Internal Medicine

## 2022-12-05 ENCOUNTER — Other Ambulatory Visit: Payer: Self-pay

## 2022-12-13 ENCOUNTER — Ambulatory Visit (INDEPENDENT_AMBULATORY_CARE_PROVIDER_SITE_OTHER): Payer: Medicare Other

## 2022-12-13 DIAGNOSIS — I442 Atrioventricular block, complete: Secondary | ICD-10-CM | POA: Diagnosis not present

## 2022-12-13 LAB — CUP PACEART REMOTE DEVICE CHECK
Battery Remaining Longevity: 64 mo
Battery Remaining Percentage: 51 %
Battery Voltage: 2.98 V
Brady Statistic AP VP Percent: 9.7 %
Brady Statistic AP VS Percent: 1 %
Brady Statistic AS VP Percent: 90 %
Brady Statistic AS VS Percent: 1 %
Brady Statistic RA Percent Paced: 9.3 %
Brady Statistic RV Percent Paced: 99 %
Date Time Interrogation Session: 20240221021157
Implantable Lead Connection Status: 753985
Implantable Lead Connection Status: 753985
Implantable Lead Implant Date: 20180825
Implantable Lead Implant Date: 20180825
Implantable Lead Location: 753859
Implantable Lead Location: 753860
Implantable Pulse Generator Implant Date: 20180825
Lead Channel Impedance Value: 440 Ohm
Lead Channel Impedance Value: 610 Ohm
Lead Channel Pacing Threshold Amplitude: 0.5 V
Lead Channel Pacing Threshold Amplitude: 0.5 V
Lead Channel Pacing Threshold Pulse Width: 0.5 ms
Lead Channel Pacing Threshold Pulse Width: 0.5 ms
Lead Channel Sensing Intrinsic Amplitude: 10.2 mV
Lead Channel Sensing Intrinsic Amplitude: 4.9 mV
Lead Channel Setting Pacing Amplitude: 0.75 V
Lead Channel Setting Pacing Amplitude: 2 V
Lead Channel Setting Pacing Pulse Width: 0.5 ms
Lead Channel Setting Sensing Sensitivity: 4 mV
Pulse Gen Model: 2272
Pulse Gen Serial Number: 8939923

## 2022-12-27 ENCOUNTER — Ambulatory Visit: Payer: Medicare Other | Attending: Internal Medicine

## 2022-12-27 DIAGNOSIS — Z5181 Encounter for therapeutic drug level monitoring: Secondary | ICD-10-CM | POA: Diagnosis not present

## 2022-12-27 DIAGNOSIS — Z86718 Personal history of other venous thrombosis and embolism: Secondary | ICD-10-CM | POA: Diagnosis not present

## 2022-12-27 DIAGNOSIS — I82409 Acute embolism and thrombosis of unspecified deep veins of unspecified lower extremity: Secondary | ICD-10-CM

## 2022-12-27 LAB — POCT INR: INR: 2.8 (ref 2.0–3.0)

## 2022-12-27 NOTE — Patient Instructions (Signed)
Description   Continue 1/2 tablet (2.5 mg) daily except 1 tablet (5 mg) on MONDAYS, WEDNESDAYS and FRIDAYS. Recheck INR in 6 weeks Keep your green leafy vegetables- pick a day (or days)  to have your greens and have them every week

## 2023-01-11 NOTE — Progress Notes (Signed)
Remote pacemaker transmission.   

## 2023-01-24 ENCOUNTER — Ambulatory Visit: Payer: Medicare Other | Attending: Internal Medicine

## 2023-01-24 DIAGNOSIS — I82409 Acute embolism and thrombosis of unspecified deep veins of unspecified lower extremity: Secondary | ICD-10-CM | POA: Diagnosis not present

## 2023-01-24 DIAGNOSIS — Z5181 Encounter for therapeutic drug level monitoring: Secondary | ICD-10-CM | POA: Diagnosis not present

## 2023-01-24 LAB — POCT INR: INR: 3.5 — AB (ref 2.0–3.0)

## 2023-01-24 NOTE — Patient Instructions (Signed)
Description   Skip today's dosage of Warfarin, then resume same dosage 1/2 tablet (2.5 mg) daily except 1 tablet (5 mg) on MONDAYS, WEDNESDAYS and FRIDAYS. Recheck INR in 4 weeks Keep your green leafy vegetables- pick a day (or days)  to have your greens and have them every week Pt states he would like to transition to 2mg  tablets like his wife is on when he completes his current rx of Warfarin.  Pt states he has several weeks of current Warfarin prescription left.  Pt will call when ready to refill Warfarin [phone].

## 2023-02-13 IMAGING — CT CT L SPINE W/ CM
1 of 7 series · 5 of 14 positions shown, 7 images · IV contrast (isovue)
Comparison: Myelogram same day.  CT 06/15/2020.

CLINICAL DATA: History of lumbosacral spondylosis back pain

EXAM:
CT MYELOGRAPHY LUMBAR SPINE
TECHNIQUE: CT imaging of the lumbar spine was performed after Isovue 200M
contrast administration. Multiplanar CT image reconstructions were
also generated.
CONTRAST:  Intrathecal contrast injection as described at the
myelography report.

[Series 3: l spine soft · axial · 0.33mm/px · z∈[-205,-37]mm · 5 of 128 slices shown, 7 images]
[im 22/128  soft-tissue]
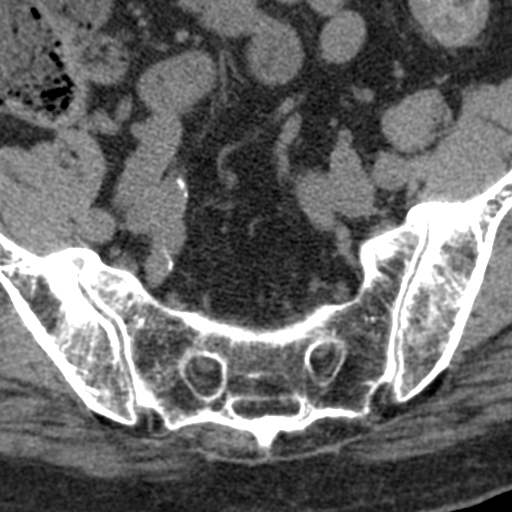
[im 22/128  bone]
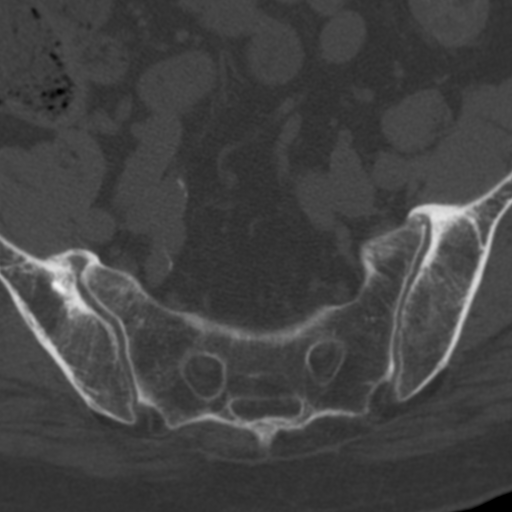
[im 43/128  bone]
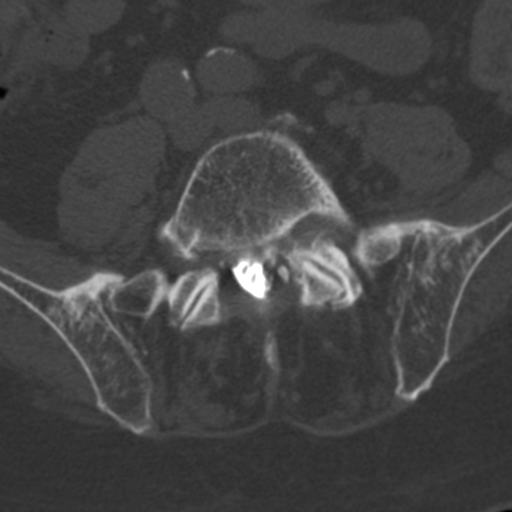
[im 64/128  bone]
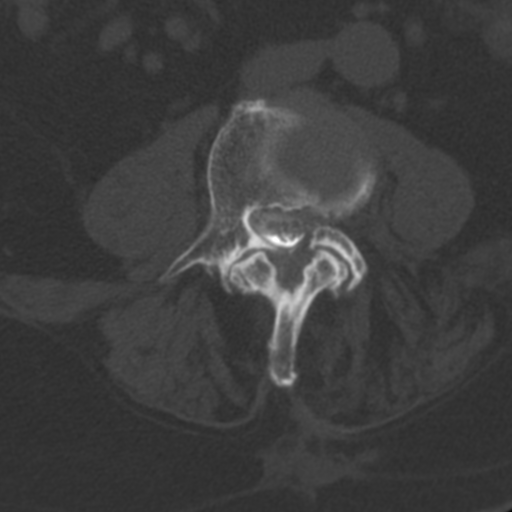
[im 85/128  bone]
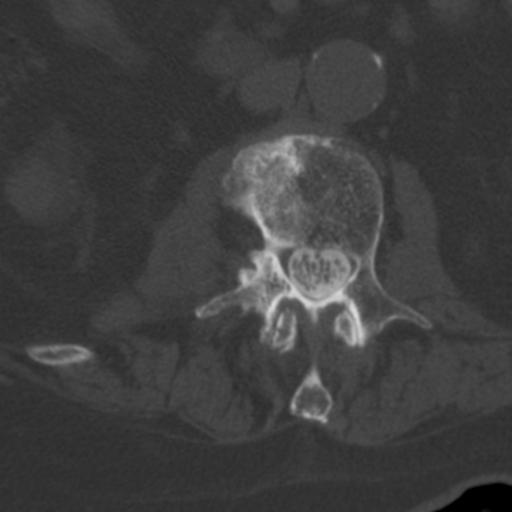
[im 106/128  soft-tissue]
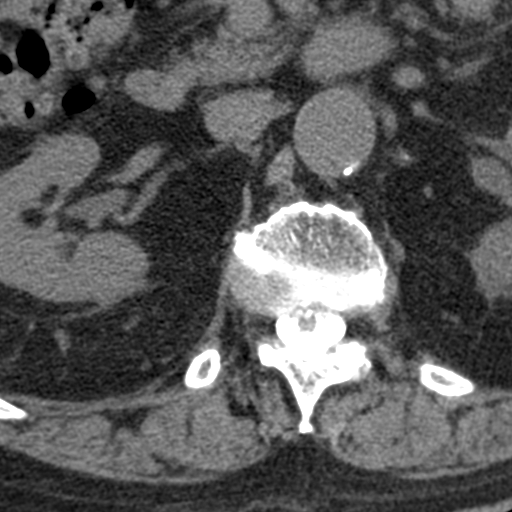
[im 106/128  bone]
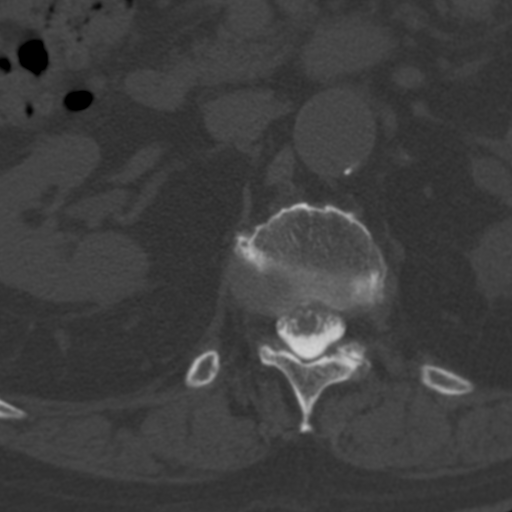

[5 of 14 positions shown; findings below may reference images not displayed]

FINDINGS: Segmentation: 5 lumbar type vertebral bodies as numbered previously.

Alignment: Mild scoliotic curvature convex to the left the apex at
L2.

Vertebrae: No fracture or primary bone lesion.

Conus medullaris: Extends to the L1 level and appears normal.

Paraspinal and other soft tissues: Aortic atherosclerosis.
Nonobstructing stones in the right kidney. Maximal diameter of the
infrarenal abdominal aorta 3 cm.

Disc levels:

T11-12 and T12-L1: Disc degeneration with vacuum phenomenon. Minimal
disc bulges. No compressive stenosis.

L1-2: Disc degeneration with vacuum phenomenon and loss of height.
Shallow right posterolateral disc herniation with slight caudal
migration. Facet degeneration right worse than left. Mild stenosis
of the right lateral recess but without visible neural compression
in that location. Foraminal stenosis right worse than left. The
right L1 nerve could be compressed.

L2-3: Disc degeneration with complete loss of disc height. Endplate
osteophytes more prominent towards the right. Facet and ligamentous
hypertrophy. Stenosis of the lateral recesses and foramina, right
worse than left. Neural compression quite possible at this level
particularly on the right.

L3-4: Disc degeneration of vacuum phenomenon. Endplate osteophytes
and mild bulging of the disc. Facet and ligamentous hypertrophy.
Stenosis of both lateral recesses that could cause neural
compression on either side. Mild to moderate bilateral foraminal
stenosis.

L4-5: Disc degeneration with loss of height and vacuum phenomenon.
Endplate osteophytes and bulging of the disc. Facet degeneration and
hypertrophy worse on the left. Stenosis of the lateral recesses and
neural foramina left worse than right.

L5-S1: Disc degeneration with loss of height and vacuum phenomenon.
Endplate osteophytes and bulging of the disc more prominent towards
the left. Facet degeneration worse on the left. Stenosis of the
subarticular lateral recess and intervertebral foramen on the left
likely to cause left-sided neural compression.
IMPRESSION: Curvature convex to the left. Multilevel degenerative disc disease
and degenerative facet disease that could in general relate to back
pain.

L1-2: Lateral recess and foraminal stenosis on the right that could
compress in particular the right L1 nerve.

L2-3: Lateral recess and foraminal stenosis, worse on the right,
which could cause right-sided neural compression.

L3-4: Bilateral lateral recess and foraminal stenosis that could
cause neural compression on either side.

L4-5: Lateral recess and foraminal stenosis, worse on the left,
which could cause left-sided neural compression.

L5-S1: Left lateral recess and foraminal stenosis that would seem
likely to cause left-sided neural compression.

No change discernible when compared to the ordinary lumbar CT from
[place] of last year.

Aortoiliac atherosclerotic disease. Maximal diameter of the
infrarenal aorta is 3 cm. Recommend follow-up ultrasound every 3
years. This recommendation follows ACR consensus guidelines: White
Paper of the ACR Incidental Findings Committee II on Vascular
Findings. [HOSPITAL] 6610; [DATE].

## 2023-02-21 ENCOUNTER — Ambulatory Visit: Payer: Medicare Other | Attending: Internal Medicine | Admitting: *Deleted

## 2023-02-21 DIAGNOSIS — I82409 Acute embolism and thrombosis of unspecified deep veins of unspecified lower extremity: Secondary | ICD-10-CM | POA: Diagnosis not present

## 2023-02-21 DIAGNOSIS — Z5181 Encounter for therapeutic drug level monitoring: Secondary | ICD-10-CM | POA: Diagnosis not present

## 2023-02-21 LAB — POCT INR: INR: 3 (ref 2.0–3.0)

## 2023-02-21 NOTE — Patient Instructions (Signed)
Continue warfarin 1/2 tablet (2.5 mg) daily except 1 tablet (5 mg) on MONDAYS, WEDNESDAYS and FRIDAYS. Recheck INR in 4 weeks Keep your green leafy vegetables- pick a day (or days)  to have your greens and have them every week Pt states he would like to transition to 2mg  tablets like his wife is on when he completes his current rx of Warfarin.  Pt states he has several weeks of current Warfarin prescription left.  Pt will call when ready to refill Warfarin [phone].

## 2023-03-14 ENCOUNTER — Ambulatory Visit (INDEPENDENT_AMBULATORY_CARE_PROVIDER_SITE_OTHER): Payer: Medicare Other

## 2023-03-14 DIAGNOSIS — I442 Atrioventricular block, complete: Secondary | ICD-10-CM | POA: Diagnosis not present

## 2023-03-14 LAB — CUP PACEART REMOTE DEVICE CHECK
Battery Remaining Longevity: 61 mo
Battery Remaining Percentage: 49 %
Battery Voltage: 2.98 V
Brady Statistic AP VP Percent: 11 %
Brady Statistic AP VS Percent: 1 %
Brady Statistic AS VP Percent: 88 %
Brady Statistic AS VS Percent: 1 %
Brady Statistic RA Percent Paced: 11 %
Brady Statistic RV Percent Paced: 99 %
Date Time Interrogation Session: 20240522035206
Implantable Lead Connection Status: 753985
Implantable Lead Connection Status: 753985
Implantable Lead Implant Date: 20180825
Implantable Lead Implant Date: 20180825
Implantable Lead Location: 753859
Implantable Lead Location: 753860
Implantable Pulse Generator Implant Date: 20180825
Lead Channel Impedance Value: 410 Ohm
Lead Channel Impedance Value: 640 Ohm
Lead Channel Pacing Threshold Amplitude: 0.5 V
Lead Channel Pacing Threshold Amplitude: 0.5 V
Lead Channel Pacing Threshold Pulse Width: 0.5 ms
Lead Channel Pacing Threshold Pulse Width: 0.5 ms
Lead Channel Sensing Intrinsic Amplitude: 10.2 mV
Lead Channel Sensing Intrinsic Amplitude: 3.1 mV
Lead Channel Setting Pacing Amplitude: 0.75 V
Lead Channel Setting Pacing Amplitude: 2 V
Lead Channel Setting Pacing Pulse Width: 0.5 ms
Lead Channel Setting Sensing Sensitivity: 4 mV
Pulse Gen Model: 2272
Pulse Gen Serial Number: 8939923

## 2023-03-18 ENCOUNTER — Emergency Department (HOSPITAL_COMMUNITY): Payer: Medicare Other

## 2023-03-18 ENCOUNTER — Encounter (HOSPITAL_COMMUNITY): Payer: Self-pay

## 2023-03-18 ENCOUNTER — Observation Stay (HOSPITAL_COMMUNITY)
Admission: EM | Admit: 2023-03-18 | Discharge: 2023-03-19 | Disposition: A | Discharge status: Home / Self Care | Payer: Medicare Other | Attending: Family Medicine | Admitting: Family Medicine

## 2023-03-18 DIAGNOSIS — D72829 Elevated white blood cell count, unspecified: Secondary | ICD-10-CM | POA: Insufficient documentation

## 2023-03-18 DIAGNOSIS — E785 Hyperlipidemia, unspecified: Secondary | ICD-10-CM | POA: Diagnosis present

## 2023-03-18 DIAGNOSIS — E1149 Type 2 diabetes mellitus with other diabetic neurological complication: Secondary | ICD-10-CM | POA: Diagnosis present

## 2023-03-18 DIAGNOSIS — I82409 Acute embolism and thrombosis of unspecified deep veins of unspecified lower extremity: Secondary | ICD-10-CM | POA: Diagnosis present

## 2023-03-18 DIAGNOSIS — Z7982 Long term (current) use of aspirin: Secondary | ICD-10-CM | POA: Insufficient documentation

## 2023-03-18 DIAGNOSIS — I129 Hypertensive chronic kidney disease with stage 1 through stage 4 chronic kidney disease, or unspecified chronic kidney disease: Secondary | ICD-10-CM | POA: Diagnosis not present

## 2023-03-18 DIAGNOSIS — R791 Abnormal coagulation profile: Secondary | ICD-10-CM | POA: Diagnosis present

## 2023-03-18 DIAGNOSIS — E1122 Type 2 diabetes mellitus with diabetic chronic kidney disease: Secondary | ICD-10-CM | POA: Diagnosis not present

## 2023-03-18 DIAGNOSIS — N183 Chronic kidney disease, stage 3 unspecified: Secondary | ICD-10-CM | POA: Diagnosis present

## 2023-03-18 DIAGNOSIS — M7981 Nontraumatic hematoma of soft tissue: Principal | ICD-10-CM | POA: Insufficient documentation

## 2023-03-18 DIAGNOSIS — M25551 Pain in right hip: Secondary | ICD-10-CM

## 2023-03-18 DIAGNOSIS — Z86718 Personal history of other venous thrombosis and embolism: Secondary | ICD-10-CM | POA: Diagnosis not present

## 2023-03-18 DIAGNOSIS — I10 Essential (primary) hypertension: Secondary | ICD-10-CM | POA: Diagnosis not present

## 2023-03-18 DIAGNOSIS — J8410 Pulmonary fibrosis, unspecified: Secondary | ICD-10-CM | POA: Diagnosis present

## 2023-03-18 DIAGNOSIS — Z79899 Other long term (current) drug therapy: Secondary | ICD-10-CM | POA: Diagnosis not present

## 2023-03-18 DIAGNOSIS — Z951 Presence of aortocoronary bypass graft: Secondary | ICD-10-CM | POA: Diagnosis not present

## 2023-03-18 DIAGNOSIS — N179 Acute kidney failure, unspecified: Secondary | ICD-10-CM | POA: Diagnosis not present

## 2023-03-18 DIAGNOSIS — Z950 Presence of cardiac pacemaker: Secondary | ICD-10-CM | POA: Insufficient documentation

## 2023-03-18 DIAGNOSIS — T148XXA Other injury of unspecified body region, initial encounter: Secondary | ICD-10-CM | POA: Diagnosis present

## 2023-03-18 DIAGNOSIS — E0822 Diabetes mellitus due to underlying condition with diabetic chronic kidney disease: Secondary | ICD-10-CM | POA: Diagnosis present

## 2023-03-18 DIAGNOSIS — Z7901 Long term (current) use of anticoagulants: Secondary | ICD-10-CM | POA: Insufficient documentation

## 2023-03-18 DIAGNOSIS — Z7984 Long term (current) use of oral hypoglycemic drugs: Secondary | ICD-10-CM | POA: Insufficient documentation

## 2023-03-18 DIAGNOSIS — I2510 Atherosclerotic heart disease of native coronary artery without angina pectoris: Secondary | ICD-10-CM | POA: Insufficient documentation

## 2023-03-18 DIAGNOSIS — N1832 Chronic kidney disease, stage 3b: Secondary | ICD-10-CM | POA: Diagnosis present

## 2023-03-18 LAB — BASIC METABOLIC PANEL
Anion gap: 8 (ref 5–15)
BUN: 44 mg/dL — ABNORMAL HIGH (ref 8–23)
CO2: 22 mmol/L (ref 22–32)
Calcium: 8.9 mg/dL (ref 8.9–10.3)
Chloride: 107 mmol/L (ref 98–111)
Creatinine, Ser: 1.51 mg/dL — ABNORMAL HIGH (ref 0.61–1.24)
GFR, Estimated: 45 mL/min — ABNORMAL LOW (ref >60)
Glucose, Bld: 172 mg/dL — ABNORMAL HIGH (ref 70–99)
Potassium: 4.3 mmol/L (ref 3.5–5.1)
Sodium: 137 mmol/L (ref 135–145)

## 2023-03-18 LAB — CBC
HCT: 31.3 % — ABNORMAL LOW (ref 39.0–52.0)
Hemoglobin: 10.2 g/dL — ABNORMAL LOW (ref 13.0–17.0)
MCH: 34.9 pg — ABNORMAL HIGH (ref 26.0–34.0)
MCHC: 32.6 g/dL (ref 30.0–36.0)
MCV: 107.2 fL — ABNORMAL HIGH (ref 80.0–100.0)
Platelets: 190 10*3/uL (ref 150–400)
RBC: 2.92 MIL/uL — ABNORMAL LOW (ref 4.22–5.81)
RDW: 14.6 % (ref 11.5–15.5)
WBC: 13.5 10*3/uL — ABNORMAL HIGH (ref 4.0–10.5)
nRBC: 0 % (ref 0.0–0.2)

## 2023-03-18 LAB — PROTIME-INR
INR: 3.5 — ABNORMAL HIGH (ref 0.8–1.2)
Prothrombin Time: 35.3 seconds — ABNORMAL HIGH (ref 11.4–15.2)

## 2023-03-18 LAB — GLUCOSE, CAPILLARY
Glucose-Capillary: 148 mg/dL — ABNORMAL HIGH (ref 70–99)
Glucose-Capillary: 163 mg/dL — ABNORMAL HIGH (ref 70–99)

## 2023-03-18 MED ORDER — TRAZODONE HCL 50 MG PO TABS: 25.0000 mg | ORAL_TABLET | Freq: Every evening | ORAL | Status: DC | PRN

## 2023-03-18 MED ORDER — ONDANSETRON HCL 4 MG/2ML IJ SOLN: 4.0000 mg | Freq: Four times a day (QID) | INTRAMUSCULAR | Status: DC | PRN

## 2023-03-18 MED ORDER — INSULIN ASPART 100 UNIT/ML IJ SOLN
0.0000 [IU] | Freq: Every day | INTRAMUSCULAR | Status: DC
  Filled 2023-03-18: qty 0.05

## 2023-03-18 MED ORDER — ACETAMINOPHEN 325 MG PO TABS: 650.0000 mg | ORAL_TABLET | Freq: Four times a day (QID) | ORAL | Status: DC | PRN

## 2023-03-18 MED ORDER — NIACIN ER (ANTIHYPERLIPIDEMIC) 500 MG PO TBCR
1000.0000 mg | EXTENDED_RELEASE_TABLET | Freq: Every day | ORAL | Status: DC
  Administered 2023-03-18: 1000 mg via ORAL
  Filled 2023-03-18: qty 2

## 2023-03-18 MED ORDER — ACETAMINOPHEN 650 MG RE SUPP: 650.0000 mg | Freq: Four times a day (QID) | RECTAL | Status: DC | PRN

## 2023-03-18 MED ORDER — METOPROLOL TARTRATE 5 MG/5ML IV SOLN: 5.0000 mg | Freq: Four times a day (QID) | INTRAVENOUS | Status: DC | PRN

## 2023-03-18 MED ORDER — ALBUTEROL SULFATE (2.5 MG/3ML) 0.083% IN NEBU: 2.5000 mg | INHALATION_SOLUTION | RESPIRATORY_TRACT | Status: DC | PRN

## 2023-03-18 MED ORDER — HYDROMORPHONE HCL 1 MG/ML IJ SOLN
1.0000 mg | Freq: Once | INTRAMUSCULAR | Status: AC
  Administered 2023-03-18: 1 mg via INTRAVENOUS
  Filled 2023-03-18: qty 1

## 2023-03-18 MED ORDER — VITAMIN K1 10 MG/ML IJ SOLN
5.0000 mg | Freq: Once | INTRAVENOUS | Status: AC
  Administered 2023-03-18: 5 mg via INTRAVENOUS
  Filled 2023-03-18: qty 0.5

## 2023-03-18 MED ORDER — SODIUM CHLORIDE 0.9 % IV SOLN: INTRAVENOUS | Status: DC

## 2023-03-18 MED ORDER — HYDROMORPHONE HCL 1 MG/ML IJ SOLN: 1.0000 mg | Freq: Once | INTRAMUSCULAR | Status: DC

## 2023-03-18 MED ORDER — HYDROMORPHONE HCL 1 MG/ML IJ SOLN
0.5000 mg | INTRAMUSCULAR | Status: DC | PRN
  Administered 2023-03-18 – 2023-03-19 (×3): 0.5 mg via INTRAVENOUS
  Filled 2023-03-18 (×4): qty 0.5

## 2023-03-18 MED ORDER — FENTANYL CITRATE PF 50 MCG/ML IJ SOSY
50.0000 ug | PREFILLED_SYRINGE | Freq: Once | INTRAMUSCULAR | Status: AC
  Administered 2023-03-18: 50 ug via INTRAMUSCULAR
  Filled 2023-03-18: qty 1

## 2023-03-18 MED ORDER — CARVEDILOL 6.25 MG PO TABS
6.2500 mg | ORAL_TABLET | Freq: Two times a day (BID) | ORAL | Status: DC
  Administered 2023-03-18 – 2023-03-19 (×2): 6.25 mg via ORAL
  Filled 2023-03-18 (×2): qty 1

## 2023-03-18 MED ORDER — INSULIN ASPART 100 UNIT/ML IJ SOLN
0.0000 [IU] | Freq: Three times a day (TID) | INTRAMUSCULAR | Status: DC
  Administered 2023-03-18 – 2023-03-19 (×2): 2 [IU] via SUBCUTANEOUS
  Filled 2023-03-18: qty 0.15

## 2023-03-18 MED ORDER — HYDROCODONE-ACETAMINOPHEN 5-325 MG PO TABS
1.0000 | ORAL_TABLET | Freq: Four times a day (QID) | ORAL | Status: DC | PRN
  Administered 2023-03-18: 1 via ORAL
  Filled 2023-03-18: qty 1

## 2023-03-18 MED ORDER — ATORVASTATIN CALCIUM 10 MG PO TABS
20.0000 mg | ORAL_TABLET | Freq: Every evening | ORAL | Status: DC
  Administered 2023-03-18: 20 mg via ORAL
  Filled 2023-03-18: qty 2

## 2023-03-18 MED ORDER — ONDANSETRON HCL 4 MG PO TABS: 4.0000 mg | ORAL_TABLET | Freq: Four times a day (QID) | ORAL | Status: DC | PRN

## 2023-03-18 NOTE — ED Provider Notes (Signed)
EMERGENCY DEPARTMENT AT [facility] Provider Note   CSN: [id number] Arrival date & time: 03/18/23  7829     History  Chief Complaint  Patient presents with   Hip Pain    Andrew [name] is a 89 y.o. male with a past medical history of CAD, low back pain status post decompression 8/14, type 2 diabetes presents today for evaluation of hip pain.  Patient reports worsening hip pain started last night.  Pain started from his right hip and go down to his right leg.  Patient contacted his PCP and was given prednisone.  He also taking oxycodone with no relief.  He denies any back pain, leg swelling, fever, nausea or vomiting.  Patient states that he walked back to his car after dinner okay last night but this morning he was not able to put weight on his right leg.  He denies any urinary symptoms, bowel incontinence, saddle anesthesia or distal weakness.  Hip Pain      Past Medical History:  Diagnosis Date   Colonic polyp 2011 last colo   colo q 29yr - [facility]    Coronary artery disease 02/2009   severe left main and three-vessel, CABG   DEGENERATIVE JOINT DISEASE    Diverticulosis of colon    DIVERTICULOSIS, COLON    History of kidney stones    HYPERLIPIDEMIA-MIXED    HYPERTENSION, BENIGN    LBP (low back pain)    s/p decompression 8/14; ESI - [place]; RFA x 3 early 2015   Pacemaker    a. 05/2017: St. Jude (serial number [id number]) pacemaker   Symptomatic bradycardia    a. s/p St. Jude (serial number [id number]) pacemaker 06/16/17   Type II or unspecified type diabetes mellitus without mention of complication, not stated as uncontrolled    VITAMIN D DEFICIENCY    Past Surgical History:  Procedure Laterality Date   CARPAL TUNNEL RELEASE Right    CATARACT EXTRACTION, BILATERAL     R 07/07/13, L 07/21/13   CORONARY ARTERY BYPASS GRAFT  03/17/09   x 4   CYSTOSCOPY WITH STENT PLACEMENT Left 05/10/2021   Procedure: CYSTOSCOPY WITH LEFT  STENT PLACEMENT,LEFT  RETROGRADE PYELOGRAM;  Surgeon: Crist Fat, MD;  Location: [facility];  Service: Urology;  Laterality: Left;   CYSTOSCOPY/URETEROSCOPY/HOLMIUM LASER/STENT PLACEMENT Left 06/03/2021   Procedure: CYSTOSCOPY, LEFT RETROGRADE, URETEROSCOPY,HOLMIUM LASER,STENT EXCHANGE;  Surgeon: Crist Fat, MD;  Location: [facility];  Service: Urology;  Laterality: Left;   INGUINAL HERNIA REPAIR  1980   KNEE ARTHROSCOPY  2007   right   LUMBAR EPIDURAL INJECTION  2013   Has as needed.   LUMBAR LAMINECTOMY/DECOMPRESSION MICRODISCECTOMY N/A 06/19/2013   Procedure: LUMBAR LAMINECTOMY/DECOMPRESSION MICRODISCECTOMY;  Surgeon: Emilee Hero, MD;  Location: [facility] OR;  Service: Orthopedics;  Laterality: N/A;  Lumbar 4-5 decompression   PACEMAKER IMPLANT N/A 06/16/2017   Procedure: Pacemaker Implant;  Surgeon: Marinus Maw, MD;  Location: [facility] INVASIVE CV LAB;  Service: Cardiovascular;  Laterality: N/A;   TRIGGER FINGER RELEASE Right      Home Medications Prior to Admission medications   Medication Sig Start Date End Date Taking? Authorizing Provider  atorvastatin (LIPITOR) 20 MG tablet TAKE 1 TABLET DAILY AT     6:00PM 12/05/22   Pincus Sanes, MD  Acetaminophen (TYLENOL 8 HOUR PO) Take 1,000 mg by mouth 3 (three) times daily.    [provider]  aspirin EC 81 MG tablet Take 81 mg by mouth daily.  [provider]  carvedilol (COREG) 6.25 MG tablet Take 1 tablet (6.25 mg total) by mouth 2 (two) times daily with a meal. 09/04/22   Gollan, Tollie Pizza, MD  cholecalciferol (VITAMIN D) 1000 UNITS tablet Take 1,000 Units by mouth daily.    [provider]  HYDROcodone-acetaminophen (NORCO/VICODIN) 5-325 MG tablet Take 1 tablet by mouth every 6 (six) hours as needed for moderate pain.    [provider]  metFORMIN (GLUCOPHAGE-XR) 500 MG 24 hr tablet TAKE 1 TABLET TWICE A DAY 02/14/22   Pincus Sanes, MD  Multiple Vitamin (MULTIVITAMIN WITH MINERALS) TABS tablet Take 1 tablet by  mouth daily.    [provider]  niacin (NIASPAN) 1000 MG CR tablet TAKE 1 TABLET AT BEDTIME 03/08/22   Burns, Bobette Mo, MD  TURMERIC CURCUMIN PO Take 2,000 mg by mouth in the morning and at bedtime.    [provider]  warfarin (JANTOVEN) 5 MG tablet TAKE 1-2 TABLETS DAILY OR AS DIRECTED BY THE COUMADIN CLINIC 06/02/22   Antonieta Iba, MD      Allergies    Penicillins    Review of Systems   Review of Systems Negative except as per HPI.  Physical Exam Updated Vital Signs BP 139/74 (BP Location: Right Arm)   Pulse 64   Temp 97.6 F (36.4 C) (Oral)   Resp 18   SpO2 99%  Physical Exam Vitals and nursing note reviewed.  Constitutional:      Appearance: Normal appearance.  HENT:     Head: Normocephalic and atraumatic.     Mouth/Throat:     Mouth: Mucous membranes are moist.  Eyes:     General: No scleral icterus. Cardiovascular:     Rate and Rhythm: Normal rate and regular rhythm.     Pulses: Normal pulses.     Heart sounds: Normal heart sounds.  Pulmonary:     Effort: Pulmonary effort is normal.     Breath sounds: Normal breath sounds.  Abdominal:     General: Abdomen is flat.     Palpations: Abdomen is soft.     Tenderness: There is no abdominal tenderness.  Musculoskeletal:        General: No deformity.     Comments: Patient was able to lift his right leg against gravity, not able to hold it up due to pain.  Skin:    General: Skin is warm.     Findings: No rash.  Neurological:     General: No focal deficit present.     Mental Status: He is alert.  Psychiatric:        Mood and Affect: Mood normal.     ED Results / Procedures / Treatments   Labs (all labs ordered are listed, but only abnormal results are displayed) Labs Reviewed - No data to display  EKG None  Radiology No results found.  Procedures Procedures    Medications Ordered in ED Medications  HYDROmorphone (DILAUDID) injection 1 mg (has no administration in time range)     ED Course/ Medical Decision Making/ A&P                             Medical Decision Making Amount and/or Complexity of Data Reviewed Labs: ordered. Radiology: ordered.  Risk Prescription drug management.   This patient presents to the ED for hip pain, this involves an extensive number of treatment options, and is a complaint that carries with a  high risk of complications and morbidity.  The differential diagnosis includes fracture, dislocation, hematoma, neoplasm, infectious etiology.  This is not an exhaustive list.  Lab tests: I ordered and personally interpreted labs.  The pertinent results include: WBC 13.5. Hbg 10.2. Platelets unremarkable. Electrolytes unremarkable. BUN 44, creatinine 1.51.  INR 3.5 supra therapeutic.  Imaging studies: I ordered imaging studies. I personally reviewed, interpreted imaging and agree with the radiologist's interpretations. The results include: X-ray of the right hip showed no fracture or dislocation.  CT hip showed a 11.6 cm mixed density in the right gluteus maximus, not excluding soft tissue neoplasm.  Problem list/ ED course/ Critical interventions/ Medical management: HPI: See above Vital signs within normal range and stable throughout visit. Laboratory/imaging studies significant for: See above. On physical examination, patient is afebrile and appears in no acute distress.  There was tenderness to palpation to the right hip.  No obvious hematoma or swelling noted on my examination.  Patient was able to lift his right leg but was not able to hold it for long due to pain.  X-ray of the hip did not show any evidence of fracture or dislocation.  CT head show a 11.6 mm mixed density in the right gluteus maximus, not including soft tissue neoplasm.  CBC with white count of 13.5.  Hemoglobin was 10.2 slightly lower than his baseline around 11.  Given fentanyl and Dilaudid for pain.  Reevaluation of patient's after this medication showed that patient  remained the same.  Nursing staff tried to ambulate patient.  He was able to stand but not able to take any steps due to pain.  Patient will require admission for pain control, PT/OT and elevated INR. I have reviewed the patient home medicines and have made adjustments as needed.  Cardiac monitoring/EKG: The patient was maintained on a cardiac monitor.  I personally reviewed and interpreted the cardiac monitor which showed an underlying rhythm of: sinus rhythm.  Additional history obtained: External records from outside source obtained and reviewed including: Chart review including previous notes, labs, imaging.  Consultations obtained: I requested consultation with Dr. Erenest Blank [facility] hospitalist, and discussed lab and imaging findings as well as pertinent plan.  He agreed to admit the patient.  Disposition Admit. This chart was dictated using voice recognition software.  Despite best efforts to proofread,  errors can occur which can change the documentation meaning.          Final Clinical Impression(s) / ED Diagnoses Final diagnoses:  Right hip pain    Rx / DC Orders ED Discharge Orders     None         Jeanelle Malling, [place] 03/18/23 1317    Maia Plan, MD 03/20/23 2222

## 2023-03-18 NOTE — ED Notes (Signed)
ED TO INPATIENT HANDOFF REPORT  ED Nurse Name and Phone #: Thamas Jaegers Name/Age/Gender Andrew [name] 89 y.o. male Room/Bed: WA05/WA05  Code Status   Code Status: Full Code  Home/SNF/Other Home Patient oriented to: self, place, time, and situation Is this baseline? Yes   Triage Complete: Triage complete  Chief Complaint Hematoma [T14.8XXA]  Triage Note Pt arrived via EMS, saw PCP last week for right hip pain, given him pain meds and prednisone. Pain worsening, unable to bear weight. No trauma to area.    Allergies Allergies  Allergen Reactions   Penicillins Other (See Comments) and Anaphylaxis    Taste buds    Level of Care/Admitting Diagnosis ED Disposition     ED Disposition  Admit   Condition  --   Comment  Hospital Area: [facility] [[id number]]  Level of Care: Med-Surg [16]  May place patient in observation at [facility] or [facility] if equivalent level of care is available:: Yes  Covid Evaluation: Asymptomatic - no recent exposure (last 10 days) testing not required  Diagnosis: Hematoma [244362]  Admitting Physician: Maryln Gottron [[id number]]  Attending Physician: Kirby Crigler, MIR [email or web address] [[id number]]          B Medical/Surgery History Past Medical History:  Diagnosis Date   Colonic polyp 2011 last colo   colo q 16yr - [facility]    Coronary artery disease 02/2009   severe left main and three-vessel, CABG   DEGENERATIVE JOINT DISEASE    Diverticulosis of colon    DIVERTICULOSIS, COLON    History of kidney stones    HYPERLIPIDEMIA-MIXED    HYPERTENSION, BENIGN    LBP (low back pain)    s/p decompression 8/14; ESI - [place]; RFA x 3 early 2015   Pacemaker    a. 05/2017: St. Jude (serial number [id number]) pacemaker   Symptomatic bradycardia    a. s/p St. Jude (serial number [id number]) pacemaker 06/16/17   Type II or unspecified type diabetes mellitus without mention of complication, not stated as uncontrolled    VITAMIN D DEFICIENCY     Past Surgical History:  Procedure Laterality Date   CARPAL TUNNEL RELEASE Right    CATARACT EXTRACTION, BILATERAL     R 07/07/13, L 07/21/13   CORONARY ARTERY BYPASS GRAFT  03/17/09   x 4   CYSTOSCOPY WITH STENT PLACEMENT Left 05/10/2021   Procedure: CYSTOSCOPY WITH LEFT  STENT PLACEMENT,LEFT RETROGRADE PYELOGRAM;  Surgeon: Crist Fat, MD;  Location: [facility];  Service: Urology;  Laterality: Left;   CYSTOSCOPY/URETEROSCOPY/HOLMIUM LASER/STENT PLACEMENT Left 06/03/2021   Procedure: CYSTOSCOPY, LEFT RETROGRADE, URETEROSCOPY,HOLMIUM LASER,STENT EXCHANGE;  Surgeon: Crist Fat, MD;  Location: [facility];  Service: Urology;  Laterality: Left;   INGUINAL HERNIA REPAIR  1980   KNEE ARTHROSCOPY  2007   right   LUMBAR EPIDURAL INJECTION  2013   Has as needed.   LUMBAR LAMINECTOMY/DECOMPRESSION MICRODISCECTOMY N/A 06/19/2013   Procedure: LUMBAR LAMINECTOMY/DECOMPRESSION MICRODISCECTOMY;  Surgeon: Emilee Hero, MD;  Location: [facility] OR;  Service: Orthopedics;  Laterality: N/A;  Lumbar 4-5 decompression   PACEMAKER IMPLANT N/A 06/16/2017   Procedure: Pacemaker Implant;  Surgeon: Marinus Maw, MD;  Location: [facility] INVASIVE CV LAB;  Service: Cardiovascular;  Laterality: N/A;   TRIGGER FINGER RELEASE Right      A IV Location/Drains/Wounds Patient Lines/Drains/Airways Status     Active Line/Drains/Airways     Name Placement date Placement time Site Days   Peripheral IV 03/18/23 20 G Right Antecubital 03/18/23  1110  Antecubital  less than 1   Ureteral Drain/Stent Left ureter 6 Fr. 06/03/21  0931  Left ureter  653   Incision (Closed) 05/10/21 Perineum 05/10/21  1758  -- 677            Intake/Output Last 24 hours No intake or output data in the 24 hours ending 03/18/23 1345  Labs/Imaging Results for orders placed or performed during the hospital encounter of 03/18/23 (from the past 48 hour(s))  CBC     Status: Abnormal   Collection Time: 03/18/23 10:45 AM  Result Value Ref  Range   WBC 13.5 (H) 4.0 - 10.5 K/uL   RBC 2.92 (L) 4.22 - 5.81 MIL/uL   Hemoglobin 10.2 (L) 13.0 - 17.0 g/dL   HCT 16.1 (L) 09.6 - 04.5 %   MCV 107.2 (H) 80.0 - 100.0 fL   MCH 34.9 (H) 26.0 - 34.0 pg   MCHC 32.6 30.0 - 36.0 g/dL   RDW 40.9 81.1 - 91.4 %   Platelets 190 150 - 400 K/uL   nRBC 0.0 0.0 - 0.2 %    Comment: Performed at [facility], [address] [place], [place] [address]  Basic metabolic panel     Status: Abnormal   Collection Time: 03/18/23 10:45 AM  Result Value Ref Range   Sodium 137 135 - 145 mmol/L   Potassium 4.3 3.5 - 5.1 mmol/L   Chloride 107 98 - 111 mmol/L   CO2 22 22 - 32 mmol/L   Glucose, Bld 172 (H) 70 - 99 mg/dL    Comment: Glucose reference range applies only to samples taken after fasting for at least 8 hours.   BUN 44 (H) 8 - 23 mg/dL   Creatinine, Ser 6.21 (H) 0.61 - 1.24 mg/dL   Calcium 8.9 8.9 - 30.8 mg/dL   GFR, Estimated 45 (L) >60 mL/min    Comment: (NOTE) Calculated using the CKD-EPI Creatinine Equation (2021)    Anion gap 8 5 - 15    Comment: Performed at [facility], [address] [place], [place] [address]  Protime-INR     Status: Abnormal   Collection Time: 03/18/23 12:16 PM  Result Value Ref Range   Prothrombin Time 35.3 (H) 11.4 - 15.2 seconds   INR 3.5 (H) 0.8 - 1.2    Comment: (NOTE) INR goal varies based on device and disease states. Performed at [facility], [address], [place], [place] [address]    CT Hip Right Wo Contrast  Result Date: 03/18/2023 CLINICAL DATA:  Worsening right hip pain.  No injury. EXAM: CT OF THE RIGHT HIP WITHOUT CONTRAST TECHNIQUE: Multidetector CT imaging of the right hip was performed according to the standard protocol. Multiplanar CT image reconstructions were also generated. RADIATION DOSE REDUCTION: This exam was performed according to the departmental dose-optimization program which includes automated exposure control, adjustment  of the mA and/or kV according to patient size and/or use of iterative reconstruction technique. COMPARISON:  Right hip x-rays from same day. CT abdomen pelvis dated May 06, 2021. FINDINGS: Bones/Joint/Cartilage No fracture or dislocation. Mild right hip joint space narrowing with small marginal osteophytes. No joint effusion. Ligaments Ligaments are suboptimally evaluated by CT. Muscles and Tendons Grossly intact. Soft tissue 5.5 x 7.8 x 11.6 cm mixed hyper- and hypodense lesion in the right gluteus medius muscle (series 3, image 26). IMPRESSION: 1. 11.6 cm mixed density lesion in the right gluteus medius muscle, concerning for intramuscular hematoma given  Coumadin use. Underlying soft tissue neoplasm is not excluded. Correlate with clinical history of symptom onset and consider follow-up MRI of the right hip with and without contrast in 3 months to ensure resolution. 2. No acute osseous abnormality. 3. Mild right hip osteoarthritis. Electronically Signed   By: Obie Dredge M.D.   On: 03/18/2023 12:05   DG Hip Unilat W or Wo Pelvis 2-3 Views Right  Result Date: 03/18/2023 CLINICAL DATA:  Right hip pain EXAM: DG HIP (WITH OR WITHOUT PELVIS) 2-3V RIGHT COMPARISON:  CT abdomen pelvis 05/06/2021 FINDINGS: No signs of acute fracture or dislocation. No evidence for pelvic diastasis. Mild bilateral and symmetric hip osteoarthritis. Signs of lumbar scoliosis and degenerative disc disease noted. IMPRESSION: 1. No acute findings. 2. Mild bilateral hip osteoarthritis. Electronically Signed   By: Signa Kell M.D.   On: 03/18/2023 10:09    Pending Labs Unresulted Labs (From admission, onward)     Start     Ordered   03/19/23 0500  Basic metabolic panel  Tomorrow morning,   R        03/18/23 1342   03/19/23 0500  CBC  Tomorrow morning,   R        03/18/23 1342            Vitals/Pain Today's Vitals   03/18/23 1216 03/18/23 1230 03/18/23 1242 03/18/23 1300  BP:  (!) 158/72  (!) 149/76  Pulse:  65  60   Resp:  16  16  Temp:   97.8 F (36.6 C)   TempSrc:   Oral   SpO2:  97%  97%  PainSc: 7        Isolation Precautions No active isolations  Medications Medications  HYDROcodone-acetaminophen (NORCO/VICODIN) 5-325 MG per tablet 1 tablet (has no administration in time range)  atorvastatin (LIPITOR) tablet 20 mg (has no administration in time range)  carvedilol (COREG) tablet 6.25 mg (has no administration in time range)  niacin (NIASPAN) CR tablet 1,000 mg (has no administration in time range)  insulin aspart (novoLOG) injection 0-15 Units (has no administration in time range)  insulin aspart (novoLOG) injection 0-5 Units (has no administration in time range)  acetaminophen (TYLENOL) tablet 650 mg (has no administration in time range)    Or  acetaminophen (TYLENOL) suppository 650 mg (has no administration in time range)  traZODone (DESYREL) tablet 25 mg (has no administration in time range)  ondansetron (ZOFRAN) tablet 4 mg (has no administration in time range)    Or  ondansetron (ZOFRAN) injection 4 mg (has no administration in time range)  albuterol (PROVENTIL) (2.5 MG/3ML) 0.083% nebulizer solution 2.5 mg (has no administration in time range)  metoprolol tartrate (LOPRESSOR) injection 5 mg (has no administration in time range)  fentaNYL (SUBLIMAZE) injection 50 mcg (50 mcg Intramuscular Given 03/18/23 0928)  HYDROmorphone (DILAUDID) injection 1 mg (1 mg Intravenous Given 03/18/23 1155)    Mobility walks     Focused Assessments    R Recommendations: See Admitting Provider Note  Report given to:   Additional Notes:

## 2023-03-18 NOTE — ED Triage Notes (Addendum)
Pt arrived via EMS, saw PCP last week for right hip pain, given him pain meds and prednisone. Pain worsening, unable to bear weight. No trauma to area.

## 2023-03-18 NOTE — H&P (Signed)
History and Physical  Andrew [name] ZOX:[id number] DOB: [birth year] DOA: 03/18/2023  PCP: Pincus Sanes, MD   Chief Complaint: R hip pain  HPI: Andrew [name] is a 89 y.o. male with medical history significant for hypertension, hyperlipidemia, history of DVT several years ago on Coumadin, complete heart block with [facility] Jude pacemaker, non-insulin-dependent type 2 diabetes comes in complaining for 1 week of gradually worsening right hip pain, found to have right gluteal intramuscular hematoma.  He has been taking Coumadin for the last several years due to history of left lower extremity DVT several years ago according to the patient.  He has never had another blood clot other than that.  He started having discomfort in his right hip about 1 week ago, but has been able to ambulate, thought that he just had some hip soreness.  He actually saw his PCP 3 days ago and was given a course of oral steroids.  No other new medications, Coumadin dose has not been adjusted recently.  In the last 24 hours, he has had progressive pain in the right hip, and so came to the hospital for evaluation.  Denies trauma, though yesterday he was riding his tractor doing some yard work, and it is certainly a bumpy ride.  ED Course: He was evaluated in the emergency department, hemodynamically stable with unremarkable vital signs.  Lab work shows WBC 13, hemoglobin 10, creatinine 1.51 (baseline 1.4) INR 3.5 (last 3.0 on 1/5).  CT as noted below shows 11 cm right hip/buttock intramuscular hematoma.  Review of Systems: Please see HPI for pertinent positives and negatives. A complete 10 system review of systems are otherwise negative.  Past Medical History:  Diagnosis Date   Colonic polyp 2011 last colo   colo q 32yr - [facility]    Coronary artery disease 02/2009   severe left main and three-vessel, CABG   DEGENERATIVE JOINT DISEASE    Diverticulosis of colon    DIVERTICULOSIS, COLON    History of kidney stones     HYPERLIPIDEMIA-MIXED    HYPERTENSION, BENIGN    LBP (low back pain)    s/p decompression 8/14; ESI - [place]; RFA x 3 early 2015   Pacemaker    a. 05/2017: St. Jude (serial number [id number]) pacemaker   Symptomatic bradycardia    a. s/p St. Jude (serial number [id number]) pacemaker 06/16/17   Type II or unspecified type diabetes mellitus without mention of complication, not stated as uncontrolled    VITAMIN D DEFICIENCY    Past Surgical History:  Procedure Laterality Date   CARPAL TUNNEL RELEASE Right    CATARACT EXTRACTION, BILATERAL     R 07/07/13, L 07/21/13   CORONARY ARTERY BYPASS GRAFT  03/17/09   x 4   CYSTOSCOPY WITH STENT PLACEMENT Left 05/10/2021   Procedure: CYSTOSCOPY WITH LEFT  STENT PLACEMENT,LEFT RETROGRADE PYELOGRAM;  Surgeon: Crist Fat, MD;  Location: [facility];  Service: Urology;  Laterality: Left;   CYSTOSCOPY/URETEROSCOPY/HOLMIUM LASER/STENT PLACEMENT Left 06/03/2021   Procedure: CYSTOSCOPY, LEFT RETROGRADE, URETEROSCOPY,HOLMIUM LASER,STENT EXCHANGE;  Surgeon: Crist Fat, MD;  Location: [facility];  Service: Urology;  Laterality: Left;   INGUINAL HERNIA REPAIR  1980   KNEE ARTHROSCOPY  2007   right   LUMBAR EPIDURAL INJECTION  2013   Has as needed.   LUMBAR LAMINECTOMY/DECOMPRESSION MICRODISCECTOMY N/A 06/19/2013   Procedure: LUMBAR LAMINECTOMY/DECOMPRESSION MICRODISCECTOMY;  Surgeon: Emilee Hero, MD;  Location: [facility] OR;  Service: Orthopedics;  Laterality: N/A;  Lumbar 4-5 decompression  PACEMAKER IMPLANT N/A 06/16/2017   Procedure: Pacemaker Implant;  Surgeon: Marinus Maw, MD;  Location: [facility] INVASIVE CV LAB;  Service: Cardiovascular;  Laterality: N/A;   TRIGGER FINGER RELEASE Right     Social History:  reports that he has never smoked. He has never used smokeless tobacco. He reports current alcohol use. He reports that he does not use drugs.   Allergies  Allergen Reactions   Penicillins Other (See Comments) and Anaphylaxis    Taste buds     Family History  Problem Relation Age of Onset   Heart failure Mother        died age 80   Heart failure Father        died age 62   Diabetes Other    Heart disease Neg Hx      Prior to Admission medications   Medication Sig Start Date End Date Taking? Authorizing Provider  atorvastatin (LIPITOR) 20 MG tablet TAKE 1 TABLET DAILY AT     6:00PM 12/05/22   Pincus Sanes, MD  Acetaminophen (TYLENOL 8 HOUR PO) Take 1,000 mg by mouth 3 (three) times daily.    [provider]  aspirin EC 81 MG tablet Take 81 mg by mouth daily.    [provider]  carvedilol (COREG) 6.25 MG tablet Take 1 tablet (6.25 mg total) by mouth 2 (two) times daily with a meal. 09/04/22   Gollan, Tollie Pizza, MD  cholecalciferol (VITAMIN D) 1000 UNITS tablet Take 1,000 Units by mouth daily.    [provider]  HYDROcodone-acetaminophen (NORCO/VICODIN) 5-325 MG tablet Take 1 tablet by mouth every 6 (six) hours as needed for moderate pain.    [provider]  metFORMIN (GLUCOPHAGE-XR) 500 MG 24 hr tablet TAKE 1 TABLET TWICE A DAY 02/14/22   Pincus Sanes, MD  Multiple Vitamin (MULTIVITAMIN WITH MINERALS) TABS tablet Take 1 tablet by mouth daily.    [provider]  niacin (NIASPAN) 1000 MG CR tablet TAKE 1 TABLET AT BEDTIME 03/08/22   Burns, Bobette Mo, MD  TURMERIC CURCUMIN PO Take 2,000 mg by mouth in the morning and at bedtime.    [provider]  warfarin (JANTOVEN) 5 MG tablet TAKE 1-2 TABLETS DAILY OR AS DIRECTED BY THE COUMADIN CLINIC 06/02/22   Antonieta Iba, MD    Physical Exam: BP (!) 149/76   Pulse 60   Temp 97.8 F (36.6 C) (Oral)   Resp 16   SpO2 97%   General:  Alert, oriented, calm, in no acute distress, looks very comfortable and younger than his stated age Eyes: EOMI, clear conjuctivae, white sclerea Neck: supple, no masses, trachea mildline  Cardiovascular: RRR, no murmurs or rubs, no peripheral edema  Respiratory: clear to auscultation  bilaterally, no wheezes, no crackles  Abdomen: soft, nontender, nondistended, normal bowel tones heard  Skin: dry, no rashes  Musculoskeletal: Palpable tender soft nonerythematous 9 to 10 cm mass on the right lateral/posterior hip.  No overlying skin changes or bruising. Psychiatric: appropriate affect, normal speech  Neurologic: extraocular muscles intact, clear speech, moving all extremities with intact sensorium          Labs on Admission:  Basic Metabolic Panel: Recent Labs  Lab 03/18/23 1045  NA 137  K 4.3  CL 107  CO2 22  GLUCOSE 172*  BUN 44*  CREATININE 1.51*  CALCIUM 8.9   Liver Function Tests: No results for input(s): "AST", "ALT", "ALKPHOS", "BILITOT", "PROT", "ALBUMIN" in the last 168 hours. No  results for input(s): "LIPASE", "AMYLASE" in the last 168 hours. No results for input(s): "AMMONIA" in the last 168 hours. CBC: Recent Labs  Lab 03/18/23 1045  WBC 13.5*  HGB 10.2*  HCT 31.3*  MCV 107.2*  PLT 190   Cardiac Enzymes: No results for input(s): "CKTOTAL", "CKMB", "CKMBINDEX", "TROPONINI" in the last 168 hours.  BNP (last 3 results) No results for input(s): "BNP" in the last 8760 hours.  ProBNP (last 3 results) No results for input(s): "PROBNP" in the last 8760 hours.  CBG: No results for input(s): "GLUCAP" in the last 168 hours.  Radiological Exams on Admission: CT Hip Right Wo Contrast  Result Date: 03/18/2023 CLINICAL DATA:  Worsening right hip pain.  No injury. EXAM: CT OF THE RIGHT HIP WITHOUT CONTRAST TECHNIQUE: Multidetector CT imaging of the right hip was performed according to the standard protocol. Multiplanar CT image reconstructions were also generated. RADIATION DOSE REDUCTION: This exam was performed according to the departmental dose-optimization program which includes automated exposure control, adjustment of the mA and/or kV according to patient size and/or use of iterative reconstruction technique. COMPARISON:  Right hip x-rays from  same day. CT abdomen pelvis dated May 06, 2021. FINDINGS: Bones/Joint/Cartilage No fracture or dislocation. Mild right hip joint space narrowing with small marginal osteophytes. No joint effusion. Ligaments Ligaments are suboptimally evaluated by CT. Muscles and Tendons Grossly intact. Soft tissue 5.5 x 7.8 x 11.6 cm mixed hyper- and hypodense lesion in the right gluteus medius muscle (series 3, image 26). IMPRESSION: 1. 11.6 cm mixed density lesion in the right gluteus medius muscle, concerning for intramuscular hematoma given Coumadin use. Underlying soft tissue neoplasm is not excluded. Correlate with clinical history of symptom onset and consider follow-up MRI of the right hip with and without contrast in 3 months to ensure resolution. 2. No acute osseous abnormality. 3. Mild right hip osteoarthritis. Electronically Signed   By: Obie Dredge M.D.   On: 03/18/2023 12:05   DG Hip Unilat W or Wo Pelvis 2-3 Views Right  Result Date: 03/18/2023 CLINICAL DATA:  Right hip pain EXAM: DG HIP (WITH OR WITHOUT PELVIS) 2-3V RIGHT COMPARISON:  CT abdomen pelvis 05/06/2021 FINDINGS: No signs of acute fracture or dislocation. No evidence for pelvic diastasis. Mild bilateral and symmetric hip osteoarthritis. Signs of lumbar scoliosis and degenerative disc disease noted. IMPRESSION: 1. No acute findings. 2. Mild bilateral hip osteoarthritis. Electronically Signed   By: Signa Kell M.D.   On: 03/18/2023 10:09    Assessment/Plan This is a pleasant 89 year old active gentleman with a history of non-insulin-dependent type 2 diabetes, hypertension, hyperlipidemia, CKD on Coumadin due to history of DVT several years ago being admitted to the hospital with large right hip hematoma in the setting of supratherapeutic INR.  Hematoma-patient is hemodynamically stable, without significant anemia.  Baseline hemoglobin around 11.  Given location, should tamponade and stop any active bleeding. -Observation admission -Pain and  nausea control as needed -Avoid further blood thinners -Given history of DVT several years ago, okay to reverse anticoagulation; give 5 mg IV vitamin K now -Hold Coumadin for now going forward, check INR daily  History of DVT, on Coumadin, presenting with supratherapeutic INR-Per the patient, he had a left lower extremity DVT several years ago and has been on Coumadin since that time.  It is the only blood clot he has had.  Question whether he needs to continue full anticoagulation with Coumadin going forward.   Type 2 diabetes mellitus with neurological manifestations, controlled (HCC) -Diabetic  diet when eating -Hold home metformin in the setting of acute renal failure -Sliding scale insulin in the meantime  Hypertension-continue home Coreg  Dyslipidemia, goal LDL below 70-continue home statin  AKI superimposed on CKD (chronic kidney disease) stage 3, GFR 30-59 ml/min (HCC) -Avoid nephrotoxins -Will hydrate gently with normal saline -Recheck renal function in the morning  Leukocytosis-likely reactive from intramuscular bleed, currently no evidence of infection    Pulmonary fibrosis (HCC)-noted, stable pulmonary status  DVT prophylaxis: SCDs    Code Status: Full Code  Consults called: None  Admission status: Observation  Time spent: 45 minutes  Phil Corti Sharlette Dense MD [facility] Pager [phone]  If 7PM-7AM, please contact night-coverage [email or web address] Password [facility]  03/18/2023, 1:47 PM

## 2023-03-19 DIAGNOSIS — T148XXA Other injury of unspecified body region, initial encounter: Secondary | ICD-10-CM | POA: Diagnosis not present

## 2023-03-19 LAB — BASIC METABOLIC PANEL
Anion gap: 7 (ref 5–15)
BUN: 37 mg/dL — ABNORMAL HIGH (ref 8–23)
CO2: 23 mmol/L (ref 22–32)
Calcium: 8.8 mg/dL — ABNORMAL LOW (ref 8.9–10.3)
Chloride: 109 mmol/L (ref 98–111)
Creatinine, Ser: 1.3 mg/dL — ABNORMAL HIGH (ref 0.61–1.24)
GFR, Estimated: 54 mL/min — ABNORMAL LOW (ref >60)
Glucose, Bld: 117 mg/dL — ABNORMAL HIGH (ref 70–99)
Potassium: 4.3 mmol/L (ref 3.5–5.1)
Sodium: 139 mmol/L (ref 135–145)

## 2023-03-19 LAB — GLUCOSE, CAPILLARY
Glucose-Capillary: 97 mg/dL (ref 70–99)
Glucose-Capillary: 122 mg/dL — ABNORMAL HIGH (ref 70–99)

## 2023-03-19 LAB — PROTIME-INR
INR: 1.3 — ABNORMAL HIGH (ref 0.8–1.2)
Prothrombin Time: 16 seconds — ABNORMAL HIGH (ref 11.4–15.2)

## 2023-03-19 LAB — CBC
HCT: 28.4 % — ABNORMAL LOW (ref 39.0–52.0)
Hemoglobin: 9 g/dL — ABNORMAL LOW (ref 13.0–17.0)
MCH: 35.4 pg — ABNORMAL HIGH (ref 26.0–34.0)
MCHC: 31.7 g/dL (ref 30.0–36.0)
MCV: 111.8 fL — ABNORMAL HIGH (ref 80.0–100.0)
Platelets: 161 10*3/uL (ref 150–400)
RBC: 2.54 MIL/uL — ABNORMAL LOW (ref 4.22–5.81)
RDW: 14.7 % (ref 11.5–15.5)
WBC: 10.7 10*3/uL — ABNORMAL HIGH (ref 4.0–10.5)
nRBC: 0 % (ref 0.0–0.2)

## 2023-03-19 NOTE — Evaluation (Signed)
Physical Therapy Evaluation Patient Details Name: Andrew [name] MRN: [id number] DOB: Oct 09, 1934 Today's Date: 03/19/2023  History of Present Illness  89 yo male presented to ED on 03/18/2023 due to R hip pain over the past week impacting pt ability to ambulate. Pt presented to PCP whom placed pt on PO steroids and no apparent relief. Imaging negative for acute findings with CT of R hip revealing mixed density lesion in the right gluteus medius muscle, concerning for intramuscular hematoma given coumadin use. Pt has PMH including but not limited to: DJD, diverticulosis, HDL, HTN, LBP s/p laminectomy, DM II, pacemaker, CABG x 4 and LLE DVT.  Clinical Impression    Pt admitted with above diagnosis.  Pt currently with functional limitations due to the deficits listed below (see PT Problem List). Pt in bed when PT arrived. Pt agreeable to therapy intervention pt states pain medication administered ~45 mins prior to PT arrival. No pain at rest and 5/10 R LE pain with mobility. Pt denies dizziness or SOB. Pt required increased time and S for supine to sit with cues. Pt required min guard and cues for STS from EOB to RW, gait tasks with RW min guard and progressed to S 150 feet with slight trunk flexion and step almost through pattern with pt limiting R LE WB in stance phase. Pt is motivated to return home and indicates that there was no way he could have done this yesterday due to the pain levels. Pt declines further therapy services at time of d/c. Pt left seated in recliner all needs in place. Pt will benefit from acute skilled PT to increase their independence and safety with mobility to allow discharge.        Recommendations for follow up therapy are one component of a multi-disciplinary discharge planning process, led by the attending physician.  Recommendations may be updated based on patient status, additional functional criteria and insurance authorization.  Follow Up Recommendations        Assistance Recommended at Discharge Set up Supervision/Assistance  Patient can return home with the following  A little help with walking and/or transfers;A little help with bathing/dressing/bathroom;Assistance with cooking/housework;Assist for transportation    Equipment Recommendations None recommended by PT (pt reports RW in home setting)  Recommendations for Other Services       Functional Status Assessment Patient has had a recent decline in their functional status and demonstrates the ability to make significant improvements in function in a reasonable and predictable amount of time.     Precautions / Restrictions Precautions Precautions: Fall Restrictions Weight Bearing Restrictions: No      Mobility  Bed Mobility Overal bed mobility: Needs Assistance Bed Mobility: Supine to Sit     Supine to sit: Supervision, HOB elevated     General bed mobility comments: increased time, cues and use of bed rail    Transfers Overall transfer level: Needs assistance Equipment used: Rolling walker (2 wheels) Transfers: Sit to/from Stand Sit to Stand: Min guard           General transfer comment: cues for proper UE placement    Ambulation/Gait Ambulation/Gait assistance: Min guard Gait Distance (Feet): 150 Feet Assistive device: Rolling walker (2 wheels) Gait Pattern/deviations: Step-to pattern, Antalgic Gait velocity: decreased     General Gait Details: step almost through pattern slight trunk flexion  Stairs            Wheelchair Mobility    Modified Rankin (Stroke Patients Only)  Balance Overall balance assessment: Mild deficits observed, not formally tested                                           Pertinent Vitals/Pain Pain Assessment Pain Assessment: 0-10 Pain Score: 5  Pain Location: R  hip and buttock Pain Descriptors / Indicators: Aching, Constant, Discomfort, Guarding Pain Intervention(s): Limited activity within  patient's tolerance, Monitored during session, Premedicated before session, Repositioned    Home Living Family/patient expects to be discharged to:: Private residence Living Arrangements: Spouse/significant other;Children Available Help at Discharge: Family Type of Home: House Home Access: Stairs to enter Entrance Stairs-Rails: Right;Left;Can reach both Secretary/administrator of Steps: 5 Alternate Level Stairs-Number of Steps: 15 Home Layout: Two level Home Equipment: Agricultural consultant (2 wheels)      Prior Function Prior Level of Function : Independent/Modified Independent             Mobility Comments: IND with all ADLs, self care tasks, IADLs, driving and gardening ADLs Comments: pt reprots living with wife whom requries some assist and son     Hand Dominance        Extremity/Trunk Assessment        Lower Extremity Assessment Lower Extremity Assessment: Generalized weakness (guarding R LE)    Cervical / Trunk Assessment Cervical / Trunk Assessment: Normal  Communication   Communication: HOH (no hearing aids at hospital)  Cognition Arousal/Alertness: Awake/alert Behavior During Therapy: WFL for tasks assessed/performed Overall Cognitive Status: Within Functional Limits for tasks assessed                                          General Comments      Exercises     Assessment/Plan    PT Assessment Patient needs continued PT services  PT Problem List Decreased strength;Decreased activity tolerance;Decreased balance;Decreased mobility;Decreased coordination;Pain       PT Treatment Interventions DME instruction;Gait training;Stair training;Functional mobility training;Therapeutic activities;Therapeutic exercise;Balance training;Neuromuscular re-education;Patient/family education    PT Goals (Current goals can be found in the Care Plan section)  Acute Rehab PT Goals Patient Stated Goal: to go home and decreased R LE pain PT Goal  Formulation: With patient Time For Goal Achievement: 04/02/23 Potential to Achieve Goals: Good    Frequency Min 1X/week     Co-evaluation               AM-PAC PT "6 Clicks" Mobility  Outcome Measure Help needed turning from your back to your side while in a flat bed without using bedrails?: None Help needed moving from lying on your back to sitting on the side of a flat bed without using bedrails?: A Little Help needed moving to and from a bed to a chair (including a wheelchair)?: A Little Help needed standing up from a chair using your arms (e.g., wheelchair or bedside chair)?: A Little Help needed to walk in hospital room?: A Little Help needed climbing 3-5 steps with a railing? : A Lot 6 Click Score: 18    End of Session Equipment Utilized During Treatment: Gait belt Activity Tolerance: Patient tolerated treatment well (pt indicated no pain at rest and 5/10 with mobility pain medication ~ 45 mins prior to PT arrival) Patient left: in chair;with call bell/phone within reach (pt ed on use of  call bell and awating staff S and A prior to returning to bed, pt demonstrated verbal understanding) Nurse Communication: Mobility status PT Visit Diagnosis: Unsteadiness on feet (R26.81);Muscle weakness (generalized) (M62.81);Pain;Difficulty in walking, not elsewhere classified (R26.2) Pain - Right/Left: Right Pain - part of body: Leg;Hip    Time: 0927-0950 PT Time Calculation (min) (ACUTE ONLY): 23 min   Charges:   PT Evaluation $PT Eval Low Complexity: 1 Low PT Treatments $Gait Training: 8-22 mins        Johnny Bridge, PT Acute Rehab   Jacqualyn Posey 03/19/2023, 11:18 AM

## 2023-03-19 NOTE — Progress Notes (Signed)
  Transition of Care [facility]) Screening Note   Patient Details  Name: Andrew [name] Date of Birth: [birth year]   Transition of Care [facility]) CM/SW Contact:    Otelia Santee, LCSW Phone Number: 03/19/2023, 11:27 AM    Transition of Care Department [facility]) has reviewed patient and no TOC needs have been identified at this time. We will continue to monitor patient advancement through interdisciplinary progression rounds. If new patient transition needs arise, please place a TOC consult.

## 2023-03-19 NOTE — Discharge Summary (Signed)
Physician Discharge Summary   Patient: Andrew [name] MRN: [id number] DOB: Oct 16, 1934  Admit date:     03/18/2023  Discharge date: 03/19/23  Discharge Physician: Tyrone Nine   PCP: Pincus Sanes, MD   Recommendations at discharge:  Holding coumadin pending repeat clinical evaluation due to intramuscular hematoma. Recommend repeat lower extremity venous U/S to confirm persistence of chronic DVT, and monitoring CBC at follow up to monitor mild asymptomatic ABLA. Hgb 9.0g/dl on day of discharge.  Discharge Diagnoses: Principal Problem:   Hematoma Active Problems:   Type 2 diabetes mellitus with neurological manifestations, controlled (HCC)   Dyslipidemia, goal LDL below 70   DVT (deep venous thrombosis) (HCC)   CKD (chronic kidney disease) stage 3, GFR 30-59 ml/min (HCC)   Pulmonary fibrosis (HCC)   Supratherapeutic INR  Hospital Course: HPI: Andrew [name] is a 89 y.o. male with medical history significant for hypertension, hyperlipidemia, history of DVT several years ago on Coumadin, complete heart block with [facility] Jude pacemaker, non-insulin-dependent type 2 diabetes comes in complaining for 1 week of gradually worsening right hip pain, found to have right gluteal intramuscular hematoma.  He has been taking Coumadin for the last several years due to history of left lower extremity DVT several years ago according to the patient.  He has never had another blood clot other than that.  He started having discomfort in his right hip about 1 week ago, but has been able to ambulate, thought that he just had some hip soreness.  He actually saw his PCP 3 days ago and was given a course of oral steroids.  No other new medications, Coumadin dose has not been adjusted recently.  In the last 24 hours, he has had progressive pain in the right hip, and so came to the hospital for evaluation.  Denies trauma, though yesterday he was riding his tractor doing some yard work, and it is certainly a  bumpy ride.   ED Course: He was evaluated in the emergency department, hemodynamically stable with unremarkable vital signs.  Lab work shows WBC 13, hemoglobin 10, creatinine 1.51 (baseline 1.4) INR 3.5 (last 3.0 on 1/5).  CT as noted below shows 11 cm right hip/buttock intramuscular hematoma.  Hospital Course: Pain significantly improved and he is ambulating with mild pain level. No further PT evaluation or follow up is recommended. Coumadin is held and an ultrasound of the leg to confirm persistence of chronic DVT was ordered. Unfortunately, ultrasound services are not available on Memorial day holiday. Due this and to mild drop in hgb (10.2 > 9.0), option to remain inpatient for monitoring was given, but patient wishes to go home and has reliable follow up. Return precautions advised. He will hold coumadin pending repeat evaluation at PCP.   Assessment and Plan: This is a pleasant 89 year old active gentleman with a history of non-insulin-dependent type 2 diabetes, hypertension, hyperlipidemia, CKD on Coumadin due to history of DVT several years ago being admitted to the hospital with large right hip hematoma in the setting of supratherapeutic INR.   Hematoma: Pain improved, hematoma on exam is not enlarging, remains hemodynamically stable. Hgb 9g/dl. Pt opts to discharge in lieu of suggestion that we continue monitoring CBC. Ok holding anticoagulation. INR down after vitamin K to 1.3.     History of chronic DVT, on Coumadin, presenting with supratherapeutic INR-Per the patient, he had a left lower extremity DVT several years ago and has been on Coumadin since that time. Records indicate it is a  chronic clot. He's had no clinical changes in the leg of late. Perhaps steroids caused increased INR and worsened bleeding risk, though the same pain that is attributed to the hematoma is the reason he sought orthopedic care in the first place. This remains unclear.  - Hold coumadin for now - Repeat venous  U/S (unavailable on day of discharge, pt opts to defer to outpatient setting)   Type 2 diabetes mellitus with neurological manifestations, controlled (HCC) - Continue home medications, consider discontinuation of metformin pending renal function baseline.    Hypertension-continue home Coreg   Dyslipidemia, goal LDL below 70-continue home statin   Mild AKI on stage IIIa CKD: Improved.  - Monitor at follow up, avoid nephrotoxins, consider alternative agent to metformin.   Leukocytosis-likely reactive from intramuscular bleed, currently no evidence of infection. Improving without antimicrobial Tx.      Pulmonary fibrosis (HCC)-noted, stable pulmonary status   Consultants: None Procedures performed: None  Disposition: Home Diet recommendation: Heart healthy, carb-modified DISCHARGE MEDICATION: Allergies as of 03/19/2023       Reactions   Penicillins Other (See Comments), Anaphylaxis   Taste buds        Medication List     STOP taking these medications    predniSONE 10 MG tablet Commonly known as: DELTASONE   warfarin 5 MG tablet Commonly known as: Jantoven       TAKE these medications    aspirin EC 81 MG tablet Take 81 mg by mouth daily.   atorvastatin 20 MG tablet Commonly known as: LIPITOR TAKE 1 TABLET DAILY AT     6:00PM What changed: See the new instructions.   carvedilol 6.25 MG tablet Commonly known as: COREG Take 1 tablet (6.25 mg total) by mouth 2 (two) times daily with a meal.   cholecalciferol 1000 units tablet Commonly known as: VITAMIN D Take 1,000 Units by mouth daily.   HYDROcodone-acetaminophen 5-325 MG tablet Commonly known as: NORCO/VICODIN Take 1 tablet by mouth every 6 (six) hours as needed for moderate pain.   metFORMIN 500 MG 24 hr tablet Commonly known as: GLUCOPHAGE-XR TAKE 1 TABLET TWICE A DAY What changed: when to take this   multivitamin with minerals Tabs tablet Take 1 tablet by mouth daily.   niacin 1000 MG CR  tablet Commonly known as: NIASPAN TAKE 1 TABLET AT BEDTIME   TURMERIC CURCUMIN PO Take 2,000 mg by mouth in the morning and at bedtime.   TYLENOL 8 HOUR PO Take 1,000 mg by mouth 3 (three) times daily.        Follow-up Information     Pincus Sanes, MD Follow up.   Specialty: Internal Medicine Contact information: [address] [place] [address] [phone]         Loreta Ave, MD Follow up.   Specialty: Orthopedic Surgery               Discharge Exam: BP 128/67 (BP Location: Left Arm)   Pulse 67   Temp 98.2 F (36.8 C) (Oral)   Resp 19   SpO2 99%   No distress, well-appearing seen ambulating in hall.  Clear, nonlabored RRR, no MRG Trace LLE edema nontender without pitting. Palpable subcutaneous/intramuscular collection that is more hard than fluctuant in right buttock. Not tender, no erythema. Full ROM at hip.   Condition at discharge: stable  The results of significant diagnostics from this hospitalization (including imaging, microbiology, ancillary and laboratory) are listed below for reference.   Imaging Studies: CT Hip  Right Wo Contrast  Result Date: 03/18/2023 CLINICAL DATA:  Worsening right hip pain.  No injury. EXAM: CT OF THE RIGHT HIP WITHOUT CONTRAST TECHNIQUE: Multidetector CT imaging of the right hip was performed according to the standard protocol. Multiplanar CT image reconstructions were also generated. RADIATION DOSE REDUCTION: This exam was performed according to the departmental dose-optimization program which includes automated exposure control, adjustment of the mA and/or kV according to patient size and/or use of iterative reconstruction technique. COMPARISON:  Right hip x-rays from same day. CT abdomen pelvis dated May 06, 2021. FINDINGS: Bones/Joint/Cartilage No fracture or dislocation. Mild right hip joint space narrowing with small marginal osteophytes. No joint effusion. Ligaments Ligaments are suboptimally evaluated  by CT. Muscles and Tendons Grossly intact. Soft tissue 5.5 x 7.8 x 11.6 cm mixed hyper- and hypodense lesion in the right gluteus medius muscle (series 3, image 26). IMPRESSION: 1. 11.6 cm mixed density lesion in the right gluteus medius muscle, concerning for intramuscular hematoma given Coumadin use. Underlying soft tissue neoplasm is not excluded. Correlate with clinical history of symptom onset and consider follow-up MRI of the right hip with and without contrast in 3 months to ensure resolution. 2. No acute osseous abnormality. 3. Mild right hip osteoarthritis. Electronically Signed   By: Obie Dredge M.D.   On: 03/18/2023 12:05   DG Hip Unilat W or Wo Pelvis 2-3 Views Right  Result Date: 03/18/2023 CLINICAL DATA:  Right hip pain EXAM: DG HIP (WITH OR WITHOUT PELVIS) 2-3V RIGHT COMPARISON:  CT abdomen pelvis 05/06/2021 FINDINGS: No signs of acute fracture or dislocation. No evidence for pelvic diastasis. Mild bilateral and symmetric hip osteoarthritis. Signs of lumbar scoliosis and degenerative disc disease noted. IMPRESSION: 1. No acute findings. 2. Mild bilateral hip osteoarthritis. Electronically Signed   By: Signa Kell M.D.   On: 03/18/2023 10:09   CUP PACEART REMOTE DEVICE CHECK  Result Date: 03/14/2023 Scheduled remote reviewed. Normal device function.  1 AMS, 6sec in duraiton Next remote 91 days. LA, CVRS   Microbiology: Results for orders placed or performed during the hospital encounter of 06/15/17  MRSA PCR Screening     Status: None   Collection Time: 06/15/17  2:54 PM   Specimen: Nasal Mucosa; Nasopharyngeal  Result Value Ref Range Status   MRSA by PCR NEGATIVE NEGATIVE Final    Comment:        The GeneXpert MRSA Assay (FDA approved for NASAL specimens only), is one component of a comprehensive MRSA colonization surveillance program. It is not intended to diagnose MRSA infection nor to guide or monitor treatment for MRSA infections.     Labs: CBC: Recent Labs   Lab 03/18/23 1045 03/19/23 0440  WBC 13.5* 10.7*  HGB 10.2* 9.0*  HCT 31.3* 28.4*  MCV 107.2* 111.8*  PLT 190 161   Basic Metabolic Panel: Recent Labs  Lab 03/18/23 1045 03/19/23 0440  NA 137 139  K 4.3 4.3  CL 107 109  CO2 22 23  GLUCOSE 172* 117*  BUN 44* 37*  CREATININE 1.51* 1.30*  CALCIUM 8.9 8.8*   Liver Function Tests: No results for input(s): "AST", "ALT", "ALKPHOS", "BILITOT", "PROT", "ALBUMIN" in the last 168 hours. CBG: Recent Labs  Lab 03/18/23 1636 03/18/23 2154 03/19/23 0740  GLUCAP 148* 163* 97    Discharge time spent: greater than 30 minutes.  Signed: Tyrone Nine, MD [facility] 03/19/2023

## 2023-03-20 ENCOUNTER — Telehealth: Payer: Self-pay

## 2023-03-20 NOTE — Transitions of Care (Post Inpatient/ED Visit) (Signed)
03/20/2023  Name: Andrew [name] MRN: [id number] DOB: 1934-02-22  Today's TOC FU Call Status: Today's TOC FU Call Status:: Successful TOC FU Call Competed TOC FU Call Complete Date: 03/20/23  Transition Care Management Follow-up Telephone Call Date of Discharge: 03/19/23 Discharge Facility: [facility]) Type of Discharge: Inpatient Admission Primary Inpatient Discharge Diagnosis:: Pain in rt hip How have you been since you were released from the hospital?: Better Any questions or concerns?: No  Items Reviewed: Did you receive and understand the discharge instructions provided?: Yes Medications obtained,verified, and reconciled?: Yes (Medications Reviewed) Any new allergies since your discharge?: No Dietary orders reviewed?: NA Do you have support at home?: Yes People in Home: spouse  Medications Reviewed Today: Medications Reviewed Today     Reviewed by Tillie Rung, LPN (Licensed Practical Nurse) on 03/20/23 at 1039  Med List Status: <None>   Medication Order Taking? Sig Documenting Provider Last Dose Status Informant  Acetaminophen (TYLENOL 8 HOUR PO) [id number] No Take 1,000 mg by mouth 3 (three) times daily. [provider] 03/17/2023 Active Multiple Informants           Med Note (SATTERFIELD, Genoveva Ill   Sun Mar 18, 2023 10:15 PM) Patient verified dose is correct and he is taking every day per patient   aspirin EC 81 MG tablet [id number] No Take 81 mg by mouth daily. [provider] 03/17/2023 Active Multiple Informants  atorvastatin (LIPITOR) 20 MG tablet [id number] No TAKE 1 TABLET DAILY AT     6:00PM  Patient taking differently: Take 20 mg by mouth every evening.   Pincus Sanes, MD 03/17/2023 Active Multiple Informants  carvedilol (COREG) 6.25 MG tablet [id number] No Take 1 tablet (6.25 mg total) by mouth 2 (two) times daily with a meal. Antonieta Iba, MD 03/17/2023 1800 Active Multiple Informants  cholecalciferol (VITAMIN D) 1000 UNITS  tablet [id number] No Take 1,000 Units by mouth daily. [provider] 03/17/2023 Active Multiple Informants  HYDROcodone-acetaminophen (NORCO/VICODIN) 5-325 MG tablet [id number] No Take 1 tablet by mouth every 6 (six) hours as needed for moderate pain. [provider] 03/18/2023 Active Multiple Informants           Med Note Erin Fulling, MEGAN   Sun Mar 18, 2023  1:50 PM)    metFORMIN (GLUCOPHAGE-XR) 500 MG 24 hr tablet [id number] No TAKE 1 TABLET TWICE A DAY  Patient taking differently: Take 500 mg by mouth in the morning and at bedtime.   Pincus Sanes, MD 03/17/2023 Active Multiple Informants  Multiple Vitamin (MULTIVITAMIN WITH MINERALS) TABS tablet [id number] No Take 1 tablet by mouth daily. [provider] 03/17/2023 Active Multiple Informants  niacin (NIASPAN) 1000 MG CR tablet [id number] No TAKE 1 TABLET AT BEDTIME  Patient taking differently: Take 1,000 mg by mouth at bedtime.   Pincus Sanes, MD 03/17/2023 Active Multiple Informants  TURMERIC CURCUMIN PO [id number] No Take 2,000 mg by mouth in the morning and at bedtime. [provider] 03/17/2023 Active Multiple Informants            Home Care and Equipment/Supplies: Were Home Health Services Ordered?: NA Any new equipment or medical supplies ordered?: NA  Functional Questionnaire: Do you need assistance with bathing/showering or dressing?: No Do you need assistance with meal preparation?: No Do you need assistance with eating?: No Do you have difficulty maintaining continence: No Do you need assistance with getting out of bed/getting out of a chair/moving?: No Do you have difficulty managing or taking your  medications?: No  Follow up appointments reviewed: PCP Follow-up appointment confirmed?: Yes Date of PCP follow-up appointment?: 04/03/23 Follow-up Provider: [facility] Follow-up appointment confirmed?: NA Do you need transportation to your follow-up appointment?:  No Do you understand care options if your condition(s) worsen?: Yes-patient verbalized understanding    SIGNATURE Theresa Mulligan LPN [facility] AWV Team Direct Dial # [phone]

## 2023-03-20 NOTE — Transitions of Care (Post Inpatient/ED Visit) (Signed)
03/20/2023  Name: Andrew [name] MRN: [id number] DOB: [birth year]  Today's TOC FU Call Status:    Transition Care Management Follow-up Telephone Call Date of Discharge: 03/19/23 Discharge Facility: [facility]) Type of Discharge: Inpatient Admission Primary Inpatient Discharge Diagnosis:: Acute Kidney Failure Unspecfied How have you been since you were released from the hospital?: Better Any questions or concerns?: No  Items Reviewed: Did you receive and understand the discharge instructions provided?: Yes Medications obtained,verified, and reconciled?: Yes (Medications Reviewed) Any new allergies since your discharge?: No Dietary orders reviewed?: NA Do you have support at home?: Yes People in Home: spouse  Medications Reviewed Today: Medications Reviewed Today     Reviewed by Tillie Rung, LPN (Licensed Practical Nurse) on 03/20/23 at 1039  Med List Status: <None>   Medication Order Taking? Sig Documenting Provider Last Dose Status Informant  Acetaminophen (TYLENOL 8 HOUR PO) [id number] No Take 1,000 mg by mouth 3 (three) times daily. [provider] 03/17/2023 Active Multiple Informants           Med Note (SATTERFIELD, Genoveva Ill   Sun Mar 18, 2023 10:15 PM) Patient verified dose is correct and he is taking every day per patient   aspirin EC 81 MG tablet [id number] No Take 81 mg by mouth daily. [provider] 03/17/2023 Active Multiple Informants  atorvastatin (LIPITOR) 20 MG tablet [id number] No TAKE 1 TABLET DAILY AT     6:00PM  Patient taking differently: Take 20 mg by mouth every evening.   Pincus Sanes, MD 03/17/2023 Active Multiple Informants  carvedilol (COREG) 6.25 MG tablet [id number] No Take 1 tablet (6.25 mg total) by mouth 2 (two) times daily with a meal. Antonieta Iba, MD 03/17/2023 1800 Active Multiple Informants  cholecalciferol (VITAMIN D) 1000 UNITS tablet [id number] No Take 1,000 Units by mouth daily. [provider]  03/17/2023 Active Multiple Informants  HYDROcodone-acetaminophen (NORCO/VICODIN) 5-325 MG tablet [id number] No Take 1 tablet by mouth every 6 (six) hours as needed for moderate pain. [provider] 03/18/2023 Active Multiple Informants           Med Note Erin Fulling, MEGAN   Sun Mar 18, 2023  1:50 PM)    metFORMIN (GLUCOPHAGE-XR) 500 MG 24 hr tablet [id number] No TAKE 1 TABLET TWICE A DAY  Patient taking differently: Take 500 mg by mouth in the morning and at bedtime.   Pincus Sanes, MD 03/17/2023 Active Multiple Informants  Multiple Vitamin (MULTIVITAMIN WITH MINERALS) TABS tablet [id number] No Take 1 tablet by mouth daily. [provider] 03/17/2023 Active Multiple Informants  niacin (NIASPAN) 1000 MG CR tablet [id number] No TAKE 1 TABLET AT BEDTIME  Patient taking differently: Take 1,000 mg by mouth at bedtime.   Pincus Sanes, MD 03/17/2023 Active Multiple Informants  TURMERIC CURCUMIN PO [id number] No Take 2,000 mg by mouth in the morning and at bedtime. [provider] 03/17/2023 Active Multiple Informants            Home Care and Equipment/Supplies: Were Home Health Services Ordered?: NA Any new equipment or medical supplies ordered?: NA  Functional Questionnaire: Do you need assistance with bathing/showering or dressing?: No Do you need assistance with meal preparation?: No Do you need assistance with eating?: No Do you have difficulty maintaining continence: No Do you need assistance with getting out of bed/getting out of a chair/moving?: No Do you have difficulty managing or taking your medications?: No  Follow up appointments reviewed: PCP Follow-up appointment confirmed?: Yes Date of  PCP follow-up appointment?: 04/03/23 Follow-up Provider: [facility] Follow-up appointment confirmed?: NA Do you need transportation to your follow-up appointment?: No Do you understand care options if your condition(s) worsen?: Yes-patient  verbalized understanding    SIGNATURE Theresa Mulligan LPN [facility] AWV Team Direct Dial # [phone]

## 2023-03-21 ENCOUNTER — Ambulatory Visit: Payer: Medicare Other | Attending: Internal Medicine

## 2023-03-21 DIAGNOSIS — Z5181 Encounter for therapeutic drug level monitoring: Secondary | ICD-10-CM | POA: Diagnosis not present

## 2023-03-21 DIAGNOSIS — I82409 Acute embolism and thrombosis of unspecified deep veins of unspecified lower extremity: Secondary | ICD-10-CM | POA: Diagnosis not present

## 2023-03-21 LAB — POCT INR: INR: 1.2 — AB (ref 2.0–3.0)

## 2023-03-21 NOTE — Patient Instructions (Signed)
Patient's Coumadin was stopped at hospital discharge 5/27 until further tests for DVT per patient. We will get f/u appt scheduled. HOLD until further advised. Continue warfarin 1/2 tablet (2.5 mg) daily except 1 tablet (5 mg) on MONDAYS, WEDNESDAYS and FRIDAYS.  Keep your green leafy vegetables- pick a day (or days)  to have your greens and have them every week Pt states he would like to transition to 2mg  tablets like his wife is on when he completes his current rx of Warfarin.  Pt states he has several weeks of current Warfarin prescription left.  Pt will call when ready to refill Warfarin [phone].

## 2023-04-02 DIAGNOSIS — D649 Anemia, unspecified: Secondary | ICD-10-CM | POA: Insufficient documentation

## 2023-04-02 NOTE — Progress Notes (Unsigned)
Subjective:    [name], male    DOB: 1934-02-09, 89 y.o.   MRN: [id number]     HPI Andrew [name] is here for follow up of his chronic medical problems.  Recent hospitalization for hip pain - found to have intramuscular hematoma.  Pain started the week prior to memorial day.  No injury. Monitor cbc, repeat LE [place] to confirm dvt.  To have [place] tomorrow.    Legs still weak, chronic back pain -- has done PT in the past and it helped.  Medications and allergies reviewed with patient and updated if appropriate.  Current Outpatient Medications on File Prior to Visit  Medication Sig Dispense Refill   Acetaminophen (TYLENOL 8 HOUR PO) Take 1,000 mg by mouth 3 (three) times daily.     aspirin EC 81 MG tablet Take 81 mg by mouth daily.     atorvastatin (LIPITOR) 20 MG tablet TAKE 1 TABLET DAILY AT     6:00PM (Patient taking differently: Take 20 mg by mouth every evening.) 90 tablet 1   carvedilol (COREG) 6.25 MG tablet Take 1 tablet (6.25 mg total) by mouth 2 (two) times daily with a meal. 180 tablet 3   cholecalciferol (VITAMIN D) 1000 UNITS tablet Take 1,000 Units by mouth daily.     metFORMIN (GLUCOPHAGE-XR) 500 MG 24 hr tablet TAKE 1 TABLET TWICE A DAY (Patient taking differently: Take 500 mg by mouth in the morning and at bedtime.) 180 tablet 3   HYDROcodone-acetaminophen (NORCO/VICODIN) 5-325 MG tablet Take 1 tablet by mouth every 6 (six) hours as needed for moderate pain. (Patient not taking: Reported on 04/03/2023)     Multiple Vitamin (MULTIVITAMIN WITH MINERALS) TABS tablet Take 1 tablet by mouth daily.     niacin (NIASPAN) 1000 MG CR tablet TAKE 1 TABLET AT BEDTIME (Patient taking differently: Take 1,000 mg by mouth at bedtime.) 90 tablet 3   TURMERIC CURCUMIN PO Take 2,000 mg by mouth in the morning and at bedtime.     No current facility-administered medications on file prior to visit.     Review of Systems  Constitutional:  Positive for fatigue. Negative for  chills and fever.  Respiratory:  Negative for cough, shortness of breath (with stairs - not new) and wheezing.   Cardiovascular:  Negative for chest pain, palpitations and leg swelling.  Neurological:  Negative for light-headedness and headaches.       Objective:   Vitals:   04/03/23 0821  BP: 108/62  Pulse: 65  Temp: 98.3 F (36.8 C)  SpO2: 98%   BP Readings from Last 3 Encounters:  04/03/23 108/62  03/19/23 128/67  11/08/22 124/62   Wt Readings from Last 3 Encounters:  04/03/23 164 lb (74.4 kg)  11/08/22 166 lb 9.6 oz (75.6 kg)  10/02/22 166 lb 9.6 oz (75.6 kg)   Body mass index is 24.94 kg/m.    Physical Exam Constitutional:      General: He is not in acute distress.    Appearance: Normal appearance. He is not ill-appearing.  HENT:     Head: Normocephalic and atraumatic.  Eyes:     Conjunctiva/sclera: Conjunctivae normal.  Cardiovascular:     Rate and Rhythm: Normal rate and regular rhythm.     Heart sounds: Normal heart sounds.  Pulmonary:     Effort: Pulmonary effort is normal. No respiratory distress.     Breath sounds: Normal breath sounds. No wheezing or rales.  Musculoskeletal:  Right lower leg: No edema.     Left lower leg: No edema.  Skin:    General: Skin is warm and dry.     Findings: No rash.  Neurological:     Mental Status: He is alert. Mental status is at baseline.  Psychiatric:        Mood and Affect: Mood normal.        Lab Results  Component Value Date   WBC 10.7 (H) 03/19/2023   HGB 9.0 (L) 03/19/2023   HCT 28.4 (L) 03/19/2023   PLT 161 03/19/2023   GLUCOSE 117 (H) 03/19/2023   CHOL 123 10/02/2022   TRIG 111.0 10/02/2022   HDL 58.20 10/02/2022   LDLDIRECT 72.8 04/18/2011   LDLCALC 43 10/02/2022   ALT 13 10/02/2022   AST 17 10/02/2022   NA 139 03/19/2023   K 4.3 03/19/2023   CL 109 03/19/2023   CREATININE 1.30 (H) 03/19/2023   BUN 37 (H) 03/19/2023   CO2 23 03/19/2023   TSH 1.12 10/01/2019   PSA 0.6 09/22/2010    INR 1.2 (A) 03/21/2023   HGBA1C 6.7 (H) 10/02/2022   MICROALBUR 1.3 04/03/2022     Assessment & Plan:    See Problem List for Assessment and Plan of chronic medical problems.

## 2023-04-02 NOTE — Assessment & Plan Note (Signed)
Acute on chronic Chronic anemia likely related to CKD Acute anemia related to intramuscular hematoma/warfarin Warfarin on hold Recheck CBC

## 2023-04-02 NOTE — Patient Instructions (Addendum)
      Blood work was ordered.   The lab is on the first floor.    Medications changes include :       A referral was ordered and someone will call you to schedule an appointment.     Return in about 6 months (around 10/03/2023) for Physical Exam.

## 2023-04-03 ENCOUNTER — Ambulatory Visit (INDEPENDENT_AMBULATORY_CARE_PROVIDER_SITE_OTHER): Payer: Medicare Other | Admitting: Internal Medicine

## 2023-04-03 ENCOUNTER — Encounter: Payer: Self-pay | Admitting: Internal Medicine

## 2023-04-03 VITALS — BP 108/62 | HR 65 | Temp 98.3°F | Ht 68.0 in | Wt 164.0 lb

## 2023-04-03 DIAGNOSIS — Z7984 Long term (current) use of oral hypoglycemic drugs: Secondary | ICD-10-CM

## 2023-04-03 DIAGNOSIS — E1149 Type 2 diabetes mellitus with other diabetic neurological complication: Secondary | ICD-10-CM | POA: Diagnosis not present

## 2023-04-03 DIAGNOSIS — R29898 Other symptoms and signs involving the musculoskeletal system: Secondary | ICD-10-CM

## 2023-04-03 DIAGNOSIS — N1832 Chronic kidney disease, stage 3b: Secondary | ICD-10-CM

## 2023-04-03 DIAGNOSIS — T148XXA Other injury of unspecified body region, initial encounter: Secondary | ICD-10-CM | POA: Diagnosis not present

## 2023-04-03 DIAGNOSIS — D649 Anemia, unspecified: Secondary | ICD-10-CM | POA: Diagnosis not present

## 2023-04-03 DIAGNOSIS — I25708 Atherosclerosis of coronary artery bypass graft(s), unspecified, with other forms of angina pectoris: Secondary | ICD-10-CM

## 2023-04-03 DIAGNOSIS — Z86718 Personal history of other venous thrombosis and embolism: Secondary | ICD-10-CM

## 2023-04-03 DIAGNOSIS — E785 Hyperlipidemia, unspecified: Secondary | ICD-10-CM

## 2023-04-03 LAB — CBC WITH DIFFERENTIAL/PLATELET
Basophils Absolute: 0 10*3/uL (ref 0.0–0.1)
Basophils Relative: 0.8 % (ref 0.0–3.0)
Eosinophils Absolute: 0.2 10*3/uL (ref 0.0–0.7)
Eosinophils Relative: 4 % (ref 0.0–5.0)
HCT: 30 % — ABNORMAL LOW (ref 39.0–52.0)
Hemoglobin: 9.8 g/dL — ABNORMAL LOW (ref 13.0–17.0)
Lymphocytes Relative: 16.5 % (ref 12.0–46.0)
Lymphs Abs: 1 10*3/uL (ref 0.7–4.0)
MCHC: 32.7 g/dL (ref 30.0–36.0)
MCV: 107.9 fl — ABNORMAL HIGH (ref 78.0–100.0)
Monocytes Absolute: 0.5 10*3/uL (ref 0.1–1.0)
Monocytes Relative: 7.8 % (ref 3.0–12.0)
Neutro Abs: 4.3 10*3/uL (ref 1.4–7.7)
Neutrophils Relative %: 70.9 % (ref 43.0–77.0)
Platelets: 225 10*3/uL (ref 150.0–400.0)
RBC: 2.78 Mil/uL — ABNORMAL LOW (ref 4.22–5.81)
RDW: 16.8 % — ABNORMAL HIGH (ref 11.5–15.5)
WBC: 6.1 10*3/uL (ref 4.0–10.5)

## 2023-04-03 LAB — COMPREHENSIVE METABOLIC PANEL
ALT: 11 U/L (ref 0–53)
AST: 14 U/L (ref 0–37)
Albumin: 3.9 g/dL (ref 3.5–5.2)
Alkaline Phosphatase: 37 U/L — ABNORMAL LOW (ref 39–117)
BUN: 32 mg/dL — ABNORMAL HIGH (ref 6–23)
CO2: 26 mEq/L (ref 19–32)
Calcium: 9.1 mg/dL (ref 8.4–10.5)
Chloride: 106 mEq/L (ref 96–112)
Creatinine, Ser: 1.39 mg/dL (ref 0.40–1.50)
GFR: 45.81 mL/min — ABNORMAL LOW (ref >60.00)
Glucose, Bld: 226 mg/dL — ABNORMAL HIGH (ref 70–99)
Potassium: 4.5 mEq/L (ref 3.5–5.1)
Sodium: 138 mEq/L (ref 135–145)
Total Bilirubin: 0.7 mg/dL (ref 0.2–1.2)
Total Protein: 6.4 g/dL (ref 6.0–8.3)

## 2023-04-03 LAB — HEMOGLOBIN A1C: Hgb A1c MFr Bld: 6.1 % (ref 4.6–6.5)

## 2023-04-03 NOTE — Assessment & Plan Note (Signed)
Chronic Follow with cardiology symptoms consistent with angina Continue aspirin 81 mg daily, will add atorvastatin 20 mg daily, Coreg 6.25 mg daily, Niaspan 1000 mg at bedtime 

## 2023-04-03 NOTE — Assessment & Plan Note (Addendum)
New Recently diagnosed with intramuscular hematoma Warfarin on hold Pain is improving-has hydrocodone to take as needed Recheck CBC

## 2023-04-03 NOTE — Assessment & Plan Note (Signed)
Chronic Likely related to chronic back pain Recommended physical therapy which she would like to consider doing soon once he recovers from his current injury-hematoma I think he would benefit from it-he will let me know when he wants to do it where he wants to go-will do PT for chronic back pain and leg weakness

## 2023-04-03 NOTE — Assessment & Plan Note (Signed)
Chronic ?Stable ?CMP ?

## 2023-04-03 NOTE — Assessment & Plan Note (Signed)
Chronic Regular exercise and healthy diet encouraged Continue atorvastatin 20 mg daily 

## 2023-04-03 NOTE — Assessment & Plan Note (Addendum)
Chronic Had DVT several years ago Has been on Warfarin-monitored by cardiology Recent intramuscular hematoma resulting in anemia Warfarin on hold Recheck CBC Ultrasound left lower extremity to evaluate if clot is still present is scheduled for tomorrow-?  Still need anticoagulation.  Clot was provoked after an injury so most likely healing should be able to discontinue it permanently

## 2023-04-03 NOTE — Assessment & Plan Note (Signed)
Chronic   Lab Results  Component Value Date   HGBA1C 6.7 (H) 10/02/2022   Sugars controlled Check A1c Continue metformin XR 500 mg twice daily regular exercise, diabetic diet

## 2023-04-04 ENCOUNTER — Ambulatory Visit: Payer: Medicare Other | Attending: Nurse Practitioner | Admitting: Nurse Practitioner

## 2023-04-04 ENCOUNTER — Encounter: Payer: Self-pay | Admitting: Nurse Practitioner

## 2023-04-04 VITALS — BP 108/56 | HR 67 | Ht 68.0 in | Wt 163.4 lb

## 2023-04-04 DIAGNOSIS — E1149 Type 2 diabetes mellitus with other diabetic neurological complication: Secondary | ICD-10-CM | POA: Diagnosis not present

## 2023-04-04 DIAGNOSIS — I10 Essential (primary) hypertension: Secondary | ICD-10-CM

## 2023-04-04 DIAGNOSIS — E782 Mixed hyperlipidemia: Secondary | ICD-10-CM | POA: Diagnosis not present

## 2023-04-04 DIAGNOSIS — I25708 Atherosclerosis of coronary artery bypass graft(s), unspecified, with other forms of angina pectoris: Secondary | ICD-10-CM | POA: Diagnosis not present

## 2023-04-04 DIAGNOSIS — Z86718 Personal history of other venous thrombosis and embolism: Secondary | ICD-10-CM

## 2023-04-04 DIAGNOSIS — T148XXA Other injury of unspecified body region, initial encounter: Secondary | ICD-10-CM

## 2023-04-04 DIAGNOSIS — R001 Bradycardia, unspecified: Secondary | ICD-10-CM

## 2023-04-04 NOTE — Patient Instructions (Signed)
Medication Instructions:  Your Physician recommend you continue on your current medication as directed.     *If you need a refill on your cardiac medications before your next appointment, please call your pharmacy*   Lab Work: No labs ordered today.  Testing/Procedures: Your physician has requested that you have a lower extremity venous duplex. This test is an ultrasound of the veins in the legs or arms. It looks at venous blood flow that carries blood from the heart to the legs or arms. Allow one hour for a Lower Venous exam. Allow thirty minutes for an Upper Venous exam. There are no restrictions or special instructions.     Follow-Up: At [facility], you and your health needs are our priority.  As part of our continuing mission to provide you with exceptional heart care, we have created designated Provider Care Teams.  These Care Teams include your primary Cardiologist (physician) and Advanced Practice Providers (APPs -  Physician Assistants and Nurse Practitioners) who all work together to provide you with the care you need, when you need it.  We recommend signing up for the patient portal called "MyChart".  Sign up information is provided on this After Visit Summary.  MyChart is used to connect with patients for Virtual Visits (Telemedicine).  Patients are able to view lab/test results, encounter notes, upcoming appointments, etc.  Non-urgent messages can be sent to your provider as well.   To learn more about what you can do with MyChart, go to [email or web address].    Your next appointment:   3 month(s)  Provider:   Julien Nordmann, MD or Nicolasa Ducking, NP

## 2023-04-04 NOTE — Progress Notes (Signed)
Office Visit    Patient Name: Andrew [name] Date of Encounter: 04/04/2023  Primary Care Provider:  Pincus Sanes, MD Primary Cardiologist:  Julien Nordmann, MD  Chief Complaint    89 year old male with history of CAD status CABG 2010, hypertension, hyperlipidemia, diabetes, recurrent DVT, symptomatic bradycardia status post permanent pacemaker, and interstitial lung disease, who presents for follow-up after recent hospitalization for right hip hematoma requiring holding of warfarin.  Past Medical History    Past Medical History:  Diagnosis Date   Colonic polyp 2011 last colo   colo q 72yr - [facility]    Coronary artery disease 02/2009   a. 02/2009 Cath: severe left main and three-vessel-->CABG; b. 12/2016 MVL EF 49%, no isch/infarct.   DEGENERATIVE JOINT DISEASE    Diastolic dysfunction    a. 10/2021 Echo: EF 55-60%, no rwma, mod conc LVH, GrI DD, mildly reduced RV fxn, RVSP 26.8 mmHg. mild AI, and aortic sclerosis.   Diverticulosis of colon    DIVERTICULOSIS, COLON    History of kidney stones    HYPERLIPIDEMIA-MIXED    HYPERTENSION, BENIGN    Interstitial lung disease (HCC)    LBP (low back pain)    s/p decompression 8/14; ESI - [place]; RFA x 3 early 2015   Pacemaker    a. 05/2017: St. Jude (serial number [id number]) pacemaker   Symptomatic bradycardia    a. s/p St. Jude (serial number [id number]) pacemaker 06/16/17   Type II or unspecified type diabetes mellitus without mention of complication, not stated as uncontrolled    VITAMIN D DEFICIENCY    Past Surgical History:  Procedure Laterality Date   CARPAL TUNNEL RELEASE Right    CATARACT EXTRACTION, BILATERAL     R 07/07/13, L 07/21/13   CORONARY ARTERY BYPASS GRAFT  03/17/09   x 4   CYSTOSCOPY WITH STENT PLACEMENT Left 05/10/2021   Procedure: CYSTOSCOPY WITH LEFT  STENT PLACEMENT,LEFT RETROGRADE PYELOGRAM;  Surgeon: Crist Fat, MD;  Location: [facility];  Service: Urology;  Laterality: Left;    CYSTOSCOPY/URETEROSCOPY/HOLMIUM LASER/STENT PLACEMENT Left 06/03/2021   Procedure: CYSTOSCOPY, LEFT RETROGRADE, URETEROSCOPY,HOLMIUM LASER,STENT EXCHANGE;  Surgeon: Crist Fat, MD;  Location: [facility];  Service: Urology;  Laterality: Left;   INGUINAL HERNIA REPAIR  1980   KNEE ARTHROSCOPY  2007   right   LUMBAR EPIDURAL INJECTION  2013   Has as needed.   LUMBAR LAMINECTOMY/DECOMPRESSION MICRODISCECTOMY N/A 06/19/2013   Procedure: LUMBAR LAMINECTOMY/DECOMPRESSION MICRODISCECTOMY;  Surgeon: Emilee Hero, MD;  Location: [facility] OR;  Service: Orthopedics;  Laterality: N/A;  Lumbar 4-5 decompression   PACEMAKER IMPLANT N/A 06/16/2017   Procedure: Pacemaker Implant;  Surgeon: Marinus Maw, MD;  Location: [facility] INVASIVE CV LAB;  Service: Cardiovascular;  Laterality: N/A;   TRIGGER FINGER RELEASE Right     Allergies  Allergies  Allergen Reactions   Penicillins Other (See Comments) and Anaphylaxis    Taste buds    History of Present Illness      89 year old male with above past medical history including CAD status post CABG 2010, hypertension, hyperlipidemia, diabetes, recurrent DVT, symptomatic bradycardia status post permanent pacemaker, near syncope, and interstitial lung disease.  Most recent stress testing was in 2018, and was low risk.  He underwent pacemaker placement at that time in the setting of symptomatic bradycardia.  Most recent echo 10/2021 w/ EF 55-60%, no rwma, mod conc LVH, GrI DD, mildly reduced RV fxn, RVSP 26.8 mmHg. mild AI, and aortic sclerosis.  Has been on chronic  warfarin therapy in the setting of lower extremity DVT on the left.   Mr. Allen was hospitalized over Labor Day weekend this year due to progressive right hip pain.  He was found to have an 11cm right gluteal intramuscular hematoma.  Warfarin was held.  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  His INR was 3.5 on admission.  Coumadin was held and he was subsequently discharged.  Outpatient lower extremity ultrasound to reevaluate for chronic DVT was recommended.  Since discharge, patient has had steady improvement of right hip discomfort.  He has had some bruising moving down his leg.  He still struggles some to flex the hip to put his socks on due to discomfort.  He had follow-up lab work yesterday which shows stable H&H.  He denies chest pain, dyspnea, palpitations, PND, orthopnea, dizziness, syncope, or early satiety.  He has chronic left lower extremity swelling.  He was under the impression that he was scheduled for a lower extremity ultrasound today.  Home Medications    Current Outpatient Medications  Medication Sig Dispense Refill   Acetaminophen (TYLENOL 8 HOUR PO) Take 1,000 mg by mouth 3 (three) times daily.     aspirin EC 81 MG tablet Take 81 mg by mouth daily.     atorvastatin (LIPITOR) 20 MG tablet TAKE 1 TABLET DAILY AT     6:00PM 90 tablet 1   carvedilol (COREG) 6.25 MG tablet Take 1 tablet (6.25 mg total) by mouth 2 (two) times daily with a meal. 180 tablet 3   cholecalciferol (VITAMIN D) 1000 UNITS tablet Take 1,000 Units by mouth daily.     metFORMIN (GLUCOPHAGE-XR) 500 MG 24 hr tablet TAKE 1 TABLET TWICE A DAY 180 tablet 3   Multiple Vitamin (MULTIVITAMIN WITH MINERALS) TABS tablet Take 1 tablet by mouth daily.     niacin (NIASPAN) 1000 MG CR tablet TAKE 1 TABLET AT BEDTIME 90 tablet 3   TURMERIC CURCUMIN PO Take 2,000 mg by mouth in the morning and at bedtime.     HYDROcodone-acetaminophen (NORCO/VICODIN) 5-325 MG tablet Take 1 tablet by mouth every 6 (six) hours as needed for moderate pain. (Patient not taking: Reported on 04/03/2023)     No current facility-administered medications for this visit.     Review of Systems    Right hip pain improving.  Chronic left lower extremity edema.  He denies chest pain, dyspnea, palpitations, PND,  orthopnea, dizziness, syncope, or early satiety.  All other systems reviewed and are otherwise negative except as noted above.    Physical Exam    VS:  BP (!) 108/56 (BP Location: Left Arm, Patient Position: Sitting, Cuff Size: Normal)   Pulse 67   Ht 5\' 8"  (1.727 m)   Wt 163 lb 6 oz (74.1 kg)   SpO2 97%   BMI 24.84 kg/m  , BMI Body mass index is 24.84 kg/m.     GEN: Well nourished, well developed, in no acute distress. HEENT: normal. Neck: Supple, no JVD, carotid bruits, or masses. Cardiac: RRR, no murmurs, rubs, or gallops. No clubbing, cyanosis, 1+ left lower extremity edema.  Mild ecchymosis noted to the right leg.  Radials 2+/PT 2+ and equal bilaterally.  Respiratory:  Respirations regular and unlabored, clear to auscultation bilaterally. GI: Soft, nontender, nondistended, BS + x 4. MS: no deformity or atrophy. Skin: warm and  dry, no rash. Neuro:  Strength and sensation are intact. Psych: Normal affect.  Accessory Clinical Findings    ECG personally reviewed by me today -a sensed, V paced, 67- no acute changes.  Lab Results  Component Value Date   WBC 6.1 04/03/2023   HGB 9.8 (L) 04/03/2023   HCT 30.0 (L) 04/03/2023   MCV 107.9 (H) 04/03/2023   PLT 225.0 04/03/2023   Lab Results  Component Value Date   CREATININE 1.39 04/03/2023   BUN 32 (H) 04/03/2023   NA 138 04/03/2023   K 4.5 04/03/2023   CL 106 04/03/2023   CO2 26 04/03/2023   Lab Results  Component Value Date   ALT 11 04/03/2023   AST 14 04/03/2023   ALKPHOS 37 (L) 04/03/2023   BILITOT 0.7 04/03/2023   Lab Results  Component Value Date   CHOL 123 10/02/2022   HDL 58.20 10/02/2022   LDLCALC 43 10/02/2022   LDLDIRECT 72.8 04/18/2011   TRIG 111.0 10/02/2022   CHOLHDL 2 10/02/2022    Lab Results  Component Value Date   HGBA1C 6.1 04/03/2023   Lab Results  Component Value Date   INR 1.2 (A) 03/21/2023   INR 1.3 (H) 03/19/2023   INR 3.5 (H) 03/18/2023    Assessment & Plan    1.  History  of left lower extremity DVT with a recent spontaneous 11 cm right gluteal intramuscular hematoma: Patient admitted over Memorial Day weekend with right hip pain after finding of 11 cm right gluteal intramuscular hematoma in the setting of an INR of 3.5 on warfarin therapy.  He notes that he was riding his tractor the day prior to admission but did not have any significant trauma other than bouncing a little bit in the seat of his tractor.  His Coumadin was held at discharge with recommendation for follow-up extremity ultrasound to evaluate clot burden and determine long-term need for anticoagulation and or INR goals.  Patient thought this was scheduled for today however, it is not.  I will order.  I suspect he would be better served with Eliquis in the long run however, he says he is not able to afford it.  Pending ultrasound, could potentially shoot for lower INR goals with closer monitoring to prevent future bleeding, though may need vascular surgical referral for consideration for IVC filter.  H/H stable @ 9.8/30.0 on 6/11.  2.  Coronary artery disease: Status post CABG in 2010 with low risk stress test in 2018.  He has not been having any chest pain or dyspnea.  He remains on aspirin, statin, beta-blocker.  3.  Primary hypertension: Stable.  Continue beta-blocker therapy.  4.  Hyperlipidemia: LDL 43 in December 2023.  Continue statin therapy.  5.  Type 2 diabetes mellitus: A1c 6.1.  On metformin.  6.  Symptomatic bradycardia: Status post permanent pacemaker placement.  A sensed and V paced on ECG today.  7.  Disposition: Follow-up lower extremity venous duplex with further recs pending.  Nicolasa Ducking, NP 04/04/2023, 1:12 PM

## 2023-04-05 NOTE — Progress Notes (Signed)
Remote pacemaker transmission.   

## 2023-04-06 ENCOUNTER — Other Ambulatory Visit: Payer: Self-pay | Admitting: Nurse Practitioner

## 2023-04-06 DIAGNOSIS — E1149 Type 2 diabetes mellitus with other diabetic neurological complication: Secondary | ICD-10-CM

## 2023-04-06 DIAGNOSIS — T148XXA Other injury of unspecified body region, initial encounter: Secondary | ICD-10-CM

## 2023-04-06 DIAGNOSIS — I25708 Atherosclerosis of coronary artery bypass graft(s), unspecified, with other forms of angina pectoris: Secondary | ICD-10-CM

## 2023-04-06 DIAGNOSIS — E782 Mixed hyperlipidemia: Secondary | ICD-10-CM

## 2023-04-06 DIAGNOSIS — R001 Bradycardia, unspecified: Secondary | ICD-10-CM

## 2023-04-06 DIAGNOSIS — Z86718 Personal history of other venous thrombosis and embolism: Secondary | ICD-10-CM

## 2023-04-06 DIAGNOSIS — M25473 Effusion, unspecified ankle: Secondary | ICD-10-CM

## 2023-04-06 DIAGNOSIS — I10 Essential (primary) hypertension: Secondary | ICD-10-CM

## 2023-04-10 ENCOUNTER — Ambulatory Visit (HOSPITAL_COMMUNITY)
Admission: RE | Admit: 2023-04-10 | Discharge: 2023-04-10 | Disposition: A | Discharge status: Home / Self Care | Payer: Medicare Other | Source: Ambulatory Visit | Attending: Nurse Practitioner | Admitting: Nurse Practitioner

## 2023-04-10 DIAGNOSIS — M25579 Pain in unspecified ankle and joints of unspecified foot: Secondary | ICD-10-CM | POA: Insufficient documentation

## 2023-04-10 DIAGNOSIS — M25473 Effusion, unspecified ankle: Secondary | ICD-10-CM | POA: Insufficient documentation

## 2023-04-11 ENCOUNTER — Telehealth: Payer: Self-pay

## 2023-04-11 ENCOUNTER — Telehealth: Payer: Self-pay | Admitting: Nurse Practitioner

## 2023-04-11 NOTE — Telephone Encounter (Signed)
Follow Up:      Patient is returning call from this morning. He does not know who called. He thinks it was about his test results

## 2023-04-11 NOTE — Telephone Encounter (Signed)
See separate note.

## 2023-04-11 NOTE — Telephone Encounter (Signed)
I spoke to patient and informed him to start Coumadin today, 6/19 2.5 mg Daily.  I will see him next week in the office for INR check

## 2023-04-14 ENCOUNTER — Other Ambulatory Visit: Payer: Self-pay | Admitting: Internal Medicine

## 2023-04-18 ENCOUNTER — Ambulatory Visit: Payer: Medicare Other | Attending: Internal Medicine

## 2023-04-18 DIAGNOSIS — Z5181 Encounter for therapeutic drug level monitoring: Secondary | ICD-10-CM | POA: Diagnosis not present

## 2023-04-18 DIAGNOSIS — I82409 Acute embolism and thrombosis of unspecified deep veins of unspecified lower extremity: Secondary | ICD-10-CM

## 2023-04-18 LAB — POCT INR: INR: 1.4 — AB (ref 2.0–3.0)

## 2023-04-18 NOTE — Patient Instructions (Signed)
Take 0.5 tablet Daily, except 1 tablet on Wednesday.  INR in 3 weeks.  [phone]

## 2023-04-20 ENCOUNTER — Other Ambulatory Visit: Payer: Self-pay | Admitting: Internal Medicine

## 2023-05-09 ENCOUNTER — Ambulatory Visit: Payer: Medicare Other | Attending: Internal Medicine

## 2023-05-09 DIAGNOSIS — I82409 Acute embolism and thrombosis of unspecified deep veins of unspecified lower extremity: Secondary | ICD-10-CM | POA: Diagnosis not present

## 2023-05-09 DIAGNOSIS — Z5181 Encounter for therapeutic drug level monitoring: Secondary | ICD-10-CM | POA: Diagnosis not present

## 2023-05-09 LAB — POCT INR: INR: 2 (ref 2.0–3.0)

## 2023-05-09 NOTE — Patient Instructions (Signed)
Take 0.5 tablet Daily, except 1 tablet on Wednesday.  INR in 4 weeks.  [phone]

## 2023-05-25 ENCOUNTER — Other Ambulatory Visit: Payer: Self-pay | Admitting: Internal Medicine

## 2023-06-06 ENCOUNTER — Ambulatory Visit: Payer: Medicare Other

## 2023-06-06 DIAGNOSIS — I82409 Acute embolism and thrombosis of unspecified deep veins of unspecified lower extremity: Secondary | ICD-10-CM | POA: Diagnosis not present

## 2023-06-06 DIAGNOSIS — Z5181 Encounter for therapeutic drug level monitoring: Secondary | ICD-10-CM | POA: Diagnosis not present

## 2023-06-06 LAB — POCT INR: INR: 3.7 — AB (ref 2.0–3.0)

## 2023-06-06 NOTE — Patient Instructions (Signed)
HOLD TODAY and THURSDAY THEN Take 0.5 tablet Daily.  INR in 3 weeks.  [phone]

## 2023-06-13 ENCOUNTER — Ambulatory Visit: Payer: Medicare Other

## 2023-06-13 DIAGNOSIS — I442 Atrioventricular block, complete: Secondary | ICD-10-CM | POA: Diagnosis not present

## 2023-06-13 LAB — CUP PACEART REMOTE DEVICE CHECK
Battery Remaining Longevity: 58 mo
Battery Remaining Percentage: 46 %
Battery Voltage: 2.98 V
Brady Statistic AP VP Percent: 12 %
Brady Statistic AP VS Percent: 1 %
Brady Statistic AS VP Percent: 88 %
Brady Statistic AS VS Percent: 1 %
Brady Statistic RA Percent Paced: 11 %
Brady Statistic RV Percent Paced: 99 %
Date Time Interrogation Session: 20240821023316
Implantable Lead Connection Status: 753985
Implantable Lead Connection Status: 753985
Implantable Lead Implant Date: 20180825
Implantable Lead Implant Date: 20180825
Implantable Lead Location: 753859
Implantable Lead Location: 753860
Implantable Pulse Generator Implant Date: 20180825
Lead Channel Impedance Value: 440 Ohm
Lead Channel Impedance Value: 600 Ohm
Lead Channel Pacing Threshold Amplitude: 0.5 V
Lead Channel Pacing Threshold Amplitude: 0.625 V
Lead Channel Pacing Threshold Pulse Width: 0.5 ms
Lead Channel Pacing Threshold Pulse Width: 0.5 ms
Lead Channel Sensing Intrinsic Amplitude: 10.2 mV
Lead Channel Sensing Intrinsic Amplitude: 4 mV
Lead Channel Setting Pacing Amplitude: 0.875
Lead Channel Setting Pacing Amplitude: 2 V
Lead Channel Setting Pacing Pulse Width: 0.5 ms
Lead Channel Setting Sensing Sensitivity: 4 mV
Pulse Gen Model: 2272
Pulse Gen Serial Number: 8939923

## 2023-06-27 ENCOUNTER — Ambulatory Visit: Payer: Medicare Other | Attending: Internal Medicine

## 2023-06-27 DIAGNOSIS — I82409 Acute embolism and thrombosis of unspecified deep veins of unspecified lower extremity: Secondary | ICD-10-CM

## 2023-06-27 DIAGNOSIS — Z5181 Encounter for therapeutic drug level monitoring: Secondary | ICD-10-CM

## 2023-06-27 LAB — POCT INR: INR: 2.2 (ref 2.0–3.0)

## 2023-06-27 NOTE — Patient Instructions (Signed)
Continue 0.5 tablet Daily.  Eat greens in the next few days.  INR in 4 weeks.  [phone]

## 2023-06-27 NOTE — Progress Notes (Signed)
Remote pacemaker transmission. ,  

## 2023-07-06 ENCOUNTER — Ambulatory Visit: Payer: Medicare Other | Admitting: Cardiovascular Disease

## 2023-07-06 NOTE — Progress Notes (Unsigned)
Cardiology Office Note  Date:  07/09/2023   ID:  Andrew [name], DOB 07/26/1934, MRN [id number]  PCP:  Pincus Sanes, MD   Chief Complaint  Patient presents with   Follow-up    3 mo f/u. Patient feels well today. Medications reviewed verbally.     HPI:  Andrew [name] is a very pleasant 89 year old gentleman with history of  coronary artery disease,  bypass in May 2010, Chronic hip and back pain, can't walk hx of near syncope hyperlipidemia,  diabetes,  hypertension,  complete heart block 06/15/2017, pacer Prior DVT , on long-term anticoagulation, chronic appears in 2018 and 2019 EF 55% no do it who presents for follow-up of his coronary artery disease, on warfarin for DVT, pacer interstitial lung Disease  Last seen by myself in clinic November 2023 Seen by one of our providers June 2024  Admitted to the hospital May 2024 worsening right hip pain, found to have right gluteal intramuscular hematoma.  INR 3.5 at the time, on warfarin  Has recovered since the above Lower extremity venous Dopplers showing chronic DVT  No regular exercise program, sedentary  Right knee pain x 1 week  Weight unchanged compared to prior visit November 2023  Denies any significant change in his breathing  Lab work reviewed A1C 6.1 Total chol 123, LDL 43 CR 1.39, BUN 32 HGB 9.8  Other past medical history reviewed CT scan chest: December 2022 interstitial lung disease  Pacer downloads reviewed, followed by EP  Other past medical history reviewed hospitalization for complete heart block 06/15/2017 Bradycardic heart rate in the 20 to 30s St. Jude pacemaker placed June 11 2017, by Dr. Ladona Ridgel  Stress test: 12/27/2016: no ischemia Results reviewed with him in detail  Event monitor 12/21/2016:  Results reviewed with him in detail Normal sinus rhythm Min HR of 43 bpm, max HR of 117 bpm, and avg HR of 72 bpm.  Rare APCs and PVCs likely causing symptoms No other significnat  arrhythmia  History of a pinched nerve, back surgery 06/19/2013. He continues to have back pain, status post radiofrequency treatments x3 through [facility].   PMH:   has a past medical history of Colonic polyp (2011 last colo), Coronary artery disease (02/2009), DEGENERATIVE JOINT DISEASE, Diastolic dysfunction, Diverticulosis of colon, DIVERTICULOSIS, COLON, History of kidney stones, HYPERLIPIDEMIA-MIXED, HYPERTENSION, BENIGN, Interstitial lung disease (HCC), LBP (low back pain), Pacemaker, Symptomatic bradycardia, Type II or unspecified type diabetes mellitus without mention of complication, not stated as uncontrolled, and VITAMIN D DEFICIENCY.  PSH:    Past Surgical History:  Procedure Laterality Date   CARPAL TUNNEL RELEASE Right    CATARACT EXTRACTION, BILATERAL     R 07/07/13, L 07/21/13   CORONARY ARTERY BYPASS GRAFT  03/17/09   x 4   CYSTOSCOPY WITH STENT PLACEMENT Left 05/10/2021   Procedure: CYSTOSCOPY WITH LEFT  STENT PLACEMENT,LEFT RETROGRADE PYELOGRAM;  Surgeon: Crist Fat, MD;  Location: [facility];  Service: Urology;  Laterality: Left;   CYSTOSCOPY/URETEROSCOPY/HOLMIUM LASER/STENT PLACEMENT Left 06/03/2021   Procedure: CYSTOSCOPY, LEFT RETROGRADE, URETEROSCOPY,HOLMIUM LASER,STENT EXCHANGE;  Surgeon: Crist Fat, MD;  Location: [facility];  Service: Urology;  Laterality: Left;   INGUINAL HERNIA REPAIR  1980   KNEE ARTHROSCOPY  2007   right   LUMBAR EPIDURAL INJECTION  2013   Has as needed.   LUMBAR LAMINECTOMY/DECOMPRESSION MICRODISCECTOMY N/A 06/19/2013   Procedure: LUMBAR LAMINECTOMY/DECOMPRESSION MICRODISCECTOMY;  Surgeon: Emilee Hero, MD;  Location: [facility] OR;  Service: Orthopedics;  Laterality: N/A;  Lumbar 4-5 decompression   PACEMAKER IMPLANT N/A 06/16/2017   Procedure: Pacemaker Implant;  Surgeon: Marinus Maw, MD;  Location: [facility] INVASIVE CV LAB;  Service: Cardiovascular;  Laterality: N/A;   TRIGGER FINGER RELEASE Right     Current  Outpatient Medications  Medication Sig Dispense Refill   Acetaminophen (TYLENOL 8 HOUR PO) Take 1,000 mg by mouth 3 (three) times daily.     aspirin EC 81 MG tablet Take 81 mg by mouth daily.     atorvastatin (LIPITOR) 20 MG tablet TAKE 1 TABLET DAILY AT     6:00PM 90 tablet 1   carvedilol (COREG) 6.25 MG tablet Take 1 tablet (6.25 mg total) by mouth 2 (two) times daily with a meal. 180 tablet 3   cholecalciferol (VITAMIN D) 1000 UNITS tablet Take 1,000 Units by mouth daily.     HYDROcodone-acetaminophen (NORCO/VICODIN) 5-325 MG tablet Take 1 tablet by mouth every 6 (six) hours as needed for moderate pain.     metFORMIN (GLUCOPHAGE-XR) 500 MG 24 hr tablet TAKE 1 TABLET TWICE A DAY 180 tablet 3   Multiple Vitamin (MULTIVITAMIN WITH MINERALS) TABS tablet Take 1 tablet by mouth daily.     niacin (NIASPAN) 1000 MG CR tablet TAKE 1 TABLET AT BEDTIME 90 tablet 3   TURMERIC CURCUMIN PO Take 2,000 mg by mouth in the morning and at bedtime.     warfarin (COUMADIN) 5 MG tablet Take 5 mg by mouth daily. Take 1-2 tablets daily or as prescribed by [facility]     No current facility-administered medications for this visit.     Allergies:   Penicillins   Social History:  The patient  reports that he has never smoked. He has never used smokeless tobacco. He reports current alcohol use. He reports that he does not use drugs.   Family History:   family history includes Diabetes in an other family member; Heart failure in his father and mother.    Review of Systems: Review of Systems  Constitutional: Negative.   HENT: Negative.    Respiratory: Negative.    Cardiovascular: Negative.   Gastrointestinal: Negative.   Musculoskeletal:  Positive for back pain and joint pain.  Neurological: Negative.   Psychiatric/Behavioral: Negative.    All other systems reviewed and are negative.   PHYSICAL EXAM: VS:  BP (!) 140/78 (BP Location: Left Arm, Patient Position: Sitting, Cuff Size: Normal)   Pulse 68    Ht 5\' 8"  (1.727 m)   Wt 162 lb 3.2 oz (73.6 kg)   SpO2 95%   BMI 24.66 kg/m  , BMI Body mass index is 24.66 kg/m. Constitutional:  oriented to person, place, and time. No distress.  HENT:  Head: Grossly normal Eyes:  no discharge. No scleral icterus.  Neck: No JVD, no carotid bruits  Cardiovascular: Regular rate and rhythm, no murmurs appreciated Pulmonary/Chest: Clear to auscultation bilaterally, no wheezes or rails Abdominal: Soft.  no distension.  no tenderness.  Musculoskeletal: Normal range of motion Neurological:  normal muscle tone. Coordination normal. No atrophy Skin: Skin warm and dry Psychiatric: normal affect, pleasant  Recent Labs: 04/03/2023: ALT 11; BUN 32; Creatinine, Ser 1.39; Hemoglobin 9.8; Platelets 225.0; Potassium 4.5; Sodium 138    Lipid Panel Lab Results  Component Value Date   CHOL 123 10/02/2022   HDL 58.20 10/02/2022   LDLCALC 43 10/02/2022   TRIG 111.0 10/02/2022     Wt Readings from Last 3 Encounters:  07/09/23 162 lb 3.2 oz (73.6 kg)  04/04/23  163 lb 6 oz (74.1 kg)  04/03/23 164 lb (74.4 kg)     ASSESSMENT AND PLAN:  Pure hypercholesterolemia -  Cholesterol is at goal on the current lipid regimen. No changes to the medications were made.  DVT dx with DVT dating back to 2018, has had chronic DVT since that time, asymptomatic Sedentary lifestyle, likely high risk of recurrent DVTs On warfarin, will try to keep INR 2.0 or less given spontaneous hematoma  Chronic back pain stable  HYPERTENSION, BENIGN -  Blood pressure is well controlled on today's visit. No changes made to the medications.  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 No further workup at this time. Continue current medication regimen.  Type 2 diabetes mellitus with neurological manifestations, controlled (HCC) A1c stable  Complete heart block,pacemaker Seen by Dr. Lalla Brothers,  annual follow-up  Arthritis Tylenol   Total encounter time more than 30 minutes  Greater than 50% was spent in counseling and coordination of care with the patient   Signed, Dossie Arbour, M.D., Ph.D. 07/09/2023  [facility] [place], [place] [phone]

## 2023-07-09 ENCOUNTER — Encounter: Payer: Self-pay | Admitting: Cardiovascular Disease

## 2023-07-09 ENCOUNTER — Ambulatory Visit: Payer: Medicare Other | Attending: Cardiovascular Disease | Admitting: Cardiovascular Disease

## 2023-07-09 VITALS — BP 140/78 | HR 68 | Ht 68.0 in | Wt 162.2 lb

## 2023-07-09 DIAGNOSIS — E1149 Type 2 diabetes mellitus with other diabetic neurological complication: Secondary | ICD-10-CM | POA: Diagnosis not present

## 2023-07-09 DIAGNOSIS — R001 Bradycardia, unspecified: Secondary | ICD-10-CM

## 2023-07-09 DIAGNOSIS — I442 Atrioventricular block, complete: Secondary | ICD-10-CM

## 2023-07-09 DIAGNOSIS — I25708 Atherosclerosis of coronary artery bypass graft(s), unspecified, with other forms of angina pectoris: Secondary | ICD-10-CM

## 2023-07-09 DIAGNOSIS — I10 Essential (primary) hypertension: Secondary | ICD-10-CM | POA: Diagnosis not present

## 2023-07-09 DIAGNOSIS — I82409 Acute embolism and thrombosis of unspecified deep veins of unspecified lower extremity: Secondary | ICD-10-CM

## 2023-07-09 DIAGNOSIS — E782 Mixed hyperlipidemia: Secondary | ICD-10-CM

## 2023-07-09 MED ORDER — CARVEDILOL 6.25 MG PO TABS: 6.2500 mg | ORAL_TABLET | Freq: Two times a day (BID) | ORAL | 3 refills | Status: DC

## 2023-07-09 NOTE — Patient Instructions (Signed)

## 2023-07-25 ENCOUNTER — Ambulatory Visit: Payer: Medicare Other | Attending: Internal Medicine

## 2023-07-25 DIAGNOSIS — Z5181 Encounter for therapeutic drug level monitoring: Secondary | ICD-10-CM | POA: Diagnosis not present

## 2023-07-25 DIAGNOSIS — I82409 Acute embolism and thrombosis of unspecified deep veins of unspecified lower extremity: Secondary | ICD-10-CM | POA: Diagnosis not present

## 2023-07-25 LAB — POCT INR: INR: 1.8 — AB (ref 2.0–3.0)

## 2023-07-25 NOTE — Patient Instructions (Signed)
Continue 0.5 tablet Daily.    INR in 6 weeks.  [phone]

## 2023-09-05 ENCOUNTER — Ambulatory Visit: Payer: Medicare Other | Attending: Internal Medicine

## 2023-09-05 DIAGNOSIS — Z5181 Encounter for therapeutic drug level monitoring: Secondary | ICD-10-CM

## 2023-09-05 DIAGNOSIS — I82409 Acute embolism and thrombosis of unspecified deep veins of unspecified lower extremity: Secondary | ICD-10-CM | POA: Diagnosis not present

## 2023-09-05 LAB — POCT INR: INR: 2 (ref 2.0–3.0)

## 2023-09-05 NOTE — Patient Instructions (Signed)
Continue 0.5 tablet Daily.    INR in 8 weeks.  [phone]

## 2023-09-12 ENCOUNTER — Ambulatory Visit (INDEPENDENT_AMBULATORY_CARE_PROVIDER_SITE_OTHER): Payer: Medicare Other

## 2023-09-12 DIAGNOSIS — I442 Atrioventricular block, complete: Secondary | ICD-10-CM | POA: Diagnosis not present

## 2023-09-12 LAB — CUP PACEART REMOTE DEVICE CHECK
Battery Remaining Longevity: 54 mo
Battery Remaining Percentage: 44 %
Battery Voltage: 2.98 V
Brady Statistic AP VP Percent: 12 %
Brady Statistic AP VS Percent: 1 %
Brady Statistic AS VP Percent: 88 %
Brady Statistic AS VS Percent: 1 %
Brady Statistic RA Percent Paced: 11 %
Brady Statistic RV Percent Paced: 99 %
Date Time Interrogation Session: 20241120021948
Implantable Lead Connection Status: 753985
Implantable Lead Connection Status: 753985
Implantable Lead Implant Date: 20180825
Implantable Lead Implant Date: 20180825
Implantable Lead Location: 753859
Implantable Lead Location: 753860
Implantable Pulse Generator Implant Date: 20180825
Lead Channel Impedance Value: 410 Ohm
Lead Channel Impedance Value: 540 Ohm
Lead Channel Pacing Threshold Amplitude: 0.5 V
Lead Channel Pacing Threshold Amplitude: 0.625 V
Lead Channel Pacing Threshold Pulse Width: 0.5 ms
Lead Channel Pacing Threshold Pulse Width: 0.5 ms
Lead Channel Sensing Intrinsic Amplitude: 3.4 mV
Lead Channel Sensing Intrinsic Amplitude: 4.6 mV
Lead Channel Setting Pacing Amplitude: 0.875
Lead Channel Setting Pacing Amplitude: 2 V
Lead Channel Setting Pacing Pulse Width: 0.5 ms
Lead Channel Setting Sensing Sensitivity: 4 mV
Pulse Gen Model: 2272
Pulse Gen Serial Number: 8939923

## 2023-09-24 ENCOUNTER — Telehealth: Payer: Self-pay | Admitting: Cardiovascular Disease

## 2023-09-24 DIAGNOSIS — I82409 Acute embolism and thrombosis of unspecified deep veins of unspecified lower extremity: Secondary | ICD-10-CM

## 2023-09-24 MED ORDER — WARFARIN SODIUM 2 MG PO TABS
ORAL_TABLET | ORAL | 0 refills | Status: DC
Start: 2023-09-24 — End: 2023-12-28

## 2023-09-24 NOTE — Telephone Encounter (Signed)
Pt's current Warfarin dose is 2.5mg  daily (1/2 5mg  tablet). Called and spoke with pt. Made him aware to maintain a therapeutic INR, he would need a 2.5mg  tablet to continue the same weekly dose (17.5mg  per week). Pt states his wife is currently on a 2mg  tablet and he would like to be on the same tablet strength to avoid confusion. I made him aware his weekly dosing instructions will have to change to try and get the same weekly amount of Warfarin he is currently taking. I instructed pt to start taking Warfarin 2mg  tablet daily EXCEPT 3mg  (1 1/2 tablets)on Monday, Wednesday, and Friday (17mg  per week). Pt verbalized understanding. Refill sent.

## 2023-09-24 NOTE — Telephone Encounter (Signed)
Pt c/o medication issue:  1. Name of Medication:   warfarin (COUMADIN) 5 MG tablet    2. How are you currently taking this medication (dosage and times per day)?   3. Are you having a reaction (difficulty breathing--STAT)?   4. What is your medication issue? Patient would like to change this medication to a 2 mg med. Requesting call back to discuss.

## 2023-10-01 ENCOUNTER — Encounter: Payer: Self-pay | Admitting: Internal Medicine

## 2023-10-01 ENCOUNTER — Ambulatory Visit: Payer: Medicare Other | Admitting: Internal Medicine

## 2023-10-01 ENCOUNTER — Ambulatory Visit (INDEPENDENT_AMBULATORY_CARE_PROVIDER_SITE_OTHER): Payer: Medicare Other

## 2023-10-01 VITALS — Ht 68.0 in | Wt 155.0 lb

## 2023-10-01 DIAGNOSIS — Z0000 Encounter for general adult medical examination without abnormal findings: Secondary | ICD-10-CM

## 2023-10-01 NOTE — Patient Instructions (Addendum)
      Blood work was ordered.       Medications changes include :   None    A referral was ordered and someone will call you to schedule an appointment.     Return in about 6 months (around 04/01/2024) for Physical Exam.

## 2023-10-01 NOTE — Progress Notes (Unsigned)
Subjective:    [name], male    DOB: 06/16/1934, 89 y.o.   MRN: [id number]     HPI [name] is here for follow up of his chronic medical problems.  He did fall - when he was pushing a wheel barrel and it feel - he fell.  No major injuries.   Uses his cane - feels more secure using it with his hip pain.  His right hip pain is worse than the left - sees ortho  Medications and allergies reviewed with patient and updated if appropriate.  Current Outpatient Medications on File Prior to Visit  Medication Sig Dispense Refill   Acetaminophen (TYLENOL 8 HOUR PO) Take 1,000 mg by mouth 3 (three) times daily.     aspirin EC 81 MG tablet Take 81 mg by mouth daily.     atorvastatin (LIPITOR) 20 MG tablet TAKE 1 TABLET DAILY AT     6:00PM 90 tablet 1   carvedilol (COREG) 6.25 MG tablet Take 1 tablet (6.25 mg total) by mouth 2 (two) times daily with a meal. 180 tablet 3   cholecalciferol (VITAMIN D) 1000 UNITS tablet Take 1,000 Units by mouth daily.     HYDROcodone-acetaminophen (NORCO/VICODIN) 5-325 MG tablet Take 1 tablet by mouth every 6 (six) hours as needed for moderate pain.     metFORMIN (GLUCOPHAGE-XR) 500 MG 24 hr tablet TAKE 1 TABLET TWICE A DAY 180 tablet 3   Multiple Vitamin (MULTIVITAMIN WITH MINERALS) TABS tablet Take 1 tablet by mouth daily.     niacin (NIASPAN) 1000 MG CR tablet TAKE 1 TABLET AT BEDTIME 90 tablet 3   TURMERIC CURCUMIN PO Take 2,000 mg by mouth in the morning and at bedtime.     warfarin (COUMADIN) 2 MG tablet TAKE 1 TABLET TO 1.5 TABLETS BY MOUTH DAILY AS DIRECTED BY COUMADIN CLINIC 120 tablet 0   No current facility-administered medications on file prior to visit.     Review of Systems  Constitutional:  Negative for fever.  Respiratory:  Positive for shortness of breath (with strenuous exertion - not new). Negative for cough and wheezing.   Cardiovascular:  Negative for chest pain, palpitations and leg swelling (chronic left leg  from dvt).  Neurological:  Negative for dizziness, light-headedness and headaches.       Objective:   Vitals:   10/02/23 0748  BP: 114/60  Pulse: 69  Temp: 98 F (36.7 C)  SpO2: 95%   BP Readings from Last 3 Encounters:  10/02/23 114/60  07/09/23 (!) 140/78  04/04/23 (!) 108/56   Wt Readings from Last 3 Encounters:  10/02/23 168 lb (76.2 kg)  10/01/23 155 lb (70.3 kg)  07/09/23 162 lb 3.2 oz (73.6 kg)   Body mass index is 25.54 kg/m.    Physical Exam Constitutional:      General: He is not in acute distress.    Appearance: Normal appearance. He is not ill-appearing.  HENT:     Head: Normocephalic and atraumatic.  Eyes:     Conjunctiva/sclera: Conjunctivae normal.  Cardiovascular:     Rate and Rhythm: Normal rate and regular rhythm.     Heart sounds: Normal heart sounds.  Pulmonary:     Effort: Pulmonary effort is normal. No respiratory distress.     Breath sounds: Normal breath sounds. No wheezing or rales.  Musculoskeletal:     Right lower leg: Edema (trace) present.     Left lower leg: Edema (1 +) present.  Skin:    General: Skin is warm and dry.     Findings: No rash.  Neurological:     Mental Status: He is alert. Mental status is at baseline.  Psychiatric:        Mood and Affect: Mood normal.        Lab Results  Component Value Date   WBC 6.1 04/03/2023   HGB 9.8 (L) 04/03/2023   HCT 30.0 (L) 04/03/2023   PLT 225.0 04/03/2023   GLUCOSE 226 (H) 04/03/2023   CHOL 123 10/02/2022   TRIG 111.0 10/02/2022   HDL 58.20 10/02/2022   LDLDIRECT 72.8 04/18/2011   LDLCALC 43 10/02/2022   ALT 11 04/03/2023   AST 14 04/03/2023   NA 138 04/03/2023   K 4.5 04/03/2023   CL 106 04/03/2023   CREATININE 1.39 04/03/2023   BUN 32 (H) 04/03/2023   CO2 26 04/03/2023   TSH 1.12 10/01/2019   PSA 0.6 09/22/2010   INR 2.0 09/05/2023   HGBA1C 6.1 04/03/2023   MICROALBUR 1.3 04/03/2022     Assessment & Plan:    See Problem List for Assessment and Plan of  chronic medical problems.

## 2023-10-01 NOTE — Patient Instructions (Signed)
[name] , Thank you for taking time to come for your Medicare Wellness Visit. I appreciate your ongoing commitment to your health goals. Please review the following plan we discussed and let me know if I can assist you in the future.   Referrals/Orders/Follow-Ups/Clinician Recommendations: You are due for a Tetanus vaccine and a annual eye exam.  It was nice to talk with you today.  Andrew [name] and keep up the good work.  This is a list of the screening recommended for you and due dates:  Health Maintenance  Topic Date Due   Complete foot exam   10/02/2023*   Eye exam for diabetics  12/21/2023*   DTaP/Tdap/Td vaccine (3 - Tdap) 01/21/2024*   Zoster (Shingles) Vaccine (1 of 2) 01/21/2024*   Hemoglobin A1C  10/03/2023   COVID-19 Vaccine (7 - 2023-24 season) 01/05/2024   Medicare Annual Wellness Visit  09/30/2024   Pneumonia Vaccine  Completed   Flu Shot  Completed   HPV Vaccine  Aged Out   Colon Cancer Screening  Discontinued  *Topic was postponed. The date shown is not the original due date.    Advanced directives: (In Chart) A copy of your advanced directives are scanned into your chart should your provider ever need it.  Next Medicare Annual Wellness Visit scheduled for next year: Yes

## 2023-10-01 NOTE — Progress Notes (Signed)
Subjective:   Andrew [name] is a 89 y.o. male who presents for Medicare Annual/Subsequent preventive examination.  Visit Complete: Virtual I connected with  Andrew [name] on 10/01/23 by a audio enabled telemedicine application and verified that I am speaking with the correct person using two identifiers.  Patient Location: Home  Provider Location: Home Office  I discussed the limitations of evaluation and management by telemedicine. The patient expressed understanding and agreed to proceed.  Vital Signs: Because this visit was a virtual/telehealth visit, some criteria may be missing or patient reported. Any vitals not documented were not able to be obtained and vitals that have been documented are patient reported.   Cardiac Risk Factors include: advanced age (>28men, >25 women);male gender;diabetes mellitus;Other (see comment);dyslipidemia, Risk factor comments: CKD, CAD     Objective:    Today's Vitals   10/01/23 1430 10/01/23 1431  Weight: 155 lb (70.3 kg)   Height: 5\' 8"  (1.727 m)   PainSc:  4    Body mass index is 23.57 kg/m.     10/01/2023    2:37 PM 09/29/2022    9:01 AM 06/03/2021    6:46 AM 05/19/2021    8:11 AM 05/18/2021    9:09 AM 09/27/2017   10:14 AM 06/15/2017    2:46 PM  Advanced Directives  Does Patient Have a Medical Advance Directive? Yes Yes Yes Yes Yes Yes Yes  Type of Estate agent of [place];Living will Healthcare Power of [place];Living will Living will Living will Living will Healthcare Power of [place];Living will Living will;Healthcare Power of Attorney  Does patient want to make changes to medical advance directive? No - Patient declined No - Patient declined No - Patient declined  No - Patient declined  No - Patient declined  Copy of Healthcare Power of Attorney in Chart? Yes - validated most recent copy scanned in chart (See row information) Yes - validated most recent copy scanned in chart (See row information)     No - copy requested No - copy requested    Current Medications (verified) Outpatient Encounter Medications as of 10/01/2023  Medication Sig   Acetaminophen (TYLENOL 8 HOUR PO) Take 1,000 mg by mouth 3 (three) times daily.   aspirin EC 81 MG tablet Take 81 mg by mouth daily.   atorvastatin (LIPITOR) 20 MG tablet TAKE 1 TABLET DAILY AT     6:00PM   carvedilol (COREG) 6.25 MG tablet Take 1 tablet (6.25 mg total) by mouth 2 (two) times daily with a meal.   cholecalciferol (VITAMIN D) 1000 UNITS tablet Take 1,000 Units by mouth daily.   HYDROcodone-acetaminophen (NORCO/VICODIN) 5-325 MG tablet Take 1 tablet by mouth every 6 (six) hours as needed for moderate pain.   metFORMIN (GLUCOPHAGE-XR) 500 MG 24 hr tablet TAKE 1 TABLET TWICE A DAY   Multiple Vitamin (MULTIVITAMIN WITH MINERALS) TABS tablet Take 1 tablet by mouth daily.   niacin (NIASPAN) 1000 MG CR tablet TAKE 1 TABLET AT BEDTIME   TURMERIC CURCUMIN PO Take 2,000 mg by mouth in the morning and at bedtime.   warfarin (COUMADIN) 2 MG tablet TAKE 1 TABLET TO 1.5 TABLETS BY MOUTH DAILY AS DIRECTED BY COUMADIN CLINIC   No facility-administered encounter medications on file as of 10/01/2023.    Allergies (verified) Penicillins   History: Past Medical History:  Diagnosis Date   Colonic polyp 2011 last colo   colo q 9yr - [facility]    Coronary artery disease 02/2009   a. 02/2009  Cath: severe left main and three-vessel-->CABG; b. 12/2016 MVL EF 49%, no isch/infarct.   DEGENERATIVE JOINT DISEASE    Diastolic dysfunction    a. 10/2021 Echo: EF 55-60%, no rwma, mod conc LVH, GrI DD, mildly reduced RV fxn, RVSP 26.8 mmHg. mild AI, and aortic sclerosis.   Diverticulosis of colon    DIVERTICULOSIS, COLON    History of kidney stones    HYPERLIPIDEMIA-MIXED    HYPERTENSION, BENIGN    Interstitial lung disease (HCC)    LBP (low back pain)    s/p decompression 8/14; ESI - [place]; RFA x 3 early 2015   Pacemaker    a. 05/2017: St. Jude (serial  number [id number]) pacemaker   Symptomatic bradycardia    a. s/p St. Jude (serial number [id number]) pacemaker 06/16/17   Type II or unspecified type diabetes mellitus without mention of complication, not stated as uncontrolled    VITAMIN D DEFICIENCY    Past Surgical History:  Procedure Laterality Date   CARPAL TUNNEL RELEASE Right    CATARACT EXTRACTION, BILATERAL     R 07/07/13, L 07/21/13   CORONARY ARTERY BYPASS GRAFT  03/17/09   x 4   CYSTOSCOPY WITH STENT PLACEMENT Left 05/10/2021   Procedure: CYSTOSCOPY WITH LEFT  STENT PLACEMENT,LEFT RETROGRADE PYELOGRAM;  Surgeon: Crist Fat, MD;  Location: [facility];  Service: Urology;  Laterality: Left;   CYSTOSCOPY/URETEROSCOPY/HOLMIUM LASER/STENT PLACEMENT Left 06/03/2021   Procedure: CYSTOSCOPY, LEFT RETROGRADE, URETEROSCOPY,HOLMIUM LASER,STENT EXCHANGE;  Surgeon: Crist Fat, MD;  Location: [facility];  Service: Urology;  Laterality: Left;   INGUINAL HERNIA REPAIR  1980   KNEE ARTHROSCOPY  2007   right   LUMBAR EPIDURAL INJECTION  2013   Has as needed.   LUMBAR LAMINECTOMY/DECOMPRESSION MICRODISCECTOMY N/A 06/19/2013   Procedure: LUMBAR LAMINECTOMY/DECOMPRESSION MICRODISCECTOMY;  Surgeon: Emilee Hero, MD;  Location[facility];  Service: Orthopedics;  Laterality: N/A;  Lumbar 4-5 decompression   PACEMAKER IMPLANT N/A 06/16/2017   Procedure: Pacemaker Implant;  Surgeon: Marinus Maw, MD;  Location: [facility] INVASIVE CV LAB;  Service: Cardiovascular;  Laterality: N/A;   TRIGGER FINGER RELEASE Right    Family History  Problem Relation Age of Onset   Heart failure Mother        died age 60   Heart failure Father        died age 23   Diabetes Other    Heart disease Neg Hx    Social History   Socioeconomic History   Marital status: Married    Spouse name: Andrew [name]   Number of children: 3   Years of education: Not on file   Highest education level: 12th grade  Occupational History   Not on file  Tobacco Use   Smoking status: Never    Smokeless tobacco: Never   Tobacco comments:    Married, 3 grown children. Retired 04/2002 from [employer] work  Psychologist, educational Use   Vaping status: Never Used  Substance and Sexual Activity   Alcohol use: Yes    Comment: RARE   Drug use: No   Sexual activity: Not Currently  Other Topics Concern   Not on file  Social History Narrative   Lives with wife and 1 son.   Social Determinants of Health   Financial Resource Strain: Low Risk  (10/01/2023)   Overall Financial Resource Strain (CARDIA)    Difficulty of Paying Living Expenses: Not hard at all  Food Insecurity: No Food Insecurity (10/01/2023)   Hunger Vital Sign  Worried About Programme researcher, broadcasting/film/video in the Last Year: Never true    Ran Out of Food in the Last Year: Never true  Transportation Needs: No Transportation Needs (10/01/2023)   PRAPARE - Administrator, Civil Service (Medical): No    Lack of Transportation (Non-Medical): No  Physical Activity: Insufficiently Active (10/01/2023)   Exercise Vital Sign    Days of Exercise per Week: 5 days    Minutes of Exercise per Session: 20 min  Stress: No Stress Concern Present (10/01/2023)   [employer] of Occupational Health - Occupational Stress Questionnaire    Feeling of Stress : Not at all  Social Connections: Moderately Isolated (10/01/2023)   Social Connection and Isolation Panel [NHANES]    Frequency of Communication with Friends and Family: Twice a week    Frequency of Social Gatherings with Friends and Family: Twice a week    Attends Religious Services: Never    Database administrator or Organizations: No    Attends Banker Meetings: Never    Marital Status: Married    Tobacco Counseling Counseling given: Not Answered Tobacco comments: Married, 3 grown children. Retired 04/2002 from [employer] work   Clinical Intake:  Pre-visit preparation completed: Yes  Pain : 0-10 Pain Score: 4  Pain Type: Chronic pain Pain  Location: Hip Pain Orientation: Right Pain Descriptors / Indicators: Aching, Discomfort Pain Onset: More than a month ago Pain Frequency: Constant     BMI - recorded: 23.57 Nutritional Status: BMI of 19-24  Normal Nutritional Risks: None Diabetes: Yes  How often do you need to have someone help you when you read instructions, pamphlets, or other written materials from your doctor or pharmacy?: 1 - Never  Interpreter Needed?: No  Information entered by :: Alizzon Dioguardi, RMA   Activities of Daily Living    10/01/2023    3:07 PM  In your present state of health, do you have any difficulty performing the following activities:  Hearing? 1  Comment wears hearing aides sometimes  Vision? 0  Difficulty concentrating or making decisions? 0  Walking or climbing stairs? 0  Dressing or bathing? 0  Doing errands, shopping? 0  Preparing Food and eating ? N  Using the Toilet? N  In the past six months, have you accidently leaked urine? N  Do you have problems with loss of bowel control? N  Managing your Medications? N  Managing your Finances? N  Housekeeping or managing your Housekeeping? N    Patient Care Team: Pincus Sanes, MD as PCP - General (Internal Medicine) Antonieta Iba, MD as PCP - Cardiology (Cardiology) Lunette Stands, MD as Consulting Physician (Orthopedic Surgery) Loreta Ave, MD (Inactive) as Consulting Physician (Orthopedic Surgery) Antonieta Iba, MD as Consulting Physician (Cardiology) Estill Bamberg, MD (Orthopedic Surgery) Hanley Seamen Dustin Folks, MD (Optometry) Carman Ching, MD (Inactive) (Gastroenterology) Donzetta Starch, MD as Consulting Physician (Dermatology)  Indicate any recent Medical Services you may have received from other than [facility] providers in the past year (date may be approximate).     Assessment:   This is a routine wellness examination for [place].  Hearing/Vision screen Hearing Screening - Comments:: Wears hearing aides  sometimes. Vision Screening - Comments:: Wears eyeglasses   Goals Addressed               This Visit's Progress     patient states (pt-stated)   On track     Maintain current health.  Depression Screen    10/01/2023    2:43 PM 04/03/2023    8:26 AM 10/02/2022    7:53 AM 09/29/2022    8:39 AM 05/29/2022    2:13 PM 04/03/2022    8:44 AM 12/30/2021   11:34 AM  PHQ 2/9 Scores  PHQ - 2 Score 0 0 0 0 2 0 0  PHQ- 9 Score 0 0 0  2 4     Fall Risk    10/01/2023    2:37 PM 04/03/2023    8:26 AM 10/02/2022    7:47 AM 09/29/2022    8:42 AM 05/29/2022    2:16 PM  Fall Risk   Falls in the past year? 1 0 0 0 0  Number falls in past yr: 1 0 0 0 0  Injury with Fall? 0 0 0 0 0  Risk for fall due to :  No Fall Risks No Fall Risks Impaired balance/gait;Impaired mobility   Follow up Falls prevention discussed;Falls evaluation completed Falls evaluation completed Falls evaluation completed Education provided;Falls evaluation completed     MEDICARE RISK AT HOME: Medicare Risk at Home Any stairs in or around the home?: Yes If so, are there any without handrails?: Yes Home free of loose throw rugs in walkways, pet beds, electrical cords, etc?: Yes Adequate lighting in your home to reduce risk of falls?: Yes Life alert?: No Use of a cane, walker or w/c?: Yes (cane) Grab bars in the bathroom?: No Shower chair or bench in shower?: No Elevated toilet seat or a handicapped toilet?: No  TIMED UP AND GO:  Was the test performed?  No    Cognitive Function:    09/27/2017   10:43 AM  MMSE - Mini Mental State Exam  Orientation to time 5  Orientation to Place 5  Registration 3  Attention/ Calculation 5  Recall 2  Language- name 2 objects 2  Language- repeat 1  Language- follow 3 step command 3  Language- read & follow direction 1  Write a sentence 1  Copy design 1  Total score 29        10/01/2023    2:38 PM 09/29/2022    8:42 AM 05/29/2022    2:15 PM  6CIT Screen  What Year? 0  points 0 points 0 points  What month? 0 points 0 points 0 points  What time? 0 points 0 points 0 points  Count back from 20 0 points 0 points 0 points  Months in reverse 0 points 0 points 0 points  Repeat phrase 0 points 0 points 0 points  Total Score 0 points 0 points 0 points    Immunizations Immunization History  Administered Date(s) Administered   Fluad Quad(high Dose 65+) 09/07/2023   Influenza Split 09/26/2012   Influenza, High Dose Seasonal PF 07/30/2018, 07/02/2019, 09/06/2020   Influenza, Seasonal, Injecte, Preservative Fre 07/23/2013   Influenza-Unspecified 09/24/2014, 07/24/2015, 08/04/2016, 08/13/2017, 08/09/2021, 08/11/2022   Moderna Covid-19 Vaccine Bivalent Booster 11yrs & up 09/11/2021   PFIZER Comirnaty(Gray Top)Covid-19 Tri-Sucrose Vaccine 08/31/2022   PFIZER(Purple Top)SARS-COV-2 Vaccination 12/01/2019, 12/24/2019, 09/06/2020   PNEUMOCOCCAL CONJUGATE-20 10/03/2021   Pfizer(Comirnaty)Fall Seasonal Vaccine 12 years and older 09/07/2023   Pneumococcal Conjugate-13 09/22/2015   Pneumococcal-Unspecified 09/25/2011   Td 11/07/2002, 03/26/2013    TDAP status: Due, Education has been provided regarding the importance of this vaccine. Advised may receive this vaccine at local pharmacy or Health Dept. Aware to provide a copy of the vaccination record if obtained from local pharmacy or Health  Dept. Verbalized acceptance and understanding.  Flu Vaccine status: Up to date  Pneumococcal vaccine status: Up to date  Covid-19 vaccine status: Information provided on how to obtain vaccines.   Qualifies for Shingles Vaccine? Yes   Zostavax completed No   Shingrix Completed?: No.    Education has been provided regarding the importance of this vaccine. Patient has been advised to call insurance company to determine out of pocket expense if they have not yet received this vaccine. Advised may also receive vaccine at local pharmacy or Health Dept. Verbalized acceptance and  understanding.  Screening Tests Health Maintenance  Topic Date Due   FOOT EXAM  10/02/2023 (Originally 07/06/2019)   OPHTHALMOLOGY EXAM  12/21/2023 (Originally 09/21/2023)   DTaP/Tdap/Td (3 - Tdap) 01/21/2024 (Originally 03/27/2023)   Zoster Vaccines- Shingrix (1 of 2) 01/21/2024 (Originally 06/14/1986)   HEMOGLOBIN A1C  10/03/2023   COVID-19 Vaccine (7 - 2023-24 season) 01/05/2024   Medicare Annual Wellness (AWV)  09/30/2024   Pneumonia Vaccine 30+ Years old  Completed   INFLUENZA VACCINE  Completed   HPV VACCINES  Aged Out   Colonoscopy  Discontinued    Health Maintenance  There are no preventive care reminders to display for this patient.   Colorectal cancer screening: No longer required.   Lung Cancer Screening: (Low Dose CT Chest recommended if Age 42-80 years, 20 pack-year currently smoking OR have quit w/in 15years.) does not qualify.   Lung Cancer Screening Referral: N/A  Additional Screening:  Hepatitis C Screening: does not qualify;   Vision Screening: Recommended annual ophthalmology exams for early detection of glaucoma and other disorders of the eye. Is the patient up to date with their annual eye exam?  No  Who is the provider or what is the name of the office in which the patient attends annual eye exams? Dr. Hanley Seamen If pt is not established with a provider, would they like to be referred to a provider to establish care? No .   Dental Screening: Recommended annual dental exams for proper oral hygiene  Diabetic Foot Exam: Diabetic Foot Exam: Overdue, Pt has been advised about the importance in completing this exam. Pt is scheduled for diabetic foot exam on 10/02/2023.  Community Resource Referral / Chronic Care Management: CRR required this visit?  No   CCM required this visit?  No     Plan:     I have personally reviewed and noted the following in the patient's chart:   Medical and social history Use of alcohol, tobacco or illicit drugs  Current  medications and supplements including opioid prescriptions. Patient is not currently taking opioid prescriptions. Functional ability and status Nutritional status Physical activity Advanced directives List of other physicians Hospitalizations, surgeries, and ER visits in previous 12 months Vitals Screenings to include cognitive, depression, and falls Referrals and appointments  In addition, I have reviewed and discussed with patient certain preventive protocols, quality metrics, and best practice recommendations. A written personalized care plan for preventive services as well as general preventive health recommendations were provided to patient.     Martha Ellerby L Katelind Pytel, CMA   10/01/2023   After Visit Summary: (MyChart) Due to this being a telephonic visit, the after visit summary with patients personalized plan was offered to patient via MyChart

## 2023-10-02 ENCOUNTER — Ambulatory Visit (INDEPENDENT_AMBULATORY_CARE_PROVIDER_SITE_OTHER): Payer: Medicare Other | Admitting: Internal Medicine

## 2023-10-02 VITALS — BP 114/60 | HR 69 | Temp 98.0°F | Ht 68.0 in | Wt 168.0 lb

## 2023-10-02 DIAGNOSIS — I25708 Atherosclerosis of coronary artery bypass graft(s), unspecified, with other forms of angina pectoris: Secondary | ICD-10-CM | POA: Diagnosis not present

## 2023-10-02 DIAGNOSIS — N1832 Chronic kidney disease, stage 3b: Secondary | ICD-10-CM

## 2023-10-02 DIAGNOSIS — E785 Hyperlipidemia, unspecified: Secondary | ICD-10-CM

## 2023-10-02 DIAGNOSIS — E1149 Type 2 diabetes mellitus with other diabetic neurological complication: Secondary | ICD-10-CM

## 2023-10-02 DIAGNOSIS — Z86718 Personal history of other venous thrombosis and embolism: Secondary | ICD-10-CM

## 2023-10-02 LAB — COMPREHENSIVE METABOLIC PANEL
ALT: 15 U/L (ref 0–53)
AST: 18 U/L (ref 0–37)
Albumin: 4 g/dL (ref 3.5–5.2)
Alkaline Phosphatase: 42 U/L (ref 39–117)
BUN: 42 mg/dL — ABNORMAL HIGH (ref 6–23)
CO2: 27 meq/L (ref 19–32)
Calcium: 9.1 mg/dL (ref 8.4–10.5)
Chloride: 107 meq/L (ref 96–112)
Creatinine, Ser: 1.43 mg/dL (ref 0.40–1.50)
GFR: 44.12 mL/min — ABNORMAL LOW (ref >60.00)
Glucose, Bld: 183 mg/dL — ABNORMAL HIGH (ref 70–99)
Potassium: 4.4 meq/L (ref 3.5–5.1)
Sodium: 142 meq/L (ref 135–145)
Total Bilirubin: 0.6 mg/dL (ref 0.2–1.2)
Total Protein: 6.5 g/dL (ref 6.0–8.3)

## 2023-10-02 LAB — CBC WITH DIFFERENTIAL/PLATELET
Basophils Absolute: 0 10*3/uL (ref 0.0–0.1)
Basophils Relative: 0.3 % (ref 0.0–3.0)
Eosinophils Absolute: 0.1 10*3/uL (ref 0.0–0.7)
Eosinophils Relative: 0.9 % (ref 0.0–5.0)
HCT: 35.7 % — ABNORMAL LOW (ref 39.0–52.0)
Hemoglobin: 11.8 g/dL — ABNORMAL LOW (ref 13.0–17.0)
Lymphocytes Relative: 19.9 % (ref 12.0–46.0)
Lymphs Abs: 1.3 10*3/uL (ref 0.7–4.0)
MCHC: 33.1 g/dL (ref 30.0–36.0)
MCV: 107.8 fL — ABNORMAL HIGH (ref 78.0–100.0)
Monocytes Absolute: 0.6 10*3/uL (ref 0.1–1.0)
Monocytes Relative: 9.2 % (ref 3.0–12.0)
Neutro Abs: 4.5 10*3/uL (ref 1.4–7.7)
Neutrophils Relative %: 69.7 % (ref 43.0–77.0)
Platelets: 209 10*3/uL (ref 150.0–400.0)
RBC: 3.32 Mil/uL — ABNORMAL LOW (ref 4.22–5.81)
RDW: 15 % (ref 11.5–15.5)
WBC: 6.5 10*3/uL (ref 4.0–10.5)

## 2023-10-02 LAB — HEMOGLOBIN A1C: Hgb A1c MFr Bld: 6.6 % — ABNORMAL HIGH (ref 4.6–6.5)

## 2023-10-02 LAB — LIPID PANEL
Cholesterol: 131 mg/dL (ref 0–200)
HDL: 53.6 mg/dL (ref >39.00)
LDL Cholesterol: 44 mg/dL (ref 0–99)
NonHDL: 77.44
Total CHOL/HDL Ratio: 2
Triglycerides: 166 mg/dL — ABNORMAL HIGH (ref 0.0–149.0)
VLDL: 33.2 mg/dL (ref 0.0–40.0)

## 2023-10-02 NOTE — Assessment & Plan Note (Signed)
Chronic   Lab Results  Component Value Date   HGBA1C 6.1 04/03/2023   Sugars controlled Check A1c Continue metformin XR 500 mg twice daily regular exercise, diabetic diet

## 2023-10-02 NOTE — Assessment & Plan Note (Signed)
Chronic Follow with cardiology No symptoms consistent with angina Continue aspirin 81 mg daily, atorvastatin 20 mg daily, Coreg 6.25 mg daily, Niaspan 1000 mg at bedtime

## 2023-10-02 NOTE — Assessment & Plan Note (Signed)
Chronic Stable CMP, CBC 

## 2023-10-02 NOTE — Assessment & Plan Note (Signed)
Chronic Regular exercise and healthy diet encouraged Check lipids  Continue atorvastatin 20 mg daily, Niacin 1000 mg nightly

## 2023-10-02 NOTE — Assessment & Plan Note (Signed)
Chronic DVT Management per cardiology On long-term anticoagulation with warfarin CBC today

## 2023-10-09 NOTE — Progress Notes (Signed)
Remote pacemaker transmission.   

## 2023-10-11 ENCOUNTER — Encounter (HOSPITAL_COMMUNITY): Payer: Self-pay | Admitting: Physical Medicine and Rehabilitation

## 2023-10-11 ENCOUNTER — Other Ambulatory Visit (HOSPITAL_COMMUNITY): Payer: Self-pay | Admitting: Physical Medicine and Rehabilitation

## 2023-10-11 DIAGNOSIS — M25551 Pain in right hip: Secondary | ICD-10-CM

## 2023-10-31 ENCOUNTER — Ambulatory Visit: Payer: Medicare Other | Attending: Internal Medicine

## 2023-10-31 DIAGNOSIS — I82409 Acute embolism and thrombosis of unspecified deep veins of unspecified lower extremity: Secondary | ICD-10-CM | POA: Diagnosis not present

## 2023-10-31 DIAGNOSIS — Z5181 Encounter for therapeutic drug level monitoring: Secondary | ICD-10-CM

## 2023-10-31 LAB — POCT INR: INR: 2 (ref 2.0–3.0)

## 2023-10-31 NOTE — Patient Instructions (Signed)
 Take 1 tablet Daily, except 2 tablets every Monday and Friday.    INR in 8 weeks.  [phone]

## 2023-11-02 ENCOUNTER — Other Ambulatory Visit: Payer: Self-pay | Admitting: Internal Medicine

## 2023-11-06 NOTE — Telephone Encounter (Signed)
 Copied from CRM [id number]. Topic: Clinical - Medication Question >> Nov 06, 2023  2:02 PM [name] wrote: Reason for CRM: Patient called in to check status of medication refill request for atorvastatin  (LIPITOR) 20 MG tablet but system shows medication was ordered on 1/13 and also discontinued by provider on 1/13, please reach out to patient for clarification. Thanks  Andrew [name] [phone]

## 2023-11-13 ENCOUNTER — Ambulatory Visit (HOSPITAL_COMMUNITY)
Admission: RE | Admit: 2023-11-13 | Discharge: 2023-11-13 | Disposition: A | Discharge status: Home / Self Care | Payer: Medicare Other | Source: Ambulatory Visit | Attending: Physical Medicine and Rehabilitation | Admitting: Physical Medicine and Rehabilitation

## 2023-11-13 DIAGNOSIS — M25551 Pain in right hip: Secondary | ICD-10-CM | POA: Diagnosis present

## 2023-11-13 MED ORDER — GADOBUTROL 1 MMOL/ML IV SOLN
7.0000 mL | Freq: Once | INTRAVENOUS | Status: AC | PRN
  Administered 2023-11-13: 7 mL via INTRAVENOUS

## 2023-11-30 LAB — HM DIABETES EYE EXAM

## 2023-12-10 ENCOUNTER — Telehealth: Payer: Self-pay | Admitting: Internal Medicine

## 2023-12-10 NOTE — Telephone Encounter (Signed)
Copied from CRM [id number]. Topic: Referral - Status >> Dec 10, 2023  9:09 AM Andrew [name] wrote: Reason for CRM: Patient is checking status on a call last week to start physical therapy diagnosis with gluteus minimus tear. Please call back at [phone].

## 2023-12-11 NOTE — Telephone Encounter (Signed)
 Spoke with patient today.

## 2023-12-12 ENCOUNTER — Ambulatory Visit (INDEPENDENT_AMBULATORY_CARE_PROVIDER_SITE_OTHER): Payer: Medicare Other

## 2023-12-12 DIAGNOSIS — I459 Conduction disorder, unspecified: Secondary | ICD-10-CM | POA: Diagnosis not present

## 2023-12-13 ENCOUNTER — Encounter: Payer: Self-pay | Admitting: Internal Medicine

## 2023-12-13 ENCOUNTER — Ambulatory Visit: Payer: Medicare Other | Admitting: Internal Medicine

## 2023-12-13 LAB — CUP PACEART REMOTE DEVICE CHECK
Battery Remaining Longevity: 51 mo
Battery Remaining Percentage: 41 %
Battery Voltage: 2.96 V
Brady Statistic AP VP Percent: 14 %
Brady Statistic AP VS Percent: 1 %
Brady Statistic AS VP Percent: 86 %
Brady Statistic AS VS Percent: 1 %
Brady Statistic RA Percent Paced: 13 %
Brady Statistic RV Percent Paced: 99 %
Date Time Interrogation Session: 20250219030052
Implantable Lead Connection Status: 753985
Implantable Lead Connection Status: 753985
Implantable Lead Implant Date: 20180825
Implantable Lead Implant Date: 20180825
Implantable Lead Location: 753859
Implantable Lead Location: 753860
Implantable Pulse Generator Implant Date: 20180825
Lead Channel Impedance Value: 430 Ohm
Lead Channel Impedance Value: 600 Ohm
Lead Channel Pacing Threshold Amplitude: 0.5 V
Lead Channel Pacing Threshold Amplitude: 0.625 V
Lead Channel Pacing Threshold Pulse Width: 0.5 ms
Lead Channel Pacing Threshold Pulse Width: 0.5 ms
Lead Channel Sensing Intrinsic Amplitude: 3.3 mV
Lead Channel Sensing Intrinsic Amplitude: 4.6 mV
Lead Channel Setting Pacing Amplitude: 0.875
Lead Channel Setting Pacing Amplitude: 2 V
Lead Channel Setting Pacing Pulse Width: 0.5 ms
Lead Channel Setting Sensing Sensitivity: 4 mV
Pulse Gen Model: 2272
Pulse Gen Serial Number: 8939923

## 2023-12-20 NOTE — Progress Notes (Signed)
 Subjective:    [name], male    DOB: 1934-03-20, 89 y.o.   MRN: [id number]      HPI Andrew [name] is here for  Chief Complaint  Patient presents with   Hip Pain    Pt states that he has right hip pain - pt was referred to PT ([facility]) and is unhappy with his care there. Pt would like to be referred to another PT facility.    Extremity Weakness    Pt has weakness in legs and would like another referral for this, pt states he has no issue with breathing, and its more in his legs that he has concern with, standing and walking only for weakness.   History of hematoma right gluteus medius-02/2023.   MRI hip 10/2023-no hematoma, complete tear of right gluteus minimus tendon, mild tendinosis of right gluteus medius tendon.  Mild right greater trochanteric bursitis.  Moderate partial-thickness cartilage loss of the femoral head and acetabulum bilaterally  Following with Dr. Maurice Small.  Doing PT at [facility] orthopedic specialist - eval and treat, therapeutic exercise  - gluteus minimus tear   Medications and allergies reviewed with patient and updated if appropriate.  Current Outpatient Medications on File Prior to Visit  Medication Sig Dispense Refill   Acetaminophen (TYLENOL 8 HOUR PO) Take 1,000 mg by mouth 3 (three) times daily.     aspirin EC 81 MG tablet Take 81 mg by mouth daily.     atorvastatin (LIPITOR) 20 MG tablet TAKE 1 TABLET DAILY AT     6:00PM 90 tablet 1   carvedilol (COREG) 6.25 MG tablet Take 1 tablet (6.25 mg total) by mouth 2 (two) times daily with a meal. 180 tablet 3   cholecalciferol (VITAMIN D) 1000 UNITS tablet Take 1,000 Units by mouth daily.     HYDROcodone-acetaminophen (NORCO/VICODIN) 5-325 MG tablet Take 1 tablet by mouth every 6 (six) hours as needed for moderate pain.     metFORMIN (GLUCOPHAGE-XR) 500 MG 24 hr tablet TAKE 1 TABLET TWICE A DAY 180 tablet 3   Multiple Vitamin (MULTIVITAMIN WITH MINERALS) TABS tablet Take 1 tablet by  mouth daily.     niacin (NIASPAN) 1000 MG CR tablet TAKE 1 TABLET AT BEDTIME 90 tablet 3   TURMERIC CURCUMIN PO Take 2,000 mg by mouth in the morning and at bedtime.     warfarin (COUMADIN) 2 MG tablet TAKE 1 TABLET TO 1.5 TABLETS BY MOUTH DAILY AS DIRECTED BY COUMADIN CLINIC 120 tablet 0   No current facility-administered medications on file prior to visit.    Review of Systems     Objective:   Vitals:   12/21/23 1359  BP: 128/88  Pulse: 66  Temp: 97.8 F (36.6 C)  SpO2: 97%   BP Readings from Last 3 Encounters:  12/21/23 128/88  10/02/23 114/60  07/09/23 (!) 140/78   Wt Readings from Last 3 Encounters:  12/21/23 161 lb 12.8 oz (73.4 kg)  10/02/23 168 lb (76.2 kg)  10/01/23 155 lb (70.3 kg)   Body mass index is 24.6 kg/m.    Physical Exam Constitutional:      General: He is not in acute distress.    Appearance: Normal appearance. He is not ill-appearing.  HENT:     Head: Normocephalic and atraumatic.  Skin:    General: Skin is warm and dry.  Neurological:     Mental Status: He is alert. Mental status is at baseline.     Sensory: No  sensory deficit.     Motor: No weakness (b/l leg strength normal).     Gait: Gait abnormal (secondary to right hip pain).  Psychiatric:        Mood and Affect: Mood normal.        Behavior: Behavior normal.        Thought Content: Thought content normal.        Judgment: Judgment normal.          Assessment & Plan:    See Problem List for Assessment and Plan of chronic medical problems.

## 2023-12-21 ENCOUNTER — Ambulatory Visit (INDEPENDENT_AMBULATORY_CARE_PROVIDER_SITE_OTHER): Payer: Medicare Other | Admitting: Internal Medicine

## 2023-12-21 ENCOUNTER — Encounter: Payer: Self-pay | Admitting: Internal Medicine

## 2023-12-21 VITALS — BP 128/88 | HR 66 | Temp 97.8°F | Ht 68.0 in | Wt 161.8 lb

## 2023-12-21 DIAGNOSIS — S76011D Strain of muscle, fascia and tendon of right hip, subsequent encounter: Secondary | ICD-10-CM

## 2023-12-21 DIAGNOSIS — R29898 Other symptoms and signs involving the musculoskeletal system: Secondary | ICD-10-CM

## 2023-12-21 DIAGNOSIS — S76011A Strain of muscle, fascia and tendon of right hip, initial encounter: Secondary | ICD-10-CM | POA: Insufficient documentation

## 2023-12-21 NOTE — Patient Instructions (Addendum)
      A referral was ordered physical therapy and someone will call you to schedule an appointment.     Return for follow up as scheduled.

## 2023-12-23 NOTE — Assessment & Plan Note (Signed)
 Chronic Likely related to chronic back pain and right hip/glut issues Has seen ortho Recent MRI with multiple issues Will refer for PT He will follow up with ortho after completing PT

## 2023-12-23 NOTE — Assessment & Plan Note (Addendum)
 New Seen on recent MRI Referral for PT Pain medication per ortho

## 2023-12-26 ENCOUNTER — Ambulatory Visit: Payer: Medicare Other | Attending: Internal Medicine

## 2023-12-26 DIAGNOSIS — I82409 Acute embolism and thrombosis of unspecified deep veins of unspecified lower extremity: Secondary | ICD-10-CM | POA: Diagnosis not present

## 2023-12-26 DIAGNOSIS — Z5181 Encounter for therapeutic drug level monitoring: Secondary | ICD-10-CM | POA: Diagnosis not present

## 2023-12-26 LAB — POCT INR: INR: 1.9 — AB (ref 2.0–3.0)

## 2023-12-26 NOTE — Patient Instructions (Signed)
 Take 1 tablet Daily, except 2 tablets every Monday and Friday.    INR in 8 weeks.  [phone]

## 2023-12-27 NOTE — Therapy (Signed)
 OUTPATIENT PHYSICAL THERAPY LOWER EXTREMITY EVALUATION  Patient Name: Andrew [name] MRN: [id number] DOB:Jan 01, 1934, 89 y.o., male Today's Date: 12/28/2023   PT End of Session - 12/28/23 0906     Visit Number 1    Number of Visits --   1-2x/week   Date for PT Re-Evaluation 02/22/24    Authorization Type [employer] MCR - LEFS    Progress Note Due on Visit 10    PT Start Time 0745    PT Stop Time 0836    PT Time Calculation (min) 51 min             Past Medical History:  Diagnosis Date   Colonic polyp 2011 last colo   colo q 74yr - [facility]    Coronary artery disease 02/2009   a. 02/2009 Cath: severe left main and three-vessel-->CABG; b. 12/2016 MVL EF 49%, no isch/infarct.   DEGENERATIVE JOINT DISEASE    Diastolic dysfunction    a. 10/2021 Echo: EF 55-60%, no rwma, mod conc LVH, GrI DD, mildly reduced RV fxn, RVSP 26.8 mmHg. mild AI, and aortic sclerosis.   Diverticulosis of colon    DIVERTICULOSIS, COLON    History of kidney stones    HYPERLIPIDEMIA-MIXED    HYPERTENSION, BENIGN    Interstitial lung disease (HCC)    LBP (low back pain)    s/p decompression 8/14; ESI - [place]; RFA x 3 early 2015   Pacemaker    a. 05/2017: St. Jude (serial number [id number]) pacemaker   Symptomatic bradycardia    a. s/p St. Jude (serial number [id number]) pacemaker 06/16/17   Type II or unspecified type diabetes mellitus without mention of complication, not stated as uncontrolled    VITAMIN D DEFICIENCY    Past Surgical History:  Procedure Laterality Date   CARPAL TUNNEL RELEASE Right    CATARACT EXTRACTION, BILATERAL     R 07/07/13, L 07/21/13   CORONARY ARTERY BYPASS GRAFT  03/17/09   x 4   CYSTOSCOPY WITH STENT PLACEMENT Left 05/10/2021   Procedure: CYSTOSCOPY WITH LEFT  STENT PLACEMENT,LEFT RETROGRADE PYELOGRAM;  Surgeon: Crist Fat, MD;  Location: [facility];  Service: Urology;  Laterality: Left;   CYSTOSCOPY/URETEROSCOPY/HOLMIUM LASER/STENT PLACEMENT Left 06/03/2021   Procedure:  CYSTOSCOPY, LEFT RETROGRADE, URETEROSCOPY,HOLMIUM LASER,STENT EXCHANGE;  Surgeon: Crist Fat, MD;  Location: [facility];  Service: Urology;  Laterality: Left;   INGUINAL HERNIA REPAIR  1980   KNEE ARTHROSCOPY  2007   right   LUMBAR EPIDURAL INJECTION  2013   Has as needed.   LUMBAR LAMINECTOMY/DECOMPRESSION MICRODISCECTOMY N/A 06/19/2013   Procedure: LUMBAR LAMINECTOMY/DECOMPRESSION MICRODISCECTOMY;  Surgeon: Emilee Hero, MD;  Location: [facility] OR;  Service: Orthopedics;  Laterality: N/A;  Lumbar 4-5 decompression   PACEMAKER IMPLANT N/A 06/16/2017   Procedure: Pacemaker Implant;  Surgeon: Marinus Maw, MD;  Location: [facility] INVASIVE CV LAB;  Service: Cardiovascular;  Laterality: N/A;   TRIGGER FINGER RELEASE Right    Patient Active Problem List   Diagnosis Date Noted   Tear of right gluteus minimus tendon 12/21/2023   Anemia 04/02/2023   Hematoma 03/18/2023   Supratherapeutic INR 03/18/2023   Pulmonary fibrosis (HCC) 04/02/2022   Poor balance 12/28/2021   Bilateral leg weakness 04/01/2021   CKD (chronic kidney disease) stage 3, GFR 30-59 ml/min (HCC) 03/31/2021   Pacemaker 07/21/2019   History of deep venous thrombosis (DVT) of distal vein of left lower extremity 08/28/2017   Complete heart block (HCC) - PPM 06/15/2017   Wears hearing aid 09/25/2016  Degeneration of lumbar or lumbosacral intervertebral disc 03/23/2015   Vitamin D deficiency 11/03/2010   Diverticulosis of colon 11/03/2010   Osteoarthritis (arthritis due to wear and tear of joints) 11/03/2010   Dyslipidemia, goal LDL below 70 12/01/2009   CAD, ARTERY BYPASS GRAFT 12/01/2009   DOE (dyspnea on exertion) 12/01/2009   Type 2 diabetes mellitus with neurological manifestations, controlled (HCC)     PCP: Pincus Sanes, MD  REFERRING PROVIDER: Pincus Sanes, MD  THERAPY DIAG:  Other low back pain  Pain in right hip  Muscle weakness  Unsteadiness on feet  REFERRING DIAG: Tear of right gluteus minimus  tendon, subsequent encounter [S76.011D], Bilateral leg weakness [R29.898]   Rationale for Evaluation and Treatment:  Rehabilitation  SUBJECTIVE:  PERTINENT PAST HISTORY:  DM, CAD with bypass 2017, DOE, laminectomy, pacemaker, hx of DVT, complete tear of R glute min, blood thinner      PRECAUTIONS: pacemaker  WEIGHT BEARING RESTRICTIONS No  FALLS:  Has patient fallen in last 6 months? No, Number of falls: 0, but feels unstable  MOI/History of condition:  Onset date: May 2024  SUBJECTIVE STATEMENT  Andrew [name] is a 89 y.o. male who presents to clinic with chief complaint of R hip pain.  This started suddenly with with no clear injury.  He went out to dinner one night and woke up the next day and was unable to bear weight on his R leg.  He ultimately had an MRI which showed a glute min tear.  He went to another PT location but feels they cater to younger people and push him too hard.  Pt lives with wife who is in a w/c after a fall and hip fracture.  He is the main caregiver.  He lives with his son who is somewhat helpful.  He states he feel like he may catch the toes of his R foot when walking at times.   Red flags:  denies   Pain:  Are you having pain? Yes Pain location: posterior hip  NPRS scale:  2/10 to 10/10 Aggravating factors: standing, walking Relieving factors: sitting, resting Pain description: sharp and aching Stage: Chronic 24 hour pattern: stiff in morning, worse with activity   Occupation: retired  Education administrator: [employer]  Hand Dominance: NA  Patient Goals/Specific Activities: balance, strength, pain   OBJECTIVE:   DIAGNOSTIC FINDINGS:  MRI hip 10/2023-no hematoma, complete tear of right gluteus minimus tendon, mild tendinosis of right gluteus medius tendon.  Mild right greater trochanteric bursitis.  Moderate partial-thickness cartilage loss of the femoral head and acetabulum bilaterally   GENERAL OBSERVATION/GAIT: Antalgic gait with reduced  time in stance on R  SENSATION: Light touch: Appears intact  PALPATION: Minimal TTP R hip  MUSCLE LENGTH: Hamstrings: Right significant, w/ pain restriction; Left subtle restriction   LE MMT:  MMT Right (Eval) Left (Eval)  Hip flexion (L2, L3) 3+* 4  Knee extension (L3) 4 4  Knee flexion 3+ 4  Hip abduction 3* 3+  Hip extension    Hip external rotation    Hip internal rotation    Hip adduction    Ankle dorsiflexion (L4) 4 3-  Ankle plantarflexion (S1) 4 nt  Ankle inversion    Ankle eversion    Great Toe ext (L5)    Grossly     (Blank rows = not tested, score listed is out of 5 possible points.  N = WNL, D = diminished, C = clear for gross weakness with myotome testing, * =  concordant pain with testing)  LE ROM:  ROM Right (Eval) Left (Eval)  Hip flexion Limited to 90 Limited to 90  Hip extension    Hip abduction    Hip adduction    Hip internal rotation 0 no pain 0 no pain  Hip external rotation Limited no pain Limited  No pain  Knee extension    Knee flexion    Ankle dorsiflexion    Ankle plantarflexion    Ankle inversion    Ankle eversion     (Blank rows = not tested, N = WNL, * = concordant pain with testing)  Functional Tests  Eval    30'' STS: 8x  UE used? Y    TUG: 17''                                                      PATIENT SURVEYS:  LEFS: 23/80  TODAY'S TREATMENT:  Therapeutic Exercise: Creating, reviewing, and completing below HEP  PATIENT EDUCATION ([place]/[facility]):  POC, diagnosis, prognosis, HEP, and outcome measures.  Pt educated via explanation, demonstration, and handout (HEP).  Pt confirms understanding verbally. We talked extensively about possible adaptations in the short term including getting a bar stool to use while cooking and a DF assist brace for the L foot.  HOME EXERCISE PROGRAM: Access Code: [email or web address] URL: [email or web address] Date: 12/28/2023 Prepared by: Alphonzo Severance  Exercises -  Supine Lower Trunk Rotation  - 1 x daily - 7 x weekly - 1 sets - 20 reps - 3 hold - Supine Hip Adduction Isometric with Ball  - 1 x daily - 7 x weekly - 2 sets - 10 reps - 10'' hold - Hooklying Isometric Clamshell  - 1 x daily - 7 x weekly - 3 sets - 10 reps  Treatment priorities   Eval        balance        DF assist brace, stool for cooking        General LE and core strength as able                          ASSESSMENT:  CLINICAL IMPRESSION: Mukhtar is a 89 y.o. male who presents to clinic with signs and sxs consistent with bil LE weakness, low back, and R hip pain.  Pt exam is consistent with main pain generator coming from low back vs R hip.  Pt has minimal recreation of sxs with hip ROM, finds hip flexion relieving, no MOI, and has only minimal TTP over R lateral hip.  He has a history of loss of disk height throughout lumbar spine, pain in the superior posterior gluteals, and improvement with steroid injections.  He does show significant weakness in L DF.  We talked extensively about possible adaptations in the short term including getting a bar stool to use while cooking and a DF assist brace for the L foot.  His objective exam puts him at increased fall risk per 5x STS and TUG.  He will benefit from PT to address relevant deficits and improve his safety and comfort during daily tasks.  OBJECTIVE IMPAIRMENTS: Pain, hip ROM, lumbar ROM, hip and and LE strength, gait, balance  ACTIVITY LIMITATIONS: standing, walking, housework, caring for wife, step navigation  PERSONAL FACTORS: See medical history and pertinent history  REHAB POTENTIAL: Good  CLINICAL DECISION MAKING: Evolving/moderate complexity  EVALUATION COMPLEXITY: Moderate   GOALS:   SHORT TERM GOALS: Target date: 01/25/2024  Jabarri will be >75% HEP compliant to improve carryover between sessions and facilitate independent management of condition  Evaluation: ongoing Goal status: INITIAL   LONG TERM GOALS: Target  date: 02/22/2024   Yidel will self report >/= 50% decrease in pain from evaluation to improve function in daily tasks  Evaluation/Baseline: 10/10 max pain Goal status: INITIAL   2.  Arthur will lower TUG score (MCID 3.4'') to </=12'' to show reduced risk of falls   Evaluation/Baseline: 17'' Goal status: INITIAL   3.  Jakob will improve 30'' STS (MCID 2) to >/= 10x (w/ UE?: Y) to show improved LE strength and improved transfers   Evaluation/Baseline: 8x  w/ UE? Y Goal status: INITIAL   4.  Gale will reports significant improvement in ability to stand to cook meals  Evaluation/Baseline: requires frequent seated rest breaks Goal status: INITIAL   5.  Oreste will show a >/= 18 pt improvement in LEFS score (MCID is ~11% or 9 pts) as a proxy for functional improvement   Evaluation/Baseline: 23 pts Goal status: INITIAL   PLAN: PT FREQUENCY: 1-2x/week  PT DURATION: 8 weeks  PLANNED INTERVENTIONS:  [id number]- PT Re-evaluation, 97110-Therapeutic exercises, 97530- Therapeutic activity, [id number]- Neuromuscular re-education, 97535- Self Care, [id number]- Manual therapy, [id number]- Gait training, [id number]- Aquatic Therapy, [phone]- Electrical stimulation (manual), [id number]- Vasopneumatic device, [id number]- Traction (mechanical), [id number]- Ionotophoresis 4mg /ml Dexamethasone, Taping, Dry Needling, Joint manipulation, and Spinal manipulation.   Alphonzo Severance PT, DPT 12/28/2023, 9:08 AM  Date of referral: 12/21/2023 Referring provider: Cheryll Cockayne Referring diagnosis? Tear of right gluteus minimus tendon, subsequent encounter [S76.011D], Bilateral leg weakness [R29.898]  Treatment diagnosis? (if different than referring diagnosis)     What was this (referring dx) caused by? Unspecified  Nature of Condition: Chronic (continuous duration > 3 months)   Laterality: Rt  Current Functional Measure Score: LEFS 23/80  Objective measurements identify impairments when they are compared to normal values, the  uninvolved extremity, and prior level of function.  [x]  Yes  []  No  Objective assessment of functional ability: Moderate functional limitations   Briefly describe symptoms: LE weakness and R sided hip/LBP which reduces ability to walk, stand, and perform housework  How did symptoms start: chronic with no clear MOI  Average pain intensity:  Last 24 hours: 8/10  Past week: 8/10  How often does the pt experience symptoms? Frequently  How much have the symptoms interfered with usual daily activities? Quite a bit  How has condition changed since care began at this facility? NA - initial visit  In general, how is the patients overall health? Fair   BACK PAIN (STarT Back Screening Tool) Has pain spread down the leg(s) at some time in the last 2 weeks? N Has there been pain in the shoulder or neck at some time in the last 2 weeks? N Has the pt only walked short distances because of back pain? Y Has patient dressed more slowly because of back pain in the past 2 weeks? Y Does patient think it's not safe for a person with this condition to be physically active? N Does patient have worrying thoughts a lot of the time? N Does patient feel back pain is terrible and will never get any better? N Has patient stopped enjoying things they usually enjoy? N

## 2023-12-28 ENCOUNTER — Ambulatory Visit: Attending: Internal Medicine | Admitting: Physical Therapy

## 2023-12-28 ENCOUNTER — Other Ambulatory Visit: Payer: Self-pay | Admitting: Cardiovascular Disease

## 2023-12-28 ENCOUNTER — Encounter: Payer: Self-pay | Admitting: Physical Therapy

## 2023-12-28 DIAGNOSIS — M5459 Other low back pain: Secondary | ICD-10-CM | POA: Insufficient documentation

## 2023-12-28 DIAGNOSIS — M6281 Muscle weakness (generalized): Secondary | ICD-10-CM | POA: Diagnosis present

## 2023-12-28 DIAGNOSIS — S76011D Strain of muscle, fascia and tendon of right hip, subsequent encounter: Secondary | ICD-10-CM | POA: Insufficient documentation

## 2023-12-28 DIAGNOSIS — R29898 Other symptoms and signs involving the musculoskeletal system: Secondary | ICD-10-CM | POA: Insufficient documentation

## 2023-12-28 DIAGNOSIS — M25551 Pain in right hip: Secondary | ICD-10-CM | POA: Insufficient documentation

## 2023-12-28 DIAGNOSIS — R2681 Unsteadiness on feet: Secondary | ICD-10-CM | POA: Insufficient documentation

## 2023-12-28 DIAGNOSIS — I82409 Acute embolism and thrombosis of unspecified deep veins of unspecified lower extremity: Secondary | ICD-10-CM

## 2023-12-28 NOTE — Telephone Encounter (Signed)
 Prescription refill request received for warfarin Lov: 07/09/23 Mariah Milling)  Next INR check: 02/20/24 Warfarin tablet strength: 2mg   Appropriate dose. Refill sent.

## 2024-01-01 ENCOUNTER — Ambulatory Visit: Admitting: Physical Therapy

## 2024-01-01 ENCOUNTER — Encounter: Payer: Self-pay | Admitting: Physical Therapy

## 2024-01-01 DIAGNOSIS — M5459 Other low back pain: Secondary | ICD-10-CM | POA: Diagnosis not present

## 2024-01-01 DIAGNOSIS — R2681 Unsteadiness on feet: Secondary | ICD-10-CM

## 2024-01-01 DIAGNOSIS — M6281 Muscle weakness (generalized): Secondary | ICD-10-CM

## 2024-01-01 DIAGNOSIS — M25551 Pain in right hip: Secondary | ICD-10-CM

## 2024-01-01 NOTE — Therapy (Signed)
 OUTPATIENT PHYSICAL THERAPY DAILY NOTE  Patient Name: Andrew [name] MRN: [id number] DOB:1934/06/04, 89 y.o., male Today's Date: 01/01/2024   PT End of Session - 01/01/24 1141     Visit Number 2    Number of Visits --   1-2x/week   Date for PT Re-Evaluation 02/22/24    Authorization Type [employer] MCR - LEFS    Progress Note Due on Visit 10    PT Start Time 1140   pt arrived late   PT Stop Time 1218    PT Time Calculation (min) 38 min             Past Medical History:  Diagnosis Date   Colonic polyp 2011 last colo   colo q 60yr - [facility]    Coronary artery disease 02/2009   a. 02/2009 Cath: severe left main and three-vessel-->CABG; b. 12/2016 MVL EF 49%, no isch/infarct.   DEGENERATIVE JOINT DISEASE    Diastolic dysfunction    a. 10/2021 Echo: EF 55-60%, no rwma, mod conc LVH, GrI DD, mildly reduced RV fxn, RVSP 26.8 mmHg. mild AI, and aortic sclerosis.   Diverticulosis of colon    DIVERTICULOSIS, COLON    History of kidney stones    HYPERLIPIDEMIA-MIXED    HYPERTENSION, BENIGN    Interstitial lung disease (HCC)    LBP (low back pain)    s/p decompression 8/14; ESI - [place]; RFA x 3 early 2015   Pacemaker    a. 05/2017: St. Jude (serial number [id number]) pacemaker   Symptomatic bradycardia    a. s/p St. Jude (serial number [id number]) pacemaker 06/16/17   Type II or unspecified type diabetes mellitus without mention of complication, not stated as uncontrolled    VITAMIN D DEFICIENCY    Past Surgical History:  Procedure Laterality Date   CARPAL TUNNEL RELEASE Right    CATARACT EXTRACTION, BILATERAL     R 07/07/13, L 07/21/13   CORONARY ARTERY BYPASS GRAFT  03/17/09   x 4   CYSTOSCOPY WITH STENT PLACEMENT Left 05/10/2021   Procedure: CYSTOSCOPY WITH LEFT  STENT PLACEMENT,LEFT RETROGRADE PYELOGRAM;  Surgeon: Crist Fat, MD;  Location: [facility];  Service: Urology;  Laterality: Left;   CYSTOSCOPY/URETEROSCOPY/HOLMIUM LASER/STENT PLACEMENT Left 06/03/2021   Procedure:  CYSTOSCOPY, LEFT RETROGRADE, URETEROSCOPY,HOLMIUM LASER,STENT EXCHANGE;  Surgeon: Crist Fat, MD;  Location: [facility];  Service: Urology;  Laterality: Left;   INGUINAL HERNIA REPAIR  1980   KNEE ARTHROSCOPY  2007   right   LUMBAR EPIDURAL INJECTION  2013   Has as needed.   LUMBAR LAMINECTOMY/DECOMPRESSION MICRODISCECTOMY N/A 06/19/2013   Procedure: LUMBAR LAMINECTOMY/DECOMPRESSION MICRODISCECTOMY;  Surgeon: Emilee Hero, MD;  Location: [facility] OR;  Service: Orthopedics;  Laterality: N/A;  Lumbar 4-5 decompression   PACEMAKER IMPLANT N/A 06/16/2017   Procedure: Pacemaker Implant;  Surgeon: Marinus Maw, MD;  Location: [facility] INVASIVE CV LAB;  Service: Cardiovascular;  Laterality: N/A;   TRIGGER FINGER RELEASE Right    Patient Active Problem List   Diagnosis Date Noted   Tear of right gluteus minimus tendon 12/21/2023   Anemia 04/02/2023   Hematoma 03/18/2023   Supratherapeutic INR 03/18/2023   Pulmonary fibrosis (HCC) 04/02/2022   Poor balance 12/28/2021   Bilateral leg weakness 04/01/2021   CKD (chronic kidney disease) stage 3, GFR 30-59 ml/min (HCC) 03/31/2021   Pacemaker 07/21/2019   History of deep venous thrombosis (DVT) of distal vein of left lower extremity 08/28/2017   Complete heart block (HCC) - PPM 06/15/2017   Wears  hearing aid 09/25/2016   Degeneration of lumbar or lumbosacral intervertebral disc 03/23/2015   Vitamin D deficiency 11/03/2010   Diverticulosis of colon 11/03/2010   Osteoarthritis (arthritis due to wear and tear of joints) 11/03/2010   Dyslipidemia, goal LDL below 70 12/01/2009   CAD, ARTERY BYPASS GRAFT 12/01/2009   DOE (dyspnea on exertion) 12/01/2009   Type 2 diabetes mellitus with neurological manifestations, controlled (HCC)     PCP: Pincus Sanes, MD  REFERRING PROVIDER: Pincus Sanes, MD  THERAPY DIAG:  Other low back pain  Pain in right hip  Muscle weakness  Unsteadiness on feet  REFERRING DIAG: Tear of right gluteus minimus  tendon, subsequent encounter [S76.011D], Bilateral leg weakness [R29.898]   Rationale for Evaluation and Treatment:  Rehabilitation  SUBJECTIVE:  PERTINENT PAST HISTORY:  DM, CAD with bypass 2017, DOE, laminectomy, pacemaker, hx of DVT, complete tear of R glute min, blood thinner      PRECAUTIONS: pacemaker  WEIGHT BEARING RESTRICTIONS No  FALLS:  Has patient fallen in last 6 months? No, Number of falls: 0, but feels unstable  MOI/History of condition:  Onset date: May 2024  SUBJECTIVE STATEMENT  01/01/2024: Pt reports that he was feeling under the weather this weekend but is feeling better now.  He has completed his HEP a few times but his was limited d/t feeling sick.  EVAL:  Andrew [name] is a 89 y.o. male who presents to clinic with chief complaint of R hip pain.  This started suddenly with with no clear injury.  He went out to dinner one night and woke up the next day and was unable to bear weight on his R leg.  He ultimately had an MRI which showed a glute min tear.  He went to another PT location but feels they cater to younger people and push him too hard.  Pt lives with wife who is in a w/c after a fall and hip fracture.  He is the main caregiver.  He lives with his son who is somewhat helpful.  He states he feel like he may catch the toes of his R foot when walking at times.   Red flags:  denies   Pain:  Are you having pain? Yes Pain location: posterior hip  NPRS scale:  2/10 to 10/10 Aggravating factors: standing, walking Relieving factors: sitting, resting Pain description: sharp and aching Stage: Chronic 24 hour pattern: stiff in morning, worse with activity   Occupation: retired  Education administrator: [employer]  Hand Dominance: NA  Patient Goals/Specific Activities: balance, strength, pain   OBJECTIVE:   DIAGNOSTIC FINDINGS:  MRI hip 10/2023-no hematoma, complete tear of right gluteus minimus tendon, mild tendinosis of right gluteus medius tendon.  Mild  right greater trochanteric bursitis.  Moderate partial-thickness cartilage loss of the femoral head and acetabulum bilaterally   GENERAL OBSERVATION/GAIT: Antalgic gait with reduced time in stance on R  SENSATION: Light touch: Appears intact  PALPATION: Minimal TTP R hip  MUSCLE LENGTH: Hamstrings: Right significant, w/ pain restriction; Left subtle restriction   LE MMT:  MMT Right (Eval) Left (Eval)  Hip flexion (L2, L3) 3+* 4  Knee extension (L3) 4 4  Knee flexion 3+ 4  Hip abduction 3* 3+  Hip extension    Hip external rotation    Hip internal rotation    Hip adduction    Ankle dorsiflexion (L4) 4 3-  Ankle plantarflexion (S1) 4 nt  Ankle inversion    Ankle eversion  Great Toe ext (L5)    Grossly     (Blank rows = not tested, score listed is out of 5 possible points.  N = WNL, D = diminished, C = clear for gross weakness with myotome testing, * = concordant pain with testing)  LE ROM:  ROM Right (Eval) Left (Eval)  Hip flexion Limited to 90 Limited to 90  Hip extension    Hip abduction    Hip adduction    Hip internal rotation 0 no pain 0 no pain  Hip external rotation Limited no pain Limited  No pain  Knee extension    Knee flexion    Ankle dorsiflexion    Ankle plantarflexion    Ankle inversion    Ankle eversion     (Blank rows = not tested, N = WNL, * = concordant pain with testing)  Functional Tests  Eval    30'' STS: 8x  UE used? Y    TUG: 17''                                                      PATIENT SURVEYS:  LEFS: 23/80  TODAY'S TREATMENT:  [facility] Adult PT Treatment  01/01/2024:  Therapeutic Exercise:  nu-step L5 47m while taking subjective and planning session with patient LTR - 20x Alternating clam GTB - 2x10 ea Bridge from ball - 2x10 SLR - 2x10 ea Abdominal squeeze in HL with swiss ball - 2x10  Manual Therapy  Gentle S/L lumbar traction (R) and STM of lumbar paraspinals    HOME EXERCISE  PROGRAM: Access Code: [email or web address] URL: [email or web address] Date: 01/01/2024 Prepared by: Alphonzo Severance  Exercises - Supine Lower Trunk Rotation  - 1 x daily - 7 x weekly - 1 sets - 20 reps - 3 hold - Supine Hip Adduction Isometric with Ball  - 1 x daily - 7 x weekly - 2 sets - 10 reps - 10'' hold - Hooklying Isometric Clamshell  - 1 x daily - 7 x weekly - 3 sets - 10 reps - Supine Bridge  - 1 x daily - 7 x weekly - 3 sets - 10 reps - 2-3 seconds hold - Active Straight Leg Raise with Quad Set  - 1 x daily - 7 x weekly - 3 sets - 10 reps  Treatment priorities   Eval        balance        DF assist brace, stool for cooking        General LE and core strength as able                          ASSESSMENT:  CLINICAL IMPRESSION:  01/01/2024: Tasia Catchings tolerated session well with no adverse reaction.  We concentrated on basic mat hip and core strengthening today.  Pt reports moderate fatigue but no increase in pain with exercise.  HEP updated.  Pt will receive lumbar injection on Friday which will likely also improve his pain.  EVAL:  Gaspar is a 89 y.o. male who presents to clinic with signs and sxs consistent with bil LE weakness, low back, and R hip pain.  Pt exam is consistent with main pain generator coming from low back vs R hip.  Pt has minimal recreation of sxs with hip ROM, finds hip  flexion relieving, no MOI, and has only minimal TTP over R lateral hip.  He has a history of loss of disk height throughout lumbar spine, pain in the superior posterior gluteals, and improvement with steroid injections.  He does show significant weakness in L DF.  We talked extensively about possible adaptations in the short term including getting a bar stool to use while cooking and a DF assist brace for the L foot.  His objective exam puts him at increased fall risk per 5x STS and TUG.  He will benefit from PT to address relevant deficits and improve his safety and comfort during daily  tasks.  OBJECTIVE IMPAIRMENTS: Pain, hip ROM, lumbar ROM, hip and and LE strength, gait, balance  ACTIVITY LIMITATIONS: standing, walking, housework, caring for wife, step navigation  PERSONAL FACTORS: See medical history and pertinent history   REHAB POTENTIAL: Good  CLINICAL DECISION MAKING: Evolving/moderate complexity  EVALUATION COMPLEXITY: Moderate   GOALS:   SHORT TERM GOALS: Target date: 01/25/2024  Zakkery will be >75% HEP compliant to improve carryover between sessions and facilitate independent management of condition  Evaluation: ongoing Goal status: INITIAL   LONG TERM GOALS: Target date: 02/22/2024   Lem will self report >/= 50% decrease in pain from evaluation to improve function in daily tasks  Evaluation/Baseline: 10/10 max pain Goal status: INITIAL   2.  Madix will lower TUG score (MCID 3.4'') to </=12'' to show reduced risk of falls   Evaluation/Baseline: 17'' Goal status: INITIAL   3.  Broady will improve 30'' STS (MCID 2) to >/= 10x (w/ UE?: Y) to show improved LE strength and improved transfers   Evaluation/Baseline: 8x  w/ UE? Y Goal status: INITIAL   4.  Londen will reports significant improvement in ability to stand to cook meals  Evaluation/Baseline: requires frequent seated rest breaks Goal status: INITIAL   5.  Buford will show a >/= 18 pt improvement in LEFS score (MCID is ~11% or 9 pts) as a proxy for functional improvement   Evaluation/Baseline: 23 pts Goal status: INITIAL   PLAN: PT FREQUENCY: 1-2x/week  PT DURATION: 8 weeks  PLANNED INTERVENTIONS:  [id number]- PT Re-evaluation, 97110-Therapeutic exercises, 97530- Therapeutic activity, [id number]- Neuromuscular re-education, 97535- Self Care, [id number]- Manual therapy, [id number]- Gait training, [id number]- Aquatic Therapy, [phone]- Electrical stimulation (manual), [id number]- Vasopneumatic device, [id number]- Traction (mechanical), [id number]- Ionotophoresis 4mg /ml Dexamethasone, Taping, Dry Needling, Joint  manipulation, and Spinal manipulation.   Alphonzo Severance PT, DPT 01/01/2024, 1:21 PM  Date of referral: 12/21/2023 Referring provider: Cheryll Cockayne Referring diagnosis? Tear of right gluteus minimus tendon, subsequent encounter [S76.011D], Bilateral leg weakness [R29.898]  Treatment diagnosis? (if different than referring diagnosis)     What was this (referring dx) caused by? Unspecified  Nature of Condition: Chronic (continuous duration > 3 months)   Laterality: Rt  Current Functional Measure Score: LEFS 23/80  Objective measurements identify impairments when they are compared to normal values, the uninvolved extremity, and prior level of function.  [x]  Yes  []  No  Objective assessment of functional ability: Moderate functional limitations   Briefly describe symptoms: LE weakness and R sided hip/LBP which reduces ability to walk, stand, and perform housework  How did symptoms start: chronic with no clear MOI  Average pain intensity:  Last 24 hours: 8/10  Past week: 8/10  How often does the pt experience symptoms? Frequently  How much have the symptoms interfered with usual daily activities? Quite a bit  How has condition changed since care began at this facility?  NA - initial visit  In general, how is the patients overall health? Fair   BACK PAIN (STarT Back Screening Tool) Has pain spread down the leg(s) at some time in the last 2 weeks? N Has there been pain in the shoulder or neck at some time in the last 2 weeks? N Has the pt only walked short distances because of back pain? Y Has patient dressed more slowly because of back pain in the past 2 weeks? Y Does patient think it's not safe for a person with this condition to be physically active? N Does patient have worrying thoughts a lot of the time? N Does patient feel back pain is terrible and will never get any better? N Has patient stopped enjoying things they usually enjoy? N

## 2024-01-03 ENCOUNTER — Encounter: Payer: Self-pay | Admitting: Physical Therapy

## 2024-01-03 ENCOUNTER — Ambulatory Visit: Admitting: Physical Therapy

## 2024-01-03 DIAGNOSIS — R2681 Unsteadiness on feet: Secondary | ICD-10-CM

## 2024-01-03 DIAGNOSIS — M5459 Other low back pain: Secondary | ICD-10-CM

## 2024-01-03 DIAGNOSIS — M6281 Muscle weakness (generalized): Secondary | ICD-10-CM

## 2024-01-03 DIAGNOSIS — M25551 Pain in right hip: Secondary | ICD-10-CM

## 2024-01-03 NOTE — Therapy (Signed)
 OUTPATIENT PHYSICAL THERAPY DAILY NOTE  Patient Name: Andrew [name] MRN: [id number] DOB:21-Jun-1934, 89 y.o., male Today's Date: 01/03/2024   PT End of Session - 01/03/24 0703     Visit Number 3    Number of Visits --   1-2x/week   Date for PT Re-Evaluation 02/22/24    Authorization Type [employer] MCR - LEFS    Progress Note Due on Visit 10    PT Start Time 0702    PT Stop Time 0742    PT Time Calculation (min) 40 min             Past Medical History:  Diagnosis Date   Colonic polyp 2011 last colo   colo q 75yr - [facility]    Coronary artery disease 02/2009   a. 02/2009 Cath: severe left main and three-vessel-->CABG; b. 12/2016 MVL EF 49%, no isch/infarct.   DEGENERATIVE JOINT DISEASE    Diastolic dysfunction    a. 10/2021 Echo: EF 55-60%, no rwma, mod conc LVH, GrI DD, mildly reduced RV fxn, RVSP 26.8 mmHg. mild AI, and aortic sclerosis.   Diverticulosis of colon    DIVERTICULOSIS, COLON    History of kidney stones    HYPERLIPIDEMIA-MIXED    HYPERTENSION, BENIGN    Interstitial lung disease (HCC)    LBP (low back pain)    s/p decompression 8/14; ESI - [place]; RFA x 3 early 2015   Pacemaker    a. 05/2017: St[employer] Jude (serial number [id number]) pacemaker   Symptomatic bradycardia    a. s/p [employer] Jude (serial number [id number]) pacemaker 06/16/17   Type II or unspecified type diabetes mellitus without mention of complication, not stated as uncontrolled    VITAMIN D DEFICIENCY    Past Surgical History:  Procedure Laterality Date   CARPAL TUNNEL RELEASE Right    CATARACT EXTRACTION, BILATERAL     R 07/07/13, L 07/21/13   CORONARY ARTERY BYPASS GRAFT  03/17/09   x 4   CYSTOSCOPY WITH STENT PLACEMENT Left 05/10/2021   Procedure: CYSTOSCOPY WITH LEFT  STENT PLACEMENT,LEFT RETROGRADE PYELOGRAM;  Surgeon: Crist Fat, MD;  Location: [facility];  Service: Urology;  Laterality: Left;   CYSTOSCOPY/URETEROSCOPY/HOLMIUM LASER/STENT PLACEMENT Left 06/03/2021   Procedure: CYSTOSCOPY, LEFT  RETROGRADE, URETEROSCOPY,HOLMIUM LASER,STENT EXCHANGE;  Surgeon: Crist Fat, MD;  Location: [facility];  Service: Urology;  Laterality: Left;   INGUINAL HERNIA REPAIR  1980   KNEE ARTHROSCOPY  2007   right   LUMBAR EPIDURAL INJECTION  2013   Has as needed.   LUMBAR LAMINECTOMY/DECOMPRESSION MICRODISCECTOMY N/A 06/19/2013   Procedure: LUMBAR LAMINECTOMY/DECOMPRESSION MICRODISCECTOMY;  Surgeon: Emilee Hero, MD;  Location: [facility] OR;  Service: Orthopedics;  Laterality: N/A;  Lumbar 4-5 decompression   PACEMAKER IMPLANT N/A 06/16/2017   Procedure: Pacemaker Implant;  Surgeon: Marinus Maw, MD;  Location: [facility] IN[facility]IVE [facility];  Service: Cardiovascular;  Laterality: N/A;   TRIGGER FINGER RELEASE Right    Patient Active Problem List   Diagnosis Date Noted   Tear of right gluteus minimus tendon 12/21/2023   Anemia 04/02/2023   Hematoma 03/18/2023   Supratherapeutic INR 03/18/2023   Pulmonary fibrosis (HCC) 04/02/2022   Poor balance 12/28/2021   Bilateral leg weakness 04/01/2021   CKD (chronic kidney disease) stage 3, GFR 30-59 ml/min (HCC) 03/31/2021   Pacemaker 07/21/2019   History of deep venous thrombosis (DVT) of distal vein of left lower extremity 08/28/2017   Complete heart block (HCC) - PPM 06/15/2017   Wears hearing aid 09/25/2016  Degeneration of lumbar or lumbosacral intervertebral disc 03/23/2015   Vitamin D deficiency 11/03/2010   Diverticulosis of colon 11/03/2010   Osteoarthritis (arthritis due to wear and tear of joints) 11/03/2010   Dyslipidemia, goal LDL below 70 12/01/2009   CAD, ARTERY BYPASS GRAFT 12/01/2009   DOE (dyspnea on exertion) 12/01/2009   Type 2 diabetes mellitus with neurological manifestations, controlled (HCC)     PCP: Pincus Sanes, MD  REFERRING PROVIDER: Pincus Sanes, MD  THERAPY DIAG:  Other low back pain  Pain in right hip  Muscle weakness  Unsteadiness on feet  REFERRING DIAG: Tear of right gluteus minimus tendon,  subsequent encounter [S76.011D], Bilateral leg weakness [R29.898]   Rationale for Evaluation and Treatment:  Rehabilitation  SUBJECTIVE:  PERTINENT PAST HISTORY:  DM, CAD with bypass 2017, DOE, laminectomy, pacemaker, hx of DVT, complete tear of R glute min, blood thinner      PRECAUTIONS: pacemaker  WEIGHT BEARING RESTRICTIONS No  FALLS:  Has patient fallen in last 6 months? No, Number of falls: 0, but feels unstable  MOI/History of condition:  Onset date: May 2024  SUBJECTIVE STATEMENT  01/03/2024: Pt reports that he was able to complete his exercises at home.  He reports he felt good after last visit.  EVAL:  Andrew [name] is a 89 y.o. male who presents to clinic with chief complaint of R hip pain.  This started suddenly with with no clear injury.  He went out to dinner one night and woke up the next day and was unable to bear weight on his R leg.  He ultimately had an MRI which showed a glute min tear.  He went to another PT location but feels they cater to younger people and push him too hard.  Pt lives with wife who is in a w/c after a fall and hip fracture.  He is the main caregiver.  He lives with his son who is somewhat helpful.  He states he feel like he may catch the toes of his R foot when walking at times.   Red flags:  denies   Pain:  Are you having pain? Yes Pain location: posterior hip  NPRS scale:  2/10 to 10/10 Aggravating factors: standing, walking Relieving factors: sitting, resting Pain description: sharp and aching Stage: Chronic 24 hour pattern: stiff in morning, worse with activity   Occupation: retired  Education administrator: [employer]  Hand Dominance: NA  Patient Goals/Specific Activities: balance, strength, pain   OBJECTIVE:   DIAGNOSTIC FINDINGS:  MRI hip 10/2023-no hematoma, complete tear of right gluteus minimus tendon, mild tendinosis of right gluteus medius tendon.  Mild right greater trochanteric bursitis.  Moderate partial-thickness  cartilage loss of the femoral head and acetabulum bilaterally   GENERAL OBSERVATION/GAIT: Antalgic gait with reduced time in stance on R  SENSATION: Light touch: Appears intact  PALPATION: Minimal TTP R hip  MUSCLE LENGTH: Hamstrings: Right significant, w/ pain restriction; Left subtle restriction   LE MMT:  MMT Right (Eval) Left (Eval)  Hip flexion (L2, L3) 3+* 4  Knee extension (L3) 4 4  Knee flexion 3+ 4  Hip abduction 3* 3+  Hip extension    Hip external rotation    Hip internal rotation    Hip adduction    Ankle dorsiflexion (L4) 4 3-  Ankle plantarflexion (S1) 4 nt  Ankle inversion    Ankle eversion    Great Toe ext (L5)    Grossly     (Blank rows = not  tested, score listed is out of 5 possible points.  N = WNL, D = diminished, C = clear for gross weakness with myotome testing, * = concordant pain with testing)  LE ROM:  ROM Right (Eval) Left (Eval)  Hip flexion Limited to 90 Limited to 90  Hip extension    Hip abduction    Hip adduction    Hip internal rotation 0 no pain 0 no pain  Hip external rotation Limited no pain Limited  No pain  Knee extension    Knee flexion    Ankle dorsiflexion    Ankle plantarflexion    Ankle inversion    Ankle eversion     (Blank rows = not tested, N = WNL, * = concordant pain with testing)  Functional Tests  Eval    30'' STS: 8x  UE used? Y    TUG: 17''                                                      PATIENT SURVEYS:  LEFS: 23/80  TODAY'S TREATMENT:  [facility] Adult PT Treatment  01/03/2024:  Therapeutic Exercise:  nu-step L5 11m while taking subjective and planning session with patient LTR - 20x S/L clam - YTB - 2x10 ea Bridge from ball - 2x10 - 3'' hold Alternating SLR - 2x10 ea Abdominal squeeze in HL with swiss ball - 2x10 (not today) R DF - 2x10 - YTB  Neuromuscular re-ed: FT eyes closed Semi tandem (50%) on foam Blue rocker board (DF/PF) min UE support    HOME EXERCISE  PROGRAM: Access Code: [email or web address] URL: [email or web address] Date: 01/01/2024 Prepared by: Alphonzo Severance  Exercises - Supine Lower Trunk Rotation  - 1 x daily - 7 x weekly - 1 sets - 20 reps - 3 hold - Supine Hip Adduction Isometric with Ball  - 1 x daily - 7 x weekly - 2 sets - 10 reps - 10'' hold - Hooklying Isometric Clamshell  - 1 x daily - 7 x weekly - 3 sets - 10 reps - Supine Bridge  - 1 x daily - 7 x weekly - 3 sets - 10 reps - 2-3 seconds hold - Active Straight Leg Raise with Quad Set  - 1 x daily - 7 x weekly - 3 sets - 10 reps  Treatment priorities   Eval        balance        DF assist brace, stool for cooking        General LE and core strength as able                          ASSESSMENT:  CLINICAL IMPRESSION:  01/03/2024: Andrew [name] tolerated session well with no adverse reaction.  Introduced balance exercises today.  Pt tolerated this well with most difficulty with DF on rocker board which is consistent with L DF weakness.  Will continue progressing as tolerated.   EVAL:  Andrew [name] is a 89 y.o. male who presents to clinic with signs and sxs consistent with bil LE weakness, low back, and R hip pain.  Pt exam is consistent with main pain generator coming from low back vs R hip.  Pt has minimal recreation of sxs with hip ROM, finds hip flexion relieving, no MOI, and has only minimal  TTP over R lateral hip.  He has a history of loss of disk height throughout lumbar spine, pain in the superior posterior gluteals, and improvement with steroid injections.  He does show significant weakness in L DF.  We talked extensively about possible adaptations in the short term including getting a bar stool to use while cooking and a DF assist brace for the L foot.  His objective exam puts him at increased fall risk per 5x STS and TUG.  He will benefit from PT to address relevant deficits and improve his safety and comfort during daily tasks.  OBJECTIVE IMPAIRMENTS: Pain, hip ROM,  lumbar ROM, hip and and LE strength, gait, balance  ACTIVITY LIMITATIONS: standing, walking, housework, caring for wife, step navigation  PERSONAL FACTORS: See medical history and pertinent history   REHAB POTENTIAL: Good  CLINICAL DECISION MAKING: Evolving/moderate complexity  EVALUATION COMPLEXITY: Moderate   GOALS:   SHORT TERM GOALS: Target date: 01/25/2024  Andrew [name] will be >75% HEP compliant to improve carryover between sessions and facilitate independent management of condition  Evaluation: ongoing Goal status: INITIAL   LONG TERM GOALS: Target date: 02/22/2024   Andrew [name] will self report >/= 50% decrease in pain from evaluation to improve function in daily tasks  Evaluation/Baseline: 10/10 max pain Goal status: INITIAL   2.  Andrew [name] will lower TUG score (MCID 3.4'') to </=12'' to show reduced risk of falls   Evaluation/Baseline: 17'' Goal status: INITIAL   3.  Andrew [name] will improve 30'' STS (MCID 2) to >/= 10x (w/ UE?: Y) to show improved LE strength and improved transfers   Evaluation/Baseline: 8x  w/ UE? Y Goal status: INITIAL   4.  Andrew [name] will reports significant improvement in ability to stand to cook meals  Evaluation/Baseline: requires frequent seated rest breaks Goal status: INITIAL   5.  Andrew [name] will show a >/= 18 pt improvement in LEFS score (MCID is ~11% or 9 pts) as a proxy for functional improvement   Evaluation/Baseline: 23 pts Goal status: INITIAL   PLAN: PT FREQUENCY: 1-2x/week  PT DURATION: 8 weeks  PLANNED INTERVENTIONS:  [id number]- PT Re-evaluation, 97110-Therapeutic exercises, 97530- Therapeutic activity, [id number]- Neuromuscular re-education, 97535- Self Care, [id number]- Manual therapy, [id number]- Gait training, [id number]- Aquatic Therapy, [id number]- Electrical stimulation (manual), [id number]- Vasopneumatic device, [id number]- Traction (mechanical), [id number]- Ionotophoresis 4mg /ml Dexamethasone, Taping, Dry Needling, Joint manipulation, and Spinal manipulation.   Alphonzo Severance PT, DPT 01/03/2024, 12:14 PM  Date of referral: 12/21/2023 Referring provider: Cheryll Cockayne Referring diagnosis? Tear of right gluteus minimus tendon, subsequent encounter [S76.011D], Bilateral leg weakness [R29.898]  Treatment diagnosis? (if different than referring diagnosis)     What was this (referring dx) caused by? Unspecified  Nature of Condition: Chronic (continuous duration > 3 months)   Laterality: Rt  Current Functional Measure Score: LEFS 23/80  Objective measurements identify impairments when they are compared to normal values, the uninvolved extremity, and prior level of function.  [x]  Yes  []  No  Objective assessment of functional ability: Moderate functional limitations   Briefly describe symptoms: LE weakness and R sided hip/LBP which reduces ability to walk, stand, and perform housework  How did symptoms start: chronic with no clear MOI  Average pain intensity:  Last 24 hours: 8/10  Past week: 8/10  How often does the pt experience symptoms? Frequently  How much have the symptoms interfered with usual daily activities? Quite a bit  How has condition changed since care began at this facility? NA - initial visit  In general, how  is the patients overall health? Fair   BACK PAIN (STarT Back Screening Tool) Has pain spread down the leg(s) at some time in the last 2 weeks? N Has there been pain in the shoulder or neck at some time in the last 2 weeks? N Has the pt only walked short distances because of back pain? Y Has patient dressed more slowly because of back pain in the past 2 weeks? Y Does patient think it's not safe for a person with this condition to be physically active? N Does patient have worrying thoughts a lot of the time? N Does patient feel back pain is terrible and will never get any better? N Has patient stopped enjoying things they usually enjoy? N

## 2024-01-09 ENCOUNTER — Encounter: Payer: Self-pay | Admitting: Physical Therapy

## 2024-01-09 ENCOUNTER — Ambulatory Visit: Admitting: Physical Therapy

## 2024-01-09 DIAGNOSIS — R2681 Unsteadiness on feet: Secondary | ICD-10-CM

## 2024-01-09 DIAGNOSIS — M5459 Other low back pain: Secondary | ICD-10-CM

## 2024-01-09 DIAGNOSIS — M25551 Pain in right hip: Secondary | ICD-10-CM

## 2024-01-09 DIAGNOSIS — M6281 Muscle weakness (generalized): Secondary | ICD-10-CM

## 2024-01-09 NOTE — Therapy (Signed)
 OUTPATIENT PHYSICAL THERAPY DAILY NOTE  Patient Name: Andrew [name] MRN: [id number] DOB:[birth year], 89 y.o., male Today's Date: 01/09/2024   PT End of Session - 01/09/24 1000     Visit Number 3    Number of Visits --   1-2x/week   Date for PT Re-Evaluation 02/22/24    Authorization Type [employer] MCR - LEFS    Progress Note Due on Visit 10    PT Start Time 1000    PT Stop Time 1041    PT Time Calculation (min) 41 min             Past Medical History:  Diagnosis Date   Colonic polyp 2011 last colo   colo q 69yr - [facility]    Coronary artery disease 02/2009   a. 02/2009 Cath: severe left main and three-vessel-->CABG; b. 12/2016 MVL EF 49%, no isch/infarct.   DEGENERATIVE JOINT DISEASE    Diastolic dysfunction    a. 10/2021 Echo: EF 55-60%, no rwma, mod conc LVH, GrI DD, mildly reduced RV fxn, RVSP 26.8 mmHg. mild AI, and aortic sclerosis.   Diverticulosis of colon    DIVERTICULOSIS, COLON    History of kidney stones    HYPERLIPIDEMIA-MIXED    HYPERTENSION, BENIGN    Interstitial lung disease (HCC)    LBP (low back pain)    s/p decompression 8/14; ESI - [place]; RFA x 3 early 2015   Pacemaker    a. 05/2017: St. Jude (serial number [id number]) pacemaker   Symptomatic bradycardia    a. s/p St. Jude (serial number [id number]) pacemaker 06/16/17   Type II or unspecified type diabetes mellitus without mention of complication, not stated as uncontrolled    VITAMIN D DEFICIENCY    Past Surgical History:  Procedure Laterality Date   CARPAL TUNNEL RELEASE Right    CATARACT EXTRACTION, BILATERAL     R 07/07/13, L 07/21/13   CORONARY ARTERY BYPASS GRAFT  03/17/09   x 4   CYSTOSCOPY WITH STENT PLACEMENT Left 05/10/2021   Procedure: CYSTOSCOPY WITH LEFT  STENT PLACEMENT,LEFT RETROGRADE PYELOGRAM;  Surgeon: Crist Fat, MD;  Location: [facility];  Service: Urology;  Laterality: Left;   CYSTOSCOPY/URETEROSCOPY/HOLMIUM LASER/STENT PLACEMENT Left 06/03/2021   Procedure: CYSTOSCOPY, LEFT  RETROGRADE, URETEROSCOPY,HOLMIUM LASER,STENT EXCHANGE;  Surgeon: Crist Fat, MD;  Location: [facility];  Service: Urology;  Laterality: Left;   INGUINAL HERNIA REPAIR  1980   KNEE ARTHROSCOPY  2007   right   LUMBAR EPIDURAL INJECTION  2013   Has as needed.   LUMBAR LAMINECTOMY/DECOMPRESSION MICRODISCECTOMY N/A 06/19/2013   Procedure: LUMBAR LAMINECTOMY/DECOMPRESSION MICRODISCECTOMY;  Surgeon: Emilee Hero, MD;  Location: [facility] OR;  Service: Orthopedics;  Laterality: N/A;  Lumbar 4-5 decompression   PACEMAKER IMPLANT N/A 06/16/2017   Procedure: Pacemaker Implant;  Surgeon: Marinus Maw, MD;  Location: [facility] INVASIVE CV LAB;  Service: Cardiovascular;  Laterality: N/A;   TRIGGER FINGER RELEASE Right    Patient Active Problem List   Diagnosis Date Noted   Tear of right gluteus minimus tendon 12/21/2023   Anemia 04/02/2023   Hematoma 03/18/2023   Supratherapeutic INR 03/18/2023   Pulmonary fibrosis (HCC) 04/02/2022   Poor balance 12/28/2021   Bilateral leg weakness 04/01/2021   CKD (chronic kidney disease) stage 3, GFR 30-59 ml/min (HCC) 03/31/2021   Pacemaker 07/21/2019   History of deep venous thrombosis (DVT) of distal vein of left lower extremity 08/28/2017   Complete heart block (HCC) - PPM 06/15/2017   Wears hearing aid 09/25/2016  Degeneration of lumbar or lumbosacral intervertebral disc 03/23/2015   Vitamin D deficiency 11/03/2010   Diverticulosis of colon 11/03/2010   Osteoarthritis (arthritis due to wear and tear of joints) 11/03/2010   Dyslipidemia, goal LDL below 70 12/01/2009   CAD, ARTERY BYPASS GRAFT 12/01/2009   DOE (dyspnea on exertion) 12/01/2009   Type 2 diabetes mellitus with neurological manifestations, controlled (HCC)     PCP: Pincus Sanes, MD  REFERRING PROVIDER: Pincus Sanes, MD  THERAPY DIAG:  Other low back pain  Pain in right hip  Muscle weakness  Unsteadiness on feet  REFERRING DIAG: Tear of right gluteus minimus tendon,  subsequent encounter [S76.011D], Bilateral leg weakness [R29.898]   Rationale for Evaluation and Treatment:  Rehabilitation  SUBJECTIVE:  PERTINENT PAST HISTORY:  DM, CAD with bypass 2017, DOE, laminectomy, pacemaker, hx of DVT, complete tear of R glute min, blood thinner      PRECAUTIONS: pacemaker  WEIGHT BEARING RESTRICTIONS No  FALLS:  Has patient fallen in last 6 months? No, Number of falls: 0, but feels unstable  MOI/History of condition:  Onset date: May 2024  SUBJECTIVE STATEMENT  01/09/2024: Pt reports that he was tender after his injection but this is feeling better now.  He has been doing the exercises at night and states his hip is sore in the morning.  He feels like this is beneficial overall.  EVAL:  Andrew [name] is a 89 y.o. male who presents to clinic with chief complaint of R hip pain.  This started suddenly with with no clear injury.  He went out to dinner one night and woke up the next day and was unable to bear weight on his R leg.  He ultimately had an MRI which showed a glute min tear.  He went to another PT location but feels they cater to younger people and push him too hard.  Pt lives with wife who is in a w/c after a fall and hip fracture.  He is the main caregiver.  He lives with his son who is somewhat helpful.  He states he feel like he may catch the toes of his R foot when walking at times.   Red flags:  denies   Pain:  Are you having pain? Yes Pain location: posterior hip  NPRS scale:  2/10 to 10/10 Aggravating factors: standing, walking Relieving factors: sitting, resting Pain description: sharp and aching Stage: Chronic 24 hour pattern: stiff in morning, worse with activity   Occupation: retired  Education administrator: [employer]  Hand Dominance: NA  Patient Goals/Specific Activities: balance, strength, pain   OBJECTIVE:   DIAGNOSTIC FINDINGS:  MRI hip 10/2023-no hematoma, complete tear of right gluteus minimus tendon, mild tendinosis of  right gluteus medius tendon.  Mild right greater trochanteric bursitis.  Moderate partial-thickness cartilage loss of the femoral head and acetabulum bilaterally   GENERAL OBSERVATION/GAIT: Antalgic gait with reduced time in stance on R  SENSATION: Light touch: Appears intact  PALPATION: Minimal TTP R hip  MUSCLE LENGTH: Hamstrings: Right significant, w/ pain restriction; Left subtle restriction   LE MMT:  MMT Right (Eval) Left (Eval)  Hip flexion (L2, L3) 3+* 4  Knee extension (L3) 4 4  Knee flexion 3+ 4  Hip abduction 3* 3+  Hip extension    Hip external rotation    Hip internal rotation    Hip adduction    Ankle dorsiflexion (L4) 4 3-  Ankle plantarflexion (S1) 4 nt  Ankle inversion    Ankle  eversion    Great Toe ext (L5)    Grossly     (Blank rows = not tested, score listed is out of 5 possible points.  N = WNL, D = diminished, C = clear for gross weakness with myotome testing, * = concordant pain with testing)  LE ROM:  ROM Right (Eval) Left (Eval)  Hip flexion Limited to 90 Limited to 90  Hip extension    Hip abduction    Hip adduction    Hip internal rotation 0 no pain 0 no pain  Hip external rotation Limited no pain Limited  No pain  Knee extension    Knee flexion    Ankle dorsiflexion    Ankle plantarflexion    Ankle inversion    Ankle eversion     (Blank rows = not tested, N = WNL, * = concordant pain with testing)  Functional Tests  Eval    30'' STS: 8x  UE used? Y    TUG: 17''                                                      PATIENT SURVEYS:  LEFS: 23/80  TODAY'S TREATMENT:  [facility] Adult PT Treatment  01/09/2024:  Therapeutic Exercise:  nu-step L5 35m while taking subjective and planning session with patient S/L clam - YTB - 2x10 ea - Some R low back pain Updating hand reviewing HEP  Therapeutic Activity  Bridge bridge with clam - GTB - 10x Step up - 4'' step - 10x fwd and lat ea  Neuromuscular re-ed: Semi  tandem (75%) on foam Blue rocker board (DF/PF) min UE support Lateral walking on airex beam    HOME EXERCISE PROGRAM: Access Code: [email or web address] URL: [email or web address] Date: 01/01/2024 Prepared by: Alphonzo Severance  Exercises - Supine Lower Trunk Rotation  - 1 x daily - 7 x weekly - 1 sets - 20 reps - 3 hold - Supine Hip Adduction Isometric with Ball  - 1 x daily - 7 x weekly - 2 sets - 10 reps - 10'' hold - Hooklying Isometric Clamshell  - 1 x daily - 7 x weekly - 3 sets - 10 reps - Supine Bridge  - 1 x daily - 7 x weekly - 3 sets - 10 reps - 2-3 seconds hold - Active Straight Leg Raise with Quad Set  - 1 x daily - 7 x weekly - 3 sets - 10 reps  Treatment priorities   Eval        balance        DF assist brace, stool for cooking        General LE and core strength as able                          ASSESSMENT:  CLINICAL IMPRESSION:  01/09/2024: Andrew [name] tolerated session well with no adverse reaction.  Continued working on balance today with progression from 50% tandem to 75% on foam.  Balance exercises added to HEP and bridge progressed for HEP.  S/L clam does increase R low back pain so continued with HL clam for HEP now with progression to GTB.  EVAL:  Andrew [name] is a 89 y.o. male who presents to clinic with signs and sxs consistent with bil LE weakness, low back, and R hip  pain.  Pt exam is consistent with main pain generator coming from low back vs R hip.  Pt has minimal recreation of sxs with hip ROM, finds hip flexion relieving, no MOI, and has only minimal TTP over R lateral hip.  He has a history of loss of disk height throughout lumbar spine, pain in the superior posterior gluteals, and improvement with steroid injections.  He does show significant weakness in L DF.  We talked extensively about possible adaptations in the short term including getting a bar stool to use while cooking and a DF assist brace for the L foot.  His objective exam puts him at increased fall  risk per 5x STS and TUG.  He will benefit from PT to address relevant deficits and improve his safety and comfort during daily tasks.  OBJECTIVE IMPAIRMENTS: Pain, hip ROM, lumbar ROM, hip and and LE strength, gait, balance  ACTIVITY LIMITATIONS: standing, walking, housework, caring for wife, step navigation  PERSONAL FACTORS: See medical history and pertinent history   REHAB POTENTIAL: Good  CLINICAL DECISION MAKING: Evolving/moderate complexity  EVALUATION COMPLEXITY: Moderate   GOALS:   SHORT TERM GOALS: Target date: 01/25/2024  Keishaun will be >75% HEP compliant to improve carryover between sessions and facilitate independent management of condition  Evaluation: ongoing Goal status: INITIAL   LONG TERM GOALS: Target date: 02/22/2024   Andrew [name] will self report >/= 50% decrease in pain from evaluation to improve function in daily tasks  Evaluation/Baseline: 10/10 max pain Goal status: INITIAL   2.  Andrew [name] will lower TUG score (MCID 3.4'') to </=12'' to show reduced risk of falls   Evaluation/Baseline: 17'' Goal status: INITIAL   3.  Andrew [name] will improve 30'' STS (MCID 2) to >/= 10x (w/ UE?: Y) to show improved LE strength and improved transfers   Evaluation/Baseline: 8x  w/ UE? Y Goal status: INITIAL   4.  Andrew [name] will reports significant improvement in ability to stand to cook meals  Evaluation/Baseline: requires frequent seated rest breaks Goal status: INITIAL   5.  Andrew [name] will show a >/= 18 pt improvement in LEFS score (MCID is ~11% or 9 pts) as a proxy for functional improvement   Evaluation/Baseline: 23 pts Goal status: INITIAL   PLAN: PT FREQUENCY: 1-2x/week  PT DURATION: 8 weeks  PLANNED INTERVENTIONS:  [id number]- PT Re-evaluation, 97110-Therapeutic exercises, 97530- Therapeutic activity, [id number]- Neuromuscular re-education, 97535- Self Care, [id number]- Manual therapy, [id number]- Gait training, [id number]- Aquatic Therapy, [phone]- Electrical stimulation (manual), [id number]-  Vasopneumatic device, [id number]- Traction (mechanical), [id number]- Ionotophoresis 4mg /ml Dexamethasone, Taping, Dry Needling, Joint manipulation, and Spinal manipulation.   Alphonzo Severance PT, DPT 01/09/2024, 10:51 AM  Date of referral: 12/21/2023 Referring provider: Cheryll Cockayne Referring diagnosis? Tear of right gluteus minimus tendon, subsequent encounter [S76.011D], Bilateral leg weakness [R29.898]  Treatment diagnosis? (if different than referring diagnosis)     What was this (referring dx) caused by? Unspecified  Nature of Condition: Chronic (continuous duration > 3 months)   Laterality: Rt  Current Functional Measure Score: LEFS 23/80  Objective measurements identify impairments when they are compared to normal values, the uninvolved extremity, and prior level of function.  [x]  Yes  []  No  Objective assessment of functional ability: Moderate functional limitations   Briefly describe symptoms: LE weakness and R sided hip/LBP which reduces ability to walk, stand, and perform housework  How did symptoms start: chronic with no clear MOI  Average pain intensity:  Last 24 hours: 8/10  Past week: 8/10  How often does the  pt experience symptoms? Frequently  How much have the symptoms interfered with usual daily activities? Quite a bit  How has condition changed since care began at this facility? NA - initial visit  In general, how is the patients overall health? Fair   BACK PAIN (STarT Back Screening Tool) Has pain spread down the leg(s) at some time in the last 2 weeks? N Has there been pain in the shoulder or neck at some time in the last 2 weeks? N Has the pt only walked short distances because of back pain? Y Has patient dressed more slowly because of back pain in the past 2 weeks? Y Does patient think it's not safe for a person with this condition to be physically active? N Does patient have worrying thoughts a lot of the time? N Does patient feel back pain is terrible  and will never get any better? N Has patient stopped enjoying things they usually enjoy? N

## 2024-01-11 ENCOUNTER — Ambulatory Visit: Admitting: Physical Therapy

## 2024-01-11 ENCOUNTER — Encounter: Payer: Self-pay | Admitting: Physical Therapy

## 2024-01-11 DIAGNOSIS — M5459 Other low back pain: Secondary | ICD-10-CM | POA: Diagnosis not present

## 2024-01-11 DIAGNOSIS — R2681 Unsteadiness on feet: Secondary | ICD-10-CM

## 2024-01-11 DIAGNOSIS — M25551 Pain in right hip: Secondary | ICD-10-CM

## 2024-01-11 DIAGNOSIS — M6281 Muscle weakness (generalized): Secondary | ICD-10-CM

## 2024-01-11 NOTE — Therapy (Signed)
 OUTPATIENT PHYSICAL THERAPY DAILY NOTE  Patient Name: Andrew [name] MRN: [id number] DOB:1934/08/01, 89 y.o., male Today's Date: 01/11/2024   PT End of Session - 01/11/24 0743     Visit Number 4    Number of Visits --   1-2x/week   Date for PT Re-Evaluation 02/22/24    Authorization Type [employer] MCR - LEFS    Progress Note Due on Visit 10    PT Start Time 0745    PT Stop Time 0826    PT Time Calculation (min) 41 min              Past Medical History:  Diagnosis Date   Colonic polyp 2011 last colo   colo q 54yr - [facility]    Coronary artery disease 02/2009   a. 02/2009 Cath: severe left main and three-vessel-->CABG; b. 12/2016 MVL EF 49%, no isch/infarct.   DEGENERATIVE JOINT DISEASE    Diastolic dysfunction    a. 10/2021 Echo: EF 55-60%, no rwma, mod conc LVH, GrI DD, mildly reduced RV fxn, RVSP 26.8 mmHg. mild AI, and aortic sclerosis.   Diverticulosis of colon    DIVERTICULOSIS, COLON    History of kidney stones    HYPERLIPIDEMIA-MIXED    HYPERTENSION, BENIGN    Interstitial lung disease (HCC)    LBP (low back pain)    s/p decompression 8/14; ESI - [place]; RFA x 3 early 2015   Pacemaker    a. 05/2017: St. Jude (serial number [id number]) pacemaker   Symptomatic bradycardia    a. s/p St. Jude (serial number [id number]) pacemaker 06/16/17   Type II or unspecified type diabetes mellitus without mention of complication, not stated as uncontrolled    VITAMIN D DEFICIENCY    Past Surgical History:  Procedure Laterality Date   CARPAL TUNNEL RELEASE Right    CATARACT EXTRACTION, BILATERAL     R 07/07/13, L 07/21/13   CORONARY ARTERY BYPASS GRAFT  03/17/09   x 4   CYSTOSCOPY WITH STENT PLACEMENT Left 05/10/2021   Procedure: CYSTOSCOPY WITH LEFT  STENT PLACEMENT,LEFT RETROGRADE PYELOGRAM;  Surgeon: Crist Fat, MD;  Location: [facility];  Service: Urology;  Laterality: Left;   CYSTOSCOPY/URETEROSCOPY/HOLMIUM LASER/STENT PLACEMENT Left 06/03/2021   Procedure: CYSTOSCOPY, LEFT  RETROGRADE, URETEROSCOPY,HOLMIUM LASER,STENT EXCHANGE;  Surgeon: Crist Fat, MD;  Location: [facility];  Service: Urology;  Laterality: Left;   INGUINAL HERNIA REPAIR  1980   KNEE ARTHROSCOPY  2007   right   LUMBAR EPIDURAL INJECTION  2013   Has as needed.   LUMBAR LAMINECTOMY/DECOMPRESSION MICRODISCECTOMY N/A 06/19/2013   Procedure: LUMBAR LAMINECTOMY/DECOMPRESSION MICRODISCECTOMY;  Surgeon: Emilee Hero, MD;  Location: [facility] OR;  Service: Orthopedics;  Laterality: N/A;  Lumbar 4-5 decompression   PACEMAKER IMPLANT N/A 06/16/2017   Procedure: Pacemaker Implant;  Surgeon: Marinus Maw, MD;  Location: [facility] INVASIVE CV LAB;  Service: Cardiovascular;  Laterality: N/A;   TRIGGER FINGER RELEASE Right    Patient Active Problem List   Diagnosis Date Noted   Tear of right gluteus minimus tendon 12/21/2023   Anemia 04/02/2023   Hematoma 03/18/2023   Supratherapeutic INR 03/18/2023   Pulmonary fibrosis (HCC) 04/02/2022   Poor balance 12/28/2021   Bilateral leg weakness 04/01/2021   CKD (chronic kidney disease) stage 3, GFR 30-59 ml/min (HCC) 03/31/2021   Pacemaker 07/21/2019   History of deep venous thrombosis (DVT) of distal vein of left lower extremity 08/28/2017   Complete heart block (HCC) - PPM 06/15/2017   Wears hearing aid 09/25/2016  Degeneration of lumbar or lumbosacral intervertebral disc 03/23/2015   Vitamin D deficiency 11/03/2010   Diverticulosis of colon 11/03/2010   Osteoarthritis (arthritis due to wear and tear of joints) 11/03/2010   Dyslipidemia, goal LDL below 70 12/01/2009   CAD, ARTERY BYPASS GRAFT 12/01/2009   DOE (dyspnea on exertion) 12/01/2009   Type 2 diabetes mellitus with neurological manifestations, controlled (HCC)     PCP: Pincus Sanes, MD  REFERRING PROVIDER: Pincus Sanes, MD  THERAPY DIAG:  Other low back pain  Pain in right hip  Muscle weakness  Unsteadiness on feet  REFERRING DIAG: Tear of right gluteus minimus tendon,  subsequent encounter [S76.011D], Bilateral leg weakness [R29.898]   Rationale for Evaluation and Treatment:  Rehabilitation  SUBJECTIVE:  PERTINENT PAST HISTORY:  DM, CAD with bypass 2017, DOE, laminectomy, pacemaker, hx of DVT, complete tear of R glute min, blood thinner      PRECAUTIONS: pacemaker  WEIGHT BEARING RESTRICTIONS No  FALLS:  Has patient fallen in last 6 months? No, Number of falls: 0, but feels unstable  MOI/History of condition:  Onset date: May 2024  SUBJECTIVE STATEMENT  01/11/2024: Pt reports that he was tender after his injection but this is feeling better now.  He has been doing the exercises at night and states his hip is sore in the morning.  He feels like this is beneficial overall.  EVAL:  Andrew [name] is a 89 y.o. male who presents to clinic with chief complaint of R hip pain.  This started suddenly with with no clear injury.  He went out to dinner one night and woke up the next day and was unable to bear weight on his R leg.  He ultimately had an MRI which showed a glute min tear.  He went to another PT location but feels they cater to younger people and push him too hard.  Pt lives with wife who is in a w/c after a fall and hip fracture.  He is the main caregiver.  He lives with his son who is somewhat helpful.  He states he feel like he may catch the toes of his R foot when walking at times.   Red flags:  denies   Pain:  Are you having pain? Yes Pain location: posterior hip  NPRS scale:  2/10 to 10/10 Aggravating factors: standing, walking Relieving factors: sitting, resting Pain description: sharp and aching Stage: Chronic 24 hour pattern: stiff in morning, worse with activity   Occupation: retired  Education administrator: [employer]  Hand Dominance: NA  Patient Goals/Specific Activities: balance, strength, pain   OBJECTIVE:   DIAGNOSTIC FINDINGS:  MRI hip 10/2023-no hematoma, complete tear of right gluteus minimus tendon, mild tendinosis of  right gluteus medius tendon.  Mild right greater trochanteric bursitis.  Moderate partial-thickness cartilage loss of the femoral head and acetabulum bilaterally   GENERAL OBSERVATION/GAIT: Antalgic gait with reduced time in stance on R  SENSATION: Light touch: Appears intact  PALPATION: Minimal TTP R hip  MUSCLE LENGTH: Hamstrings: Right significant, w/ pain restriction; Left subtle restriction   LE MMT:  MMT Right (Eval) Left (Eval)  Hip flexion (L2, L3) 3+* 4  Knee extension (L3) 4 4  Knee flexion 3+ 4  Hip abduction 3* 3+  Hip extension    Hip external rotation    Hip internal rotation    Hip adduction    Ankle dorsiflexion (L4) 4 3-  Ankle plantarflexion (S1) 4 nt  Ankle inversion    Ankle  eversion    Great Toe ext (L5)    Grossly     (Blank rows = not tested, score listed is out of 5 possible points.  N = WNL, D = diminished, C = clear for gross weakness with myotome testing, * = concordant pain with testing)  LE ROM:  ROM Right (Eval) Left (Eval)  Hip flexion Limited to 90 Limited to 90  Hip extension    Hip abduction    Hip adduction    Hip internal rotation 0 no pain 0 no pain  Hip external rotation Limited no pain Limited  No pain  Knee extension    Knee flexion    Ankle dorsiflexion    Ankle plantarflexion    Ankle inversion    Ankle eversion     (Blank rows = not tested, N = WNL, * = concordant pain with testing)  Functional Tests  Eval    30'' STS: 8x  UE used? Y    TUG: 17''                                                      PATIENT SURVEYS:  LEFS: 23/80  TODAY'S TREATMENT:  [facility] Adult PT Treatment  01/11/2024:  Therapeutic Exercise:  nu-step L7 75m while taking subjective and planning session with patient  Therapeutic Activity  Staggered bridge - 2x10 ea Step up - 4'' step - 10x fwd and lat ea (not today) Sit to stand - 2x10 - raised table Standing hip abd on airex - 2x10  Neuromuscular re-ed: Blue rocker  board (DF/PF) min UE support Standing march on foam Tandem stance Hurdle step over 10x ea with ipsilateral UE support     HOME EXERCISE PROGRAM: Access Code: [email or web address] URL: [email or web address] Date: 01/01/2024 Prepared by: Alphonzo Severance  Exercises - Supine Lower Trunk Rotation  - 1 x daily - 7 x weekly - 1 sets - 20 reps - 3 hold - Supine Hip Adduction Isometric with Ball  - 1 x daily - 7 x weekly - 2 sets - 10 reps - 10'' hold - Hooklying Isometric Clamshell  - 1 x daily - 7 x weekly - 3 sets - 10 reps - Supine Bridge  - 1 x daily - 7 x weekly - 3 sets - 10 reps - 2-3 seconds hold - Active Straight Leg Raise with Quad Set  - 1 x daily - 7 x weekly - 3 sets - 10 reps  Treatment priorities   Eval        balance        DF assist brace, stool for cooking        General LE and core strength as able                          ASSESSMENT:  CLINICAL IMPRESSION:  01/11/2024: Andrew [name] tolerated session well with no adverse reaction.  Worked on progressing hip and core strengthening with addition of sit to stand from raised surface today.  Pt reports mix of majority fatigue, but some R hip soreness with sit to stand.  Discussed appropriate levels of pain during exercise.  Pt reports minor increase in R hip discomfort following therapy.  EVAL:  Andrew [name] is a 89 y.o. male who presents to clinic with signs and sxs consistent  with bil LE weakness, low back, and R hip pain.  Pt exam is consistent with main pain generator coming from low back vs R hip.  Pt has minimal recreation of sxs with hip ROM, finds hip flexion relieving, no MOI, and has only minimal TTP over R lateral hip.  He has a history of loss of disk height throughout lumbar spine, pain in the superior posterior gluteals, and improvement with steroid injections.  He does show significant weakness in R DF.  We talked extensively about possible adaptations in the short term including getting a bar stool to use while cooking  and a DF assist brace for the L foot.  His objective exam puts him at increased fall risk per 5x STS and TUG.  He will benefit from PT to address relevant deficits and improve his safety and comfort during daily tasks.  OBJECTIVE IMPAIRMENTS: Pain, hip ROM, lumbar ROM, hip and and LE strength, gait, balance  ACTIVITY LIMITATIONS: standing, walking, housework, caring for wife, step navigation  PERSONAL FACTORS: See medical history and pertinent history   REHAB POTENTIAL: Good  CLINICAL DECISION MAKING: Evolving/moderate complexity  EVALUATION COMPLEXITY: Moderate   GOALS:   SHORT TERM GOALS: Target date: 01/25/2024  Andrew [name] will be >75% HEP compliant to improve carryover between sessions and facilitate independent management of condition  Evaluation: ongoing Goal status: INITIAL   LONG TERM GOALS: Target date: 02/22/2024   Andrew [name] will self report >/= 50% decrease in pain from evaluation to improve function in daily tasks  Evaluation/Baseline: 10/10 max pain Goal status: INITIAL   2.  Andrew [name] will lower TUG score (MCID 3.4'') to </=12'' to show reduced risk of falls   Evaluation/Baseline: 17'' Goal status: INITIAL   3.  Andrew [name] will improve 30'' STS (MCID 2) to >/= 10x (w/ UE?: Y) to show improved LE strength and improved transfers   Evaluation/Baseline: 8x  w/ UE? Y Goal status: INITIAL   4.  Andrew [name] will reports significant improvement in ability to stand to cook meals  Evaluation/Baseline: requires frequent seated rest breaks Goal status: INITIAL   5.  Andrew [name] will show a >/= 18 pt improvement in LEFS score (MCID is ~11% or 9 pts) as a proxy for functional improvement   Evaluation/Baseline: 23 pts Goal status: INITIAL   PLAN: PT FREQUENCY: 1-2x/week  PT DURATION: 8 weeks  PLANNED INTERVENTIONS:  97164- PT Re-evaluation, 97110-Therapeutic exercises, 97530- Therapeutic activity, [id number]- Neuromuscular re-education, 97535- Self Care, [id number]- Manual therapy, [id number]- Gait  training, [id number]- Aquatic Therapy, [id number]- Electrical stimulation (manual), [id number]- Vasopneumatic device, [id number]- Traction (mechanical), [id number]- Ionotophoresis 4mg /ml Dexamethasone, Taping, Dry Needling, Joint manipulation, and Spinal manipulation.   Alphonzo Severance PT, DPT 01/11/2024, 8:38 AM  Date of referral: 12/21/2023 Referring provider: Cheryll Cockayne Referring diagnosis? Tear of right gluteus minimus tendon, subsequent encounter [S76.011D], Bilateral leg weakness [R29.898]  Treatment diagnosis? (if different than referring diagnosis)     What was this (referring dx) caused by? Unspecified  Nature of Condition: Chronic (continuous duration > 3 months)   Laterality: Rt  Current Functional Measure Score: LEFS 23/80  Objective measurements identify impairments when they are compared to normal values, the uninvolved extremity, and prior level of function.  [x]  Yes  []  No  Objective assessment of functional ability: Moderate functional limitations   Briefly describe symptoms: LE weakness and R sided hip/LBP which reduces ability to walk, stand, and perform housework  How did symptoms start: chronic with no clear MOI  Average pain intensity:  Last 24 hours: 8/10  Past week: 8/10  How often does the pt experience symptoms? Frequently  How much have the symptoms interfered with usual daily activities? Quite a bit  How has condition changed since care began at this facility? NA - initial visit  In general, how is the patients overall health? Fair   BACK PAIN (STarT Back Screening Tool) Has pain spread down the leg(s) at some time in the last 2 weeks? N Has there been pain in the shoulder or neck at some time in the last 2 weeks? N Has the pt only walked short distances because of back pain? Y Has patient dressed more slowly because of back pain in the past 2 weeks? Y Does patient think it's not safe for a person with this condition to be physically active? N Does patient have  worrying thoughts a lot of the time? N Does patient feel back pain is terrible and will never get any better? N Has patient stopped enjoying things they usually enjoy? N

## 2024-01-16 ENCOUNTER — Ambulatory Visit: Payer: Self-pay | Admitting: Physical Therapy

## 2024-01-16 ENCOUNTER — Encounter: Payer: Self-pay | Admitting: Physical Therapy

## 2024-01-16 DIAGNOSIS — M5459 Other low back pain: Secondary | ICD-10-CM

## 2024-01-16 DIAGNOSIS — M6281 Muscle weakness (generalized): Secondary | ICD-10-CM

## 2024-01-16 DIAGNOSIS — R2681 Unsteadiness on feet: Secondary | ICD-10-CM

## 2024-01-16 DIAGNOSIS — M25551 Pain in right hip: Secondary | ICD-10-CM

## 2024-01-16 NOTE — Therapy (Signed)
 OUTPATIENT PHYSICAL THERAPY DAILY NOTE  Patient Name: Andrew [name] MRN: [id number] DOB:08-02-1934, 89 y.o., male Today's Date: 01/16/2024   PT End of Session - 01/16/24 1259     Visit Number 5    Number of Visits --   1-2x/week   Date for PT Re-Evaluation 02/22/24    Authorization Type [employer] MCR - LEFS    Progress Note Due on Visit 10    PT Start Time 1300    PT Stop Time 1341    PT Time Calculation (min) 41 min              Past Medical History:  Diagnosis Date   Colonic polyp 2011 last colo   colo q 72yr - [facility]    Coronary artery disease 02/2009   a. 02/2009 Cath: severe left main and three-vessel-->CABG; b. 12/2016 MVL EF 49%, no isch/infarct.   DEGENERATIVE JOINT DISEASE    Diastolic dysfunction    a. 10/2021 Echo: EF 55-60%, no rwma, mod conc LVH, GrI DD, mildly reduced RV fxn, RVSP 26.8 mmHg. mild AI, and aortic sclerosis.   Diverticulosis of colon    DIVERTICULOSIS, COLON    History of kidney stones    HYPERLIPIDEMIA-MIXED    HYPERTENSION, BENIGN    Interstitial lung disease (HCC)    LBP (low back pain)    s/p decompression 8/14; ESI - [place]; RFA x 3 early 2015   Pacemaker    a. 05/2017: St. Jude (serial number [id number]) pacemaker   Symptomatic bradycardia    a. s/p St. Jude (serial number [id number]) pacemaker 06/16/17   Type II or unspecified type diabetes mellitus without mention of complication, not stated as uncontrolled    VITAMIN D DEFICIENCY    Past Surgical History:  Procedure Laterality Date   CARPAL TUNNEL RELEASE Right    CATARACT EXTRACTION, BILATERAL     R 07/07/13, L 07/21/13   CORONARY ARTERY BYPASS GRAFT  03/17/09   x 4   CYSTOSCOPY WITH STENT PLACEMENT Left 05/10/2021   Procedure: CYSTOSCOPY WITH LEFT  STENT PLACEMENT,LEFT RETROGRADE PYELOGRAM;  Surgeon: Crist Fat, MD;  Location: [facility];  Service: Urology;  Laterality: Left;   CYSTOSCOPY/URETEROSCOPY/HOLMIUM LASER/STENT PLACEMENT Left 06/03/2021   Procedure: CYSTOSCOPY, LEFT  RETROGRADE, URETEROSCOPY,HOLMIUM LASER,STENT EXCHANGE;  Surgeon: Crist Fat, MD;  Location: [facility];  Service: Urology;  Laterality: Left;   INGUINAL HERNIA REPAIR  1980   KNEE ARTHROSCOPY  2007   right   LUMBAR EPIDURAL INJECTION  2013   Has as needed.   LUMBAR LAMINECTOMY/DECOMPRESSION MICRODISCECTOMY N/A 06/19/2013   Procedure: LUMBAR LAMINECTOMY/DECOMPRESSION MICRODISCECTOMY;  Surgeon: Emilee Hero, MD;  Location: [facility] OR;  Service: Orthopedics;  Laterality: N/A;  Lumbar 4-5 decompression   PACEMAKER IMPLANT N/A 06/16/2017   Procedure: Pacemaker Implant;  Surgeon: Marinus Maw, MD;  Location: [facility] INVASIVE CV LAB;  Service: Cardiovascular;  Laterality: N/A;   TRIGGER FINGER RELEASE Right    Patient Active Problem List   Diagnosis Date Noted   Tear of right gluteus minimus tendon 12/21/2023   Anemia 04/02/2023   Hematoma 03/18/2023   Supratherapeutic INR 03/18/2023   Pulmonary fibrosis (HCC) 04/02/2022   Poor balance 12/28/2021   Bilateral leg weakness 04/01/2021   CKD (chronic kidney disease) stage 3, GFR 30-59 ml/min (HCC) 03/31/2021   Pacemaker 07/21/2019   History of deep venous thrombosis (DVT) of distal vein of left lower extremity 08/28/2017   Complete heart block (HCC) - PPM 06/15/2017   Wears hearing aid 09/25/2016  Degeneration of lumbar or lumbosacral intervertebral disc 03/23/2015   Vitamin D deficiency 11/03/2010   Diverticulosis of colon 11/03/2010   Osteoarthritis (arthritis due to wear and tear of joints) 11/03/2010   Dyslipidemia, goal LDL below 70 12/01/2009   CAD, ARTERY BYPASS GRAFT 12/01/2009   DOE (dyspnea on exertion) 12/01/2009   Type 2 diabetes mellitus with neurological manifestations, controlled (HCC)     PCP: Pincus Sanes, MD  REFERRING PROVIDER: Pincus Sanes, MD  THERAPY DIAG:  Other low back pain  Pain in right hip  Muscle weakness  Unsteadiness on feet  REFERRING DIAG: Tear of right gluteus minimus tendon,  subsequent encounter [S76.011D], Bilateral leg weakness [R29.898]   Rationale for Evaluation and Treatment:  Rehabilitation  SUBJECTIVE:  PERTINENT PAST HISTORY:  DM, CAD with bypass 2017, DOE, laminectomy, pacemaker, hx of DVT, complete tear of R glute min, blood thinner      PRECAUTIONS: pacemaker  WEIGHT BEARING RESTRICTIONS No  FALLS:  Has patient fallen in last 6 months? No, Number of falls: 0, but feels unstable  MOI/History of condition:  Onset date: May 2024  SUBJECTIVE STATEMENT  01/16/2024: Pt reports that he was working on his garden this weekend and his back is irritated.  EVAL:  Andrew [name] is a 89 y.o. male who presents to clinic with chief complaint of R hip pain.  This started suddenly with with no clear injury.  He went out to dinner one night and woke up the next day and was unable to bear weight on his R leg.  He ultimately had an MRI which showed a glute min tear.  He went to another PT location but feels they cater to younger people and push him too hard.  Pt lives with wife who is in a w/c after a fall and hip fracture.  He is the main caregiver.  He lives with his son who is somewhat helpful.  He states he feel like he may catch the toes of his R foot when walking at times.   Red flags:  denies   Pain:  Are you having pain? Yes Pain location: posterior hip  NPRS scale:  2/10 to 10/10 Aggravating factors: standing, walking Relieving factors: sitting, resting Pain description: sharp and aching Stage: Chronic 24 hour pattern: stiff in morning, worse with activity   Occupation: retired  Education administrator: [employer]  Hand Dominance: NA  Patient Goals/Specific Activities: balance, strength, pain   OBJECTIVE:   DIAGNOSTIC FINDINGS:  MRI hip 10/2023-no hematoma, complete tear of right gluteus minimus tendon, mild tendinosis of right gluteus medius tendon.  Mild right greater trochanteric bursitis.  Moderate partial-thickness cartilage loss of the  femoral head and acetabulum bilaterally   GENERAL OBSERVATION/GAIT: Antalgic gait with reduced time in stance on R  SENSATION: Light touch: Appears intact  PALPATION: Minimal TTP R hip  MUSCLE LENGTH: Hamstrings: Right significant, w/ pain restriction; Left subtle restriction   LE MMT:  MMT Right (Eval) Left (Eval)  Hip flexion (L2, L3) 3+* 4  Knee extension (L3) 4 4  Knee flexion 3+ 4  Hip abduction 3* 3+  Hip extension    Hip external rotation    Hip internal rotation    Hip adduction    Ankle dorsiflexion (L4) 4 3-  Ankle plantarflexion (S1) 4 nt  Ankle inversion    Ankle eversion    Great Toe ext (L5)    Grossly     (Blank rows = not tested, score listed is out  of 5 possible points.  N = WNL, D = diminished, C = clear for gross weakness with myotome testing, * = concordant pain with testing)  LE ROM:  ROM Right (Eval) Left (Eval)  Hip flexion Limited to 90 Limited to 90  Hip extension    Hip abduction    Hip adduction    Hip internal rotation 0 no pain 0 no pain  Hip external rotation Limited no pain Limited  No pain  Knee extension    Knee flexion    Ankle dorsiflexion    Ankle plantarflexion    Ankle inversion    Ankle eversion     (Blank rows = not tested, N = WNL, * = concordant pain with testing)  Functional Tests  Eval    30'' STS: 8x  UE used? Y    TUG: 17''                                                      PATIENT SURVEYS:  LEFS: 23/80  TODAY'S TREATMENT:  [facility] Adult PT Treatment  01/16/2024:  Therapeutic Exercise:  nu-step L7 39m while taking subjective and planning session with patient LTR on ball Knee ext machine: Bil x10 - 10# Unilateral - 2x6 - 10# Knee Flex machine: Bil x10 - 20# Unilateral - 2x10 - 15#  Therapeutic Activity  Bridge from green ball - 2x10 - 3'' hold Step up - 4'' step - 10x fwd and lat ea (not today) Sit to stand - 3x10 - raised table   HOME EXERCISE PROGRAM: Access Code:  [email or web address] URL: [email or web address] Date: 01/01/2024 Prepared by: Alphonzo Severance  Exercises - Supine Lower Trunk Rotation  - 1 x daily - 7 x weekly - 1 sets - 20 reps - 3 hold - Supine Hip Adduction Isometric with Ball  - 1 x daily - 7 x weekly - 2 sets - 10 reps - 10'' hold - Hooklying Isometric Clamshell  - 1 x daily - 7 x weekly - 3 sets - 10 reps - Supine Bridge  - 1 x daily - 7 x weekly - 3 sets - 10 reps - 2-3 seconds hold - Active Straight Leg Raise with Quad Set  - 1 x daily - 7 x weekly - 3 sets - 10 reps  Treatment priorities   Eval 3/26       balance STS from standard chair       DF assist brace, stool for cooking Balance on unstable surfaces       General LE and core strength as able                          ASSESSMENT:  CLINICAL IMPRESSION:  01/16/2024: Tasia Catchings tolerated session well with no adverse reaction.  He presents with increased pain today after working in the yard this weekend.  We avoided high volume of standing exercises today to avoid further exacerbation.  Added in some isolated knee strengthening which was tolerated well.  Will continue to work toward STS from standard chair.  EVAL:  Jobani is a 89 y.o. male who presents to clinic with signs and sxs consistent with bil LE weakness, low back, and R hip pain.  Pt exam is consistent with main pain generator coming from low back vs R hip.  Pt has minimal recreation of sxs with hip ROM, finds hip flexion relieving, no MOI, and has only minimal TTP over R lateral hip.  He has a history of loss of disk height throughout lumbar spine, pain in the superior posterior gluteals, and improvement with steroid injections.  He does show significant weakness in R DF.  We talked extensively about possible adaptations in the short term including getting a bar stool to use while cooking and a DF assist brace for the L foot.  His objective exam puts him at increased fall risk per 5x STS and TUG.  He will benefit from PT  to address relevant deficits and improve his safety and comfort during daily tasks.  OBJECTIVE IMPAIRMENTS: Pain, hip ROM, lumbar ROM, hip and and LE strength, gait, balance  ACTIVITY LIMITATIONS: standing, walking, housework, caring for wife, step navigation  PERSONAL FACTORS: See medical history and pertinent history   REHAB POTENTIAL: Good  CLINICAL DECISION MAKING: Evolving/moderate complexity  EVALUATION COMPLEXITY: Moderate   GOALS:   SHORT TERM GOALS: Target date: 01/25/2024  Joesiah will be >75% HEP compliant to improve carryover between sessions and facilitate independent management of condition  Evaluation: ongoing Goal status: INITIAL   LONG TERM GOALS: Target date: 02/22/2024   Trek will self report >/= 50% decrease in pain from evaluation to improve function in daily tasks  Evaluation/Baseline: 10/10 max pain Goal status: INITIAL   2.  Josephanthony will lower TUG score (MCID 3.4'') to </=12'' to show reduced risk of falls   Evaluation/Baseline: 17'' Goal status: INITIAL   3.  Lytle will improve 30'' STS (MCID 2) to >/= 10x (w/ UE?: Y) to show improved LE strength and improved transfers   Evaluation/Baseline: 8x  w/ UE? Y Goal status: INITIAL   4.  Lajuane will reports significant improvement in ability to stand to cook meals  Evaluation/Baseline: requires frequent seated rest breaks Goal status: INITIAL   5.  Derrich will show a >/= 18 pt improvement in LEFS score (MCID is ~11% or 9 pts) as a proxy for functional improvement   Evaluation/Baseline: 23 pts Goal status: INITIAL   PLAN: PT FREQUENCY: 1-2x/week  PT DURATION: 8 weeks  PLANNED INTERVENTIONS:  97164- PT Re-evaluation, 97110-Therapeutic exercises, 97530- Therapeutic activity, [id number]- Neuromuscular re-education, 97535- Self Care, 16109- Manual therapy, [id number]- Gait training, [id number]- Aquatic Therapy, [phone]- Electrical stimulation (manual), [id number]- Vasopneumatic device, [id number]- Traction (mechanical),  [id number]- Ionotophoresis 4mg /ml Dexamethasone, Taping, Dry Needling, Joint manipulation, and Spinal manipulation.   Alphonzo Severance PT, DPT 01/16/2024, 1:57 PM  Date of referral: 12/21/2023 Referring provider: Cheryll Cockayne Referring diagnosis? Tear of right gluteus minimus tendon, subsequent encounter [S76.011D], Bilateral leg weakness [R29.898]  Treatment diagnosis? (if different than referring diagnosis)     What was this (referring dx) caused by? Unspecified  Nature of Condition: Chronic (continuous duration > 3 months)   Laterality: Rt  Current Functional Measure Score: LEFS 23/80  Objective measurements identify impairments when they are compared to normal values, the uninvolved extremity, and prior level of function.  [x]  Yes  []  No  Objective assessment of functional ability: Moderate functional limitations   Briefly describe symptoms: LE weakness and R sided hip/LBP which reduces ability to walk, stand, and perform housework  How did symptoms start: chronic with no clear MOI  Average pain intensity:  Last 24 hours: 8/10  Past week: 8/10  How often does the pt experience symptoms? Frequently  How much have the symptoms interfered with usual daily activities? Quite a bit  How has condition changed since care began at this facility? NA - initial visit  In general, how is the patients overall health? Fair   BACK PAIN (STarT Back Screening Tool) Has pain spread down the leg(s) at some time in the last 2 weeks? N Has there been pain in the shoulder or neck at some time in the last 2 weeks? N Has the pt only walked short distances because of back pain? Y Has patient dressed more slowly because of back pain in the past 2 weeks? Y Does patient think it's not safe for a person with this condition to be physically active? N Does patient have worrying thoughts a lot of the time? N Does patient feel back pain is terrible and will never get any better? N Has patient stopped  enjoying things they usually enjoy? N

## 2024-01-16 NOTE — Progress Notes (Signed)
 Remote pacemaker transmission.

## 2024-01-16 NOTE — Addendum Note (Signed)
 Addended by: Elease Etienne A on: 01/16/2024 02:43 PM   Modules accepted: Orders

## 2024-01-22 ENCOUNTER — Ambulatory Visit: Payer: Self-pay | Attending: Internal Medicine | Admitting: Physical Therapy

## 2024-01-22 ENCOUNTER — Encounter: Payer: Self-pay | Admitting: Physical Therapy

## 2024-01-22 DIAGNOSIS — M25551 Pain in right hip: Secondary | ICD-10-CM | POA: Diagnosis present

## 2024-01-22 DIAGNOSIS — M6281 Muscle weakness (generalized): Secondary | ICD-10-CM | POA: Insufficient documentation

## 2024-01-22 DIAGNOSIS — M5459 Other low back pain: Secondary | ICD-10-CM | POA: Insufficient documentation

## 2024-01-22 DIAGNOSIS — R2681 Unsteadiness on feet: Secondary | ICD-10-CM | POA: Insufficient documentation

## 2024-01-22 NOTE — Therapy (Signed)
 OUTPATIENT PHYSICAL THERAPY DAILY NOTE  Patient Name: Andrew [name] MRN: [id number] DOB:01-01-1934, 89 y.o., male Today's Date: 01/22/2024   PT End of Session - 01/22/24 0830     Visit Number 6    Number of Visits --   1-2x/week   Date for PT Re-Evaluation 02/22/24    Authorization Type [employer] MCR - LEFS    Progress Note Due on Visit 10    PT Start Time 0830    PT Stop Time 0912    PT Time Calculation (min) 42 min              Past Medical History:  Diagnosis Date   Colonic polyp 2011 last colo   colo q 49yr - [facility]    Coronary artery disease 02/2009   a. 02/2009 Cath: severe left main and three-vessel-->CABG; b. 12/2016 MVL EF 49%, no isch/infarct.   DEGENERATIVE JOINT DISEASE    Diastolic dysfunction    a. 10/2021 Echo: EF 55-60%, no rwma, mod conc LVH, GrI DD, mildly reduced RV fxn, RVSP 26.8 mmHg. mild AI, and aortic sclerosis.   Diverticulosis of colon    DIVERTICULOSIS, COLON    History of kidney stones    HYPERLIPIDEMIA-MIXED    HYPERTENSION, BENIGN    Interstitial lung disease (HCC)    LBP (low back pain)    s/p decompression 8/14; ESI - [place]; RFA x 3 early 2015   Pacemaker    a. 05/2017: St. Jude (serial number [id number]) pacemaker   Symptomatic bradycardia    a. s/p St. Jude (serial number [id number]) pacemaker 06/16/17   Type II or unspecified type diabetes mellitus without mention of complication, not stated as uncontrolled    VITAMIN D DEFICIENCY    Past Surgical History:  Procedure Laterality Date   CARPAL TUNNEL RELEASE Right    CATARACT EXTRACTION, BILATERAL     R 07/07/13, L 07/21/13   CORONARY ARTERY BYPASS GRAFT  03/17/09   x 4   CYSTOSCOPY WITH STENT PLACEMENT Left 05/10/2021   Procedure: CYSTOSCOPY WITH LEFT  STENT PLACEMENT,LEFT RETROGRADE PYELOGRAM;  Surgeon: Crist Fat, MD;  Location: [facility];  Service: Urology;  Laterality: Left;   CYSTOSCOPY/URETEROSCOPY/HOLMIUM LASER/STENT PLACEMENT Left 06/03/2021   Procedure: CYSTOSCOPY, LEFT  RETROGRADE, URETEROSCOPY,HOLMIUM LASER,STENT EXCHANGE;  Surgeon: Crist Fat, MD;  Location: [facility];  Service: Urology;  Laterality: Left;   INGUINAL HERNIA REPAIR  1980   KNEE ARTHROSCOPY  2007   right   LUMBAR EPIDURAL INJECTION  2013   Has as needed.   LUMBAR LAMINECTOMY/DECOMPRESSION MICRODISCECTOMY N/A 06/19/2013   Procedure: LUMBAR LAMINECTOMY/DECOMPRESSION MICRODISCECTOMY;  Surgeon: Emilee Hero, MD;  Location: [facility] OR;  Service: Orthopedics;  Laterality: N/A;  Lumbar 4-5 decompression   PACEMAKER IMPLANT N/A 06/16/2017   Procedure: Pacemaker Implant;  Surgeon: Marinus Maw, MD;  Location: [facility];  Service: Cardiovascular;  Laterality: N/A;   TRIGGER FINGER RELEASE Right    Patient Active Problem List   Diagnosis Date Noted   Tear of right gluteus minimus tendon 12/21/2023   Anemia 04/02/2023   Hematoma 03/18/2023   Supratherapeutic INR 03/18/2023   Pulmonary fibrosis (HCC) 04/02/2022   Poor balance 12/28/2021   Bilateral leg weakness 04/01/2021   CKD (chronic kidney disease) stage 3, GFR 30-59 ml/min (HCC) 03/31/2021   Pacemaker 07/21/2019   History of deep venous thrombosis (DVT) of distal vein of left lower extremity 08/28/2017   Complete heart block (HCC) - PPM 06/15/2017   Wears hearing aid 09/25/2016  Degeneration of lumbar or lumbosacral intervertebral disc 03/23/2015   Vitamin D deficiency 11/03/2010   Diverticulosis of colon 11/03/2010   Osteoarthritis (arthritis due to wear and tear of joints) 11/03/2010   Dyslipidemia, goal LDL below 70 12/01/2009   CAD, ARTERY BYPASS GRAFT 12/01/2009   DOE (dyspnea on exertion) 12/01/2009   Type 2 diabetes mellitus with neurological manifestations, controlled (HCC)     PCP: Pincus Sanes, MD  REFERRING PROVIDER: Pincus Sanes, MD  THERAPY DIAG:  Other low back pain  Pain in right hip  Muscle weakness  Unsteadiness on feet  REFERRING DIAG: Tear of right gluteus minimus tendon,  subsequent encounter [S76.011D], Bilateral leg weakness [R29.898]   Rationale for Evaluation and Treatment:  Rehabilitation  SUBJECTIVE:  PERTINENT PAST HISTORY:  DM, CAD with bypass 2017, DOE, laminectomy, pacemaker, hx of DVT, complete tear of R glute min, blood thinner      PRECAUTIONS: pacemaker  WEIGHT BEARING RESTRICTIONS No  FALLS:  Has patient fallen in last 6 months? No, Number of falls: 0, but feels unstable  MOI/History of condition:  Onset date: May 2024  SUBJECTIVE STATEMENT  01/22/2024: Pt reports that he has some increased irritation after exercise.  He is not sure how helpful PT has been.  EVAL:  Andrew [name] is a 89 y.o. male who presents to clinic with chief complaint of R hip pain.  This started suddenly with with no clear injury.  He went out to dinner one night and woke up the next day and was unable to bear weight on his R leg.  He ultimately had an MRI which showed a glute min tear.  He went to another PT location but feels they cater to younger people and push him too hard.  Pt lives with wife who is in a w/c after a fall and hip fracture.  He is the main caregiver.  He lives with his son who is somewhat helpful.  He states he feel like he may catch the toes of his R foot when walking at times.   Red flags:  denies   Pain:  Are you having pain? Yes Pain location: posterior hip  NPRS scale:  2/10 to 10/10 Aggravating factors: standing, walking Relieving factors: sitting, resting Pain description: sharp and aching Stage: Chronic 24 hour pattern: stiff in morning, worse with activity   Occupation: retired  Education administrator: [employer]  Hand Dominance: NA  Patient Goals/Specific Activities: balance, strength, pain   OBJECTIVE:   DIAGNOSTIC FINDINGS:  MRI hip 10/2023-no hematoma, complete tear of right gluteus minimus tendon, mild tendinosis of right gluteus medius tendon.  Mild right greater trochanteric bursitis.  Moderate partial-thickness  cartilage loss of the femoral head and acetabulum bilaterally   GENERAL OBSERVATION/GAIT: Antalgic gait with reduced time in stance on R  SENSATION: Light touch: Appears intact  PALPATION: Minimal TTP R hip  MUSCLE LENGTH: Hamstrings: Right significant, w/ pain restriction; Left subtle restriction   LE MMT:  MMT Right (Eval) Left (Eval)  Hip flexion (L2, L3) 3+* 4  Knee extension (L3) 4 4  Knee flexion 3+ 4  Hip abduction 3* 3+  Hip extension    Hip external rotation    Hip internal rotation    Hip adduction    Ankle dorsiflexion (L4) 4 3-  Ankle plantarflexion (S1) 4 nt  Ankle inversion    Ankle eversion    Great Toe ext (L5)    Grossly     (Blank rows = not tested,  score listed is out of 5 possible points.  N = WNL, D = diminished, C = clear for gross weakness with myotome testing, * = concordant pain with testing)  LE ROM:  ROM Right (Eval) Left (Eval)  Hip flexion Limited to 90 Limited to 90  Hip extension    Hip abduction    Hip adduction    Hip internal rotation 0 no pain 0 no pain  Hip external rotation Limited no pain Limited  No pain  Knee extension    Knee flexion    Ankle dorsiflexion    Ankle plantarflexion    Ankle inversion    Ankle eversion     (Blank rows = not tested, N = WNL, * = concordant pain with testing)  Functional Tests  Eval    30'' STS: 8x  UE used? Y    TUG: 17''                                                      PATIENT SURVEYS:  LEFS: 23/80  TODAY'S TREATMENT:  [facility] Adult PT Treatment  01/22/2024:  Therapeutic Exercise:  nu-step L8 72m while taking subjective and planning session with patient Knee ext machine: Bil x10 - 10# Unilateral - 2x10 - 5#  Manual Therapy Manual lumbar traction on P ball  Therapeutic Activity  Step up - 6'' step - 10x fwd ea Sit to stand - 3x10 - raised table Standing hip abd - 10x ea Checking goals   HOME EXERCISE PROGRAM: Access Code: [email or web address] URL:  [email or web address] Date: 01/22/2024 Prepared by: Alphonzo Severance  Exercises - Sit to Stand Without Arm Support  - 1 x daily - 7 x weekly - 3 sets - 10 reps - Standing Hip Abduction with Counter Support  - 1 x daily - 7 x weekly - 3 sets - 10 reps - Step Up  - 1 x daily - 7 x weekly - 3 sets - 10 reps - Lateral Step Up  - 1 x daily - 7 x weekly - 3 sets - 10 reps - Standing Romberg to 3/4 Tandem Stance  - 1 x daily - 7 x weekly - 1 sets - 3 reps - 45-60 sec hold  Treatment priorities   Eval 3/26       balance STS from standard chair       DF assist brace, stool for cooking Balance on unstable surfaces       General LE and core strength as able                          ASSESSMENT:  CLINICAL IMPRESSION:  01/22/2024: Andrew [name] tolerated session well with no adverse reaction.  He Pt reports that he continues to have R lower back pain which is bothersome to him.  He reports little change since starting PT.  Upon goal recheck he has made some progress with TUG and STS goal but has not met MCID yet.  HEP modified to eliminate potentially agging movements.  Will continue to visit 10 and make a decision on benefit at that point.  Andrew [name] is in agreement with plan.  EVAL:  Andrew [name] is a 89 y.o. male who presents to clinic with signs and sxs consistent with bil LE weakness, low back, and R hip pain.  Pt exam is consistent with main pain generator coming from low back vs R hip.  Pt has minimal recreation of sxs with hip ROM, finds hip flexion relieving, no MOI, and has only minimal TTP over R lateral hip.  He has a history of loss of disk height throughout lumbar spine, pain in the superior posterior gluteals, and improvement with steroid injections.  He does show significant weakness in R DF.  We talked extensively about possible adaptations in the short term including getting a bar stool to use while cooking and a DF assist brace for the L foot.  His objective exam puts him at increased fall  risk per 5x STS and TUG.  He will benefit from PT to address relevant deficits and improve his safety and comfort during daily tasks.  OBJECTIVE IMPAIRMENTS: Pain, hip ROM, lumbar ROM, hip and and LE strength, gait, balance  ACTIVITY LIMITATIONS: standing, walking, housework, caring for wife, step navigation  PERSONAL FACTORS: See medical history and pertinent history   REHAB POTENTIAL: Good  CLINICAL DECISION MAKING: Evolving/moderate complexity  EVALUATION COMPLEXITY: Moderate   GOALS:   SHORT TERM GOALS: Target date: 01/25/2024  Andrew [name] will be >75% HEP compliant to improve carryover between sessions and facilitate independent management of condition  Evaluation: ongoing Goal status: INITIAL   LONG TERM GOALS: Target date: 02/22/2024   Andrew [name] will self report >/= 50% decrease in pain from evaluation to improve function in daily tasks  Evaluation/Baseline: 10/10 max pain 4/1: 10/10 max, average 3-5/10 Goal status: Ongoing   2.  Andrew [name] will lower TUG score (MCID 3.4'') to </=12'' to show reduced risk of falls   Evaluation/Baseline: 17'' 4/1:  15'' Goal status: Ongoing   3.  Andrew [name] will improve 30'' STS (MCID 2) to >/= 10x (w/ UE?: Y) to show improved LE strength and improved transfers   Evaluation/Baseline: 8x  w/ UE? Y 4/1: 9x w/ UE support Goal status: Ongoing    4.  Andrew [name] will reports significant improvement in ability to stand to cook meals  Evaluation/Baseline: requires frequent seated rest breaks 4/1: little change Goal status: INITIAL   5.  Andrew [name] will show a >/= 18 pt improvement in LEFS score (MCID is ~11% or 9 pts) as a proxy for functional improvement   Evaluation/Baseline: 23 pts Goal status: INITIAL   PLAN: PT FREQUENCY: 1-2x/week  PT DURATION: 8 weeks  PLANNED INTERVENTIONS:  [id number]- PT Re-evaluation, 97110-Therapeutic exercises, 97530- Therapeutic activity, [id number]- Neuromuscular re-education, 97535- Self Care, [id number]- Manual therapy, [id number]- Gait  training, [id number]- Aquatic Therapy, [phone]- Electrical stimulation (manual), [id number]- Vasopneumatic device, [id number]- Traction (mechanical), [id number]- Ionotophoresis 4mg /ml Dexamethasone, Taping, Dry Needling, Joint manipulation, and Spinal manipulation.   Alphonzo Severance PT, DPT 01/22/2024, 9:17 AM  Date of referral: 12/21/2023 Referring provider: Cheryll Cockayne Referring diagnosis? Tear of right gluteus minimus tendon, subsequent encounter [S76.011D], Bilateral leg weakness [R29.898]  Treatment diagnosis? (if different than referring diagnosis)     What was this (referring dx) caused by? Unspecified  Nature of Condition: Chronic (continuous duration > 3 months)   Laterality: Rt  Current Functional Measure Score: LEFS 23/80  Objective measurements identify impairments when they are compared to normal values, the uninvolved extremity, and prior level of function.  [x]  Yes  []  No  Objective assessment of functional ability: Moderate functional limitations   Briefly describe symptoms: LE weakness and R sided hip/LBP which reduces ability to walk, stand, and perform housework  How did symptoms start: chronic with no clear MOI  Average pain  intensity:  Last 24 hours: 8/10  Past week: 8/10  How often does the pt experience symptoms? Frequently  How much have the symptoms interfered with usual daily activities? Quite a bit  How has condition changed since care began at this facility? NA - initial visit  In general, how is the patients overall health? Fair   BACK PAIN (STarT Back Screening Tool) Has pain spread down the leg(s) at some time in the last 2 weeks? N Has there been pain in the shoulder or neck at some time in the last 2 weeks? N Has the pt only walked short distances because of back pain? Y Has patient dressed more slowly because of back pain in the past 2 weeks? Y Does patient think it's not safe for a person with this condition to be physically active? N Does patient have  worrying thoughts a lot of the time? N Does patient feel back pain is terrible and will never get any better? N Has patient stopped enjoying things they usually enjoy? N

## 2024-01-24 ENCOUNTER — Encounter: Payer: Self-pay | Admitting: Physical Therapy

## 2024-01-24 ENCOUNTER — Ambulatory Visit: Payer: Self-pay | Admitting: Physical Therapy

## 2024-01-24 DIAGNOSIS — M25551 Pain in right hip: Secondary | ICD-10-CM

## 2024-01-24 DIAGNOSIS — M5459 Other low back pain: Secondary | ICD-10-CM | POA: Diagnosis not present

## 2024-01-24 DIAGNOSIS — M6281 Muscle weakness (generalized): Secondary | ICD-10-CM

## 2024-01-24 DIAGNOSIS — R2681 Unsteadiness on feet: Secondary | ICD-10-CM

## 2024-01-24 NOTE — Therapy (Signed)
 OUTPATIENT PHYSICAL THERAPY DAILY NOTE  Patient Name: Andrew [name] MRN: [id number] DOB:Oct 17, 1934, 89 y.o., male Today's Date: 01/24/2024   PT End of Session - 01/24/24 1001     Visit Number 7    Number of Visits --   1-2x/week   Date for PT Re-Evaluation 02/22/24    Authorization Type [employer] MCR - LEFS    Progress Note Due on Visit 10    PT Start Time 1001    PT Stop Time 1041    PT Time Calculation (min) 40 min              Past Medical History:  Diagnosis Date   Colonic polyp 2011 last colo   colo q 39yr - [facility]    Coronary artery disease 02/2009   a. 02/2009 Cath: severe left main and three-vessel-->CABG; b. 12/2016 MVL EF 49%, no isch/infarct.   DEGENERATIVE JOINT DISEASE    Diastolic dysfunction    a. 10/2021 Echo: EF 55-60%, no rwma, mod conc LVH, GrI DD, mildly reduced RV fxn, RVSP 26.8 mmHg. mild AI, and aortic sclerosis.   Diverticulosis of colon    DIVERTICULOSIS, COLON    History of kidney stones    HYPERLIPIDEMIA-MIXED    HYPERTENSION, BENIGN    Interstitial lung disease (HCC)    LBP (low back pain)    s/p decompression 8/14; ESI - [place]; RFA x 3 early 2015   Pacemaker    a. 05/2017: St. Jude (serial number [id number]) pacemaker   Symptomatic bradycardia    a. s/p St. Jude (serial number [id number]) pacemaker 06/16/17   Type II or unspecified type diabetes mellitus without mention of complication, not stated as uncontrolled    VITAMIN D DEFICIENCY    Past Surgical History:  Procedure Laterality Date   CARPAL TUNNEL RELEASE Right    CATARACT EXTRACTION, BILATERAL     R 07/07/13, L 07/21/13   CORONARY ARTERY BYPASS GRAFT  03/17/09   x 4   CYSTOSCOPY WITH STENT PLACEMENT Left 05/10/2021   Procedure: CYSTOSCOPY WITH LEFT  STENT PLACEMENT,LEFT RETROGRADE PYELOGRAM;  Surgeon: Crist Fat, MD;  Location: [facility];  Service: Urology;  Laterality: Left;   CYSTOSCOPY/URETEROSCOPY/HOLMIUM LASER/STENT PLACEMENT Left 06/03/2021   Procedure: CYSTOSCOPY, LEFT  RETROGRADE, URETEROSCOPY,HOLMIUM LASER,STENT EXCHANGE;  Surgeon: Crist Fat, MD;  Location: [facility];  Service: Urology;  Laterality: Left;   INGUINAL HERNIA REPAIR  1980   KNEE ARTHROSCOPY  2007   right   LUMBAR EPIDURAL INJECTION  2013   Has as needed.   LUMBAR LAMINECTOMY/DECOMPRESSION MICRODISCECTOMY N/A 06/19/2013   Procedure: LUMBAR LAMINECTOMY/DECOMPRESSION MICRODISCECTOMY;  Surgeon: Emilee Hero, MD;  Location[facility] OR;  Service: Orthopedics;  Laterality: N/A;  Lumbar 4-5 decompression   PACEMAKER IMPLANT N/A 06/16/2017   Procedure: Pacemaker Implant;  Surgeon: Marinus Maw, MD;  Location: [facility] INVASIVE CV LAB;  Service: Cardiovascular;  Laterality: N/A;   TRIGGER FINGER RELEASE Right    Patient Active Problem List   Diagnosis Date Noted   Tear of right gluteus minimus tendon 12/21/2023   Anemia 04/02/2023   Hematoma 03/18/2023   Supratherapeutic INR 03/18/2023   Pulmonary fibrosis (HCC) 04/02/2022   Poor balance 12/28/2021   Bilateral leg weakness 04/01/2021   CKD (chronic kidney disease) stage 3, GFR 30-59 ml/min (HCC) 03/31/2021   Pacemaker 07/21/2019   History of deep venous thrombosis (DVT) of distal vein of left lower extremity 08/28/2017   Complete heart block (HCC) - PPM 06/15/2017   Wears hearing aid 09/25/2016  Degeneration of lumbar or lumbosacral intervertebral disc 03/23/2015   Vitamin D deficiency 11/03/2010   Diverticulosis of colon 11/03/2010   Osteoarthritis (arthritis due to wear and tear of joints) 11/03/2010   Dyslipidemia, goal LDL below 70 12/01/2009   CAD, ARTERY BYPASS GRAFT 12/01/2009   DOE (dyspnea on exertion) 12/01/2009   Type 2 diabetes mellitus with neurological manifestations, controlled (HCC)     PCP: Pincus Sanes, MD  REFERRING PROVIDER: Pincus Sanes, MD  THERAPY DIAG:  Other low back pain  Pain in right hip  Muscle weakness  Unsteadiness on feet  REFERRING DIAG: Tear of right gluteus minimus tendon,  subsequent encounter [S76.011D], Bilateral leg weakness [R29.898]   Rationale for Evaluation and Treatment:  Rehabilitation  SUBJECTIVE:  PERTINENT PAST HISTORY:  DM, CAD with bypass 2017, DOE, laminectomy, pacemaker, hx of DVT, complete tear of R glute min, blood thinner      PRECAUTIONS: pacemaker  WEIGHT BEARING RESTRICTIONS No  FALLS:  Has patient fallen in last 6 months? No, Number of falls: 0, but feels unstable  MOI/History of condition:  Onset date: May 2024  SUBJECTIVE STATEMENT  01/24/2024: Pt reports that the change in his HEP has improved his pain.  He reports he is more comfortable than he has been.  EVAL:  [name] is a 89 y.o. male who presents to clinic with chief complaint of R hip pain.  This started suddenly with with no clear injury.  He went out to dinner one night and woke up the next day and was unable to bear weight on his R leg.  He ultimately had an MRI which showed a glute min tear.  He went to another PT location but feels they cater to younger people and push him too hard.  Pt lives with wife who is in a w/c after a fall and hip fracture.  He is the main caregiver.  He lives with his son who is somewhat helpful.  He states he feel like he may catch the toes of his R foot when walking at times.   Red flags:  denies   Pain:  Are you having pain? Yes Pain location: posterior hip  NPRS scale:  2/10 to 10/10 Aggravating factors: standing, walking Relieving factors: sitting, resting Pain description: sharp and aching Stage: Chronic 24 hour pattern: stiff in morning, worse with activity   Occupation: retired  Education administrator: [employer]  Hand Dominance: NA  Patient Goals/Specific Activities: balance, strength, pain   OBJECTIVE:   DIAGNOSTIC FINDINGS:  MRI hip 10/2023-no hematoma, complete tear of right gluteus minimus tendon, mild tendinosis of right gluteus medius tendon.  Mild right greater trochanteric bursitis.  Moderate  partial-thickness cartilage loss of the femoral head and acetabulum bilaterally   GENERAL OBSERVATION/GAIT: Antalgic gait with reduced time in stance on R  SENSATION: Light touch: Appears intact  PALPATION: Minimal TTP R hip  MUSCLE LENGTH: Hamstrings: Right significant, w/ pain restriction; Left subtle restriction   LE MMT:  MMT Right (Eval) Left (Eval)  Hip flexion (L2, L3) 3+* 4  Knee extension (L3) 4 4  Knee flexion 3+ 4  Hip abduction 3* 3+  Hip extension    Hip external rotation    Hip internal rotation    Hip adduction    Ankle dorsiflexion (L4) 4 3-  Ankle plantarflexion (S1) 4 nt  Ankle inversion    Ankle eversion    Great Toe ext (L5)    Grossly     (Blank rows =  not tested, score listed is out of 5 possible points.  N = WNL, D = diminished, C = clear for gross weakness with myotome testing, * = concordant pain with testing)  LE ROM:  ROM Right (Eval) Left (Eval)  Hip flexion Limited to 90 Limited to 90  Hip extension    Hip abduction    Hip adduction    Hip internal rotation 0 no pain 0 no pain  Hip external rotation Limited no pain Limited  No pain  Knee extension    Knee flexion    Ankle dorsiflexion    Ankle plantarflexion    Ankle inversion    Ankle eversion     (Blank rows = not tested, N = WNL, * = concordant pain with testing)  Functional Tests  Eval    30'' STS: 8x  UE used? Y    TUG: 17''                                                      PATIENT SURVEYS:  LEFS: 23/80  TODAY'S TREATMENT:  [facility] Adult PT Treatment  01/24/2024:  Therapeutic Exercise:  nu-step L8 54m while taking subjective and planning session with patient Knee ext machine: Bil 2x10 - 15# Unilateral - 2x10 - 5#  Manual Therapy Manual lumbar traction on P ball  Therapeutic Activity  Obstacle course - hurdles, 6'' step up, lateral walking airex beam, STS    HOME EXERCISE PROGRAM: Access Code: [email or web address] URL:  [email or web address] Date: 01/22/2024 Prepared by: Alphonzo Severance  Exercises - Sit to Stand Without Arm Support  - 1 x daily - 7 x weekly - 3 sets - 10 reps - Standing Hip Abduction with Counter Support  - 1 x daily - 7 x weekly - 3 sets - 10 reps - Step Up  - 1 x daily - 7 x weekly - 3 sets - 10 reps - Lateral Step Up  - 1 x daily - 7 x weekly - 3 sets - 10 reps - Standing Romberg to 3/4 Tandem Stance  - 1 x daily - 7 x weekly - 1 sets - 3 reps - 45-60 sec hold  Treatment priorities   Eval 3/26       balance STS from standard chair       DF assist brace, stool for cooking Balance on unstable surfaces       General LE and core strength as able                          ASSESSMENT:  CLINICAL IMPRESSION:  01/24/2024: Tasia Catchings tolerated session well with no adverse reaction.  Pt reports improvement in lower back pain since stopping rotational exercises with HEP.  He is able to perform STS from standard chair with no UE support today which is a significant improvement compared to eval.  Added in obstacle course today to work on functional mobility with fatigue CG to minA provided for safety.  EVAL:  Amanda is a 89 y.o. male who presents to clinic with signs and sxs consistent with bil LE weakness, low back, and R hip pain.  Pt exam is consistent with main pain generator coming from low back vs R hip.  Pt has minimal recreation of sxs with hip ROM, finds hip flexion relieving, no  MOI, and has only minimal TTP over R lateral hip.  He has a history of loss of disk height throughout lumbar spine, pain in the superior posterior gluteals, and improvement with steroid injections.  He does show significant weakness in R DF.  We talked extensively about possible adaptations in the short term including getting a bar stool to use while cooking and a DF assist brace for the L foot.  His objective exam puts him at increased fall risk per 5x STS and TUG.  He will benefit from PT to address  relevant deficits and improve his safety and comfort during daily tasks.  OBJECTIVE IMPAIRMENTS: Pain, hip ROM, lumbar ROM, hip and and LE strength, gait, balance  ACTIVITY LIMITATIONS: standing, walking, housework, caring for wife, step navigation  PERSONAL FACTORS: See medical history and pertinent history   REHAB POTENTIAL: Good  CLINICAL DECISION MAKING: Evolving/moderate complexity  EVALUATION COMPLEXITY: Moderate   GOALS:   SHORT TERM GOALS: Target date: 01/25/2024  Wilian will be >75% HEP compliant to improve carryover between sessions and facilitate independent management of condition  Evaluation: ongoing Goal status: INITIAL   LONG TERM GOALS: Target date: 02/22/2024   Yuvan will self report >/= 50% decrease in pain from evaluation to improve function in daily tasks  Evaluation/Baseline: 10/10 max pain 4/1: 10/10 max, average 3-5/10 Goal status: Ongoing   2.  Harles will lower TUG score (MCID 3.4'') to </=12'' to show reduced risk of falls   Evaluation/Baseline: 17'' 4/1:  15'' Goal status: Ongoing   3.  Caster will improve 30'' STS (MCID 2) to >/= 10x (w/ UE?: Y) to show improved LE strength and improved transfers   Evaluation/Baseline: 8x  w/ UE? Y 4/1: 9x w/ UE support Goal status: Ongoing    4.  Cornel will reports significant improvement in ability to stand to cook meals  Evaluation/Baseline: requires frequent seated rest breaks 4/1: little change Goal status: INITIAL   5.  Dylan will show a >/= 18 pt improvement in LEFS score (MCID is ~11% or 9 pts) as a proxy for functional improvement   Evaluation/Baseline: 23 pts Goal status: INITIAL   PLAN: PT FREQUENCY: 1-2x/week  PT DURATION: 8 weeks  PLANNED INTERVENTIONS:  [id number]- PT Re-evaluation, 97110-Therapeutic exercises, 97530- Therapeutic activity, [id number]- Neuromuscular re-education, 97535- Self Care, [id number]- Manual therapy, [id number]- Gait training, [id number]- Aquatic Therapy, [phone]- Electrical  stimulation (manual), [id number]- Vasopneumatic device, [id number]- Traction (mechanical), [id number]- Ionotophoresis 4mg /ml Dexamethasone, Taping, Dry Needling, Joint manipulation, and Spinal manipulation.   Alphonzo Severance PT, DPT 01/24/2024, 11:01 AM  Date of referral: 12/21/2023 Referring provider: Cheryll Cockayne Referring diagnosis? Tear of right gluteus minimus tendon, subsequent encounter [S76.011D], Bilateral leg weakness [R29.898]  Treatment diagnosis? (if different than referring diagnosis)     What was this (referring dx) caused by? Unspecified  Nature of Condition: Chronic (continuous duration > 3 months)   Laterality: Rt  Current Functional Measure Score: LEFS 23/80  Objective measurements identify impairments when they are compared to normal values, the uninvolved extremity, and prior level of function.  [x]  Yes  []  No  Objective assessment of functional ability: Moderate functional limitations   Briefly describe symptoms: LE weakness and R sided hip/LBP which reduces ability to walk, stand, and perform housework  How did symptoms start: chronic with no clear MOI  Average pain intensity:  Last 24 hours: 8/10  Past week: 8/10  How often does the pt experience symptoms? Frequently  How much have the symptoms interfered with usual daily activities?  Quite a bit  How has condition changed since care began at this facility? NA - initial visit  In general, how is the patients overall health? Fair   BACK PAIN (STarT Back Screening Tool) Has pain spread down the leg(s) at some time in the last 2 weeks? N Has there been pain in the shoulder or neck at some time in the last 2 weeks? N Has the pt only walked short distances because of back pain? Y Has patient dressed more slowly because of back pain in the past 2 weeks? Y Does patient think it's not safe for a person with this condition to be physically active? N Does patient have worrying thoughts a lot of the time? N Does patient  feel back pain is terrible and will never get any better? N Has patient stopped enjoying things they usually enjoy? N

## 2024-01-29 ENCOUNTER — Encounter: Payer: Self-pay | Admitting: Physical Therapy

## 2024-01-29 ENCOUNTER — Ambulatory Visit: Payer: Self-pay | Admitting: Physical Therapy

## 2024-01-29 DIAGNOSIS — M25551 Pain in right hip: Secondary | ICD-10-CM

## 2024-01-29 DIAGNOSIS — M6281 Muscle weakness (generalized): Secondary | ICD-10-CM

## 2024-01-29 DIAGNOSIS — M5459 Other low back pain: Secondary | ICD-10-CM

## 2024-01-29 DIAGNOSIS — R2681 Unsteadiness on feet: Secondary | ICD-10-CM

## 2024-01-29 NOTE — Patient Instructions (Signed)

## 2024-01-29 NOTE — Therapy (Signed)
 OUTPATIENT PHYSICAL THERAPY DAILY NOTE  Patient Name: Andrew [name] MRN: [id number] DOB:[birth year], 89 y.o., male Today's Date: 01/29/2024   PT End of Session - 01/29/24 1156     Visit Number 8    Number of Visits --   1-2x/week   Date for PT Re-Evaluation 02/22/24    Authorization Type [employer] MCR - LEFS    Progress Note Due on Visit 10    PT Start Time 1145    PT Stop Time 1226    PT Time Calculation (min) 41 min              Past Medical History:  Diagnosis Date   Colonic polyp 2011 last colo   colo q 13yr - [facility]    Coronary artery disease 02/2009   a. 02/2009 Cath: severe left main and three-vessel-->CABG; b. 12/2016 MVL EF 49%, no isch/infarct.   DEGENERATIVE JOINT DISEASE    Diastolic dysfunction    a. 10/2021 Echo: EF 55-60%, no rwma, mod conc LVH, GrI DD, mildly reduced RV fxn, RVSP 26.8 mmHg. mild AI, and aortic sclerosis.   Diverticulosis of colon    DIVERTICULOSIS, COLON    History of kidney stones    HYPERLIPIDEMIA-MIXED    HYPERTENSION, BENIGN    Interstitial lung disease (HCC)    LBP (low back pain)    s/p decompression 8/14; ESI - [place]; RFA x 3 early 2015   Pacemaker    a. 05/2017: St. Jude (serial number [id number]) pacemaker   Symptomatic bradycardia    a. s/p St. Jude (serial number [id number]) pacemaker 06/16/17   Type II or unspecified type diabetes mellitus without mention of complication, not stated as uncontrolled    VITAMIN D DEFICIENCY    Past Surgical History:  Procedure Laterality Date   CARPAL TUNNEL RELEASE Right    CATARACT EXTRACTION, BILATERAL     R 07/07/13, L 07/21/13   CORONARY ARTERY BYPASS GRAFT  03/17/09   x 4   CYSTOSCOPY WITH STENT PLACEMENT Left 05/10/2021   Procedure: CYSTOSCOPY WITH LEFT  STENT PLACEMENT,LEFT RETROGRADE PYELOGRAM;  Surgeon: Crist Fat, MD;  Location: [facility];  Service: Urology;  Laterality: Left;   CYSTOSCOPY/URETEROSCOPY/HOLMIUM LASER/STENT PLACEMENT Left 06/03/2021   Procedure: CYSTOSCOPY, LEFT  RETROGRADE, URETEROSCOPY,HOLMIUM LASER,STENT EXCHANGE;  Surgeon: Crist Fat, MD;  Location: [facility];  Service: Urology;  Laterality: Left;   INGUINAL HERNIA REPAIR  1980   KNEE ARTHROSCOPY  2007   right   LUMBAR EPIDURAL INJECTION  2013   Has as needed.   LUMBAR LAMINECTOMY/DECOMPRESSION MICRODISCECTOMY N/A 06/19/2013   Procedure: LUMBAR LAMINECTOMY/DECOMPRESSION MICRODISCECTOMY;  Surgeon: Emilee Hero, MD;  Location: [facility] OR;  Service: Orthopedics;  Laterality: N/A;  Lumbar 4-5 decompression   PACEMAKER IMPLANT N/A 06/16/2017   Procedure: Pacemaker Implant;  Surgeon: Marinus Maw, MD;  Location: [facility] INVASIVE CV LAB;  Service: Cardiovascular;  Laterality: N/A;   TRIGGER FINGER RELEASE Right    Patient Active Problem List   Diagnosis Date Noted   Tear of right gluteus minimus tendon 12/21/2023   Anemia 04/02/2023   Hematoma 03/18/2023   Supratherapeutic INR 03/18/2023   Pulmonary fibrosis (HCC) 04/02/2022   Poor balance 12/28/2021   Bilateral leg weakness 04/01/2021   CKD (chronic kidney disease) stage 3, GFR 30-59 ml/min (HCC) 03/31/2021   Pacemaker 07/21/2019   History of deep venous thrombosis (DVT) of distal vein of left lower extremity 08/28/2017   Complete heart block (HCC) - PPM 06/15/2017   Wears hearing aid 09/25/2016  Degeneration of lumbar or lumbosacral intervertebral disc 03/23/2015   Vitamin D deficiency 11/03/2010   Diverticulosis of colon 11/03/2010   Osteoarthritis (arthritis due to wear and tear of joints) 11/03/2010   Dyslipidemia, goal LDL below 70 12/01/2009   CAD, ARTERY BYPASS GRAFT 12/01/2009   DOE (dyspnea on exertion) 12/01/2009   Type 2 diabetes mellitus with neurological manifestations, controlled (HCC)     PCP: Pincus Sanes, MD  REFERRING PROVIDER: Pincus Sanes, MD  THERAPY DIAG:  Other low back pain  Pain in right hip  Muscle weakness  Unsteadiness on feet  REFERRING DIAG: Tear of right gluteus minimus tendon,  subsequent encounter [S76.011D], Bilateral leg weakness [R29.898]   Rationale for Evaluation and Treatment:  Rehabilitation  SUBJECTIVE:  PERTINENT PAST HISTORY:  DM, CAD with bypass 2017, DOE, laminectomy, pacemaker, hx of DVT, complete tear of R glute min, blood thinner      PRECAUTIONS: pacemaker  WEIGHT BEARING RESTRICTIONS No  FALLS:  Has patient fallen in last 6 months? No, Number of falls: 0, but feels unstable  MOI/History of condition:  Onset date: May 2024  SUBJECTIVE STATEMENT  01/29/2024: Pt reports that his hip was aggravated over the weekend.  EVAL:  Andrew [name] is a 89 y.o. male who presents to clinic with chief complaint of R hip pain.  This started suddenly with with no clear injury.  He went out to dinner one night and woke up the next day and was unable to bear weight on his R leg.  He ultimately had an MRI which showed a glute min tear.  He went to another PT location but feels they cater to younger people and push him too hard.  Pt lives with wife who is in a w/c after a fall and hip fracture.  He is the main caregiver.  He lives with his son who is somewhat helpful.  He states he feel like he may catch the toes of his R foot when walking at times.   Red flags:  denies   Pain:  Are you having pain? Yes Pain location: posterior hip  NPRS scale:  2/10 to 10/10 Aggravating factors: standing, walking Relieving factors: sitting, resting Pain description: sharp and aching Stage: Chronic 24 hour pattern: stiff in morning, worse with activity   Occupation: retired  Education administrator: [employer]  Hand Dominance: NA  Patient Goals/Specific Activities: balance, strength, pain   OBJECTIVE:   DIAGNOSTIC FINDINGS:  MRI hip 10/2023-no hematoma, complete tear of right gluteus minimus tendon, mild tendinosis of right gluteus medius tendon.  Mild right greater trochanteric bursitis.  Moderate partial-thickness cartilage loss of the femoral head and acetabulum  bilaterally   GENERAL OBSERVATION/GAIT: Antalgic gait with reduced time in stance on R  SENSATION: Light touch: Appears intact  PALPATION: Minimal TTP R hip  MUSCLE LENGTH: Hamstrings: Right significant, w/ pain restriction; Left subtle restriction   LE MMT:  MMT Right (Eval) Left (Eval)  Hip flexion (L2, L3) 3+* 4  Knee extension (L3) 4 4  Knee flexion 3+ 4  Hip abduction 3* 3+  Hip extension    Hip external rotation    Hip internal rotation    Hip adduction    Ankle dorsiflexion (L4) 4 3-  Ankle plantarflexion (S1) 4 nt  Ankle inversion    Ankle eversion    Great Toe ext (L5)    Grossly     (Blank rows = not tested, score listed is out of 5 possible points.  N =  WNL, D = diminished, C = clear for gross weakness with myotome testing, * = concordant pain with testing)  LE ROM:  ROM Right (Eval) Left (Eval)  Hip flexion Limited to 90 Limited to 90  Hip extension    Hip abduction    Hip adduction    Hip internal rotation 0 no pain 0 no pain  Hip external rotation Limited no pain Limited  No pain  Knee extension    Knee flexion    Ankle dorsiflexion    Ankle plantarflexion    Ankle inversion    Ankle eversion     (Blank rows = not tested, N = WNL, * = concordant pain with testing)  Functional Tests  Eval    30'' STS: 8x  UE used? Y    TUG: 17''                                                      PATIENT SURVEYS:  LEFS: 23/80  TODAY'S TREATMENT:  [facility] Adult PT Treatment  01/29/2024:  Therapeutic Exercise:  nu-step L8 80m while taking subjective and planning session with patient  Therapeutic Activity  In depth discussion with patient regarding progress, typical rehab course for hip replacement, low back vs hip pain, benefits of strengthening, potential benefits of aquatic therapy vs land based therapy.    HOME EXERCISE PROGRAM: Access Code: [email or web address] URL: [email or web address] Date: 01/22/2024 Prepared by: Alphonzo Severance  Exercises - Sit to Stand Without Arm Support  - 1 x daily - 7 x weekly - 3 sets - 10 reps - Standing Hip Abduction with Counter Support  - 1 x daily - 7 x weekly - 3 sets - 10 reps - Step Up  - 1 x daily - 7 x weekly - 3 sets - 10 reps - Lateral Step Up  - 1 x daily - 7 x weekly - 3 sets - 10 reps - Standing Romberg to 3/4 Tandem Stance  - 1 x daily - 7 x weekly - 1 sets - 3 reps - 45-60 sec hold  Treatment priorities   Eval 3/26       balance STS from standard chair       DF assist brace, stool for cooking Balance on unstable surfaces       General LE and core strength as able                          ASSESSMENT:  CLINICAL IMPRESSION:  01/29/2024: Andrew [name] tolerated session well with no adverse reaction.  Pt presents to clinic after seeing his orthopaedic who recommended he trial aquatic therapy.  Pt has made some progress toward his functional goals but continues to have significant R hip and low back pain.  Given his continued pain I think that it is reasonable to try a few visits in the pool.  Discussed options in depth with patient and answered questions.   EVAL:  Andrew [name] is a 89 y.o. male who presents to clinic with signs and sxs consistent with bil LE weakness, low back, and R hip pain.  Pt exam is consistent with main pain generator coming from low back vs R hip.  Pt has minimal recreation of sxs with hip ROM, finds hip flexion relieving, no MOI, and has only minimal TTP  over R lateral hip.  He has a history of loss of disk height throughout lumbar spine, pain in the superior posterior gluteals, and improvement with steroid injections.  He does show significant weakness in R DF.  We talked extensively about possible adaptations in the short term including getting a bar stool to use while cooking and a DF assist brace for the L foot.  His objective exam puts him at increased fall risk per 5x STS and TUG.  He will benefit from PT to address relevant deficits and improve his  safety and comfort during daily tasks.  OBJECTIVE IMPAIRMENTS: Pain, hip ROM, lumbar ROM, hip and and LE strength, gait, balance  ACTIVITY LIMITATIONS: standing, walking, housework, caring for wife, step navigation  PERSONAL FACTORS: See medical history and pertinent history   REHAB POTENTIAL: Good  CLINICAL DECISION MAKING: Evolving/moderate complexity  EVALUATION COMPLEXITY: Moderate   GOALS:   SHORT TERM GOALS: Target date: 01/25/2024  Andrew [name] will be >75% HEP compliant to improve carryover between sessions and facilitate independent management of condition  Evaluation: ongoing Goal status: INITIAL   LONG TERM GOALS: Target date: 02/22/2024   Andrew [name] will self report >/= 50% decrease in pain from evaluation to improve function in daily tasks  Evaluation/Baseline: 10/10 max pain 4/1: 10/10 max, average 3-5/10 Goal status: Ongoing   2.  Andrew [name] will lower TUG score (MCID 3.4'') to </=12'' to show reduced risk of falls   Evaluation/Baseline: 17'' 4/1:  15'' Goal status: Ongoing   3.  Andrew [name] will improve 30'' STS (MCID 2) to >/= 10x (w/ UE?: Y) to show improved LE strength and improved transfers   Evaluation/Baseline: 8x  w/ UE? Y 4/1: 9x w/ UE support Goal status: Ongoing    4.  Andrew [name] will reports significant improvement in ability to stand to cook meals  Evaluation/Baseline: requires frequent seated rest breaks 4/1: little change Goal status: INITIAL   5.  Andrew [name] will show a >/= 18 pt improvement in LEFS score (MCID is ~11% or 9 pts) as a proxy for functional improvement   Evaluation/Baseline: 23 pts Goal status: INITIAL   PLAN: PT FREQUENCY: 1-2x/week  PT DURATION: 8 weeks  PLANNED INTERVENTIONS:  97164- PT Re-evaluation, 97110-Therapeutic exercises, 97530- Therapeutic activity, [id number]- Neuromuscular re-education, 97535- Self Care, [id number]- Manual therapy, [id number]- Gait training, [id number]- Aquatic Therapy, [phone]- Electrical stimulation (manual), [id number]- Vasopneumatic  device, [id number]- Traction (mechanical), [id number]- Ionotophoresis 4mg /ml Dexamethasone, Taping, Dry Needling, Joint manipulation, and Spinal manipulation.   Alphonzo Severance PT, DPT 01/29/2024, 12:57 PM  Date of referral: 12/21/2023 Referring provider: Cheryll Cockayne Referring diagnosis? Tear of right gluteus minimus tendon, subsequent encounter [S76.011D], Bilateral leg weakness [R29.898]  Treatment diagnosis? (if different than referring diagnosis)     What was this (referring dx) caused by? Unspecified  Nature of Condition: Chronic (continuous duration > 3 months)   Laterality: Rt  Current Functional Measure Score: LEFS 23/80  Objective measurements identify impairments when they are compared to normal values, the uninvolved extremity, and prior level of function.  [x]  Yes  []  No  Objective assessment of functional ability: Moderate functional limitations   Briefly describe symptoms: LE weakness and R sided hip/LBP which reduces ability to walk, stand, and perform housework  How did symptoms start: chronic with no clear MOI  Average pain intensity:  Last 24 hours: 8/10  Past week: 8/10  How often does the pt experience symptoms? Frequently  How much have the symptoms interfered with usual daily activities? Quite a bit  How has  condition changed since care began at this facility? NA - initial visit  In general, how is the patients overall health? Fair   BACK PAIN (STarT Back Screening Tool) Has pain spread down the leg(s) at some time in the last 2 weeks? N Has there been pain in the shoulder or neck at some time in the last 2 weeks? N Has the pt only walked short distances because of back pain? Y Has patient dressed more slowly because of back pain in the past 2 weeks? Y Does patient think it's not safe for a person with this condition to be physically active? N Does patient have worrying thoughts a lot of the time? N Does patient feel back pain is terrible and will never  get any better? N Has patient stopped enjoying things they usually enjoy? N

## 2024-01-31 ENCOUNTER — Ambulatory Visit: Payer: Self-pay | Admitting: Physical Therapy

## 2024-01-31 ENCOUNTER — Encounter: Payer: Self-pay | Admitting: Physical Therapy

## 2024-01-31 ENCOUNTER — Ambulatory Visit: Admitting: Physical Therapy

## 2024-01-31 DIAGNOSIS — M25551 Pain in right hip: Secondary | ICD-10-CM

## 2024-01-31 DIAGNOSIS — R2681 Unsteadiness on feet: Secondary | ICD-10-CM

## 2024-01-31 DIAGNOSIS — M5459 Other low back pain: Secondary | ICD-10-CM

## 2024-01-31 DIAGNOSIS — M6281 Muscle weakness (generalized): Secondary | ICD-10-CM

## 2024-01-31 NOTE — Therapy (Unsigned)
 OUTPATIENT PHYSICAL THERAPY DAILY NOTE  Patient Name: Andrew [name] MRN: [id number] DOB:[birth year], 89 y.o., male Today's Date: 02/01/2024   PT End of Session - 01/31/24 1450     Visit Number 9    Number of Visits --   1-2x/week   Date for PT Re-Evaluation 02/22/24    Authorization Type [employer] MCR - LEFS    Progress Note Due on Visit 10    PT Start Time 0255    PT Stop Time 0335    PT Time Calculation (min) 40 min               Past Medical History:  Diagnosis Date   Colonic polyp 2011 last colo   colo q 63yr - [facility]    Coronary artery disease 02/2009   a. 02/2009 Cath: severe left main and three-vessel-->CABG; b. 12/2016 MVL EF 49%, no isch/infarct.   DEGENERATIVE JOINT DISEASE    Diastolic dysfunction    a. 10/2021 Echo: EF 55-60%, no rwma, mod conc LVH, GrI DD, mildly reduced RV fxn, RVSP 26.8 mmHg. mild AI, and aortic sclerosis.   Diverticulosis of colon    DIVERTICULOSIS, COLON    History of kidney stones    HYPERLIPIDEMIA-MIXED    HYPERTENSION, BENIGN    Interstitial lung disease (HCC)    LBP (low back pain)    s/p decompression 8/14; ESI - [place]; RFA x 3 early 2015   Pacemaker    a. 05/2017: St. Jude (serial number [id number]) pacemaker   Symptomatic bradycardia    a. s/p St. Jude (serial number [id number]) pacemaker 06/16/17   Type II or unspecified type diabetes mellitus without mention of complication, not stated as uncontrolled    VITAMIN D DEFICIENCY    Past Surgical History:  Procedure Laterality Date   CARPAL TUNNEL RELEASE Right    CATARACT EXTRACTION, BILATERAL     R 07/07/13, L 07/21/13   CORONARY ARTERY BYPASS GRAFT  03/17/09   x 4   CYSTOSCOPY WITH STENT PLACEMENT Left 05/10/2021   Procedure: CYSTOSCOPY WITH LEFT  STENT PLACEMENT,LEFT RETROGRADE PYELOGRAM;  Surgeon: Crist Fat, MD;  Location: [facility];  Service: Urology;  Laterality: Left;   CYSTOSCOPY/URETEROSCOPY/HOLMIUM LASER/STENT PLACEMENT Left 06/03/2021   Procedure: CYSTOSCOPY, LEFT  RETROGRADE, URETEROSCOPY,HOLMIUM LASER,STENT EXCHANGE;  Surgeon: Crist Fat, MD;  Location: [facility];  Service: Urology;  Laterality: Left;   INGUINAL HERNIA REPAIR  1980   KNEE ARTHROSCOPY  2007   right   LUMBAR EPIDURAL INJECTION  2013   Has as needed.   LUMBAR LAMINECTOMY/DECOMPRESSION MICRODISCECTOMY N/A 06/19/2013   Procedure: LUMBAR LAMINECTOMY/DECOMPRESSION MICRODISCECTOMY;  Surgeon: Emilee Hero, MD;  Location: [facility] OR;  Service: Orthopedics;  Laterality: N/A;  Lumbar 4-5 decompression   PACEMAKER IMPLANT N/A 06/16/2017   Procedure: Pacemaker Implant;  Surgeon: Marinus Maw, MD;  Location: [facility] INVASIVE CV LAB;  Service: Cardiovascular;  Laterality: N/A;   TRIGGER FINGER RELEASE Right    Patient Active Problem List   Diagnosis Date Noted   Tear of right gluteus minimus tendon 12/21/2023   Anemia 04/02/2023   Hematoma 03/18/2023   Supratherapeutic INR 03/18/2023   Pulmonary fibrosis (HCC) 04/02/2022   Poor balance 12/28/2021   Bilateral leg weakness 04/01/2021   CKD (chronic kidney disease) stage 3, GFR 30-59 ml/min (HCC) 03/31/2021   Pacemaker 07/21/2019   History of deep venous thrombosis (DVT) of distal vein of left lower extremity 08/28/2017   Complete heart block (HCC) - PPM 06/15/2017   Wears hearing aid  09/25/2016   Degeneration of lumbar or lumbosacral intervertebral disc 03/23/2015   Vitamin D deficiency 11/03/2010   Diverticulosis of colon 11/03/2010   Osteoarthritis (arthritis due to wear and tear of joints) 11/03/2010   Dyslipidemia, goal LDL below 70 12/01/2009   CAD, ARTERY BYPASS GRAFT 12/01/2009   DOE (dyspnea on exertion) 12/01/2009   Type 2 diabetes mellitus with neurological manifestations, controlled (HCC)     PCP: Pincus Sanes, MD  REFERRING PROVIDER: Pincus Sanes, MD  THERAPY DIAG:  Other low back pain  Pain in right hip  Muscle weakness  Unsteadiness on feet  REFERRING DIAG: Tear of right gluteus minimus tendon,  subsequent encounter [S76.011D], Bilateral leg weakness [R29.898]   Rationale for Evaluation and Treatment:  Rehabilitation  SUBJECTIVE:  PERTINENT PAST HISTORY:  DM, CAD with bypass 2017, DOE, laminectomy, pacemaker, hx of DVT, complete tear of R glute min, blood thinner      PRECAUTIONS: pacemaker  WEIGHT BEARING RESTRICTIONS No  FALLS:  Has patient fallen in last 6 months? No, Number of falls: 0, but feels unstable  MOI/History of condition:  Onset date: May 2024  SUBJECTIVE STATEMENT  02/01/2024: Pt reports he is doing fairly well today.  EVAL:  Andrew [name] is a 89 y.o. male who presents to clinic with chief complaint of R hip pain.  This started suddenly with with no clear injury.  He went out to dinner one night and woke up the next day and was unable to bear weight on his R leg.  He ultimately had an MRI which showed a glute min tear.  He went to another PT location but feels they cater to younger people and push him too hard.  Pt lives with wife who is in a w/c after a fall and hip fracture.  He is the main caregiver.  He lives with his son who is somewhat helpful.  He states he feel like he may catch the toes of his R foot when walking at times.   Red flags:  denies   Pain:  Are you having pain? Yes Pain location: posterior hip  NPRS scale:  2/10 to 10/10 Aggravating factors: standing, walking Relieving factors: sitting, resting Pain description: sharp and aching Stage: Chronic 24 hour pattern: stiff in morning, worse with activity   Occupation: retired  Education administrator: [employer]  Hand Dominance: NA  Patient Goals/Specific Activities: balance, strength, pain   OBJECTIVE:   DIAGNOSTIC FINDINGS:  MRI hip 10/2023-no hematoma, complete tear of right gluteus minimus tendon, mild tendinosis of right gluteus medius tendon.  Mild right greater trochanteric bursitis.  Moderate partial-thickness cartilage loss of the femoral head and acetabulum bilaterally    GENERAL OBSERVATION/GAIT: Antalgic gait with reduced time in stance on R  SENSATION: Light touch: Appears intact  PALPATION: Minimal TTP R hip  MUSCLE LENGTH: Hamstrings: Right significant, w/ pain restriction; Left subtle restriction   LE MMT:  MMT Right (Eval) Left (Eval)  Hip flexion (L2, L3) 3+* 4  Knee extension (L3) 4 4  Knee flexion 3+ 4  Hip abduction 3* 3+  Hip extension    Hip external rotation    Hip internal rotation    Hip adduction    Ankle dorsiflexion (L4) 4 3-  Ankle plantarflexion (S1) 4 nt  Ankle inversion    Ankle eversion    Great Toe ext (L5)    Grossly     (Blank rows = not tested, score listed is out of 5 possible points.  N = WNL, D = diminished, C = clear for gross weakness with myotome testing, * = concordant pain with testing)  LE ROM:  ROM Right (Eval) Left (Eval)  Hip flexion Limited to 90 Limited to 90  Hip extension    Hip abduction    Hip adduction    Hip internal rotation 0 no pain 0 no pain  Hip external rotation Limited no pain Limited  No pain  Knee extension    Knee flexion    Ankle dorsiflexion    Ankle plantarflexion    Ankle inversion    Ankle eversion     (Blank rows = not tested, N = WNL, * = concordant pain with testing)  Functional Tests  Eval    30'' STS: 8x  UE used? Y    TUG: 17''                                                      PATIENT SURVEYS:  LEFS: 23/80  TODAY'S TREATMENT:  TREATMENT 01/31/24:  Aquatic therapy at [facility]- [address] - therapeutic pool temp 92 degrees Pt enters building independently.  Treatment took place in water 3.8 to  4 ft 8 in.feet deep depending upon activity.  Pt entered and exited the pool via stair and handrails    Aquatic Therapy:  Water walking for warm up fwd/lat/bkwds  Hip flexor stretch HS stretch Hip abd Heel raises Squats SL heel raise 15x ea foot NBOS walking w/ DB Cross over walking w/ DB Straight leg  flex/ext   Pt requires the buoyancy of water for active assisted exercises with buoyancy supported for strengthening and AROM exercises. Hydrostatic pressure also supports joints by unweighting joint load by at least 50 % in 3-4 feet depth water. 80% in chest to neck deep water. Water will provide assistance with movement using the current and laminar flow while the buoyancy reduces weight bearing. Pt requires the viscosity of the water for resistance with strengthening exercises.     HOME EXERCISE PROGRAM: Access Code: [email or web address] URL: [email or web address] Date: 01/22/2024 Prepared by: Alphonzo Severance  Exercises - Sit to Stand Without Arm Support  - 1 x daily - 7 x weekly - 3 sets - 10 reps - Standing Hip Abduction with Counter Support  - 1 x daily - 7 x weekly - 3 sets - 10 reps - Step Up  - 1 x daily - 7 x weekly - 3 sets - 10 reps - Lateral Step Up  - 1 x daily - 7 x weekly - 3 sets - 10 reps - Standing Romberg to 3/4 Tandem Stance  - 1 x daily - 7 x weekly - 1 sets - 3 reps - 45-60 sec hold  Treatment priorities   Eval 3/26       balance STS from standard chair       DF assist brace, stool for cooking Balance on unstable surfaces       General LE and core strength as able                          ASSESSMENT:  CLINICAL IMPRESSION:  02/01/2024: Session today focused on hip strengthening and mobility in the aquatic environment for use of buoyancy to offload joints and the viscosity of water as resistance  during therapeutic exercise.  Pt reports complete resolution of pain while in the pool.  He demonstrates improved gait and hip ROM compared to land based exercises.  Patient was able to tolerate all prescribed exercises in the aquatic environment with no adverse effects and reports 2/10 pain at the end of the session. Patient continues to benefit from skilled PT services on land and aquatic based and should be progressed as able to improve functional independence.     EVAL:  Andrew [name] is a 89 y.o. male who presents to clinic with signs and sxs consistent with bil LE weakness, low back, and R hip pain.  Pt exam is consistent with main pain generator coming from low back vs R hip.  Pt has minimal recreation of sxs with hip ROM, finds hip flexion relieving, no MOI, and has only minimal TTP over R lateral hip.  He has a history of loss of disk height throughout lumbar spine, pain in the superior posterior gluteals, and improvement with steroid injections.  He does show significant weakness in R DF.  We talked extensively about possible adaptations in the short term including getting a bar stool to use while cooking and a DF assist brace for the L foot.  His objective exam puts him at increased fall risk per 5x STS and TUG.  He will benefit from PT to address relevant deficits and improve his safety and comfort during daily tasks.  OBJECTIVE IMPAIRMENTS: Pain, hip ROM, lumbar ROM, hip and and LE strength, gait, balance  ACTIVITY LIMITATIONS: standing, walking, housework, caring for wife, step navigation  PERSONAL FACTORS: See medical history and pertinent history   REHAB POTENTIAL: Good  CLINICAL DECISION MAKING: Evolving/moderate complexity  EVALUATION COMPLEXITY: Moderate   GOALS:   SHORT TERM GOALS: Target date: 01/25/2024  Andrew [name] will be >75% HEP compliant to improve carryover between sessions and facilitate independent management of condition  Evaluation: ongoing Goal status: MET   LONG TERM GOALS: Target date: 02/22/2024   Andrew [name] will self report >/= 50% decrease in pain from evaluation to improve function in daily tasks  Evaluation/Baseline: 10/10 max pain 4/1: 10/10 max, average 3-5/10 Goal status: Ongoing   2.  Andrew [name] will lower TUG score (MCID 3.4'') to </=12'' to show reduced risk of falls   Evaluation/Baseline: 17'' 4/1:  15'' Goal status: Ongoing   3.  Andrew [name] will improve 30'' STS (MCID 2) to >/= 10x (w/ UE?: Y) to show improved LE  strength and improved transfers   Evaluation/Baseline: 8x  w/ UE? Y 4/1: 9x w/ UE support Goal status: Ongoing    4.  Andrew [name] will reports significant improvement in ability to stand to cook meals  Evaluation/Baseline: requires frequent seated rest breaks 4/1: little change Goal status: Ongoing   5.  Andrew [name] will show a >/= 18 pt improvement in LEFS score (MCID is ~11% or 9 pts) as a proxy for functional improvement   Evaluation/Baseline: 23 pts Goal status: Ongoing   PLAN: PT FREQUENCY: 1-2x/week  PT DURATION: 8 weeks  PLANNED INTERVENTIONS:  97164- PT Re-evaluation, 97110-Therapeutic exercises, 97530- Therapeutic activity, [id number]- Neuromuscular re-education, 97535- Self Care, [id number]- Manual therapy, [id number]- Gait training, [id number]- Aquatic Therapy, [phone]- Electrical stimulation (manual), [id number]- Vasopneumatic device, [id number]- Traction (mechanical), [id number]- Ionotophoresis 4mg /ml Dexamethasone, Taping, Dry Needling, Joint manipulation, and Spinal manipulation.   Alphonzo Severance PT, DPT 02/01/2024, 7:07 AM  Date of referral: 12/21/2023 Referring provider: Cheryll Cockayne Referring diagnosis? Tear of right gluteus minimus tendon, subsequent encounter [S76.011D], Bilateral leg weakness [R29.898]  Treatment diagnosis? (if different than referring diagnosis)     What was this (referring dx) caused by? Unspecified  Nature of Condition: Chronic (continuous duration > 3 months)   Laterality: Rt  Current Functional Measure Score: LEFS 23/80  Objective measurements identify impairments when they are compared to normal values, the uninvolved extremity, and prior level of function.  [x]  Yes  []  No  Objective assessment of functional ability: Moderate functional limitations   Briefly describe symptoms: LE weakness and R sided hip/LBP which reduces ability to walk, stand, and perform housework  How did symptoms start: chronic with no clear MOI  Average pain intensity:  Last 24 hours:  6/10  Past week: 6/10  How often does the pt experience symptoms? Frequently  How much have the symptoms interfered with usual daily activities? Quite a bit  How has condition changed since care began at this facility? NA - initial visit  In general, how is the patients overall health? Fair   BACK PAIN (STarT Back Screening Tool) Has pain spread down the leg(s) at some time in the last 2 weeks? N Has there been pain in the shoulder or neck at some time in the last 2 weeks? N Has the pt only walked short distances because of back pain? Y Has patient dressed more slowly because of back pain in the past 2 weeks? Y Does patient think it's not safe for a person with this condition to be physically active? N Does patient have worrying thoughts a lot of the time? N Does patient feel back pain is terrible and will never get any better? N Has patient stopped enjoying things they usually enjoy? N

## 2024-02-05 ENCOUNTER — Encounter: Payer: Self-pay | Admitting: Physical Therapy

## 2024-02-07 ENCOUNTER — Encounter: Payer: Self-pay | Admitting: Physical Therapy

## 2024-02-07 ENCOUNTER — Ambulatory Visit: Admitting: Physical Therapy

## 2024-02-07 DIAGNOSIS — M6281 Muscle weakness (generalized): Secondary | ICD-10-CM

## 2024-02-07 DIAGNOSIS — M5459 Other low back pain: Secondary | ICD-10-CM

## 2024-02-07 DIAGNOSIS — M25551 Pain in right hip: Secondary | ICD-10-CM

## 2024-02-07 DIAGNOSIS — R2681 Unsteadiness on feet: Secondary | ICD-10-CM

## 2024-02-07 NOTE — Therapy (Signed)
 Progress Note Reporting Period 3/7 to 4/17  See note below for Objective Data and Assessment of Progress/Goals.     Patient Name: Andrew [name] MRN: [id number] DOB:[birth year], 89 y.o., male Today's Date: 02/07/2024   PT End of Session - 02/07/24 1519     Visit Number 10    Number of Visits --   1-2x/week   Date for PT Re-Evaluation 02/22/24    Authorization Type [employer] MCR - LEFS    Progress Note Due on Visit 10    PT Start Time 0325    PT Stop Time 0405    PT Time Calculation (min) 40 min                Past Medical History:  Diagnosis Date   Colonic polyp 2011 last colo   colo q 31yr - [facility]    Coronary artery disease 02/2009   a. 02/2009 Cath: severe left main and three-vessel-->CABG; b. 12/2016 MVL EF 49%, no isch/infarct.   DEGENERATIVE JOINT DISEASE    Diastolic dysfunction    a. 10/2021 Echo: EF 55-60%, no rwma, mod conc LVH, GrI DD, mildly reduced RV fxn, RVSP 26.8 mmHg. mild AI, and aortic sclerosis.   Diverticulosis of colon    DIVERTICULOSIS, COLON    History of kidney stones    HYPERLIPIDEMIA-MIXED    HYPERTENSION, BENIGN    Interstitial lung disease (HCC)    LBP (low back pain)    s/p decompression 8/14; ESI - [place]; RFA x 3 early 2015   Pacemaker    a. 05/2017: St. Jude (serial number [id number]) pacemaker   Symptomatic bradycardia    a. s/p St. Jude (serial number [id number]) pacemaker 06/16/17   Type II or unspecified type diabetes mellitus without mention of complication, not stated as uncontrolled    VITAMIN D DEFICIENCY    Past Surgical History:  Procedure Laterality Date   CARPAL TUNNEL RELEASE Right    CATARACT EXTRACTION, BILATERAL     R 07/07/13, L 07/21/13   CORONARY ARTERY BYPASS GRAFT  03/17/09   x 4   CYSTOSCOPY WITH STENT PLACEMENT Left 05/10/2021   Procedure: CYSTOSCOPY WITH LEFT  STENT PLACEMENT,LEFT RETROGRADE PYELOGRAM;  Surgeon: Andrez Banker, MD;  Location: [facility];  Service: Urology;  Laterality: Left;    CYSTOSCOPY/URETEROSCOPY/HOLMIUM LASER/STENT PLACEMENT Left 06/03/2021   Procedure: CYSTOSCOPY, LEFT RETROGRADE, URETEROSCOPY,HOLMIUM LASER,STENT EXCHANGE;  Surgeon: Andrez Banker, MD;  Location: [facility];  Service: Urology;  Laterality: Left;   INGUINAL HERNIA REPAIR  1980   KNEE ARTHROSCOPY  2007   right   LUMBAR EPIDURAL INJECTION  2013   Has as needed.   LUMBAR LAMINECTOMY/DECOMPRESSION MICRODISCECTOMY N/A 06/19/2013   Procedure: LUMBAR LAMINECTOMY/DECOMPRESSION MICRODISCECTOMY;  Surgeon: Estevan Helper, MD;  Location: [facility] OR;  Service: Orthopedics;  Laterality: N/A;  Lumbar 4-5 decompression   PACEMAKER IMPLANT N/A 06/16/2017   Procedure: Pacemaker Implant;  Surgeon: Tammie Fall, MD;  Location: [facility] INVASIVE CV LAB;  Service: Cardiovascular;  Laterality: N/A;   TRIGGER FINGER RELEASE Right    Patient Active Problem List   Diagnosis Date Noted   Tear of right gluteus minimus tendon 12/21/2023   Anemia 04/02/2023   Hematoma 03/18/2023   Supratherapeutic INR 03/18/2023   Pulmonary fibrosis (HCC) 04/02/2022   Poor balance 12/28/2021   Bilateral leg weakness 04/01/2021   CKD (chronic kidney disease) stage 3, GFR 30-59 ml/min (HCC) 03/31/2021   Pacemaker 07/21/2019   History of deep venous thrombosis (DVT) of distal vein of left  lower extremity 08/28/2017   Complete heart block (HCC) - PPM 06/15/2017   Wears hearing aid 09/25/2016   Degeneration of lumbar or lumbosacral intervertebral disc 03/23/2015   Vitamin D deficiency 11/03/2010   Diverticulosis of colon 11/03/2010   Osteoarthritis (arthritis due to wear and tear of joints) 11/03/2010   Dyslipidemia, goal LDL below 70 12/01/2009   CAD, ARTERY BYPASS GRAFT 12/01/2009   DOE (dyspnea on exertion) 12/01/2009   Type 2 diabetes mellitus with neurological manifestations, controlled (HCC)     PCP: Pincus Sanes, MD  REFERRING PROVIDER: Pincus Sanes, MD  THERAPY DIAG:  Other low back pain  Pain in right hip  Muscle  weakness  Unsteadiness on feet  REFERRING DIAG: Tear of right gluteus minimus tendon, subsequent encounter [S76.011D], Bilateral leg weakness [R29.898]   Rationale for Evaluation and Treatment:  Rehabilitation  SUBJECTIVE:  PERTINENT PAST HISTORY:  DM, CAD with bypass 2017, DOE, laminectomy, pacemaker, hx of DVT, complete tear of R glute min, blood thinner      PRECAUTIONS: pacemaker  WEIGHT BEARING RESTRICTIONS No  FALLS:  Has patient fallen in last 6 months? No, Number of falls: 0, but feels unstable  MOI/History of condition:  Onset date: May 2024  SUBJECTIVE STATEMENT  02/07/2024: Pt reports he is doing fairly well today.  EVAL:  Andrew [name] is a 89 y.o. male who presents to clinic with chief complaint of R hip pain.  This started suddenly with with no clear injury.  He went out to dinner one night and woke up the next day and was unable to bear weight on his R leg.  He ultimately had an MRI which showed a glute min tear.  He went to another PT location but feels they cater to younger people and push him too hard.  Pt lives with wife who is in a w/c after a fall and hip fracture.  He is the main caregiver.  He lives with his son who is somewhat helpful.  He states he feel like he may catch the toes of his R foot when walking at times.   Red flags:  denies   Pain:  Are you having pain? Yes Pain location: posterior hip  NPRS scale:  2/10 to 10/10 Aggravating factors: standing, walking Relieving factors: sitting, resting Pain description: sharp and aching Stage: Chronic 24 hour pattern: stiff in morning, worse with activity   Occupation: retired  Education administrator: [employer]  Hand Dominance: NA  Patient Goals/Specific Activities: balance, strength, pain   OBJECTIVE:   DIAGNOSTIC FINDINGS:  MRI hip 10/2023-no hematoma, complete tear of right gluteus minimus tendon, mild tendinosis of right gluteus medius tendon.  Mild right greater trochanteric bursitis.   Moderate partial-thickness cartilage loss of the femoral head and acetabulum bilaterally   GENERAL OBSERVATION/GAIT: Antalgic gait with reduced time in stance on R  SENSATION: Light touch: Appears intact  PALPATION: Minimal TTP R hip  MUSCLE LENGTH: Hamstrings: Right significant, w/ pain restriction; Left subtle restriction   LE MMT:  MMT Right (Eval) Left (Eval)  Hip flexion (L2, L3) 3+* 4  Knee extension (L3) 4 4  Knee flexion 3+ 4  Hip abduction 3* 3+  Hip extension    Hip external rotation    Hip internal rotation    Hip adduction    Ankle dorsiflexion (L4) 4 3-  Ankle plantarflexion (S1) 4 nt  Ankle inversion    Ankle eversion    Great Toe ext (L5)    Grossly     (  Blank rows = not tested, score listed is out of 5 possible points.  N = WNL, D = diminished, C = clear for gross weakness with myotome testing, * = concordant pain with testing)  LE ROM:  ROM Right (Eval) Left (Eval)  Hip flexion Limited to 90 Limited to 90  Hip extension    Hip abduction    Hip adduction    Hip internal rotation 0 no pain 0 no pain  Hip external rotation Limited no pain Limited  No pain  Knee extension    Knee flexion    Ankle dorsiflexion    Ankle plantarflexion    Ankle inversion    Ankle eversion     (Blank rows = not tested, N = WNL, * = concordant pain with testing)  Functional Tests  Eval    30'' STS: 8x  UE used? Y    TUG: 17''                                                      PATIENT SURVEYS:  LEFS: 23/80  TODAY'S TREATMENT:  TREATMENT 02/05/24:  Therapeutic Activity  Collecting LEFS and reviewing with pt  Aquatic therapy at [facility]- [address] - therapeutic pool temp 92 degrees Pt enters building independently.  Treatment took place in water 3.8 to  4 ft 8 in.feet deep depending upon activity.  Pt entered and exited the pool via stair and handrails    Aquatic Therapy:  Water walking for warm up fwd/lat/bkwds  Hip  flexor stretch HS stretch Hip abd Heel raises Squats SL heel raise 15x ea foot NBOS walking w/ DB Cross over walking w/ DB Straight leg flex/ext Alternating mini lunge   Pt requires the buoyancy of water for active assisted exercises with buoyancy supported for strengthening and AROM exercises. Hydrostatic pressure also supports joints by unweighting joint load by at least 50 % in 3-4 feet depth water. 80% in chest to neck deep water. Water will provide assistance with movement using the current and laminar flow while the buoyancy reduces weight bearing. Pt requires the viscosity of the water for resistance with strengthening exercises.     HOME EXERCISE PROGRAM: Access Code: [email or web address] URL: [email or web address] Date: 01/22/2024 Prepared by: Alphonzo Severance  Exercises - Sit to Stand Without Arm Support  - 1 x daily - 7 x weekly - 3 sets - 10 reps - Standing Hip Abduction with Counter Support  - 1 x daily - 7 x weekly - 3 sets - 10 reps - Step Up  - 1 x daily - 7 x weekly - 3 sets - 10 reps - Lateral Step Up  - 1 x daily - 7 x weekly - 3 sets - 10 reps - Standing Romberg to 3/4 Tandem Stance  - 1 x daily - 7 x weekly - 1 sets - 3 reps - 45-60 sec hold  Treatment priorities   Eval 3/26       balance STS from standard chair       DF assist brace, stool for cooking Balance on unstable surfaces       General LE and core strength as able                          ASSESSMENT:  CLINICAL IMPRESSION:  02/07/2024: Overall Mr. Whilden has made fair progress with PT.  Upon goal re-check his average pain has improved somewhat but can still be quite high at times.  He has shown improvements in LE functional strength with improved reps with 30'' STS.  His TUG score has improved as well.  Unfortunately, land based therapy did increase his pain at times.  The last 2 visits exercise was performed in the pool which was significantly easier on his back and hips and he was able to  work on strengthening with no increase in pain.  Extending POC to allow for more aquatic visits to continue progress toward goals.   EVAL:  Andrew [name] is a 89 y.o. male who presents to clinic with signs and sxs consistent with bil LE weakness, low back, and R hip pain.  Pt exam is consistent with main pain generator coming from low back vs R hip.  Pt has minimal recreation of sxs with hip ROM, finds hip flexion relieving, no MOI, and has only minimal TTP over R lateral hip.  He has a history of loss of disk height throughout lumbar spine, pain in the superior posterior gluteals, and improvement with steroid injections.  He does show significant weakness in R DF.  We talked extensively about possible adaptations in the short term including getting a bar stool to use while cooking and a DF assist brace for the L foot.  His objective exam puts him at increased fall risk per 5x STS and TUG.  He will benefit from PT to address relevant deficits and improve his safety and comfort during daily tasks.  OBJECTIVE IMPAIRMENTS: Pain, hip ROM, lumbar ROM, hip and and LE strength, gait, balance  ACTIVITY LIMITATIONS: standing, walking, housework, caring for wife, step navigation  PERSONAL FACTORS: See medical history and pertinent history   REHAB POTENTIAL: Good  CLINICAL DECISION MAKING: Evolving/moderate complexity  EVALUATION COMPLEXITY: Moderate   GOALS:   SHORT TERM GOALS: Target date: 01/25/2024  Andrew [name] will be >75% HEP compliant to improve carryover between sessions and facilitate independent management of condition  Evaluation: ongoing Goal status: MET   LONG TERM GOALS: Target date: 02/22/2024 extended to 6/12   Andrew [name] will self report >/= 50% decrease in pain from evaluation to improve function in daily tasks  Evaluation/Baseline: 10/10 max pain 4/1: 10/10 max, average 3-5/10 4/17: 25% Improvement but pain can still be quite severe Goal status: Ongoing   2.  Andrew [name] will lower TUG score  (MCID 3.4'') to </=12'' to show reduced risk of falls   Evaluation/Baseline: 17'' 4/1:  15'' Goal status: Ongoing   3.  Andrew [name] will improve 30'' STS (MCID 2) to >/= 10x (w/ UE?: Y) to show improved LE strength and improved transfers   Evaluation/Baseline: 8x  w/ UE? Y 4/1: 9x w/ UE support Goal status: Ongoing    4.  Andrew [name] will reports significant improvement in ability to stand to cook meals  Evaluation/Baseline: requires frequent seated rest breaks 4/1: little change Goal status: Ongoing   5.  Andrew [name] will show a >/= 18 pt improvement in LEFS score (MCID is ~11% or 9 pts) as a proxy for functional improvement   Evaluation/Baseline: 23 pts 4/17: 33/80 Goal status: Ongoing   PLAN: PT FREQUENCY: 1-2x/week  PT DURATION: 8 weeks  PLANNED INTERVENTIONS:  [id number]- PT Re-evaluation, 97110-Therapeutic exercises, 97530- Therapeutic activity, 97112- Neuromuscular re-education, 97535- Self Care, 40981- Manual therapy, [id number]- Gait training, [id number]- Aquatic Therapy, [phone]- Electrical stimulation (manual), [id number]- Vasopneumatic device, [id number]- Traction (mechanical),  96045- Ionotophoresis 4mg /ml Dexamethasone, Taping, Dry Needling, Joint manipulation, and Spinal manipulation.   Lesleigh Rash PT, DPT 02/07/2024, 3:19 PM  Date of referral: 12/21/2023 Referring provider: Oma Bias Referring diagnosis? Tear of right gluteus minimus tendon, subsequent encounter [S76.011D], Bilateral leg weakness [R29.898]  Treatment diagnosis? (if different than referring diagnosis)     What was this (referring dx) caused by? Unspecified  Nature of Condition: Chronic (continuous duration > 3 months)   Laterality: Rt  Current Functional Measure Score: LEFS 23/80  Objective measurements identify impairments when they are compared to normal values, the uninvolved extremity, and prior level of function.  [x]  Yes  []  No  Objective assessment of functional ability: Moderate functional  limitations   Briefly describe symptoms: LE weakness and R sided hip/LBP which reduces ability to walk, stand, and perform housework  How did symptoms start: chronic with no clear MOI  Average pain intensity:  Last 24 hours: 6/10  Past week: 6/10  How often does the pt experience symptoms? Frequently  How much have the symptoms interfered with usual daily activities? Quite a bit  How has condition changed since care began at this facility? NA - initial visit  In general, how is the patients overall health? Fair   BACK PAIN (STarT Back Screening Tool) Has pain spread down the leg(s) at some time in the last 2 weeks? N Has there been pain in the shoulder or neck at some time in the last 2 weeks? N Has the pt only walked short distances because of back pain? Y Has patient dressed more slowly because of back pain in the past 2 weeks? Y Does patient think it's not safe for a person with this condition to be physically active? N Does patient have worrying thoughts a lot of the time? N Does patient feel back pain is terrible and will never get any better? N Has patient stopped enjoying things they usually enjoy? N

## 2024-02-13 ENCOUNTER — Encounter: Payer: Self-pay | Admitting: Physical Therapy

## 2024-02-20 ENCOUNTER — Ambulatory Visit: Attending: Internal Medicine

## 2024-02-20 DIAGNOSIS — Z5181 Encounter for therapeutic drug level monitoring: Secondary | ICD-10-CM

## 2024-02-20 DIAGNOSIS — I82409 Acute embolism and thrombosis of unspecified deep veins of unspecified lower extremity: Secondary | ICD-10-CM

## 2024-02-20 LAB — POCT INR: INR: 2 (ref 2.0–3.0)

## 2024-02-20 NOTE — Patient Instructions (Signed)
 Take 1 tablet Daily, except 2 tablets every Monday and Friday.    INR in 8 weeks.  [phone]

## 2024-03-10 ENCOUNTER — Other Ambulatory Visit: Payer: Self-pay | Admitting: Internal Medicine

## 2024-03-11 ENCOUNTER — Ambulatory Visit (HOSPITAL_BASED_OUTPATIENT_CLINIC_OR_DEPARTMENT_OTHER): Admitting: Physical Therapy

## 2024-03-12 ENCOUNTER — Ambulatory Visit: Payer: Medicare Other

## 2024-03-12 DIAGNOSIS — I459 Conduction disorder, unspecified: Secondary | ICD-10-CM | POA: Diagnosis not present

## 2024-03-13 LAB — CUP PACEART REMOTE DEVICE CHECK
Battery Remaining Longevity: 49 mo
Battery Remaining Percentage: 39 %
Battery Voltage: 2.96 V
Brady Statistic AP VP Percent: 16 %
Brady Statistic AP VS Percent: 1 %
Brady Statistic AS VP Percent: 84 %
Brady Statistic AS VS Percent: 1 %
Brady Statistic RA Percent Paced: 16 %
Brady Statistic RV Percent Paced: 99 %
Date Time Interrogation Session: 20250522024217
Implantable Lead Connection Status: 753985
Implantable Lead Connection Status: 753985
Implantable Lead Implant Date: 20180825
Implantable Lead Implant Date: 20180825
Implantable Lead Location: 753859
Implantable Lead Location: 753860
Implantable Pulse Generator Implant Date: 20180825
Lead Channel Impedance Value: 440 Ohm
Lead Channel Impedance Value: 580 Ohm
Lead Channel Pacing Threshold Amplitude: 0.5 V
Lead Channel Pacing Threshold Amplitude: 0.5 V
Lead Channel Pacing Threshold Pulse Width: 0.5 ms
Lead Channel Pacing Threshold Pulse Width: 0.5 ms
Lead Channel Sensing Intrinsic Amplitude: 4 mV
Lead Channel Sensing Intrinsic Amplitude: 4.6 mV
Lead Channel Setting Pacing Amplitude: 0.75 V
Lead Channel Setting Pacing Amplitude: 2 V
Lead Channel Setting Pacing Pulse Width: 0.5 ms
Lead Channel Setting Sensing Sensitivity: 4 mV
Pulse Gen Model: 2272
Pulse Gen Serial Number: 8939923

## 2024-03-16 ENCOUNTER — Ambulatory Visit: Payer: Self-pay | Admitting: Internal Medicine

## 2024-03-19 ENCOUNTER — Ambulatory Visit (HOSPITAL_BASED_OUTPATIENT_CLINIC_OR_DEPARTMENT_OTHER): Payer: Self-pay | Admitting: Physical Therapy

## 2024-03-25 ENCOUNTER — Ambulatory Visit (HOSPITAL_BASED_OUTPATIENT_CLINIC_OR_DEPARTMENT_OTHER): Payer: Self-pay | Admitting: Physical Therapy

## 2024-03-31 ENCOUNTER — Encounter: Payer: Self-pay | Admitting: Internal Medicine

## 2024-03-31 NOTE — Patient Instructions (Addendum)
      Blood work was ordered.       Medications changes include :   None    A referral was ordered for orthopedics and someone will call you to schedule an appointment.     Return in about 6 months (around 10/01/2024) for Physical Exam.

## 2024-03-31 NOTE — Progress Notes (Unsigned)
 Subjective:    [name], male    DOB: May 25, 1934, 89 y.o.   MRN: [id number]     HPI Andrew [name] is here for follow up of his chronic medical problems.  Following with orthopedics for lower back - gets injections in L4-5 every few months.  That does help.   He has chronic right buttock pain - gluteus minimus tendon tear.  Was not felt to be a surgical candidate.  Pain has not improved does limit him.  Decreased weight to 140lb - usually 175 lb --- appetite is decreased.  He is not as active because of the back and buttock pain.  Medications and allergies reviewed with patient and updated if appropriate.  Current Outpatient Medications on File Prior to Visit  Medication Sig Dispense Refill   Acetaminophen  (TYLENOL  8 HOUR PO) Take 1,000 mg by mouth 3 (three) times daily.     aspirin  EC 81 MG tablet Take 81 mg by mouth daily.     atorvastatin  (LIPITOR) 20 MG tablet TAKE 1 TABLET DAILY AT     6:00PM 90 tablet 1   carvedilol  (COREG ) 6.25 MG tablet Take 1 tablet (6.25 mg total) by mouth 2 (two) times daily with a meal. 180 tablet 3   cholecalciferol  (VITAMIN D ) 1000 UNITS tablet Take 1,000 Units by mouth daily.     DULoxetine (CYMBALTA) 30 MG capsule Take 30 mg by mouth every morning.     HYDROcodone -acetaminophen  (NORCO/VICODIN) 5-325 MG tablet Take 1 tablet by mouth every 6 (six) hours as needed for moderate pain.     metFORMIN  (GLUCOPHAGE -XR) 500 MG 24 hr tablet TAKE 1 TABLET TWICE A DAY 180 tablet 3   Multiple Vitamin (MULTIVITAMIN WITH MINERALS) TABS tablet Take 1 tablet by mouth daily.     niacin  (NIASPAN ) 1000 MG CR tablet TAKE 1 TABLET AT BEDTIME 90 tablet 3   TURMERIC CURCUMIN PO Take 2,000 mg by mouth in the morning and at bedtime.     warfarin (JANTOVEN ) 2 MG tablet TAKE 1 TO 2 TABLETS DAILY AS DIRECTED BY COUMADIN  CLINIC 110 tablet 1   No current facility-administered medications on file prior to visit.     Review of Systems  Constitutional:   Positive for appetite change (decreased) and unexpected weight change. Negative for chills, diaphoresis and fever.  Respiratory:  Negative for cough, shortness of breath and wheezing.   Cardiovascular:  Negative for chest pain, palpitations and leg swelling.  Gastrointestinal:  Negative for abdominal pain, blood in stool (no melena), constipation and diarrhea.  Musculoskeletal:  Positive for back pain.       Right buttock pain  Neurological:  Negative for light-headedness and headaches.       Objective:   Vitals:   04/01/24 0752  BP: (!) 110/58  Pulse: 60  Temp: 98 F (36.7 C)  SpO2: 97%   BP Readings from Last 3 Encounters:  04/01/24 (!) 110/58  12/21/23 128/88  10/02/23 114/60   Wt Readings from Last 3 Encounters:  04/01/24 153 lb (69.4 kg)  12/21/23 161 lb 12.8 oz (73.4 kg)  10/02/23 168 lb (76.2 kg)   Body mass index is 23.26 kg/m.    Physical Exam Constitutional:      General: He is not in acute distress.    Appearance: Normal appearance. He is not ill-appearing.  HENT:     Head: Normocephalic and atraumatic.  Eyes:     Conjunctiva/sclera: Conjunctivae normal.  Cardiovascular:  Rate and Rhythm: Normal rate and regular rhythm.     Heart sounds: Normal heart sounds.  Pulmonary:     Effort: Pulmonary effort is normal. No respiratory distress.     Breath sounds: Normal breath sounds. No wheezing or rales.  Musculoskeletal:     Right lower leg: No edema.     Left lower leg: No edema.  Skin:    General: Skin is warm and dry.     Findings: No rash.  Neurological:     Mental Status: He is alert. Mental status is at baseline.  Psychiatric:        Mood and Affect: Mood normal.        Lab Results  Component Value Date   WBC 6.5 10/02/2023   HGB 11.8 (L) 10/02/2023   HCT 35.7 (L) 10/02/2023   PLT 209.0 10/02/2023   GLUCOSE 183 (H) 10/02/2023   CHOL 131 10/02/2023   TRIG 166.0 (H) 10/02/2023   HDL 53.60 10/02/2023   LDLDIRECT 72.8 04/18/2011    LDLCALC 44 10/02/2023   ALT 15 10/02/2023   AST 18 10/02/2023   NA 142 10/02/2023   K 4.4 10/02/2023   CL 107 10/02/2023   CREATININE 1.43 10/02/2023   BUN 42 (H) 10/02/2023   CO2 27 10/02/2023   TSH 1.12 10/01/2019   PSA 0.6 09/22/2010   INR 2.0 02/20/2024   HGBA1C 6.6 (H) 10/02/2023   MICROALBUR 1.3 04/03/2022     Assessment & Plan:    See Problem List for Assessment and Plan of chronic medical problems.

## 2024-04-01 ENCOUNTER — Ambulatory Visit (INDEPENDENT_AMBULATORY_CARE_PROVIDER_SITE_OTHER): Payer: Medicare Other | Admitting: Internal Medicine

## 2024-04-01 VITALS — BP 110/58 | HR 60 | Temp 98.0°F | Ht 68.0 in | Wt 153.0 lb

## 2024-04-01 DIAGNOSIS — R634 Abnormal weight loss: Secondary | ICD-10-CM

## 2024-04-01 DIAGNOSIS — N1832 Chronic kidney disease, stage 3b: Secondary | ICD-10-CM | POA: Diagnosis not present

## 2024-04-01 DIAGNOSIS — Z86718 Personal history of other venous thrombosis and embolism: Secondary | ICD-10-CM

## 2024-04-01 DIAGNOSIS — Z7984 Long term (current) use of oral hypoglycemic drugs: Secondary | ICD-10-CM | POA: Diagnosis not present

## 2024-04-01 DIAGNOSIS — S76011D Strain of muscle, fascia and tendon of right hip, subsequent encounter: Secondary | ICD-10-CM

## 2024-04-01 DIAGNOSIS — E785 Hyperlipidemia, unspecified: Secondary | ICD-10-CM | POA: Diagnosis not present

## 2024-04-01 DIAGNOSIS — E1149 Type 2 diabetes mellitus with other diabetic neurological complication: Secondary | ICD-10-CM

## 2024-04-01 DIAGNOSIS — Z125 Encounter for screening for malignant neoplasm of prostate: Secondary | ICD-10-CM | POA: Diagnosis not present

## 2024-04-01 DIAGNOSIS — I25708 Atherosclerosis of coronary artery bypass graft(s), unspecified, with other forms of angina pectoris: Secondary | ICD-10-CM

## 2024-04-01 NOTE — Assessment & Plan Note (Signed)
 Chronic   Lab Results  Component Value Date   HGBA1C 6.6 (H) 10/02/2023   Sugars controlled Check A1c Continue metformin  XR 500 mg twice daily regular exercise, diabetic diet

## 2024-04-01 NOTE — Assessment & Plan Note (Signed)
 Chronic Has been working with orthopedics-not thought to be surgical candidate Injections have not helped PT has not helped Would like to see another orthopedic for second opinion-referral ordered

## 2024-04-01 NOTE — Assessment & Plan Note (Signed)
 Chronic Stable CMP, CBC

## 2024-04-01 NOTE — Assessment & Plan Note (Addendum)
 Subacute Decreased appetite - eating less - weight has been decreasing Advised increasing calorie intake Monitor weight at home and if continues to decrease needs to come back sooner Check tsh, cbc, cmp

## 2024-04-01 NOTE — Assessment & Plan Note (Signed)
 Chronic DVT Management per cardiology On long-term anticoagulation with warfarin CBC today

## 2024-04-01 NOTE — Assessment & Plan Note (Signed)
 Chronic Regular exercise and healthy diet encouraged Check lipids  Continue atorvastatin 20 mg daily, Niacin 1000 mg nightly

## 2024-04-01 NOTE — Assessment & Plan Note (Signed)
 Chronic Follow with cardiology No symptoms consistent with angina Continue aspirin  81 mg daily, atorvastatin  20 mg daily, Coreg  6.25 mg daily, Niaspan  1000 mg at bedtime, warfarin

## 2024-04-03 ENCOUNTER — Ambulatory Visit (HOSPITAL_BASED_OUTPATIENT_CLINIC_OR_DEPARTMENT_OTHER): Payer: Self-pay | Admitting: Orthopaedic Surgery

## 2024-04-03 ENCOUNTER — Ambulatory Visit (HOSPITAL_BASED_OUTPATIENT_CLINIC_OR_DEPARTMENT_OTHER): Admitting: Student

## 2024-04-03 ENCOUNTER — Encounter (HOSPITAL_BASED_OUTPATIENT_CLINIC_OR_DEPARTMENT_OTHER): Payer: Self-pay | Admitting: Student

## 2024-04-03 ENCOUNTER — Encounter (HOSPITAL_BASED_OUTPATIENT_CLINIC_OR_DEPARTMENT_OTHER): Payer: Self-pay

## 2024-04-03 DIAGNOSIS — S76011A Strain of muscle, fascia and tendon of right hip, initial encounter: Secondary | ICD-10-CM

## 2024-04-03 NOTE — Progress Notes (Signed)
 Chief Complaint: Right hip pain     History of Present Illness:    Andrew [name] is a 89 y.o. male presents today with ongoing right hip pain now for the better part of this year.  He states that he 1 day woke up and did feel significant pain and weakness in the right hip with difficulty utilizing this.  He has been seen by Dr. Abazebo who has been treating him for his right hip.  He has had multiple epidural injections without any relief.  The pain is centered about the posterior buttocks with some radiation laterally.  He does have a significant limp with his gait.  He has having a hard time laying on the side.  He currently lives with his wife who is status post stroke who he is needing to be very active in order to assist.  He has had multiple injections into the lateral hip.  With some temporary relief.    PMH/PSH/Family History/Social History/Meds/Allergies:    Past Medical History:  Diagnosis Date   Colonic polyp 2011 last colo   colo q 60yr - [facility]    Coronary artery disease 02/2009   a. 02/2009 Cath: severe left main and three-vessel-->CABG; b. 12/2016 MVL EF 49%, no isch/infarct.   DEGENERATIVE JOINT DISEASE    Diastolic dysfunction    a. 10/2021 Echo: EF 55-60%, no rwma, mod conc LVH, GrI DD, mildly reduced RV fxn, RVSP 26.8 mmHg. mild AI, and aortic sclerosis.   Diverticulosis of colon    DIVERTICULOSIS, COLON    History of kidney stones    HYPERLIPIDEMIA-MIXED    HYPERTENSION, BENIGN    Interstitial lung disease (HCC)    LBP (low back pain)    s/p decompression 8/14; ESI - [place]; RFA x 3 early 2015   Pacemaker    a. 05/2017: St. Jude (serial number [id number]) pacemaker   Symptomatic bradycardia    a. s/p St. Jude (serial number [id number]) pacemaker 06/16/17   Type II or unspecified type diabetes mellitus without mention of complication, not stated as uncontrolled    VITAMIN D  DEFICIENCY    Past Surgical History:  Procedure Laterality Date   CARPAL TUNNEL  RELEASE Right    CATARACT EXTRACTION, BILATERAL     R 07/07/13, L 07/21/13   CORONARY ARTERY BYPASS GRAFT  03/17/09   x 4   CYSTOSCOPY WITH STENT PLACEMENT Left 05/10/2021   Procedure: CYSTOSCOPY WITH LEFT  STENT PLACEMENT,LEFT RETROGRADE PYELOGRAM;  Surgeon: Andrez Banker, MD;  Location: [facility];  Service: Urology;  Laterality: Left;   CYSTOSCOPY/URETEROSCOPY/HOLMIUM LASER/STENT PLACEMENT Left 06/03/2021   Procedure: CYSTOSCOPY, LEFT RETROGRADE, URETEROSCOPY,HOLMIUM LASER,STENT EXCHANGE;  Surgeon: Andrez Banker, MD;  Location: [facility];  Service: Urology;  Laterality: Left;   INGUINAL HERNIA REPAIR  1980   KNEE ARTHROSCOPY  2007   right   LUMBAR EPIDURAL INJECTION  2013   Has as needed.   LUMBAR LAMINECTOMY/DECOMPRESSION MICRODISCECTOMY N/A 06/19/2013   Procedure: LUMBAR LAMINECTOMY/DECOMPRESSION MICRODISCECTOMY;  Surgeon: Estevan Helper, MD;  Location: [facility];  Service: Orthopedics;  Laterality: N/A;  Lumbar 4-5 decompression   PACEMAKER IMPLANT N/A 06/16/2017   Procedure: Pacemaker Implant;  Surgeon: Tammie Fall, MD;  Location: [facility] INVASIVE CV LAB;  Service: Cardiovascular;  Laterality: N/A;   TRIGGER FINGER RELEASE Right    Social History   Socioeconomic History   Marital status: Married    Spouse name: Mariah Shines   Number of children: 3   Years of education: Not  on file   Highest education level: 12th grade  Occupational History   Not on file  Tobacco Use   Smoking status: Never   Smokeless tobacco: Never   Tobacco comments:    Married, 3 grown children. Retired 04/2002 from [employer] work  Psychologist, educational Use   Vaping status: Never Used  Substance and Sexual Activity   Alcohol use: Yes    Comment: RARE   Drug use: No   Sexual activity: Not Currently  Other Topics Concern   Not on file  Social History Narrative   Lives with wife and 1 son.   Social Drivers of Corporate investment banker Strain: Low Risk  (12/18/2023)   Overall Financial Resource  Strain (CARDIA)    Difficulty of Paying Living Expenses: Not hard at all  Food Insecurity: No Food Insecurity (12/18/2023)   Hunger Vital Sign    Worried About Running Out of Food in the Last Year: Never true    Ran Out of Food in the Last Year: Never true  Transportation Needs: No Transportation Needs (12/18/2023)   PRAPARE - Administrator, Civil Service (Medical): No    Lack of Transportation (Non-Medical): No  Physical Activity: Inactive (12/18/2023)   Exercise Vital Sign    Days of Exercise per Week: 0 days    Minutes of Exercise per Session: 20 min  Stress: No Stress Concern Present (12/18/2023)   [employer] of Occupational Health - Occupational Stress Questionnaire    Feeling of Stress : Not at all  Social Connections: Socially Isolated (12/18/2023)   Social Connection and Isolation Panel    Frequency of Communication with Friends and Family: Once a week    Frequency of Social Gatherings with Friends and Family: Once a week    Attends Religious Services: Never    Database administrator or Organizations: No    Attends Engineer, structural: Never    Marital Status: Married   Family History  Problem Relation Age of Onset   Heart failure Mother        died age 29   Heart failure Father        died age 65   Diabetes Other    Heart disease Neg Hx    Allergies  Allergen Reactions   Penicillins Other (See Comments) and Anaphylaxis    Taste buds   Current Outpatient Medications  Medication Sig Dispense Refill   Acetaminophen  (TYLENOL  8 HOUR PO) Take 1,000 mg by mouth 3 (three) times daily.     aspirin  EC 81 MG tablet Take 81 mg by mouth daily.     atorvastatin  (LIPITOR) 20 MG tablet TAKE 1 TABLET DAILY AT     6:00PM 90 tablet 1   carvedilol  (COREG ) 6.25 MG tablet Take 1 tablet (6.25 mg total) by mouth 2 (two) times daily with a meal. 180 tablet 3   cholecalciferol  (VITAMIN D ) 1000 UNITS tablet Take 1,000 Units by mouth daily.     DULoxetine  (CYMBALTA) 30 MG capsule Take 30 mg by mouth every morning.     HYDROcodone -acetaminophen  (NORCO/VICODIN) 5-325 MG tablet Take 1 tablet by mouth every 6 (six) hours as needed for moderate pain.     metFORMIN  (GLUCOPHAGE -XR) 500 MG 24 hr tablet TAKE 1 TABLET TWICE A DAY 180 tablet 3   Multiple Vitamin (MULTIVITAMIN WITH MINERALS) TABS tablet Take 1 tablet by mouth daily.     niacin  (NIASPAN ) 1000 MG CR tablet TAKE 1 TABLET AT BEDTIME  90 tablet 3   TURMERIC CURCUMIN PO Take 2,000 mg by mouth in the morning and at bedtime.     warfarin (JANTOVEN ) 2 MG tablet TAKE 1 TO 2 TABLETS DAILY AS DIRECTED BY COUMADIN  CLINIC 110 tablet 1   No current facility-administered medications for this visit.   No results found.  Review of Systems:   A ROS was performed including pertinent positives and negatives as documented in the HPI.  Physical Exam :   Constitutional: NAD and appears stated age Neurological: Alert and oriented Psych: Appropriate affect and cooperative There were no vitals taken for this visit.   Comprehensive Musculoskeletal Exam:    Right hip with positive Trendelenburg gait.  There is tenderness about the posterior lateral glute with some radiation.  Weakness with abduction of the right hip which is essentially antigravity.  30 degrees internal rotation of the hip and external rotation without pain, negative FADIR   Imaging:   Xray (3 views right hip): Normal  MRI (right hip): Extensive clear tearing of the gluteus medius minimus with atrophy of both   I personally reviewed and interpreted the radiographs.   Assessment and Plan:   89 y.o. male with gluteus medius tone and atrophy with essentially a nonfunctional abductor mechanism.  I did describe that I would recommend surgical intervention at this time or at least consider it given the fact that he is having persistent limp and is quite concerned about falling and gait.  Given the fact that he has had multiple injections and  then he does have evidence of atrophy in his gluteal abductor mechanism I have recommended consideration of gluteus maximus tendon transfer.  I did discuss risks and limitations associated with this as well as the associated recovery timeframe.  After discussion he would like to proceed  -Plan for right hip gluteus maximus tendon transfer   After a lengthy discussion of treatment options, including risks, benefits, alternatives, complications of surgical and nonsurgical conservative options, the patient elected surgical repair.   The patient  is aware of the material risks  and complications including, but not limited to injury to adjacent structures, neurovascular injury, infection, numbness, bleeding, implant failure, thermal burns, stiffness, persistent pain, failure to heal, disease transmission from allograft, need for further surgery, dislocation, anesthetic risks, blood clots, risks of death,and others. The probabilities of surgical success and failure discussed with patient given their particular co-morbidities.The time and nature of expected rehabilitation and recovery was discussed.The patient's questions were all answered preoperatively.  No barriers to understanding were noted. I explained the natural history of the disease process and Rx rationale.  I explained to the patient what I considered to be reasonable expectations given their personal situation.  The final treatment plan was arrived at through a shared patient decision making process model.    I personally saw and evaluated the patient, and participated in the management and treatment plan.  Wilhelmenia Harada, MD Attending Physician, Orthopedic Surgery  This document was dictated using Dragon voice recognition software. A reasonable attempt at proof reading has been made to minimize errors.

## 2024-04-04 ENCOUNTER — Telehealth: Payer: Self-pay | Admitting: Cardiovascular Disease

## 2024-04-04 NOTE — Telephone Encounter (Signed)
   Pre-operative Risk Assessment    Patient Name: Andrew [name]  DOB: Feb 14, 1934 MRN: [id number]   Date of last office visit: 07/09/23 Date of next office visit: n/a   Request for Surgical Clearance    Procedure:  Right Gluteus Maximus Tendon Transfer  Date of Surgery:  Clearance TBD                                Surgeon:  Wilhelmenia Harada, MD Surgeon's Group or Practice Name:  [facility] Phone number:  [phone] Fax number:  [phone]   Type of Clearance Requested:  Hold Aspirin  7days prior, hold Warfarin 5days prior    Type of Anesthesia:  General    Additional requests/questions:    Signed, Fletcher Humble   04/04/2024, 1:55 PM

## 2024-04-05 ENCOUNTER — Ambulatory Visit: Payer: Self-pay | Admitting: Internal Medicine

## 2024-04-11 NOTE — Telephone Encounter (Signed)
   Patient Name: Andrew [name]  DOB: August 27, 1934 MRN: [id number]  Primary Cardiologist: Belva Boyden, MD  Clinical pharmacists have reviewed the patient's past medical history, labs, and current medications as part of preoperative protocol coverage. 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     Procedure:  Right Gluteus Maximus Tendon Transfer   Date of Surgery:  Clearance TBD         CrCl 31 Platelet count 193   Per office protocol, patient can hold warfarin for 5 days prior to procedure.   Patient will not need bridging with Lovenox (enoxaparin) around procedure.  I will route this recommendation to the requesting party via Epic fax function and remove from pre-op  pool.  Please call with questions.  Friddie Jetty, NP 04/11/2024, 7:57 AM

## 2024-04-11 NOTE — Telephone Encounter (Signed)
   Patient Name: Andrew [name]  DOB: 1934/09/21 MRN: [id number]  Primary Cardiologist: Belva Boyden, MD  Chart reviewed as part of pre-operative protocol coverage. Given past medical history and time since last visit, based on ACC/AHA guidelines, Andrew [name] is at acceptable risk for the planned procedure without further cardiovascular testing.   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  Per office protocol, if patient is without any new symptoms or concerns at the time of their virtual visit, he/she may hold Asprin for 7 days prior to procedure. Please resume Aspirin  as soon as possible postprocedure, at the discretion of the surgeon.     Procedure:  Right Gluteus Maximus Tendon Transfer   Date of Surgery:  Clearance TBD         CrCl 31 Platelet count 193   Per office protocol, patient can hold warfarin for 5 days prior to procedure.   Patient will not need bridging with Lovenox (enoxaparin) around procedure.  The patient was advised that if he develops new symptoms prior to surgery to contact our office to arrange for a follow-up visit, and he verbalized understanding.  I will route this recommendation to the requesting party via Epic fax function and remove from pre-op  pool.  Please call with questions.  Friddie Jetty, NP 04/11/2024, 8:02 AM

## 2024-04-11 NOTE — Telephone Encounter (Signed)
 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    Procedure:  Right Gluteus Maximus Tendon Transfer   Date of Surgery:  Clearance TBD        CrCl 31 Platelet count 193  Per office protocol, patient can hold warfarin for 5 days prior to procedure.   Patient will not need bridging with Lovenox (enoxaparin) around procedure.  **This guidance is not considered finalized until pre-operative APP has relayed final recommendations.**

## 2024-04-16 ENCOUNTER — Ambulatory Visit: Attending: Internal Medicine

## 2024-04-16 DIAGNOSIS — Z5181 Encounter for therapeutic drug level monitoring: Secondary | ICD-10-CM

## 2024-04-16 DIAGNOSIS — I82409 Acute embolism and thrombosis of unspecified deep veins of unspecified lower extremity: Secondary | ICD-10-CM | POA: Diagnosis not present

## 2024-04-16 LAB — POCT INR: INR: 1.9 — AB (ref 2.0–3.0)

## 2024-04-16 NOTE — Patient Instructions (Signed)
 Take 1 tablet Daily, except 2 tablets every Monday and Friday.    INR in 8 weeks.  [phone]  Upcoming muscle surgery TBD

## 2024-04-30 ENCOUNTER — Other Ambulatory Visit: Payer: Self-pay

## 2024-04-30 ENCOUNTER — Encounter (HOSPITAL_BASED_OUTPATIENT_CLINIC_OR_DEPARTMENT_OTHER): Payer: Self-pay | Admitting: Orthopaedic Surgery

## 2024-04-30 NOTE — Progress Notes (Signed)
   04/30/24 1222  PAT Phone Screen  Is the patient taking a GLP-1 receptor agonist? No  Do You Have Diabetes? Yes  Do You Have Hypertension? Yes  Have You Ever Been to the ER for Asthma? No  Have You Taken Oral Steroids in the Past 3 Months? No  Do you Take Phenteramine or any Other Diet Drugs? No  Recent  Lab Work, EKG, CXR? Yes  Where was this test performed? 04-01-24 [facility] 6.7  Do you have a history of heart problems? (S)  Yes (CAD-CABGx4 2010, Pacemaker insertion for CHB 2018)  Cardiologist Name Dr Gollan  Have you ever had tests on your heart? Yes  What cardiac tests were performed? Echo  What date/year were cardiac tests completed? 11-08-21 ECHO EF 55-60%  Results viewable: [facility] Media Tab  Any Recent Hospitalizations? No  Height 5' 8 (1.727 m)  Weight 65.8 kg  Pat Appointment Scheduled (S)  Yes (BMP, PT/INR, EKG on 05-05-24)

## 2024-05-01 ENCOUNTER — Ambulatory Visit: Attending: Pulmonary Disease | Admitting: Pulmonary Disease

## 2024-05-01 ENCOUNTER — Encounter: Payer: Self-pay | Admitting: Internal Medicine

## 2024-05-01 ENCOUNTER — Encounter: Payer: Self-pay | Admitting: Pulmonary Disease

## 2024-05-01 VITALS — BP 112/58 | HR 60 | Ht 68.0 in | Wt 153.0 lb

## 2024-05-01 DIAGNOSIS — I442 Atrioventricular block, complete: Secondary | ICD-10-CM

## 2024-05-01 DIAGNOSIS — Z950 Presence of cardiac pacemaker: Secondary | ICD-10-CM

## 2024-05-01 LAB — CUP PACEART INCLINIC DEVICE CHECK
Battery Remaining Longevity: 49 mo
Battery Voltage: 2.95 V
Brady Statistic RA Percent Paced: 18 %
Brady Statistic RV Percent Paced: 99.91 %
Date Time Interrogation Session: 20250710170258
Implantable Lead Connection Status: 753985
Implantable Lead Connection Status: 753985
Implantable Lead Implant Date: 20180825
Implantable Lead Implant Date: 20180825
Implantable Lead Location: 753859
Implantable Lead Location: 753860
Implantable Pulse Generator Implant Date: 20180825
Lead Channel Impedance Value: 475 Ohm
Lead Channel Impedance Value: 650 Ohm
Lead Channel Pacing Threshold Amplitude: 0.5 V
Lead Channel Pacing Threshold Amplitude: 0.5 V
Lead Channel Pacing Threshold Amplitude: 0.5 V
Lead Channel Pacing Threshold Amplitude: 0.5 V
Lead Channel Pacing Threshold Pulse Width: 0.5 ms
Lead Channel Pacing Threshold Pulse Width: 0.5 ms
Lead Channel Pacing Threshold Pulse Width: 0.5 ms
Lead Channel Pacing Threshold Pulse Width: 0.5 ms
Lead Channel Sensing Intrinsic Amplitude: 5 mV
Lead Channel Setting Pacing Amplitude: 0.75 V
Lead Channel Setting Pacing Amplitude: 2 V
Lead Channel Setting Pacing Pulse Width: 0.5 ms
Lead Channel Setting Sensing Sensitivity: 4 mV
Pulse Gen Model: 2272
Pulse Gen Serial Number: 8939923

## 2024-05-01 NOTE — Progress Notes (Signed)
 PERIOPERATIVE PRESCRIPTION FOR IMPLANTED CARDIAC DEVICE PROGRAMMING  Patient Information: Name:  Andrew [name]  DOB:  Dec 11, 1933  MRN:  [id number]  Procedure:  Right Gluteus Maximus Tendon Transfer   Date of Surgery:  Clearance TBD                                  Surgeon:  Elspeth Parker, MD Surgeon's Group or Practice Name:  [facility] Phone number:  [phone] Fax number:  [phone]   Type of Clearance Requested:  Device    Type of Anesthesia:  General  Device Information:  Clinic EP Physician:  Danelle Birmingham, MD   Device Type:  Pacemaker Manufacturer and Phone #:  St. Jude/Abbott: [phone] Pacemaker Dependent?:  Yes.   Date of Last Device Check:  05/01/2024 Normal Device Function?:  Yes.    Electrophysiologist's Recommendations:  Have magnet available. Provide continuous ECG monitoring when magnet is used or reprogramming is to be performed.  Procedure should not interfere with device function.  No device programming or magnet placement needed.  Per Device Clinic Standing Orders, Delon DELENA Sharps, RN  4:47 PM 05/01/2024

## 2024-05-01 NOTE — Patient Instructions (Signed)
 Medication Instructions:  Your physician recommends that you continue on your current medications as directed. Please refer to the Current Medication list given to you today.  *If you need a refill on your cardiac medications before your next appointment, please call your pharmacy*  Lab Work: None ordered If you have labs (blood work) drawn today and your tests are completely normal, you will receive your results only by: MyChart Message (if you have MyChart) OR A paper copy in the mail If you have any lab test that is abnormal or we need to change your treatment, we will call you to review the results.  Follow-Up: At [facility], you and your health needs are our priority.  As part of our continuing mission to provide you with exceptional heart care, our providers are all part of one team.  This team includes your primary Cardiologist (physician) and Advanced Practice Providers or APPs (Physician Assistants and Nurse Practitioners) who all work together to provide you with the care you need, when you need it.  Your next appointment:   1 year(s)  Provider:   You will see one of the following Advanced Practice Providers on your designated Care Team:   Mertha Abrahams, Kennard Pea [address] [place], PA-C Suzann Riddle, NP Creighton Doffing, NP

## 2024-05-01 NOTE — Addendum Note (Signed)
 Addended by: VICCI SELLER A on: 05/01/2024 03:08 PM   Modules accepted: Orders

## 2024-05-01 NOTE — Progress Notes (Signed)
 Remote pacemaker transmission.

## 2024-05-01 NOTE — Progress Notes (Signed)
 Electrophysiology Office Note:   Date:  05/01/2024  ID:  Andrew [name], DOB 27-Sep-1934, MRN [id number]  Primary Cardiologist: Evalene Lunger, MD Primary Heart Failure: None Electrophysiologist: Danelle Birmingham, MD       History of Present Illness:   Andrew [name] is a 89 y.o. male with h/o CHB s/p PPM, HLD, CAD s/p CABG, prior DVT LLE, pulmonary fibrosis, DM II, CKD III seen today for routine electrophysiology followup & pre-operative clearance.   Since last being seen in our clinic the patient reports he is anxious to have surgery to help relieve his hip pain.  Patient reports he has been dealing with his wife's illness recently as she is on Coumadin  and fell and hit her head and fortunately had some type of ICH.  He is already scheduled for surgery on July 15th per Dr. Genelle.  Patient denies acute device concerns.  Patient reports he is independent of all ADLs.  Does not have significant shortness of breath with exertion.  No chest pain.  He denies chest pain, palpitations, dyspnea, PND, orthopnea, nausea, vomiting, dizziness, syncope, edema, weight gain, or early satiety.   Review of systems complete and found to be negative unless listed in HPI.   EP Information / Studies Reviewed:    EKG is ordered today. Personal review as below.  EKG Interpretation Date/Time:  Thursday May 01 2024 16:03:42 EDT Ventricular Rate:  60 PR Interval:  210 QRS Duration:  160 QT Interval:  446 QTC Calculation: 446 R Axis:   260  Text Interpretation: AV dual-paced rhythm with prolonged AV conduction Confirmed by Aniceto Jarvis ([id number]) on 05/01/2024 4:54:34 PM   PPM Interrogation-  reviewed in detail today,  See PACEART report.  Device History: Abbott Dual Chamber PPM implanted 06/16/17 for CHB  Risk Assessment/Calculations:              Physical Exam:   VS:  BP (!) 112/58 (BP Location: Left Arm)   Pulse 60   Ht 5' 8 (1.727 m)   Wt 153 lb (69.4 kg)   SpO2 97%   BMI 23.26  kg/m    Wt Readings from Last 3 Encounters:  05/01/24 153 lb (69.4 kg)  04/01/24 153 lb (69.4 kg)  12/21/23 161 lb 12.8 oz (73.4 kg)     GEN: Well nourished, well developed in no acute distress NECK: No JVD; No carotid bruits CARDIAC: Regular rate and rhythm, no murmurs, rubs, gallops RESPIRATORY:  Clear to auscultation without rales, wheezing or rhonchi  ABDOMEN: Soft, non-tender, non-distended EXTREMITIES:  No edema; No deformity   ASSESSMENT AND PLAN:    CHB s/p Abbott PPM  -Normal PPM function -See Pace Art report -No changes today  CAD s/p CABG  -no anginal symptoms   Pulmonary Fibrosis  -per Pulmonary   Pre-Operative Clearance     [name]'s perioperative risk of a major cardiac event is 0.9% according to the Revised Cardiac Risk Index (RCRI).  Therefore, he is technically low risk by risk score for perioperative complications.  However, given his age & comorbidities I suspect he more realistically falls as moderate risk.  Would defer final decision about moving forward with surgery to Dr. Genelle and patient. He has baseline pulmonary fibrosis and is previously followed by Dr. Kara. Given risks of anesthesia with pulmonary fibrosis it is not unreasonable to have pulmonary weigh in on his risk as well.  His most recent  ECHO shows normal LVEF.  His device check in clinic on 05/01/2024 shows  normal device function, patient is dependent at 40 beats a minute with no escape.  See device clinic recommendations for intraoperative pacemaker care.   Recommendations: According to ACC/AHA guidelines, no further cardiovascular testing needed.  The patient may proceed to surgery at acceptable risk.    Antiplatelet and/or Anticoagulation Recommendations:  Coumadin  can by held for 5 days prior to surgery.  Please resume post op when felt to be safe. Per office protocol, patient can hold warfarin for 5 days prior to procedure.   Patient will not need bridging with Lovenox  (enoxaparin) around procedure.    Request for Surgical Clearance    Procedure:  Right Gluteus Maximus Tendon Transfer Date of Surgery:  Clearance TBD                              Surgeon:  Elspeth Parker, MD Surgeon's Group or Practice Name:  [facility] Phone number:  [phone] Fax number:  [phone] Type of Clearance Requested:  Hold Aspirin  7days prior, hold Warfarin 5 days prior Type of Anesthesia:  General    Disposition:   Follow up with Dr. Waddell in 12 months  Signed, Daphne Barrack, NP-C, AGACNP-BC [facility] - Electrophysiology  05/01/2024, 4:57 PM

## 2024-05-05 ENCOUNTER — Encounter (HOSPITAL_BASED_OUTPATIENT_CLINIC_OR_DEPARTMENT_OTHER)
Admission: RE | Admit: 2024-05-05 | Discharge: 2024-05-05 | Disposition: A | Discharge status: Home / Self Care | Source: Ambulatory Visit | Attending: Orthopaedic Surgery | Admitting: Orthopaedic Surgery

## 2024-05-05 DIAGNOSIS — Z7901 Long term (current) use of anticoagulants: Secondary | ICD-10-CM | POA: Diagnosis not present

## 2024-05-05 DIAGNOSIS — Z01818 Encounter for other preprocedural examination: Secondary | ICD-10-CM | POA: Insufficient documentation

## 2024-05-05 DIAGNOSIS — E119 Type 2 diabetes mellitus without complications: Secondary | ICD-10-CM | POA: Diagnosis not present

## 2024-05-05 DIAGNOSIS — X58XXXA Exposure to other specified factors, initial encounter: Secondary | ICD-10-CM | POA: Diagnosis not present

## 2024-05-05 DIAGNOSIS — Z951 Presence of aortocoronary bypass graft: Secondary | ICD-10-CM | POA: Diagnosis not present

## 2024-05-05 DIAGNOSIS — M67951 Unspecified disorder of synovium and tendon, right thigh: Secondary | ICD-10-CM | POA: Diagnosis present

## 2024-05-05 DIAGNOSIS — E1149 Type 2 diabetes mellitus with other diabetic neurological complication: Secondary | ICD-10-CM | POA: Insufficient documentation

## 2024-05-05 DIAGNOSIS — S76011A Strain of muscle, fascia and tendon of right hip, initial encounter: Secondary | ICD-10-CM | POA: Diagnosis not present

## 2024-05-05 DIAGNOSIS — I2510 Atherosclerotic heart disease of native coronary artery without angina pectoris: Secondary | ICD-10-CM | POA: Diagnosis not present

## 2024-05-05 DIAGNOSIS — Z7984 Long term (current) use of oral hypoglycemic drugs: Secondary | ICD-10-CM | POA: Diagnosis not present

## 2024-05-05 DIAGNOSIS — Z950 Presence of cardiac pacemaker: Secondary | ICD-10-CM | POA: Diagnosis not present

## 2024-05-05 DIAGNOSIS — I10 Essential (primary) hypertension: Secondary | ICD-10-CM | POA: Diagnosis not present

## 2024-05-05 LAB — BASIC METABOLIC PANEL WITH GFR
Anion gap: 11 (ref 5–15)
BUN: 28 mg/dL — ABNORMAL HIGH (ref 8–23)
CO2: 25 mmol/L (ref 22–32)
Calcium: 9.2 mg/dL (ref 8.9–10.3)
Chloride: 104 mmol/L (ref 98–111)
Creatinine, Ser: 1.43 mg/dL — ABNORMAL HIGH (ref 0.61–1.24)
GFR, Estimated: 47 mL/min — ABNORMAL LOW (ref >60)
Glucose, Bld: 182 mg/dL — ABNORMAL HIGH (ref 70–99)
Potassium: 4.1 mmol/L (ref 3.5–5.1)
Sodium: 140 mmol/L (ref 135–145)

## 2024-05-05 LAB — PROTIME-INR
INR: 1.1 (ref 0.8–1.2)
Prothrombin Time: 15.1 s (ref 11.4–15.2)

## 2024-05-05 NOTE — Progress Notes (Signed)

## 2024-05-05 NOTE — Anesthesia Preprocedure Evaluation (Addendum)
 Anesthesia Evaluation  Patient identified by MRN, date of birth, ID band Patient awake    Reviewed: Allergy & Precautions, NPO status , Patient's Chart, lab work & pertinent test results  Airway Mallampati: II  TM Distance: >3 FB Neck ROM: Full    Dental no notable dental hx. (+) Dental Advisory Given, Partial Upper   Pulmonary neg pulmonary ROS   Pulmonary exam normal breath sounds clear to auscultation       Cardiovascular hypertension, Pt. on home beta blockers + CAD and + CABG  DOE: heart block.Normal cardiovascular exam(-) dysrhythmias + pacemaker  Rhythm:Regular Rate:Normal  Ef 60%   Neuro/Psych negative neurological ROS     GI/Hepatic   Endo/Other  diabetes, Type 2, Oral Hypoglycemic Agents    Renal/GU Renal InsufficiencyRenal disease     Musculoskeletal  (+) Arthritis ,    Abdominal   Peds  Hematology negative hematology ROS (+)   Anesthesia Other Findings Day of surgery medications reviewed with the patient.  Reproductive/Obstetrics                              Anesthesia Physical Anesthesia Plan  ASA: 3  Anesthesia Plan: General   Post-op Pain Management: Precedex  and Ofirmev  IV (intra-op)*   Induction: Intravenous  PONV Risk Score and Plan: 3 and Dexamethasone  and Ondansetron   Airway Management Planned: Oral ETT  Additional Equipment: None  Intra-op Plan:   Post-operative Plan: Extubation in OR  Informed Consent: I have reviewed the patients History and Physical, chart, labs and discussed the procedure including the risks, benefits and alternatives for the proposed anesthesia with the patient or authorized representative who has indicated his/her understanding and acceptance.     Dental advisory given  Plan Discussed with: CRNA  Anesthesia Plan Comments:          Anesthesia Quick Evaluation

## 2024-05-06 ENCOUNTER — Encounter (HOSPITAL_BASED_OUTPATIENT_CLINIC_OR_DEPARTMENT_OTHER): Payer: Self-pay | Admitting: Orthopaedic Surgery

## 2024-05-06 ENCOUNTER — Other Ambulatory Visit: Payer: Self-pay

## 2024-05-06 ENCOUNTER — Ambulatory Visit (HOSPITAL_BASED_OUTPATIENT_CLINIC_OR_DEPARTMENT_OTHER): Admitting: Anesthesiology

## 2024-05-06 ENCOUNTER — Ambulatory Visit (HOSPITAL_BASED_OUTPATIENT_CLINIC_OR_DEPARTMENT_OTHER)
Admission: RE | Admit: 2024-05-06 | Discharge: 2024-05-06 | Disposition: A | Discharge status: Home / Self Care | Attending: Orthopaedic Surgery | Admitting: Orthopaedic Surgery

## 2024-05-06 ENCOUNTER — Encounter (HOSPITAL_BASED_OUTPATIENT_CLINIC_OR_DEPARTMENT_OTHER)
Admission: RE | Disposition: A | Discharge status: Home / Self Care | Payer: Self-pay | Source: Home / Self Care | Attending: Orthopaedic Surgery

## 2024-05-06 DIAGNOSIS — X58XXXA Exposure to other specified factors, initial encounter: Secondary | ICD-10-CM | POA: Insufficient documentation

## 2024-05-06 DIAGNOSIS — N183 Chronic kidney disease, stage 3 unspecified: Secondary | ICD-10-CM | POA: Diagnosis not present

## 2024-05-06 DIAGNOSIS — Z7901 Long term (current) use of anticoagulants: Secondary | ICD-10-CM | POA: Insufficient documentation

## 2024-05-06 DIAGNOSIS — Z7984 Long term (current) use of oral hypoglycemic drugs: Secondary | ICD-10-CM | POA: Insufficient documentation

## 2024-05-06 DIAGNOSIS — I129 Hypertensive chronic kidney disease with stage 1 through stage 4 chronic kidney disease, or unspecified chronic kidney disease: Secondary | ICD-10-CM

## 2024-05-06 DIAGNOSIS — S76311A Strain of muscle, fascia and tendon of the posterior muscle group at thigh level, right thigh, initial encounter: Secondary | ICD-10-CM | POA: Diagnosis not present

## 2024-05-06 DIAGNOSIS — Z950 Presence of cardiac pacemaker: Secondary | ICD-10-CM | POA: Insufficient documentation

## 2024-05-06 DIAGNOSIS — I2510 Atherosclerotic heart disease of native coronary artery without angina pectoris: Secondary | ICD-10-CM | POA: Insufficient documentation

## 2024-05-06 DIAGNOSIS — S76011A Strain of muscle, fascia and tendon of right hip, initial encounter: Secondary | ICD-10-CM | POA: Diagnosis not present

## 2024-05-06 DIAGNOSIS — I10 Essential (primary) hypertension: Secondary | ICD-10-CM | POA: Insufficient documentation

## 2024-05-06 DIAGNOSIS — E119 Type 2 diabetes mellitus without complications: Secondary | ICD-10-CM | POA: Insufficient documentation

## 2024-05-06 DIAGNOSIS — Z951 Presence of aortocoronary bypass graft: Secondary | ICD-10-CM | POA: Insufficient documentation

## 2024-05-06 DIAGNOSIS — E1149 Type 2 diabetes mellitus with other diabetic neurological complication: Secondary | ICD-10-CM

## 2024-05-06 HISTORY — DX: Gastro-esophageal reflux disease without esophagitis: K21.9

## 2024-05-06 HISTORY — PX: GLUTEUS MINIMUS REPAIR: SHX5843

## 2024-05-06 HISTORY — DX: Chronic kidney disease, unspecified: N18.9

## 2024-05-06 LAB — GLUCOSE, CAPILLARY
Glucose-Capillary: 104 mg/dL — ABNORMAL HIGH (ref 70–99)
Glucose-Capillary: 105 mg/dL — ABNORMAL HIGH (ref 70–99)

## 2024-05-06 SURGERY — REPAIR, TENDON, GLUTEUS MINIMUS
Anesthesia: General | Site: Buttocks | Laterality: Right

## 2024-05-06 MED ORDER — CEFAZOLIN SODIUM-DEXTROSE 2-4 GM/100ML-% IV SOLN
INTRAVENOUS | Status: AC
Start: 2024-05-06 — End: 2024-05-06
  Filled 2024-05-06: qty 100

## 2024-05-06 MED ORDER — ATROPINE SULFATE 0.4 MG/ML IV SOLN
INTRAVENOUS | Status: AC
  Filled 2024-05-06: qty 1

## 2024-05-06 MED ORDER — TRANEXAMIC ACID-NACL 1000-0.7 MG/100ML-% IV SOLN
INTRAVENOUS | Status: AC
  Filled 2024-05-06: qty 100

## 2024-05-06 MED ORDER — ONDANSETRON HCL 4 MG/2ML IJ SOLN: 4.0000 mg | Freq: Once | INTRAMUSCULAR | Status: DC | PRN

## 2024-05-06 MED ORDER — FENTANYL CITRATE (PF) 100 MCG/2ML IJ SOLN: 25.0000 ug | INTRAMUSCULAR | Status: DC | PRN

## 2024-05-06 MED ORDER — PHENYLEPHRINE 80 MCG/ML (10ML) SYRINGE FOR IV PUSH (FOR BLOOD PRESSURE SUPPORT)
PREFILLED_SYRINGE | INTRAVENOUS | Status: AC
  Filled 2024-05-06: qty 30

## 2024-05-06 MED ORDER — ONDANSETRON HCL 4 MG/2ML IJ SOLN
INTRAMUSCULAR | Status: AC
  Filled 2024-05-06: qty 2

## 2024-05-06 MED ORDER — ACETAMINOPHEN 500 MG PO TABS
1000.0000 mg | ORAL_TABLET | Freq: Once | ORAL | Status: AC
  Administered 2024-05-06: 1000 mg via ORAL

## 2024-05-06 MED ORDER — GABAPENTIN 300 MG PO CAPS: 300.0000 mg | ORAL_CAPSULE | Freq: Once | ORAL | Status: DC

## 2024-05-06 MED ORDER — ACETAMINOPHEN 10 MG/ML IV SOLN: 1000.0000 mg | Freq: Once | INTRAVENOUS | Status: DC | PRN

## 2024-05-06 MED ORDER — DEXAMETHASONE SODIUM PHOSPHATE 10 MG/ML IJ SOLN
INTRAMUSCULAR | Status: AC
  Filled 2024-05-06: qty 2

## 2024-05-06 MED ORDER — SUGAMMADEX SODIUM 200 MG/2ML IV SOLN
INTRAVENOUS | Status: AC
  Filled 2024-05-06: qty 2

## 2024-05-06 MED ORDER — ASPIRIN 325 MG PO TBEC: 325.0000 mg | DELAYED_RELEASE_TABLET | Freq: Every day | ORAL | 0 refills | Status: AC

## 2024-05-06 MED ORDER — PHENYLEPHRINE HCL (PRESSORS) 10 MG/ML IV SOLN
INTRAVENOUS | Status: DC | PRN
  Administered 2024-05-06 (×3): 80 ug via INTRAVENOUS

## 2024-05-06 MED ORDER — ONDANSETRON HCL 4 MG/2ML IJ SOLN
INTRAMUSCULAR | Status: DC | PRN
  Administered 2024-05-06: 4 mg via INTRAVENOUS

## 2024-05-06 MED ORDER — ROCURONIUM BROMIDE 100 MG/10ML IV SOLN
INTRAVENOUS | Status: DC | PRN
  Administered 2024-05-06: 50 mg via INTRAVENOUS

## 2024-05-06 MED ORDER — FENTANYL CITRATE (PF) 100 MCG/2ML IJ SOLN
INTRAMUSCULAR | Status: DC | PRN
  Administered 2024-05-06: 50 ug via INTRAVENOUS

## 2024-05-06 MED ORDER — FENTANYL CITRATE (PF) 100 MCG/2ML IJ SOLN
INTRAMUSCULAR | Status: AC
  Filled 2024-05-06: qty 2

## 2024-05-06 MED ORDER — LIDOCAINE 2% (20 MG/ML) 5 ML SYRINGE
INTRAMUSCULAR | Status: AC
  Filled 2024-05-06: qty 15

## 2024-05-06 MED ORDER — EPHEDRINE SULFATE (PRESSORS) 50 MG/ML IJ SOLN
INTRAMUSCULAR | Status: DC | PRN
  Administered 2024-05-06 (×5): 5 mg via INTRAVENOUS

## 2024-05-06 MED ORDER — SUGAMMADEX SODIUM 200 MG/2ML IV SOLN
INTRAVENOUS | Status: DC | PRN
  Administered 2024-05-06: 200 mg via INTRAVENOUS

## 2024-05-06 MED ORDER — PROPOFOL 10 MG/ML IV BOLUS
INTRAVENOUS | Status: DC | PRN
Start: 2024-05-06 — End: 2024-05-06
  Administered 2024-05-06: 80 mg via INTRAVENOUS

## 2024-05-06 MED ORDER — ROCURONIUM BROMIDE 10 MG/ML (PF) SYRINGE
PREFILLED_SYRINGE | INTRAVENOUS | Status: AC
Start: 2024-05-06 — End: 2024-05-06
  Filled 2024-05-06: qty 30

## 2024-05-06 MED ORDER — LIDOCAINE HCL (CARDIAC) PF 100 MG/5ML IV SOSY
PREFILLED_SYRINGE | INTRAVENOUS | Status: DC | PRN
  Administered 2024-05-06: 80 mg via INTRAVENOUS

## 2024-05-06 MED ORDER — ACETAMINOPHEN 500 MG PO TABS
ORAL_TABLET | ORAL | Status: AC
  Filled 2024-05-06: qty 2

## 2024-05-06 MED ORDER — LACTATED RINGERS IV SOLN: INTRAVENOUS | Status: DC

## 2024-05-06 MED ORDER — CEFAZOLIN SODIUM-DEXTROSE 2-4 GM/100ML-% IV SOLN
2.0000 g | INTRAVENOUS | Status: AC
  Administered 2024-05-06: 2 g via INTRAVENOUS

## 2024-05-06 MED ORDER — EPHEDRINE 5 MG/ML INJ
INTRAVENOUS | Status: AC
Start: 2024-05-06 — End: 2024-05-06
  Filled 2024-05-06: qty 10

## 2024-05-06 MED ORDER — TRANEXAMIC ACID-NACL 1000-0.7 MG/100ML-% IV SOLN: 1000.0000 mg | INTRAVENOUS | Status: DC

## 2024-05-06 MED ORDER — DEXMEDETOMIDINE HCL IN NACL 80 MCG/20ML IV SOLN
INTRAVENOUS | Status: DC | PRN
  Administered 2024-05-06: 2 ug via INTRAVENOUS
  Administered 2024-05-06: 4 ug via INTRAVENOUS

## 2024-05-06 MED ORDER — OXYCODONE HCL 5 MG PO TABS: 5.0000 mg | ORAL_TABLET | ORAL | 0 refills | Status: DC | PRN

## 2024-05-06 MED ORDER — 0.9 % SODIUM CHLORIDE (POUR BTL) OPTIME
TOPICAL | Status: DC | PRN
  Administered 2024-05-06: 300 mL

## 2024-05-06 MED ORDER — ACETAMINOPHEN 500 MG PO TABS: 500.0000 mg | ORAL_TABLET | Freq: Three times a day (TID) | ORAL | 0 refills | Status: AC

## 2024-05-06 MED ORDER — GABAPENTIN 300 MG PO CAPS
ORAL_CAPSULE | ORAL | Status: AC
  Filled 2024-05-06: qty 1

## 2024-05-06 MED ORDER — BUPIVACAINE HCL 0.25 % IJ SOLN
INTRAMUSCULAR | Status: DC | PRN
  Administered 2024-05-06: 20 mL

## 2024-05-06 SURGICAL SUPPLY — 47 items
ANCHOR JUGGERKNOT SOFT 2.9 (Anchor) IMPLANT
ANCHOR SUT QUATTRO KNTLS 4.5 (Anchor) IMPLANT
BLADE SURG 10 STRL SS (BLADE) ×1 IMPLANT
BLADE SURG 15 STRL LF DISP TIS (BLADE) ×1 IMPLANT
CANISTER SUCT 1200ML W/VALVE (MISCELLANEOUS) ×1 IMPLANT
CHLORAPREP W/TINT 26 (MISCELLANEOUS) ×1 IMPLANT
CLSR STERI-STRIP ANTIMIC 1/2X4 (GAUZE/BANDAGES/DRESSINGS) IMPLANT
COOLER ICEMAN CLASSIC (MISCELLANEOUS) ×1 IMPLANT
COVER BACK TABLE 60X90IN (DRAPES) ×1 IMPLANT
COVER MAYO STAND STRL (DRAPES) ×1 IMPLANT
DERMABOND ADVANCED .7 DNX12 (GAUZE/BANDAGES/DRESSINGS) IMPLANT
DRAPE STERI IOBAN 125X83 (DRAPES) ×1 IMPLANT
DRAPE U-SHAPE 47X51 STRL (DRAPES) ×2 IMPLANT
DRSG AQUACEL AG ADV 3.5X 6 (GAUZE/BANDAGES/DRESSINGS) ×1 IMPLANT
DRSG AQUACEL AG ADV 3.5X10 (GAUZE/BANDAGES/DRESSINGS) IMPLANT
ELECTRODE BLDE 4.0 EZ CLN MEGD (MISCELLANEOUS) IMPLANT
ELECTRODE REM PT RTRN 9FT ADLT (ELECTROSURGICAL) ×1 IMPLANT
GAUZE PAD ABD 8X10 STRL (GAUZE/BANDAGES/DRESSINGS) IMPLANT
GAUZE XEROFORM 1X8 LF (GAUZE/BANDAGES/DRESSINGS) IMPLANT
GLOVE BIO SURGEON STRL SZ 6 (GLOVE) ×2 IMPLANT
GLOVE BIO SURGEON STRL SZ7.5 (GLOVE) ×2 IMPLANT
GLOVE BIOGEL PI IND STRL 6.5 (GLOVE) ×1 IMPLANT
GLOVE BIOGEL PI IND STRL 8 (GLOVE) ×1 IMPLANT
GOWN STRL REUS W/ TWL LRG LVL3 (GOWN DISPOSABLE) ×2 IMPLANT
GOWN STRL REUS W/TWL XL LVL3 (GOWN DISPOSABLE) ×1 IMPLANT
IMPL TAPESTRY BIOINTEGR 40X30 (Mesh General) IMPLANT
MANIFOLD NEPTUNE II (INSTRUMENTS) ×1 IMPLANT
NDL HYPO 22X1.5 SAFETY MO (MISCELLANEOUS) ×1 IMPLANT
NEEDLE HYPO 22X1.5 SAFETY MO (MISCELLANEOUS) ×1 IMPLANT
NS IRRIG 1000ML POUR BTL (IV SOLUTION) ×1 IMPLANT
PACK BASIN DAY SURGERY FS (CUSTOM PROCEDURE TRAY) ×1 IMPLANT
PAD COLD SHLDR WRAP-ON (PAD) ×1 IMPLANT
PENCIL SMOKE EVACUATOR (MISCELLANEOUS) ×1 IMPLANT
SLEEVE SCD COMPRESS KNEE MED (STOCKING) ×1 IMPLANT
SPIKE FLUID TRANSFER (MISCELLANEOUS) IMPLANT
SPONGE T-LAP 18X18 ~~LOC~~+RFID (SPONGE) ×1 IMPLANT
SUCTION TUBE FRAZIER 10FR DISP (SUCTIONS) ×1 IMPLANT
SUT ETHILON 3 0 PS 1 (SUTURE) IMPLANT
SUT MNCRL AB 3-0 PS2 27 (SUTURE) IMPLANT
SUT VIC AB 0 CT1 27XBRD ANBCTR (SUTURE) ×1 IMPLANT
SUT VIC AB 2-0 CT1 TAPERPNT 27 (SUTURE) ×1 IMPLANT
SYR 20ML LL LF (SYRINGE) ×1 IMPLANT
SYR BULB EAR ULCER 3OZ GRN STR (SYRINGE) ×1 IMPLANT
TOWEL GREEN STERILE FF (TOWEL DISPOSABLE) ×2 IMPLANT
TUBE CONNECTING 20X1/4 (TUBING) ×1 IMPLANT
UNDERPAD 30X36 HEAVY ABSORB (UNDERPADS AND DIAPERS) IMPLANT
YANKAUER SUCT BULB TIP NO VENT (SUCTIONS) ×1 IMPLANT

## 2024-05-06 NOTE — Anesthesia Postprocedure Evaluation (Signed)
 Anesthesia Post Note  Patient: Gemini Beaumier  Procedure(s) Performed: REPAIR, TENDON, GLUTEUS MINIMUS (Right: Buttocks)     Patient location during evaluation: PACU Anesthesia Type: General Level of consciousness: awake and alert Pain management: pain level controlled Vital Signs Assessment: post-procedure vital signs reviewed and stable Respiratory status: spontaneous breathing, nonlabored ventilation, respiratory function stable and patient connected to nasal cannula oxygen Cardiovascular status: blood pressure returned to baseline and stable Postop Assessment: no apparent nausea or vomiting Anesthetic complications: no   No notable events documented.  Last Vitals:  Vitals:   05/06/24 1300 05/06/24 1315  BP: (!) 146/58 (!) 151/73  Pulse: 60 60  Resp: 11 12  Temp:  36.4 C  SpO2: 98% 97%    Last Pain:  Vitals:   05/06/24 1315  TempSrc:   PainSc: 0-No pain                 Garnette DELENA Gab

## 2024-05-06 NOTE — Anesthesia Procedure Notes (Signed)
 Procedure Name: Intubation Date/Time: 05/06/2024 11:40 AM  Performed by: Donnell Berwyn SQUIBB, CRNAPre-anesthesia Checklist: Patient identified, Emergency Drugs available, Suction available, Patient being monitored and Timeout performed Patient Re-evaluated:Patient Re-evaluated prior to induction Oxygen Delivery Method: Circle system utilized Preoxygenation: Pre-oxygenation with 100% oxygen Induction Type: IV induction Ventilation: Mask ventilation without difficulty Laryngoscope Size: Mac and 3 Grade View: Grade I Tube type: Oral Tube size: 7.0 mm Number of attempts: 1 Airway Equipment and Method: Stylet Placement Confirmation: ETT inserted through vocal cords under direct vision, positive ETCO2 and breath sounds checked- equal and bilateral Secured at: 22 cm Tube secured with: Tape Dental Injury: Teeth and Oropharynx as per pre-operative assessment

## 2024-05-06 NOTE — Op Note (Signed)
 Date of Surgery: 05/06/2024  INDICATIONS: Mr. Chohan is a 89 y.o.-year-old male with chronic right hip gluteus medius tear.  The risk and benefits of the procedure were discussed in detail and documented in the pre-operative evaluation.   PREOPERATIVE DIAGNOSIS: 1.  Chronic right hip gluteus medius tear  POSTOPERATIVE DIAGNOSIS: Same.  PROCEDURE: 1.  Right hip gluteus maximus tendon transfer 2.  Right hip open trochanteric bursectomy  SURGEON: Elspeth LITTIE Parker MD  ASSISTANT: Conley Dawson, ATC  ANESTHESIA:  general  IV FLUIDS AND URINE: See anesthesia record.  ANTIBIOTICS: Ancef   ESTIMATED BLOOD LOSS: 10 mL.  IMPLANTS:  Implant Name Type Inv. Item Serial No. Manufacturer Lot No. LRB No. Used Action  ANCHOR JUGGERKNOT SOFT 2.9 - [id number] Anchor ANCHOR JUGGERKNOT SOFT 2.9  ZIMMER RECON(ORTH,TRAU,BIO,SG) [id number] Right 1 Implanted  ANCHOR JUGGERKNOT SOFT 2.9 - [id number] Anchor ANCHOR JUGGERKNOT SOFT 2.9  ZIMMER RECON(ORTH,TRAU,BIO,SG) [id number] Right 1 Implanted  ANCHOR SUT QUATTRO KNTLS 4.5 - [id number] Anchor ANCHOR SUT QUATTRO KNTLS 4.5  ZIMMER RECON(ORTH,TRAU,BIO,SG) [id number] Right 1 Implanted  ANCHOR SUT QUATTRO KNTLS 4.5 - [id number] Anchor ANCHOR SUT QUATTRO KNTLS 4.5  ZIMMER RECON(ORTH,TRAU,BIO,SG) [id number] Right 1 Implanted  IMPL TAPESTRY BIOINTEGR 40X30 - [id number] Mesh General IMPL TAPESTRY BIOINTEGR 40X30  ZIMMER RECON(ORTH,TRAU,BIO,SG) [id number] Right 1 Implanted    DRAINS: None  CULTURES: None  COMPLICATIONS: none  DESCRIPTION OF PROCEDURE:  The patient was identified in the preoperative holding area.  Antibiotics were given 1 hour prior to skin incision.  Timeout was performed and surgical site was marked with nursing.  The patient was subsequently taken back to the operating room.  Anesthesia was induced. The patient was placed in the lateral decubitus position.  Axillary roll and down peroneal nerve was padded well   At this time I began with an approach to  the lateral trochanter centered at the vastus ridge.  15 blade was used to incise skin.  This was done down to the level of the IT band.  Electrocautery was utilized to achieve hemostasis.  At this time the IT band was marked in such a way as to preserve an anterior flap.  This was incised completely with a of an abduction.  Gluteus medius was completely torn and found to be nonviable with significant fatty infiltration.  At this time a flap was mobilized anteriorly from the gluteus maximus in a triangular fashion.  This mobilized well over to the trochanter. The trochanteric bursectomy was seen and excises with Metzenbaums.   A double row construct was utilized with 2 medial row all suture anchors.  These were brought up through the remaining gluteus medius tendon and into the flap.  2 of these were tied to provisionally stabilize the flap.  These were then brought up to the distal most aspect of the flap and brought into 2 lateral row anchors over the native footprint of the gluteus medius.  Given the quality of the tendon the decision was made to supplement with a collagen patch.  This was sutured in the corners into the native insertion of the gluteus medius.   This time the wound was thoroughly irrigated.  The posterior aspect of the IT band was sutured to the posterior limb of the flap.  This was done with 0 Vicryl.  This was done in an interrupted fashion.  The wound was again irrigated and closed in layers of 0 Vicryl, 2-0 Vicryl and 3-0 nylon.  Xeroform, gauze, Aquacel dressing were applied.   Instrument, sponge, and needle  counts were correct prior to wound closure and at the conclusion of the case.  The patient was taken to the PACU without complication         POSTOPERATIVE PLAN: The patient will be weightbearing as tolerated.  He will be placed on Aspirin  for blood clot prevention.  I will see the patient back in 2 weeks.     Elspeth LITTIE Parker, MD 12:29 PM

## 2024-05-06 NOTE — H&P (Signed)
 Expand All Collapse All       Chief Complaint: Right hip pain        History of Present Illness:      Andrew [name] is a 89 y.o. male presents today with ongoing right hip pain now for the better part of this year.  He states that he 1 day woke up and did feel significant pain and weakness in the right hip with difficulty utilizing this.  He has been seen by Dr. Abazebo who has been treating him for his right hip.  He has had multiple epidural injections without any relief.  The pain is centered about the posterior buttocks with some radiation laterally.  He does have a significant limp with his gait.  He has having a hard time laying on the side.  He currently lives with his wife who is status post stroke who he is needing to be very active in order to assist.  He has had multiple injections into the lateral hip.  With some temporary relief.       PMH/PSH/Family History/Social History/Meds/Allergies:         Past Medical History:  Diagnosis Date   Colonic polyp 2011 last colo    colo q 32yr - [facility]    Coronary artery disease 02/2009    a. 02/2009 Cath: severe left main and three-vessel-->CABG; b. 12/2016 MVL EF 49%, no isch/infarct.   DEGENERATIVE JOINT DISEASE     Diastolic dysfunction      a. 10/2021 Echo: EF 55-60%, no rwma, mod conc LVH, GrI DD, mildly reduced RV fxn, RVSP 26.8 mmHg. mild AI, and aortic sclerosis.   Diverticulosis of colon     DIVERTICULOSIS, COLON     History of kidney stones     HYPERLIPIDEMIA-MIXED     HYPERTENSION, BENIGN     Interstitial lung disease (HCC)     LBP (low back pain)      s/p decompression 8/14; ESI - [place]; RFA x 3 early 2015   Pacemaker      a. 05/2017: St. Jude (serial number [id number]) pacemaker   Symptomatic bradycardia      a. s/p St. Jude (serial number [id number]) pacemaker 06/16/17   Type II or unspecified type diabetes mellitus without mention of complication, not stated as uncontrolled     VITAMIN D  DEFICIENCY                Past Surgical History:  Procedure Laterality Date   CARPAL TUNNEL RELEASE Right     CATARACT EXTRACTION, BILATERAL        R 07/07/13, L 07/21/13   CORONARY ARTERY BYPASS GRAFT   03/17/09    x 4   CYSTOSCOPY WITH STENT PLACEMENT Left 05/10/2021    Procedure: CYSTOSCOPY WITH LEFT  STENT PLACEMENT,LEFT RETROGRADE PYELOGRAM;  Surgeon: Cam Morene ORN, MD;  Location: [facility];  Service: Urology;  Laterality: Left;   CYSTOSCOPY/URETEROSCOPY/HOLMIUM LASER/STENT PLACEMENT Left 06/03/2021    Procedure: CYSTOSCOPY, LEFT RETROGRADE, URETEROSCOPY,HOLMIUM LASER,STENT EXCHANGE;  Surgeon: Cam Morene ORN, MD;  Location: [facility];  Service: Urology;  Laterality: Left;   INGUINAL HERNIA REPAIR   1980   KNEE ARTHROSCOPY   2007    right   LUMBAR EPIDURAL INJECTION   2013    Has as needed.   LUMBAR LAMINECTOMY/DECOMPRESSION MICRODISCECTOMY N/A 06/19/2013    Procedure: LUMBAR LAMINECTOMY/DECOMPRESSION MICRODISCECTOMY;  Surgeon: Oneil Rodgers Priestly, MD;  Location: [facility] OR;  Service: Orthopedics;  Laterality: N/A;  Lumbar 4-5 decompression   PACEMAKER  IMPLANT N/A 06/16/2017    Procedure: Pacemaker Implant;  Surgeon: Waddell Danelle ORN, MD;  Location: [facility] INVASIVE CV LAB;  Service: Cardiovascular;  Laterality: N/A;   TRIGGER FINGER RELEASE Right          Social History         Socioeconomic History   Marital status: Married      Spouse name: Darice   Number of children: 3   Years of education: Not on file   Highest education level: 12th grade  Occupational History   Not on file  Tobacco Use   Smoking status: Never   Smokeless tobacco: Never   Tobacco comments:      Married, 3 grown children. Retired 04/2002 from [employer] work  Psychologist, educational Use   Vaping status: Never Used  Substance and Sexual Activity   Alcohol use: Yes      Comment: RARE   Drug use: No   Sexual activity: Not Currently  Other Topics Concern   Not on file  Social History Narrative    Lives with wife and 1 son.     Social Drivers of Acupuncturist Strain: Low Risk  (12/18/2023)    Overall Financial Resource Strain (CARDIA)     Difficulty of Paying Living Expenses: Not hard at all  Food Insecurity: No Food Insecurity (12/18/2023)    Hunger Vital Sign     Worried About Running Out of Food in the Last Year: Never true     Ran Out of Food in the Last Year: Never true  Transportation Needs: No Transportation Needs (12/18/2023)    PRAPARE - Therapist, art (Medical): No     Lack of Transportation (Non-Medical): No  Physical Activity: Inactive (12/18/2023)    Exercise Vital Sign     Days of Exercise per Week: 0 days     Minutes of Exercise per Session: 20 min  Stress: No Stress Concern Present (12/18/2023)    [employer] of Occupational Health - Occupational Stress Questionnaire     Feeling of Stress : Not at all  Social Connections: Socially Isolated (12/18/2023)    Social Connection and Isolation Panel     Frequency of Communication with Friends and Family: Once a week     Frequency of Social Gatherings with Friends and Family: Once a week     Attends Religious Services: Never     Database administrator or Organizations: No     Attends Engineer, structural: Never     Marital Status: Married         Family History  Problem Relation Age of Onset   Heart failure Mother          died age 52   Heart failure Father          died age 14   Diabetes Other     Heart disease Neg Hx          Allergies       Allergies  Allergen Reactions   Penicillins Other (See Comments) and Anaphylaxis      Taste buds            Current Outpatient Medications  Medication Sig Dispense Refill   Acetaminophen  (TYLENOL  8 HOUR PO) Take 1,000 mg by mouth 3 (three) times daily.       aspirin  EC 81 MG tablet Take 81 mg by mouth daily.  atorvastatin  (LIPITOR) 20 MG tablet TAKE 1 TABLET DAILY AT     6:00PM 90 tablet 1   carvedilol  (COREG ) 6.25 MG tablet  Take 1 tablet (6.25 mg total) by mouth 2 (two) times daily with a meal. 180 tablet 3   cholecalciferol  (VITAMIN D ) 1000 UNITS tablet Take 1,000 Units by mouth daily.       DULoxetine (CYMBALTA) 30 MG capsule Take 30 mg by mouth every morning.       HYDROcodone -acetaminophen  (NORCO/VICODIN) 5-325 MG tablet Take 1 tablet by mouth every 6 (six) hours as needed for moderate pain.       metFORMIN  (GLUCOPHAGE -XR) 500 MG 24 hr tablet TAKE 1 TABLET TWICE A DAY 180 tablet 3   Multiple Vitamin (MULTIVITAMIN WITH MINERALS) TABS tablet Take 1 tablet by mouth daily.       niacin  (NIASPAN ) 1000 MG CR tablet TAKE 1 TABLET AT BEDTIME 90 tablet 3   TURMERIC CURCUMIN PO Take 2,000 mg by mouth in the morning and at bedtime.       warfarin (JANTOVEN ) 2 MG tablet TAKE 1 TO 2 TABLETS DAILY AS DIRECTED BY COUMADIN  CLINIC 110 tablet 1      No current facility-administered medications for this visit.      Imaging Results (Last 48 hours)  No results found.     Review of Systems:   A ROS was performed including pertinent positives and negatives as documented in the HPI.   Physical Exam :   Constitutional: NAD and appears stated age Neurological: Alert and oriented Psych: Appropriate affect and cooperative There were no vitals taken for this visit.    Comprehensive Musculoskeletal Exam:     Right hip with positive Trendelenburg gait.  There is tenderness about the posterior lateral glute with some radiation.  Weakness with abduction of the right hip which is essentially antigravity.  30 degrees internal rotation of the hip and external rotation without pain, negative FADIR     Imaging:   Xray (3 views right hip): Normal   MRI (right hip): Extensive clear tearing of the gluteus medius minimus with atrophy of both     I personally reviewed and interpreted the radiographs.     Assessment and Plan:   89 y.o. male with gluteus medius tone and atrophy with essentially a nonfunctional abductor mechanism.  I  did describe that I would recommend surgical intervention at this time or at least consider it given the fact that he is having persistent limp and is quite concerned about falling and gait.  Given the fact that he has had multiple injections and then he does have evidence of atrophy in his gluteal abductor mechanism I have recommended consideration of gluteus maximus tendon transfer.  I did discuss risks and limitations associated with this as well as the associated recovery timeframe.  After discussion he would like to proceed   -Plan for right hip gluteus maximus tendon transfer     After a lengthy discussion of treatment options, including risks, benefits, alternatives, complications of surgical and nonsurgical conservative options, the patient elected surgical repair.    The patient  is aware of the material risks  and complications including, but not limited to injury to adjacent structures, neurovascular injury, infection, numbness, bleeding, implant failure, thermal burns, stiffness, persistent pain, failure to heal, disease transmission from allograft, need for further surgery, dislocation, anesthetic risks, blood clots, risks of death,and others. The probabilities of surgical success and failure discussed with patient given their particular co-morbidities.The time and  nature of expected rehabilitation and recovery was discussed.The patient's questions were all answered preoperatively.  No barriers to understanding were noted. I explained the natural history of the disease process and Rx rationale.  I explained to the patient what I considered to be reasonable expectations given their personal situation.  The final treatment plan was arrived at through a shared patient decision making process model.       I personally saw and evaluated the patient, and participated in the management and treatment plan.   Elspeth Parker, MD Attending Physician, Orthopedic Surgery   This document was dictated  using Dragon voice recognition software. A reasonable attempt at proof reading has been made to minimize errors.

## 2024-05-06 NOTE — Discharge Instructions (Addendum)
 Discharge Instructions    Attending Surgeon: Elspeth Parker, MD Office Phone Number: [phone]   Diagnosis and Procedures:    Surgeries Performed: Right hip gluteus maximus tendon transfer  Discharge Plan:    Diet: Resume usual diet. Begin with light or bland foods.  Drink plenty of fluids.  Activity:  Weightbearing as tolerated with walker. You are advised to go home directly from the hospital or surgical center. Restrict your activities.  GENERAL INSTRUCTIONS: 1.  Please apply ice to your wound to help with swelling and inflammation. This will improve your comfort and your overall recovery following surgery.     2. Please call Dr. Danetta office at [phone] with questions Monday-Friday during business hours. If no one answers, please leave a message and someone should get back to the patient within 24 hours. For emergencies please call 911 or proceed to the emergency room.   3. Patient to notify surgical team if experiences any of the following: Bowel/Bladder dysfunction, uncontrolled pain, nerve/muscle weakness, incision with increased drainage or redness, nausea/vomiting and Fever greater than 101.0 F.  Be alert for signs of infection including redness, streaking, odor, fever or chills. Be alert for excessive pain or bleeding and notify your surgeon immediately.  WOUND INSTRUCTIONS:   Leave your dressing, cast, or splint in place until your post operative visit.  Keep it clean and dry.  Always keep the incision clean and dry until the staples/sutures are removed. If there is no drainage from the incision you should keep it open to air. If there is drainage from the incision you must keep it covered at all times until the drainage stops  Do not soak in a bath tub, hot tub, pool, lake or other body of water  until 21 days after your surgery and your incision is completely dry and healed.  If you have removable sutures (or staples) they must be removed 10-14 days  (unless otherwise instructed) from the day of your surgery.     1)  Elevate the extremity as much as possible.  2)  Keep the dressing clean and dry.  3)  Please call us  if the dressing becomes wet or dirty.  4)  If you are experiencing worsening pain or worsening swelling, please call.     MEDICATIONS: Resume all previous home medications at the previous prescribed dose and frequency unless otherwise noted Start taking the  pain medications on an as-needed basis as prescribed  Please taper down pain medication over the next week following surgery.  Ideally you should not require a refill of any narcotic pain medication.  Take pain medication with food to minimize nausea. In addition to the prescribed pain medication, you may take over-the-counter pain relievers such as Tylenol .  Do NOT take additional tylenol  if your pain medication already has tylenol  in it.  Aspirin  325mg  daily per instructions on bottle. Narcotic policy: Per [facility] clinic policy, our goal is ensure optimal postoperative pain control with a multimodal pain management strategy. For all [facility] patients, our goal is to wean post-operative narcotic medications by 6 weeks post-operatively, and many times sooner. If this is not possible due to utilization of pain medication prior to surgery, your [facility] doctor will support your acute post-operative pain control for the first 6 weeks postoperatively, with a plan to transition you back to your primary pain team following that. Maralee will work to ensure a Therapist, occupational.       FOLLOWUP INSTRUCTIONS: 1. Follow up at the Physical  Therapy Clinic 3-4 days following surgery. This appointment should be scheduled unless other arrangements have been made.The Physical Therapy scheduling number is [phone] if an appointment has not already been arranged.  2. Contact Dr. Danetta office during office hours at [phone] or the practice after hours line at [phone] for  non-emergencies. For medical emergencies call 911.   Discharge Location: Home  No Tylenol  until 2:51pm today, if needed.

## 2024-05-06 NOTE — Brief Op Note (Signed)
   Brief Op Note  Date of Surgery: 05/06/2024  Preoperative Diagnosis: RIGHT GLUTEUS MEDIUS TENDINOPATHY  Postoperative Diagnosis: same  Procedure: Procedure(s): REPAIR, TENDON, GLUTEUS MINIMUS  Implants: Implant Name Type Inv. Item Serial No. Manufacturer Lot No. LRB No. Used Action  ANCHOR JUGGERKNOT SOFT 2.9 - [id number] Anchor ANCHOR JUGGERKNOT SOFT 2.9  ZIMMER RECON(ORTH,TRAU,BIO,SG) [id number] Right 1 Implanted  ANCHOR JUGGERKNOT SOFT 2.9 - [id number] Anchor ANCHOR JUGGERKNOT SOFT 2.9  ZIMMER RECON(ORTH,TRAU,BIO,SG) [id number] Right 1 Implanted  ANCHOR SUT QUATTRO KNTLS 4.5 - [id number] Anchor ANCHOR SUT QUATTRO KNTLS 4.5  ZIMMER RECON(ORTH,TRAU,BIO,SG) [id number] Right 1 Implanted  ANCHOR SUT QUATTRO KNTLS 4.5 - [id number] Anchor ANCHOR SUT QUATTRO KNTLS 4.5  ZIMMER RECON(ORTH,TRAU,BIO,SG) [id number] Right 1 Implanted  IMPL TAPESTRY BIOINTEGR 40X30 - [id number] Mesh General IMPL TAPESTRY BIOINTEGR 40X30  ZIMMER RECON(ORTH,TRAU,BIO,SG) [id number] Right 1 Implanted    Surgeons: Surgeon(s): Genelle Standing, MD  Anesthesia: General    Estimated Blood Loss: See anesthesia record  Complications: None  Condition to PACU: Stable  Standing LITTIE Genelle, MD 05/06/2024 12:28 PM

## 2024-05-06 NOTE — Transfer of Care (Signed)
 Immediate Anesthesia Transfer of Care Note  Patient: Andrew [name]  Procedure(s) Performed: REPAIR, TENDON, GLUTEUS MINIMUS (Right: Buttocks)  Patient Location: PACU  Anesthesia Type:General  Level of Consciousness: awake, alert , and oriented  Airway & Oxygen Therapy: Patient Spontanous Breathing and Patient connected to nasal cannula oxygen  Post-op Assessment: Report given to RN and Post -op Vital signs reviewed and stable  Post vital signs: Reviewed and stable  Last Vitals:  Vitals Value Taken Time  BP 159/77 (102)   Temp    Pulse 60   Resp 12   SpO2 100     Last Pain:  Vitals:   05/06/24 0848  TempSrc: Temporal  PainSc: 0-No pain      Patients Stated Pain Goal: 4 (05/06/24 0848)  Complications: No notable events documented.

## 2024-05-07 ENCOUNTER — Ambulatory Visit (HOSPITAL_BASED_OUTPATIENT_CLINIC_OR_DEPARTMENT_OTHER): Admitting: Orthopaedic Surgery

## 2024-05-07 ENCOUNTER — Encounter (HOSPITAL_BASED_OUTPATIENT_CLINIC_OR_DEPARTMENT_OTHER): Payer: Self-pay | Admitting: Orthopaedic Surgery

## 2024-05-07 ENCOUNTER — Telehealth: Payer: Self-pay

## 2024-05-07 DIAGNOSIS — S76011A Strain of muscle, fascia and tendon of right hip, initial encounter: Secondary | ICD-10-CM

## 2024-05-07 NOTE — Telephone Encounter (Signed)
 Patient called and is reporting that his incision is bleeding. He will come here for us  to change bandage. Can you please add him to schedule?

## 2024-05-07 NOTE — Progress Notes (Signed)
 Patient was seen for nursing visit.  Dressing was changed without any evidence of active drainage.  He will follow-up regularly scheduled

## 2024-05-13 ENCOUNTER — Encounter: Payer: Self-pay | Admitting: Physical Therapy

## 2024-05-13 ENCOUNTER — Other Ambulatory Visit: Payer: Self-pay

## 2024-05-13 ENCOUNTER — Ambulatory Visit: Attending: Internal Medicine | Admitting: Physical Therapy

## 2024-05-13 DIAGNOSIS — S76011A Strain of muscle, fascia and tendon of right hip, initial encounter: Secondary | ICD-10-CM | POA: Diagnosis not present

## 2024-05-13 DIAGNOSIS — M25551 Pain in right hip: Secondary | ICD-10-CM | POA: Insufficient documentation

## 2024-05-13 DIAGNOSIS — R2681 Unsteadiness on feet: Secondary | ICD-10-CM | POA: Insufficient documentation

## 2024-05-13 DIAGNOSIS — M6281 Muscle weakness (generalized): Secondary | ICD-10-CM | POA: Diagnosis present

## 2024-05-13 NOTE — Therapy (Signed)
 OUTPATIENT PHYSICAL THERAPY LOWER EXTREMITY EVALUATION  Patient Name: Andrew [name] MRN: [id number] DOB:11/14/1933, 89 y.o., male Today's Date: 05/13/2024   PT End of Session - 05/13/24 0955     Visit Number 1    Number of Visits --   1-2x/week   Date for PT Re-Evaluation 07/08/24    Authorization Type UHC MCR - LEFS    Progress Note Due on Visit 10    PT Start Time 0930    PT Stop Time 1005    PT Time Calculation (min) 35 min          Past Medical History:  Diagnosis Date   Chronic kidney disease    CKD stg 3b   Colonic polyp 2011 last colo   colo q 43yr - [facility]    Coronary artery disease 02/2009   a. 02/2009 Cath: severe left main and three-vessel-->CABG; b. 12/2016 MVL EF 49%, no isch/infarct.   DEGENERATIVE JOINT DISEASE    Diastolic dysfunction    a. 10/2021 Echo: EF 55-60%, no rwma, mod conc LVH, GrI DD, mildly reduced RV fxn, RVSP 26.8 mmHg. mild AI, and aortic sclerosis.   Diverticulosis of colon    DIVERTICULOSIS, COLON    GERD (gastroesophageal reflux disease)    History of kidney stones    HYPERLIPIDEMIA-MIXED    HYPERTENSION, BENIGN    Interstitial lung disease (HCC)    LBP (low back pain)    s/p decompression 8/14; ESI - [place]; RFA x 3 early 2015   Pacemaker    a. 05/2017: St. Jude (serial number [id number]) pacemaker for CHB   Symptomatic bradycardia    a. s/p St. Jude (serial number [id number]) pacemaker 06/16/17   Type II or unspecified type diabetes mellitus without mention of complication, not stated as uncontrolled    VITAMIN D  DEFICIENCY    Past Surgical History:  Procedure Laterality Date   CARPAL TUNNEL RELEASE Right    CATARACT EXTRACTION, BILATERAL     R 07/07/13, L 07/21/13   CORONARY ARTERY BYPASS GRAFT  03/17/09   x 4   CYSTOSCOPY WITH STENT PLACEMENT Left 05/10/2021   Procedure: CYSTOSCOPY WITH LEFT  STENT PLACEMENT,LEFT RETROGRADE PYELOGRAM;  Surgeon: Cam Morene ORN, MD;  Location: [facility];  Service: Urology;  Laterality: Left;    CYSTOSCOPY/URETEROSCOPY/HOLMIUM LASER/STENT PLACEMENT Left 06/03/2021   Procedure: CYSTOSCOPY, LEFT RETROGRADE, URETEROSCOPY,HOLMIUM LASER,STENT EXCHANGE;  Surgeon: Cam Morene ORN, MD;  Location: [facility];  Service: Urology;  Laterality: Left;   GLUTEUS MINIMUS REPAIR Right 05/06/2024   Procedure: REPAIR, TENDON, GLUTEUS MINIMUS;  Surgeon: Genelle Standing, MD;  Location: [facility];  Service: Orthopedics;  Laterality: Right;  RIGHT GLUTEUS MAXIMUS TENDON TRANSFER   INGUINAL HERNIA REPAIR  1980   KNEE ARTHROSCOPY  2007   right   LUMBAR EPIDURAL INJECTION  2013   Has as needed.   LUMBAR LAMINECTOMY/DECOMPRESSION MICRODISCECTOMY N/A 06/19/2013   Procedure: LUMBAR LAMINECTOMY/DECOMPRESSION MICRODISCECTOMY;  Surgeon: Oneil Rodgers Priestly, MD;  Location: [facility];  Service: Orthopedics;  Laterality: N/A;  Lumbar 4-5 decompression   PACEMAKER IMPLANT N/A 06/16/2017   Procedure: Pacemaker Implant;  Surgeon: Waddell Danelle ORN, MD;  Location: [facility] INVASIVE CV LAB;  Service: Cardiovascular;  Laterality: N/A;   TRIGGER FINGER RELEASE Right    Patient Active Problem List   Diagnosis Date Noted   Weight loss 04/01/2024   Tear of right gluteus minimus tendon 12/21/2023   Anemia 04/02/2023   Hematoma 03/18/2023   Supratherapeutic INR 03/18/2023   Pulmonary fibrosis (HCC) 04/02/2022  Poor balance 12/28/2021   Bilateral leg weakness 04/01/2021   CKD (chronic kidney disease) stage 3, GFR 30-59 ml/min (HCC) 03/31/2021   Pacemaker 07/21/2019   History of deep venous thrombosis (DVT) of distal vein of left lower extremity 08/28/2017   Complete heart block (HCC) - PPM 06/15/2017   Wears hearing aid 09/25/2016   Degeneration of lumbar or lumbosacral intervertebral disc 03/23/2015   Vitamin D  deficiency 11/03/2010   Diverticulosis of colon 11/03/2010   Osteoarthritis (arthritis due to wear and tear of joints) 11/03/2010   Dyslipidemia, goal LDL below 70 12/01/2009   CAD, ARTERY BYPASS GRAFT  12/01/2009   DOE (dyspnea on exertion) 12/01/2009   Type 2 diabetes mellitus with neurological manifestations, controlled (HCC)     PCP: Geofm Glade PARAS, MD  REFERRING PROVIDER: Genelle Standing, MD  THERAPY DIAG:  Pain in right hip  Muscle weakness  Unsteadiness on feet  REFERRING DIAG: Tear of right gluteus minimus tendon, initial encounter [S76.011A]   Rationale for Evaluation and Treatment:  Rehabilitation  SUBJECTIVE:  PERTINENT PAST HISTORY:  Right hip gluteus maximus tendon transfer and Right hip open trochanteric bursectomy 7/15 for glute med repair, DM, CAD with bypass 2017, DOE, laminectomy, pacemaker, hx of DVT, blood thinner       PRECAUTIONS: see protocol below  WEIGHT BEARING RESTRICTIONS   WBAT per OP Note  OP Date: 05/06/2024  2 weeks 05/20/2024  4 weeks 06/03/2024  6 weeks 06/17/2024  8 weeks 07/01/2024  10 weeks 07/15/2024  12 weeks 07/29/2024   Glute med protocol:   [email or web address]  FALLS:  Has patient fallen in last 6 months? No, Number of falls: 0  MOI/History of condition:  Onset date: 7/15  SUBJECTIVE STATEMENT  Andrew [name] is a 89 y.o. male who presents to clinic with chief complaint of chronic R hip pain and stiffness.  Well known to this PT and clinic.  Chronic glute min/med tear which was was repaired on 7/15 using glute max tendon transfer.  He has had quite a bit of pain since the surgery but this is improving.  1 dressing change with no current drainage.  Has son at home who has been helpful.   Red flags:  denies malaise, erythema / streaking, and chills / night sweats  Pain:  Are you having pain? Yes Pain location: R hip pain NPRS scale:  Best: 0/10, Worst: 9/10 Aggravating factors: walking, standing  Relieving factors: rest, sitting Pain description: intermittent  Occupation:  retired   Education administrator: [employer]   Hand Dominance: NA  Patient Goals/Specific Activities: reduce pain, improve ability to complete ADLs   OBJECTIVE:   GENERAL OBSERVATION/GAIT: Antalgic gait with reduced time in stance on R  LE MMT:  MMT Right (Eval) Left (Eval)  Hip flexion (L2, L3)    Knee extension (L3)    Knee flexion    Hip abduction    Hip extension    Hip external rotation    Hip internal rotation    Hip adduction    Ankle dorsiflexion (L4)    Ankle plantarflexion (S1)    Ankle inversion    Ankle eversion    Great Toe ext (L5)    Grossly     (Blank rows = not tested, score listed is out of 5 possible points.  N = WNL, D = diminished, C = clear for gross weakness with myotome testing, * = concordant pain with testing)  LE ROM:  ROM Right (Eval) Left (Eval)  Hip flexion 90 (  stopped per protocol)    Hip extension    Hip abduction    Hip adduction    Hip internal rotation    Hip external rotation    Knee extension    Knee flexion    Ankle dorsiflexion    Ankle plantarflexion    Ankle inversion    Ankle eversion     (Blank rows = not tested, N = WNL, * = concordant pain with testing)  Functional Tests  Eval    6 MWT: unable to participate d/t pain                                                          PATIENT SURVEYS:  LEFS: 16/80   TODAY'S TREATMENT:  Therapeutic Exercise: Creating, reviewing, and completing below HEP   PATIENT EDUCATION ([place]/[facility]):  POC, diagnosis, prognosis, HEP, and outcome measures.  Pt educated via explanation, demonstration, and handout (HEP).  Pt confirms understanding verbally.   HOME EXERCISE PROGRAM: Access Code: [email or web address] URL: [email or web address] Date: 05/13/2024 Prepared by: Helene Gasmen  Exercises - Supine Gluteal Sets  - 1-2 x daily - 7 x weekly - 2 sets - 10 reps - 5'' hold - Supine Hip Adduction Isometric with Ball  - 1-2 x daily - 7 x weekly - 2 sets - 10 reps - 5''  hold - Supine Heel Slide  - 1-2 x daily - 7 x weekly - 2 sets - 10 reps  Treatment priorities   Eval                                                  ASSESSMENT:  CLINICAL IMPRESSION: Andrew [name] is a 89 y.o. male who presents to clinic with signs and sxs consistent with R hip pain following glute min repair on 7/15.   Still having high pain levels but only about 1 week out at this point.  Started with some sub max isometrics and gentle ROM.  Did not complete strength testing d/t precautions and pain.   Andrew [name] will benefit from skilled PT to address relevant deficits and improve ability to complete daily tasks such as walking, standing, and bending in order to care for himself and his wife.   OBJECTIVE IMPAIRMENTS: Pain, hip ROM, hip strength, gait, balance  ACTIVITY LIMITATIONS: walking, standing, bending, limiting, squatting  PERSONAL FACTORS: See medical history and pertinent history   REHAB POTENTIAL: Good  CLINICAL DECISION MAKING: Evolving/moderate complexity  EVALUATION COMPLEXITY: Moderate   GOALS:   SHORT TERM GOALS: Target date: 06/10/2024   Andrew [name] will be >75% HEP compliant to improve carryover between sessions and facilitate independent management of condition  Evaluation: ongoing Goal status: INITIAL   LONG TERM GOALS: Target date: 07/08/2024   Andrew [name] will self report >/= 50% decrease in pain from evaluation to improve function in daily tasks  Evaluation/Baseline: 8/10 max pain Goal status: INITIAL   2.  Andrew [name] will show a >/= 27 pt improvement in LEFS score (MCID is ~11% or 9 pts) as a proxy for functional improvement   Evaluation/Baseline: 16/80 pts Goal status: INITIAL   3.  Andrew [name] will be able to complete household tasks including caring for his wife,  not limited by pain  Evaluation/Baseline: limited Goal status: INITIAL   4.  Andrew [name] will improve 6 minute walk test to age adjusted norm (MCID 50 m).  Evaluation/Baseline: not tested Goal status:  INITIAL     5.  Andrew [name] will improve 30'' STS (MCID 2) to >/= 10x (w/ UE?: N) to show improved LE strength and improved transfers   Evaluation/Baseline: 10 tested Goal status: INITIAL   PLAN: PT FREQUENCY: 1-2x/week  PT DURATION: 8 weeks  PLANNED INTERVENTIONS:  97164- PT Re-evaluation, 97110-Therapeutic exercises, 97530- Therapeutic activity, [id number]- Neuromuscular re-education, 97535- Self Care, 02859- Manual therapy, [id number]- Gait training, [id number]- Aquatic Therapy, [phone]- Electrical stimulation (manual), [id number]- Vasopneumatic device, [id number]- Traction (mechanical), [id number]- Ionotophoresis 4mg /ml Dexamethasone , Taping, Dry Needling, Joint manipulation, and Spinal manipulation.   Helene Gasmen PT, DPT 05/13/2024, 11:12 AM  Name: Rudi Bunyard  MRN: [id number] Please request 2x/week for 12 weeks.    Check all possible CPT codes: 02889- Therapeutic Exercise, [phone]- Neuro Re-education, [phone] - Gait Training, [phone] - Manual Therapy, 97530 - Therapeutic Activities, 97535 - Self Care, [phone] - Re-evaluation, [id number] - Mechanical traction, and [id number] - Aquatic therapy   Thank you!  MCD - Secure   Date of referral: 6/26 Referring provider: Genelle Referring diagnosis? Tear of right gluteus minimus tendon, initial encounter [S76.011A]  Treatment diagnosis? (if different than referring diagnosis) NA  What was this (referring dx) caused by? Surgery (Type: glute med repair)  Lysle of Condition: Initial Onset (within last 3 months)   Laterality: Rt  Current Functional Measure Score: LEFS 16/80  Objective measurements identify impairments when they are compared to normal values, the uninvolved extremity, and prior level of function.  [x]  Yes  []  No  Objective assessment of functional ability: Severe functional limitations   Briefly describe symptoms: difficulty in walking and daily tasks including self care and care for his wife  How did symptoms start: surgery  Average pain  intensity:  Last 24 hours: 8  Past week: 8  How often does the pt experience symptoms? Frequently  How much have the symptoms interfered with usual daily activities? Extremely  How has condition changed since care began at this facility? NA - initial visit  In general, how is the patients overall health? Good   BACK PAIN (STarT Back Screening Tool) No

## 2024-05-19 ENCOUNTER — Ambulatory Visit: Admitting: Physical Therapy

## 2024-05-19 ENCOUNTER — Encounter: Payer: Self-pay | Admitting: Physical Therapy

## 2024-05-19 DIAGNOSIS — M25551 Pain in right hip: Secondary | ICD-10-CM | POA: Diagnosis not present

## 2024-05-19 DIAGNOSIS — M6281 Muscle weakness (generalized): Secondary | ICD-10-CM

## 2024-05-19 DIAGNOSIS — R2681 Unsteadiness on feet: Secondary | ICD-10-CM

## 2024-05-19 NOTE — Therapy (Unsigned)
 OUTPATIENT PHYSICAL THERAPY DAILY NOTE  Patient Name: Andrew [name] MRN: [id number] DOB:Oct 05, 1934, 89 y.o., male Today's Date: 05/20/2024   PT End of Session - 05/19/24 1545     Visit Number 2    Number of Visits --   1-2x/week   Date for PT Re-Evaluation 07/08/24    Authorization Type UHC MCR - LEFS    Progress Note Due on Visit 10    PT Start Time 1545    PT Stop Time 1626    PT Time Calculation (min) 41 min          Past Medical History:  Diagnosis Date   Chronic kidney disease    CKD stg 3b   Colonic polyp 2011 last colo   colo q 20yr - [facility]    Coronary artery disease 02/2009   a. 02/2009 Cath: severe left main and three-vessel-->CABG; b. 12/2016 MVL EF 49%, no isch/infarct.   DEGENERATIVE JOINT DISEASE    Diastolic dysfunction    a. 10/2021 Echo: EF 55-60%, no rwma, mod conc LVH, GrI DD, mildly reduced RV fxn, RVSP 26.8 mmHg. mild AI, and aortic sclerosis.   Diverticulosis of colon    DIVERTICULOSIS, COLON    GERD (gastroesophageal reflux disease)    History of kidney stones    HYPERLIPIDEMIA-MIXED    HYPERTENSION, BENIGN    Interstitial lung disease (HCC)    LBP (low back pain)    s/p decompression 8/14; ESI - [place]; RFA x 3 early 2015   Pacemaker    a. 05/2017: St. Jude (serial number [id number]) pacemaker for CHB   Symptomatic bradycardia    a. s/p St. Jude (serial number [id number]) pacemaker 06/16/17   Type II or unspecified type diabetes mellitus without mention of complication, not stated as uncontrolled    VITAMIN D  DEFICIENCY    Past Surgical History:  Procedure Laterality Date   CARPAL TUNNEL RELEASE Right    CATARACT EXTRACTION, BILATERAL     R 07/07/13, L 07/21/13   CORONARY ARTERY BYPASS GRAFT  03/17/09   x 4   CYSTOSCOPY WITH STENT PLACEMENT Left 05/10/2021   Procedure: CYSTOSCOPY WITH LEFT  STENT PLACEMENT,LEFT RETROGRADE PYELOGRAM;  Surgeon: Cam Morene ORN, MD;  Location: [facility];  Service: Urology;  Laterality: Left;    CYSTOSCOPY/URETEROSCOPY/HOLMIUM LASER/STENT PLACEMENT Left 06/03/2021   Procedure: CYSTOSCOPY, LEFT RETROGRADE, URETEROSCOPY,HOLMIUM LASER,STENT EXCHANGE;  Surgeon: Cam Morene ORN, MD;  Location: [facility];  Service: Urology;  Laterality: Left;   GLUTEUS MINIMUS REPAIR Right 05/06/2024   Procedure: REPAIR, TENDON, GLUTEUS MINIMUS;  Surgeon: Genelle Standing, MD;  Location: [facility];  Service: Orthopedics;  Laterality: Right;  RIGHT GLUTEUS MAXIMUS TENDON TRANSFER   INGUINAL HERNIA REPAIR  1980   KNEE ARTHROSCOPY  2007   right   LUMBAR EPIDURAL INJECTION  2013   Has as needed.   LUMBAR LAMINECTOMY/DECOMPRESSION MICRODISCECTOMY N/A 06/19/2013   Procedure: LUMBAR LAMINECTOMY/DECOMPRESSION MICRODISCECTOMY;  Surgeon: Oneil Rodgers Priestly, MD;  Location: [facility];  Service: Orthopedics;  Laterality: N/A;  Lumbar 4-5 decompression   PACEMAKER IMPLANT N/A 06/16/2017   Procedure: Pacemaker Implant;  Surgeon: Waddell Danelle ORN, MD;  Location: [facility] INVAS[facility] CV LAB;  Service: Cardiovascular;  Laterality: N/A;   TRIGGER FINGER RELEASE Right    Patient Active Problem List   Diagnosis Date Noted   Weight loss 04/01/2024   Tear of right gluteus minimus tendon 12/21/2023   Anemia 04/02/2023   Hematoma 03/18/2023   Supratherapeutic INR 03/18/2023   Pulmonary fibrosis (HCC) 04/02/2022  Poor balance 12/28/2021   Bilateral leg weakness 04/01/2021   CKD (chronic kidney disease) stage 3, GFR 30-59 ml/min (HCC) 03/31/2021   Pacemaker 07/21/2019   History of deep venous thrombosis (DVT) of distal vein of left lower extremity 08/28/2017   Complete heart block (HCC) - PPM 06/15/2017   Wears hearing aid 09/25/2016   Degeneration of lumbar or lumbosacral intervertebral disc 03/23/2015   Vitamin D  deficiency 11/03/2010   Diverticulosis of colon 11/03/2010   Osteoarthritis (arthritis due to wear and tear of joints) 11/03/2010   Dyslipidemia, goal LDL below 70 12/01/2009   CAD, ARTERY BYPASS GRAFT  12/01/2009   DOE (dyspnea on exertion) 12/01/2009   Type 2 diabetes mellitus with neurological manifestations, controlled (HCC)     PCP: Geofm Glade PARAS, MD  REFERRING PROVIDER: Geofm Glade PARAS, MD  THERAPY DIAG:  Pain in right hip  Muscle weakness  Unsteadiness on feet  REFERRING DIAG: Tear of right gluteus minimus tendon, initial encounter [S76.011A]   Rationale for Evaluation and Treatment:  Rehabilitation  SUBJECTIVE:  PERTINENT PAST HISTORY:  Right hip gluteus maximus tendon transfer and Right hip open trochanteric bursectomy 7/15 for glute med repair, DM, CAD with bypass 2017, DOE, laminectomy, pacemaker, hx of DVT, blood thinner       PRECAUTIONS: see protocol below  WEIGHT BEARING RESTRICTIONS   WBAT per OP Note  OP Date: 05/06/2024  2 weeks 05/20/2024  4 weeks 06/03/2024  6 weeks 06/17/2024  8 weeks 07/01/2024  10 weeks 07/15/2024  12 weeks 07/29/2024   Glute med protocol:   [email or web address]  FALLS:  Has patient fallen in last 6 months? No, Number of falls: 0  MOI/History of condition:  Onset date: 7/15  SUBJECTIVE STATEMENT  05/20/2024: Pt reports continued tenderness but some improvement since evaluation  EVAL: Andrew [name] is a 89 y.o. male who presents to clinic with chief complaint of chronic R hip pain and stiffness.  Well known to this PT and clinic.  Chronic glute min/med tear which was was repaired on 7/15 using glute max tendon transfer.  He has had quite a bit of pain since the surgery but this is improving.  1 dressing change with no current drainage.  Has son at home who has been helpful.   Red flags:  denies malaise, erythema / streaking, and chills / night sweats  Pain:  Are you having pain? Yes Pain location: R hip pain NPRS scale:  Best: 0/10, Worst: 9/10 Aggravating factors: walking,  standing  Relieving factors: rest, sitting Pain description: intermittent  Occupation: retired   Education administrator: [employer]   Hand Dominance: NA  Patient Goals/Specific Activities: reduce pain, improve ability to complete ADLs   OBJECTIVE:   GENERAL OBSERVATION/GAIT: Antalgic gait with reduced time in stance on R  LE MMT:  MMT Right (Eval) Left (Eval)  Hip flexion (L2, L3)    Knee extension (L3)    Knee flexion    Hip abduction    Hip extension    Hip external rotation    Hip internal rotation    Hip adduction    Ankle dorsiflexion (L4)    Ankle plantarflexion (S1)    Ankle inversion    Ankle eversion    Great Toe ext (L5)    Grossly     (Blank rows = not tested, score listed is out of 5 possible points.  N = WNL, D = diminished, C = clear for gross weakness with myotome testing, * = concordant pain with testing)  LE ROM:  ROM Right (Eval) Left (Eval)  Hip flexion 90 (stopped per protocol)    Hip extension    Hip abduction    Hip adduction    Hip internal rotation    Hip external rotation    Knee extension    Knee flexion    Ankle dorsiflexion    Ankle plantarflexion    Ankle inversion    Ankle eversion     (Blank rows = not tested, N = WNL, * = concordant pain with testing)  Functional Tests  Eval    6 MWT: unable to participate d/t pain                                                          PATIENT SURVEYS:  LEFS: 16/80   TODAY'S TREATMENT:  [facility] Adult PT Treatment  05/19/2024:  Therapeutic Exercise: Bike - 10 min SAQ - 5'' - 2x10 HS curl - GTB   Neuromuscular re-ed: Glute setting - 5'' hold 2x10 PPT   Manual Therapy: PROM to 90 degrees of flexion PROM abd    HOME EXERCISE PROGRAM: Access Code: [email or web address] URL: [email or web address] Date: 05/13/2024 Prepared by: Helene Gasmen  Exercises - Supine Gluteal Sets  - 1-2 x daily - 7 x weekly - 2 sets - 10 reps - 5'' hold - Supine Hip Adduction Isometric  with Ball  - 1-2 x daily - 7 x weekly - 2 sets - 10 reps - 5'' hold - Supine Heel Slide  - 1-2 x daily - 7 x weekly - 2 sets - 10 reps  Treatment priorities   Eval                                                  ASSESSMENT:  CLINICAL IMPRESSION: Andrew [name] is a 89 y.o. male who presents to clinic with signs and sxs consistent with R hip pain following glute min repair on 7/15.   Still having high pain levels but only about 1 week out at this point.  Started with some sub max isometrics and gentle ROM.  Did not complete strength testing d/t precautions and pain.   Andrew [name] will benefit from skilled PT to address relevant deficits and improve ability to complete daily tasks such as walking, standing, and bending in order to care for himself and his wife.   OBJECTIVE IMPAIRMENTS: Pain, hip ROM, hip strength, gait, balance  ACTIVITY LIMITATIONS: walking, standing, bending, limiting, squatting  PERSONAL FACTORS: See medical history and pertinent history   REHAB POTENTIAL: Good  CLINICAL DECISION MAKING: Evolving/moderate complexity  EVALUATION COMPLEXITY: Moderate   GOALS:   SHORT TERM GOALS: Target date: 06/10/2024   Andrew [name] will be >75% HEP compliant to improve carryover between sessions and facilitate independent management of condition  Evaluation: ongoing Goal status: INITIAL   LONG TERM GOALS: Target date: 07/08/2024   Andrew [name] will self report >/= 50% decrease in pain from evaluation to improve function in daily tasks  Evaluation/Baseline: 8/10 max pain Goal status: INITIAL   2.  Andrew [name] will show a >/= 27 pt improvement in LEFS score (MCID is ~11% or 9 pts) as a proxy for functional improvement  Evaluation/Baseline: 16/80 pts Goal status: INITIAL   3.  Andrew [name] will be able to complete household tasks including caring for his wife, not limited by pain  Evaluation/Baseline: limited Goal status: INITIAL   4.  Andrew [name] will improve 6 minute walk test to age adjusted norm  (MCID 50 m).  Evaluation/Baseline: not tested Goal status: INITIAL     5.  Andrew [name] will improve 30'' STS (MCID 2) to >/= 10x (w/ UE?: N) to show improved LE strength and improved transfers   Evaluation/Baseline: 10 tested Goal status: INITIAL   PLAN: PT FREQUENCY: 1-2x/week  PT DURATION: 8 weeks  PLANNED INTERVENTIONS:  97164- PT Re-evaluation, 97110-Therapeutic exercises, 97530- Therapeutic activity, [id number]- Neuromuscular re-education, 97535- Self Care, 02859- Manual therapy, [id number]- Gait training, [id number]- Aquatic Therapy, [phone]- Electrical stimulation (manual), [id number]- Vasopneumatic device, [id number]- Traction (mechanical), [id number]- Ionotophoresis 4mg /ml Dexamethasone , Taping, Dry Needling, Joint manipulation, and Spinal manipulation.   Helene Gasmen PT, DPT 05/20/2024, 7:13 AM  Name: Andrew [name]  MRN: [id number] Please request 2x/week for 12 weeks.    Check all possible CPT codes: 02889- Therapeutic Exercise, [phone]- Neuro Re-education, [phone] - Gait Training, [phone] - Manual Therapy, 97530 - Therapeutic Activities, 97535 - Self Care, [phone] - Re-evaluation, [id number] - Mechanical traction, and [id number] - Aquatic therapy   Thank you!  MCD - Secure   Date of referral: 6/26 Referring provider: Genelle Referring diagnosis? Tear of right gluteus minimus tendon, initial encounter [S76.011A]  Treatment diagnosis? (if different than referring diagnosis) NA  What was this (referring dx) caused by? Surgery (Type: glute med repair)  Lysle of Condition: Initial Onset (within last 3 months)   Laterality: Rt  Current Functional Measure Score: LEFS 16/80  Objective measurements identify impairments when they are compared to normal values, the uninvolved extremity, and prior level of function.  [x]  Yes  []  No  Objective assessment of functional ability: Severe functional limitations   Briefly describe symptoms: difficulty in walking and daily tasks including self care and care for  his wife  How did symptoms start: surgery  Average pain intensity:  Last 24 hours: 8  Past week: 8  How often does the pt experience symptoms? Frequently  How much have the symptoms interfered with usual daily activities? Extremely  How has condition changed since care began at this facility? NA - initial visit  In general, how is the patients overall health? Good   BACK PAIN (STarT Back Screening Tool) No

## 2024-05-20 ENCOUNTER — Encounter: Admitting: Physical Therapy

## 2024-05-20 ENCOUNTER — Encounter: Payer: Self-pay | Admitting: Physical Therapy

## 2024-05-21 ENCOUNTER — Ambulatory Visit (HOSPITAL_BASED_OUTPATIENT_CLINIC_OR_DEPARTMENT_OTHER): Admitting: Orthopaedic Surgery

## 2024-05-21 DIAGNOSIS — S76011A Strain of muscle, fascia and tendon of right hip, initial encounter: Secondary | ICD-10-CM

## 2024-05-21 NOTE — Progress Notes (Signed)
 Post Operative Evaluation    Procedure/Date of Surgery: Right hip gluteus maximus tendon transfer 7/15  Interval History:    Presents 2 weeks status post above procedure.  Overall he is doing quite well.  He has been doing physical therapy and strengthening is coming along nicely.  His neurosensory exam is intact.  He is just walking with a cane   PMH/PSH/Family History/Social History/Meds/Allergies:    Past Medical History:  Diagnosis Date   Chronic kidney disease    CKD stg 3b   Colonic polyp 2011 last colo   colo q 58yr - [facility]    Coronary artery disease 02/2009   a. 02/2009 Cath: severe left main and three-vessel-->CABG; b. 12/2016 MVL EF 49%, no isch/infarct.   DEGENERATIVE JOINT DISEASE    Diastolic dysfunction    a. 10/2021 Echo: EF 55-60%, no rwma, mod conc LVH, GrI DD, mildly reduced RV fxn, RVSP 26.8 mmHg. mild AI, and aortic sclerosis.   Diverticulosis of colon    DIVERTICULOSIS, COLON    GERD (gastroesophageal reflux disease)    History of kidney stones    HYPERLIPIDEMIA-MIXED    HYPERTENSION, BENIGN    Interstitial lung disease (HCC)    LBP (low back pain)    s/p decompression 8/14; ESI - [place]; RFA x 3 early 2015   Pacemaker    a. 05/2017: St. Jude (serial number [id number]) pacemaker for CHB   Symptomatic bradycardia    a. s/p St. Jude (serial number [id number]) pacemaker 06/16/17   Type II or unspecified type diabetes mellitus without mention of complication, not stated as uncontrolled    VITAMIN D  DEFICIENCY    Past Surgical History:  Procedure Laterality Date   CARPAL TUNNEL RELEASE Right    CATARACT EXTRACTION, BILATERAL     R 07/07/13, L 07/21/13   CORONARY ARTERY BYPASS GRAFT  03/17/09   x 4   CYSTOSCOPY WITH STENT PLACEMENT Left 05/10/2021   Procedure: CYSTOSCOPY WITH LEFT  STENT PLACEMENT,LEFT RETROGRADE PYELOGRAM;  Surgeon: Cam Morene ORN, MD;  Location: [facility];  Service: Urology;  Laterality: Left;    CYSTOSCOPY/URETEROSCOPY/HOLMIUM LASER/STENT PLACEMENT Left 06/03/2021   Procedure: CYSTOSCOPY, LEFT RETROGRADE, URETEROSCOPY,HOLMIUM LASER,STENT EXCHANGE;  Surgeon: Cam Morene ORN, MD;  Location: [facility];  Service: Urology;  Laterality: Left;   GLUTEUS MINIMUS REPAIR Right 05/06/2024   Procedure: REPAIR, TENDON, GLUTEUS MINIMUS;  Surgeon: Genelle Standing, MD;  Location: [facility];  Service: Orthopedics;  Laterality: Right;  RIGHT GLUTEUS MAXIMUS TENDON TRANSFER   INGUINAL HERNIA REPAIR  1980   KNEE ARTHROSCOPY  2007   right   LUMBAR EPIDURAL INJECTION  2013   Has as needed.   LUMBAR LAMINECTOMY/DECOMPRESSION MICRODISCECTOMY N/A 06/19/2013   Procedure: LUMBAR LAMINECTOMY/DECOMPRESSION MICRODISCECTOMY;  Surgeon: Oneil Rodgers Priestly, MD;  [facility];  Service: Orthopedics;  Laterality: N/A;  Lumbar 4-5 decompression   PACEMAKER IMPLANT N/A 06/16/2017   Procedure: Pacemaker Implant;  Surgeon: Waddell Danelle ORN, MD;  Location: [facility] INVASIVE CV LAB;  Service: Cardiovascular;  Laterality: N/A;   TRIGGER FINGER RELEASE Right    Social History   Socioeconomic History   Marital status: Married    Spouse name: Darice   Number of children: 3   Years of education: Not on file   Highest education level: 12th grade  Occupational History   Not  on file  Tobacco Use   Smoking status: Never   Smokeless tobacco: Never   Tobacco comments:    Married, 3 grown children. Retired 04/2002 from [employer] work  Psychologist, educational Use   Vaping status: Never Used  Substance and Sexual Activity   Alcohol use: Yes    Comment: RARE   Drug use: No   Sexual activity: Not Currently  Other Topics Concern   Not on file  Social History Narrative   Lives with wife and 1 son.   Social Drivers of Corporate investment banker Strain: Low Risk  (12/18/2023)   Overall Financial Resource Strain (CARDIA)    Difficulty of Paying Living Expenses: Not hard at all  Food Insecurity: No Food Insecurity  (12/18/2023)   Hunger Vital Sign    Worried About Running Out of Food in the Last Year: Never true    Ran Out of Food in the Last Year: Never true  Transportation Needs: No Transportation Needs (12/18/2023)   PRAPARE - Administrator, Civil Service (Medical): No    Lack of Transportation (Non-Medical): No  Physical Activity: Inactive (12/18/2023)   Exercise Vital Sign    Days of Exercise per Week: 0 days    Minutes of Exercise per Session: 20 min  Stress: No Stress Concern Present (12/18/2023)   [employer] of Occupational Health - Occupational Stress Questionnaire    Feeling of Stress : Not at all  Social Connections: Socially Isolated (12/18/2023)   Social Connection and Isolation Panel    Frequency of Communication with Friends and Family: Once a week    Frequency of Social Gatherings with Friends and Family: Once a week    Attends Religious Services: Never    Database administrator or Organizations: No    Attends Engineer, structural: Never    Marital Status: Married   Family History  Problem Relation Age of Onset   Heart failure Mother        died age 68   Heart failure Father        died age 73   Diabetes Other    Heart disease Neg Hx    Allergies  Allergen Reactions   Penicillins Other (See Comments) and Anaphylaxis    Taste buds   Current Outpatient Medications  Medication Sig Dispense Refill   Acetaminophen  (TYLENOL  8 HOUR PO) Take 1,000 mg by mouth 3 (three) times daily.     aspirin  EC 325 MG tablet Take 1 tablet (325 mg total) by mouth daily. 14 tablet 0   atorvastatin  (LIPITOR) 20 MG tablet TAKE 1 TABLET DAILY AT     6:00PM 90 tablet 1   carvedilol  (COREG ) 6.25 MG tablet Take 1 tablet (6.25 mg total) by mouth 2 (two) times daily with a meal. 180 tablet 3   cholecalciferol  (VITAMIN D ) 1000 UNITS tablet Take 1,000 Units by mouth daily.     DULoxetine (CYMBALTA) 30 MG capsule Take 30 mg by mouth every morning.     metFORMIN  (GLUCOPHAGE -XR)  500 MG 24 hr tablet TAKE 1 TABLET TWICE A DAY 180 tablet 3   Multiple Vitamin (MULTIVITAMIN WITH MINERALS) TABS tablet Take 1 tablet by mouth daily.     niacin  (NIASPAN ) 1000 MG CR tablet TAKE 1 TABLET AT BEDTIME 90 tablet 3   oxyCODONE  (ROXICODONE ) 5 MG immediate release tablet Take 1 tablet (5 mg total) by mouth every 4 (four) hours as needed for severe pain (pain score 7-10) or breakthrough  pain. 10 tablet 0   TURMERIC CURCUMIN PO Take 2,000 mg by mouth in the morning and at bedtime.     warfarin (JANTOVEN ) 2 MG tablet TAKE 1 TO 2 TABLETS DAILY AS DIRECTED BY COUMADIN  CLINIC 110 tablet 1   No current facility-administered medications for this visit.   No results found.  Review of Systems:   A ROS was performed including pertinent positives and negatives as documented in the HPI.   Musculoskeletal Exam:    There were no vitals taken for this visit.  Right hip incision is well-appearing without erythema or drainage.  Walks with a mildly antalgic gait.  Distal neurosensory exam is intact 30 degrees internal/external rotation of the right hip without pain  Imaging:      I personally reviewed and interpreted the radiographs.   Assessment:   Presents 2 weeks status post right hip gluteus maximus tendon transfer overall doing well.  At this time we will continue to work on strengthening and gait.  I will plan to see him back in 4 weeks for reassessment  Plan :    - Return to clinic as 4 weeks      I personally saw and evaluated the patient, and participated in the management and treatment plan.  Elspeth Parker, MD Attending Physician, Orthopedic Surgery  This document was dictated using Dragon voice recognition software. A reasonable attempt at proof reading has been made to minimize errors.

## 2024-05-27 ENCOUNTER — Ambulatory Visit: Admitting: Physical Therapy

## 2024-06-02 ENCOUNTER — Encounter: Payer: Self-pay | Admitting: Physical Therapy

## 2024-06-02 ENCOUNTER — Ambulatory Visit: Attending: Internal Medicine | Admitting: Physical Therapy

## 2024-06-02 DIAGNOSIS — R2681 Unsteadiness on feet: Secondary | ICD-10-CM | POA: Diagnosis present

## 2024-06-02 DIAGNOSIS — M6281 Muscle weakness (generalized): Secondary | ICD-10-CM | POA: Diagnosis present

## 2024-06-02 DIAGNOSIS — M25551 Pain in right hip: Secondary | ICD-10-CM | POA: Insufficient documentation

## 2024-06-02 DIAGNOSIS — M5459 Other low back pain: Secondary | ICD-10-CM | POA: Insufficient documentation

## 2024-06-02 NOTE — Therapy (Signed)
 OUTPATIENT PHYSICAL THERAPY DAILY NOTE  Patient Name: Andrew [name] MRN: [id number] DOB:[birth year], 89 y.o., male Today's Date: 06/02/2024   PT End of Session - 06/02/24 1016     Visit Number 3    Number of Visits --   1-2x/week   Date for PT Re-Evaluation 07/08/24    Authorization Type [employer] MCR - LEFS    Progress Note Due on Visit 10    PT Start Time 1015    PT Stop Time 1056    PT Time Calculation (min) 41 min          Past Medical History:  Diagnosis Date   Chronic kidney disease    CKD stg 3b   Colonic polyp 2011 last colo   colo q 80yr - [facility]    Coronary artery disease 02/2009   a. 02/2009 Cath: severe left main and three-vessel-->CABG; b. 12/2016 MVL EF 49%, no isch/infarct.   DEGENERATIVE JOINT DISEASE    Diastolic dysfunction    a. 10/2021 Echo: EF 55-60%, no rwma, mod conc LVH, GrI DD, mildly reduced RV fxn, RVSP 26.8 mmHg. mild AI, and aortic sclerosis.   Diverticulosis of colon    DIVERTICULOSIS, COLON    GERD (gastroesophageal reflux disease)    History of kidney stones    HYPERLIPIDEMIA-MIXED    HYPERTENSION, BENIGN    Interstitial lung disease (HCC)    LBP (low back pain)    s/p decompression 8/14; ESI - [place]; RFA x 3 early 2015   Pacemaker    a. 05/2017: St. Jude (serial number [id number]) pacemaker for CHB   Symptomatic bradycardia    a. s/p St. Jude (serial number [id number]) pacemaker 06/16/17   Type II or unspecified type diabetes mellitus without mention of complication, not stated as uncontrolled    VITAMIN D  DEFICIENCY    Past Surgical History:  Procedure Laterality Date   CARPAL TUNNEL RELEASE Right    CATARACT EXTRACTION, BILATERAL     R 07/07/13, L 07/21/13   CORONARY ARTERY BYPASS GRAFT  03/17/09   x 4   CYSTOSCOPY WITH STENT PLACEMENT Left 05/10/2021   Procedure: CYSTOSCOPY WITH LEFT  STENT PLACEMENT,LEFT RETROGRADE PYELOGRAM;  Surgeon: Cam Morene ORN, MD;  Location: [facility];  Service: Urology;  Laterality: Left;    CYSTOSCOPY/URETEROSCOPY/HOLMIUM LASER/STENT PLACEMENT Left 06/03/2021   Procedure: CYSTOSCOPY, LEFT RETROGRADE, URETEROSCOPY,HOLMIUM LASER,STENT EXCHANGE;  Surgeon: Cam Morene ORN, MD;  Location: [facility];  Service: Urology;  Laterality: Left;   GLUTEUS MINIMUS REPAIR Right 05/06/2024   Procedure: REPAIR, TENDON, GLUTEUS MINIMUS;  Surgeon: Genelle Standing, MD;  Location: [facility];  Service: Orthopedics;  Laterality: Right;  RIGHT GLUTEUS MAXIMUS TENDON TRANSFER   INGUINAL HERNIA REPAIR  1980   KNEE ARTHROSCOPY  2007   right   LUMBAR EPIDURAL INJECTION  2013   Has as needed.   LUMBAR LAMINECTOMY/DECOMPRESSION MICRODISCECTOMY N/A 06/19/2013   Procedure: LUMBAR LAMINECTOMY/DECOMPRESSION MICRODISCECTOMY;  Surgeon: Oneil Rodgers Priestly, MD;  Location: [facility];  Service: Orthopedics;  Laterality: N/A;  Lumbar 4-5 decompression   PACEMAKER IMPLANT N/A 06/16/2017   Procedure: Pacemaker Implant;  Surgeon: Waddell Danelle ORN, MD;  Location: [facility] INVASIVE CV LAB;  Service: Cardiovascular;  Laterality: N/A;   TRIGGER FINGER RELEASE Right    Patient Active Problem List   Diagnosis Date Noted   Weight loss 04/01/2024   Tear of right gluteus minimus tendon 12/21/2023   Anemia 04/02/2023   Hematoma 03/18/2023   Supratherapeutic INR 03/18/2023   Pulmonary fibrosis (HCC) 04/02/2022  Poor balance 12/28/2021   Bilateral leg weakness 04/01/2021   CKD (chronic kidney disease) stage 3, GFR 30-59 ml/min (HCC) 03/31/2021   Pacemaker 07/21/2019   History of deep venous thrombosis (DVT) of distal vein of left lower extremity 08/28/2017   Complete heart block (HCC) - PPM 06/15/2017   Wears hearing aid 09/25/2016   Degeneration of lumbar or lumbosacral intervertebral disc 03/23/2015   Vitamin D  deficiency 11/03/2010   Diverticulosis of colon 11/03/2010   Osteoarthritis (arthritis due to wear and tear of joints) 11/03/2010   Dyslipidemia, goal LDL below 70 12/01/2009   CAD, ARTERY BYPASS GRAFT  12/01/2009   DOE (dyspnea on exertion) 12/01/2009   Type 2 diabetes mellitus with neurological manifestations, controlled (HCC)     PCP: Geofm Glade PARAS, MD  REFERRING PROVIDER: Genelle Standing, MD  THERAPY DIAG:  Pain in right hip  Muscle weakness  Unsteadiness on feet  REFERRING DIAG: Tear of right gluteus minimus tendon, initial encounter [S76.011A]   Rationale for Evaluation and Treatment:  Rehabilitation  SUBJECTIVE:  PERTINENT PAST HISTORY:  Right hip gluteus maximus tendon transfer and Right hip open trochanteric bursectomy 7/15 for glute med repair, DM, CAD with bypass 2017, DOE, laminectomy, pacemaker, hx of DVT, blood thinner       PRECAUTIONS: see protocol below  WEIGHT BEARING RESTRICTIONS   WBAT per OP Note  OP Date: 05/06/2024  2 weeks 05/20/2024  4 weeks 06/03/2024  6 weeks 06/17/2024  8 weeks 07/01/2024  10 weeks 07/15/2024  12 weeks 07/29/2024   Glute med protocol:   [email or web address]  FALLS:  Has patient fallen in last 6 months? No, Number of falls: 0  MOI/History of condition:  Onset date: 7/15  SUBJECTIVE STATEMENT  06/02/2024: Pt reports that about 1 week ago he had a fall in his bedroom when trying to reach the phone.  He went down on his R knee and supported himself with his L hand on the bed.  His R knee and L great toe are sore following this but feels like the hip has continued to improve with no major set back.  His knee pain is improving and he feels like this is a bruise moderate discomfort.  EVAL: Andrew [name] is a 89 y.o. male who presents to clinic with chief complaint of chronic R hip pain and stiffness.  Well known to this PT and clinic.  Chronic glute min/med tear which was was repaired on 7/15 using glute max tendon transfer.  He has had quite a bit of pain since the surgery but this  is improving.  1 dressing change with no current drainage.  Has son at home who has been helpful.   Red flags:  denies malaise, erythema / streaking, and chills / night sweats  Pain:  Are you having pain? Yes Pain location: R hip pain NPRS scale:  Best: 0/10, Worst: 5/10 Aggravating factors: walking, standing  Relieving factors: rest, sitting Pain description: intermittent  Occupation: retired   Education administrator: [employer]   Hand Dominance: NA  Patient Goals/Specific Activities: reduce pain, improve ability to complete ADLs   OBJECTIVE:   GENERAL OBSERVATION/GAIT: Antalgic gait with reduced time in stance on R  LE MMT:  MMT Right (Eval) Left (Eval)  Hip flexion (L2, L3)    Knee extension (L3)    Knee flexion    Hip abduction    Hip extension    Hip external rotation    Hip internal rotation    Hip adduction  Ankle dorsiflexion (L4)    Ankle plantarflexion (S1)    Ankle inversion    Ankle eversion    Great Toe ext (L5)    Grossly     (Blank rows = not tested, score listed is out of 5 possible points.  N = WNL, D = diminished, C = clear for gross weakness with myotome testing, * = concordant pain with testing)  LE ROM:  ROM Right (Eval) Left (Eval)  Hip flexion 90 (stopped per protocol)    Hip extension    Hip abduction    Hip adduction    Hip internal rotation    Hip external rotation    Knee extension    Knee flexion    Ankle dorsiflexion    Ankle plantarflexion    Ankle inversion    Ankle eversion     (Blank rows = not tested, N = WNL, * = concordant pain with testing)  Functional Tests  Eval    6 MWT: unable to participate d/t pain                                                          PATIENT SURVEYS:  LEFS: 16/80   TODAY'S TREATMENT:  [facility] Adult PT Treatment  05/19/2024:  Therapeutic Exercise: Bike - 10 min Hip adduction squeeze - 5'' hold - 2x10 SAQ - 5'' - 2x10 - 5# HS curl - GTB   Neuromuscular re-ed: Glute  setting - with small lift  - 3x10 - no pain Isometric HL clam - no pain - sub max Sub max isometric hip flexion into ball -   Manual Therapy: PROM to 90 degrees of flexion PROM abd    HOME EXERCISE PROGRAM: Access Code: [email or web address] URL: [email or web address] Date: 05/13/2024 Prepared by: Helene Gasmen  Exercises - Supine Gluteal Sets  - 1-2 x daily - 7 x weekly - 2 sets - 10 reps - 5'' hold - Supine Hip Adduction Isometric with Ball  - 1-2 x daily - 7 x weekly - 2 sets - 10 reps - 5'' hold - Supine Heel Slide  - 1-2 x daily - 7 x weekly - 2 sets - 10 reps  Treatment priorities   Eval                                                  ASSESSMENT:  CLINICAL IMPRESSION:  06/02/2024: Andrew [name] tolerated session well with no adverse reaction.  Pt fell about 1 week ago with no reported change in hip sxs.  He does have some mild bruising on the R knee with associated discomfort but no gross deformity or difficulty bearing weight.  R hip continues to improve with max pain rating only 5/10 today for R hip.  Plan on updating HEP next visit after moving into next phase of protocol.   Andrew [name] is a 89 y.o. male who presents to clinic with signs and sxs consistent with R hip pain following glute min repair on 7/15.   Still having high pain levels but only about 1 week out at this point.  Started with some sub max isometrics and gentle ROM.  Did not complete strength testing d/t  precautions and pain.   Andrew [name] will benefit from skilled PT to address relevant deficits and improve ability to complete daily tasks such as walking, standing, and bending in order to care for himself and his wife.   OBJECTIVE IMPAIRMENTS: Pain, hip ROM, hip strength, gait, balance  ACTIVITY LIMITATIONS: walking, standing, bending, limiting, squatting  PERSONAL FACTORS: See medical history and pertinent history   REHAB POTENTIAL: Good  CLINICAL DECISION MAKING: Evolving/moderate complexity  EVALUATION  COMPLEXITY: Moderate   GOALS:   SHORT TERM GOALS: Target date: 06/10/2024   Andrew [name] will be >75% HEP compliant to improve carryover between sessions and facilitate independent management of condition  Evaluation: ongoing Goal status: MET   LONG TERM GOALS: Target date: 07/08/2024   Andrew [name] will self report >/= 50% decrease in pain from evaluation to improve function in daily tasks  Evaluation/Baseline: 8/10 max pain Goal status: INITIAL   2.  Andrew [name] will show a >/= 27 pt improvement in LEFS score (MCID is ~11% or 9 pts) as a proxy for functional improvement   Evaluation/Baseline: 16/80 pts Goal status: INITIAL   3.  Andrew [name] will be able to complete household tasks including caring for his wife, not limited by pain  Evaluation/Baseline: limited Goal status: INITIAL   4.  Andrew [name] will improve 6 minute walk test to age adjusted norm (MCID 50 m).  Evaluation/Baseline: not tested Goal status: INITIAL     5.  Andrew [name] will improve 30'' STS (MCID 2) to >/= 10x (w/ UE?: N) to show improved LE strength and improved transfers   Evaluation/Baseline: 10 tested Goal status: INITIAL   PLAN: PT FREQUENCY: 1-2x/week  PT DURATION: 8 weeks  PLANNED INTERVENTIONS:  [id number]- PT Re-evaluation, 97110-Therapeutic exercises, 97530- Therapeutic activity, [id number]- Neuromuscular re-education, 97535- Self Care, 02859- Manual therapy, [id number]- Gait training, [id number]- Aquatic Therapy, [phone]- Electrical stimulation (manual), [id number]- Vasopneumatic device, [id number]- Traction (mechanical), [id number]- Ionotophoresis 4mg /ml Dexamethasone , Taping, Dry Needling, Joint manipulation, and Spinal manipulation.   Helene Gasmen PT, DPT 06/02/2024, 11:00 AM  Name: Andrew [name]  MRN: [id number] Please request 2x/week for 12 weeks.    Check all possible CPT codes: 02889- Therapeutic Exercise, [phone]- Neuro Re-education, [phone] - Gait Training, [phone] - Manual Therapy, 97530 - Therapeutic Activities, 97535 - Self Care,  [id number] - Re-evaluation, [id number] - Mechanical traction, and [id number] - Aquatic therapy   Thank you!  MCD - Secure   Date of referral: 6/26 Referring provider: Genelle Referring diagnosis? Tear of right gluteus minimus tendon, initial encounter [S76.011A]  Treatment diagnosis? (if different than referring diagnosis) NA  What was this (referring dx) caused by? Surgery (Type: glute med repair)  Lysle of Condition: Initial Onset (within last 3 months)   Laterality: Rt  Current Functional Measure Score: LEFS 16/80  Objective measurements identify impairments when they are compared to normal values, the uninvolved extremity, and prior level of function.  [x]  Yes  []  No  Objective assessment of functional ability: Severe functional limitations   Briefly describe symptoms: difficulty in walking and daily tasks including self care and care for his wife  How did symptoms start: surgery  Average pain intensity:  Last 24 hours: 8  Past week: 8  How often does the pt experience symptoms? Frequently  How much have the symptoms interfered with usual daily activities? Extremely  How has condition changed since care began at this facility? NA - initial visit  In general, how is the patients overall health? Good   BACK PAIN (STarT Back Screening Tool) No

## 2024-06-09 ENCOUNTER — Encounter: Payer: Self-pay | Admitting: Physical Therapy

## 2024-06-09 ENCOUNTER — Ambulatory Visit: Admitting: Physical Therapy

## 2024-06-09 DIAGNOSIS — M25551 Pain in right hip: Secondary | ICD-10-CM

## 2024-06-09 DIAGNOSIS — M6281 Muscle weakness (generalized): Secondary | ICD-10-CM

## 2024-06-09 NOTE — Therapy (Signed)
 OUTPATIENT PHYSICAL THERAPY DAILY NOTE  Patient Name: Andrew [name] MRN: [id number] DOB:April 23, 1934, 89 y.o., male Today's Date: 06/09/2024   [name] End of Session - 06/09/24 0933     Visit Number 4    Number of Visits --   1-2x/week   Date for [name] Re-Evaluation 07/08/24    Authorization Type [employer] MCR - LEFS    Progress Note Due on Visit 10    [name] Start Time 0932    [name] Stop Time 1015    [name] Time Calculation (min) 43 min          Past Medical History:  Diagnosis Date   Chronic kidney disease    CKD stg 3b   Colonic polyp 2011 last colo   colo q 73yr - [facility]    Coronary artery disease 02/2009   a. 02/2009 Cath: severe left main and three-vessel-->CABG; b. 12/2016 MVL EF 49%, no isch/infarct.   DEGENERATIVE JOINT DISEASE    Diastolic dysfunction    a. 10/2021 Echo: EF 55-60%, no rwma, mod conc LVH, GrI DD, mildly reduced RV fxn, RVSP 26.8 mmHg. mild AI, and aortic sclerosis.   Diverticulosis of colon    DIVERTICULOSIS, COLON    GERD (gastroesophageal reflux disease)    History of kidney stones    HYPERLIPIDEMIA-MIXED    HYPERTENSION, BENIGN    Interstitial lung disease (HCC)    LBP (low back pain)    s/p decompression 8/14; ESI - [place]; RFA x 3 early 2015   Pacemaker    a. 05/2017: St. Jude (serial number [id number]) pacemaker for CHB   Symptomatic bradycardia    a. s/p St. Jude (serial number [id number]) pacemaker 06/16/17   Type II or unspecified type diabetes mellitus without mention of complication, not stated as uncontrolled    VITAMIN D  DEFICIENCY    Past Surgical History:  Procedure Laterality Date   CARPAL TUNNEL RELEASE Right    CATARACT EXTRACTION, BILATERAL     R 07/07/13, L 07/21/13   CORONARY ARTERY BYPASS GRAFT  03/17/09   x 4   CYSTOSCOPY WITH STENT PLACEMENT Left 05/10/2021   Procedure: CYSTOSCOPY WITH LEFT  STENT PLACEMENT,LEFT RETROGRADE PYELOGRAM;  Surgeon: Cam Morene ORN, MD;  Location: [facility];  Service: Urology;  Laterality: Left;    CYSTOSCOPY/URETEROSCOPY/HOLMIUM LASER/STENT PLACEMENT Left 06/03/2021   Procedure: CYSTOSCOPY, LEFT RETROGRADE, URETEROSCOPY,HOLMIUM LASER,STENT EXCHANGE;  Surgeon: Cam Morene ORN, MD;  Location: [facility];  Service: Urology;  Laterality: Left;   GLUTEUS MINIMUS REPAIR Right 05/06/2024   Procedure: REPAIR, TENDON, GLUTEUS MINIMUS;  Surgeon: Genelle Standing, MD;  Location: [facility];  Service: Orthopedics;  Laterality: Right;  RIGHT GLUTEUS MAXIMUS TENDON TRANSFER   INGUINAL HERNIA REPAIR  1980   KNEE ARTHROSCOPY  2007   right   LUMBAR EPIDURAL INJECTION  2013   Has as needed.   LUMBAR LAMINECTOMY/DECOMPRESSION MICRODISCECTOMY N/A 06/19/2013   Procedure: LUMBAR LAMINECTOMY/DECOMPRESSION MICRODISCECTOMY;  Surgeon: Oneil Rodgers Priestly, MD;  Location: [facility] OR;  Service: Orthopedics;  Laterality: N/A;  Lumbar 4-5 decompression   PACEMAKER IMPLANT N/A 06/16/2017   Procedure: Pacemaker Implant;  Surgeon: Waddell Danelle ORN, MD;  Location: [facility] INVASIVE CV LAB;  Service: Cardiovascular;  Laterality: N/A;   TRIGGER FINGER RELEASE Right    Patient Active Problem List   Diagnosis Date Noted   Weight loss 04/01/2024   Tear of right gluteus minimus tendon 12/21/2023   Anemia 04/02/2023   Hematoma 03/18/2023   Supratherapeutic INR 03/18/2023   Pulmonary fibrosis (HCC) 04/02/2022  Poor balance 12/28/2021   Bilateral leg weakness 04/01/2021   CKD (chronic kidney disease) stage 3, GFR 30-59 ml/min (HCC) 03/31/2021   Pacemaker 07/21/2019   History of deep venous thrombosis (DVT) of distal vein of left lower extremity 08/28/2017   Complete heart block (HCC) - PPM 06/15/2017   Wears hearing aid 09/25/2016   Degeneration of lumbar or lumbosacral intervertebral disc 03/23/2015   Vitamin D  deficiency 11/03/2010   Diverticulosis of colon 11/03/2010   Osteoarthritis (arthritis due to wear and tear of joints) 11/03/2010   Dyslipidemia, goal LDL below 70 12/01/2009   CAD, ARTERY BYPASS GRAFT  12/01/2009   DOE (dyspnea on exertion) 12/01/2009   Type 2 diabetes mellitus with neurological manifestations, controlled (HCC)     PCP: Geofm Glade PARAS, MD  REFERRING PROVIDER: Genelle Standing, MD  THERAPY DIAG:  Pain in right hip  Muscle weakness  REFERRING DIAG: Tear of right gluteus minimus tendon, initial encounter [S76.011A]   Rationale for Evaluation and Treatment:  Rehabilitation  SUBJECTIVE:  PERTINENT PAST HISTORY:  Right hip gluteus maximus tendon transfer and Right hip open trochanteric bursectomy 7/15 for glute med repair, DM, CAD with bypass 2017, DOE, laminectomy, pacemaker, hx of DVT, blood thinner       PRECAUTIONS: see protocol below  WEIGHT BEARING RESTRICTIONS   WBAT per OP Note  OP Date: 05/06/2024  2 weeks 05/20/2024  4 weeks 06/03/2024  6 weeks 06/17/2024  8 weeks 07/01/2024  10 weeks 07/15/2024  12 weeks 07/29/2024   Glute med protocol:   [email or web address]  FALLS:  Has patient fallen in last 6 months? No, Number of falls: 0  MOI/History of condition:  Onset date: 7/15  SUBJECTIVE STATEMENT  06/09/2024: [name] reports 3-4/10 pain and min compliance with HEP.    [name] reports that about 1 week ago he had a fall in his bedroom when trying to reach the phone.  He went down on his R knee and supported himself with his L hand on the bed.  His R knee and L great toe are sore following this but feels like the hip has continued to improve with no major set back.  His knee pain is improving and he feels like this is a bruise moderate discomfort.  EVAL: Andrew [name] is a 89 y.o. male who presents to clinic with chief complaint of chronic R hip pain and stiffness.  Well known to this [name] and clinic.  Chronic glute min/med tear which was was repaired on 7/15 using glute max tendon transfer.  He has had quite a bit  of pain since the surgery but this is improving.  1 dressing change with no current drainage.  Has son at home who has been helpful.   Red flags:  denies malaise, erythema / streaking, and chills / night sweats  Pain:  Are you having pain? Yes Pain location: R hip pain NPRS scale: 3-4/10 Best: 0/10, Worst: 5/10 Aggravating factors: walking, standing  Relieving factors: rest, sitting Pain description: intermittent  Occupation: retired   Education administrator: [employer]   Hand Dominance: NA  Patient Goals/Specific Activities: reduce pain, improve ability to complete ADLs   OBJECTIVE:   GENERAL OBSERVATION/GAIT: Antalgic gait with reduced time in stance on R  LE MMT:  MMT Right (Eval) Left (Eval)  Hip flexion (L2, L3)    Knee extension (L3)    Knee flexion    Hip abduction    Hip extension    Hip external rotation    Hip internal  rotation    Hip adduction    Ankle dorsiflexion (L4)    Ankle plantarflexion (S1)    Ankle inversion    Ankle eversion    Great Toe ext (L5)    Grossly     (Blank rows = not tested, score listed is out of 5 possible points.  N = WNL, D = diminished, C = clear for gross weakness with myotome testing, * = concordant pain with testing)  LE ROM:  ROM Right (Eval) Left (Eval) Right 06/09/24  Hip flexion 90 (stopped per protocol)   Easily to 100 PROM  Hip extension     Hip abduction     Hip adduction     Hip internal rotation     Hip external rotation     Knee extension     Knee flexion     Ankle dorsiflexion     Ankle plantarflexion     Ankle inversion     Ankle eversion      (Blank rows = not tested, N = WNL, * = concordant pain with testing)  Functional Tests  Eval    6 MWT: unable to participate d/t pain                                                          PATIENT SURVEYS:  LEFS: 16/80   TODAY'S TREATMENT: [facility] Adult [name] Treatment:                                                DATE: 05/23/24 Therapeutic  Exercise: Bike 10 min L1 LAQ 10 x 2  Seated Hamstring curl GTB 10 x 2   Seated ball squeeze 3 sec 10 x 2  PROM hip flexion, PROM hip abduction Bridge x 10  Supine march  Supine with legs over 2 pillows, belt at thighs: Isometric hip abduct 3 Sec x 10  Updated HEP      [facility] Adult [name] Treatment  05/19/2024:  Therapeutic Exercise: Bike - 10 min Hip adduction squeeze - 5'' hold - 2x10 SAQ - 5'' - 2x10 - 5# HS curl - GTB   Neuromuscular re-ed: Glute setting - with small lift  - 3x10 - no pain Isometric HL clam - no pain - sub max Sub max isometric hip flexion into ball -   Manual Therapy: PROM to 90 degrees of flexion PROM abd    HOME EXERCISE PROGRAM: Access Code: [email or web address] URL: [email or web address] Date: 05/13/2024 Prepared by: Helene Gasmen  Exercises - Supine Gluteal Sets  - 1-2 x daily - 7 x weekly - 2 sets - 10 reps - 5'' hold - Supine Hip Adduction Isometric with Ball  - 1-2 x daily - 7 x weekly - 2 sets - 10 reps - 5'' hold - Supine Heel Slide  - 1-2 x daily - 7 x weekly - 2 sets - 10 reps   Treatment priorities   Eval  ASSESSMENT:  CLINICAL IMPRESSION:  06/09/2024: Andrew [name] reports min compliance with HEP. Continued Bike as he does not have access to this from home. Able to progress per protocol with increased exercise demand and increased hip flexion ROM. He demonstrates improved hip flexion PROM. Updated HEP. No increased pain reported.   Andrew [name] is a 89 y.o. male who presents to clinic with signs and sxs consistent with R hip pain following glute min repair on 7/15.   Still having high pain levels but only about 1 week out at this point.  Started with some sub max isometrics and gentle ROM.  Did not complete strength testing d/t precautions and pain.   Andrew [name] to address relevant deficits and improve ability to complete daily tasks such as walking, standing, and  bending in order to care for himself and his wife.   OBJECTIVE IMPAIRMENTS: Pain, hip ROM, hip strength, gait, balance  ACTIVITY LIMITATIONS: walking, standing, bending, limiting, squatting  PERSONAL FACTORS: See medical history and pertinent history   REHAB POTENTIAL: Good  CLINICAL DECISION MAKING: Evolving/moderate complexity  EVALUATION COMPLEXITY: Moderate   GOALS:   SHORT TERM GOALS: Target date: 06/10/2024   Tymothy will be >75% HEP compliant to improve carryover between sessions and facilitate independent management of condition  Evaluation: ongoing Goal status: MET   LONG TERM GOALS: Target date: 07/08/2024   Jermine will self report >/= 50% decrease in pain from evaluation to improve function in daily tasks  Evaluation/Baseline: 8/10 max pain Goal status: INITIAL   2.  Estus will show a >/= 27 [name] improvement in LEFS score (MCID is ~11% or 9 pts) as a proxy for functional improvement   Evaluation/Baseline: 16/80 pts Goal status: INITIAL   3.  Lancer will be able to complete household tasks including caring for his wife, not limited by pain  Evaluation/Baseline: limited Goal status: INITIAL   4.  Keyen will improve 6 minute walk test to age adjusted norm (MCID 50 m).  Evaluation/Baseline: not tested Goal status: INITIAL     5.  Demarkis will improve 30'' STS (MCID 2) to >/= 10x (w/ UE?: N) to show improved LE strength and improved transfers   Evaluation/Baseline: 10 tested Goal status: INITIAL   PLAN: [name] FREQUENCY: 1-2x/week  [name] DURATION: 8 weeks  PLANNED INTERVENTIONS:  [id number]- [name] Re-evaluation, 97110-Therapeutic exercises, 97530- Therapeutic activity, [id number]- Neuromuscular re-education, 97535- Self Care, 02859- Manual therapy, [id number]- Gait training, [id number]- Aquatic Therapy, [phone]- Electrical stimulation (manual), [id number]- Vasopneumatic device, [id number]- Traction (mechanical), [id number]- Ionotophoresis 4mg /ml Dexamethasone , Taping, Dry Needling, Joint  manipulation, and Spinal manipulation.   Harlene Persons, PTA 06/09/24 10:17 AM Phone: [phone] Fax: [phone]   Name: Klinton Candelas  MRN: [id number] Please request 2x/week for 12 weeks.    Check all possible CPT codes: 02889- Therapeutic Exercise, [phone]- Neuro Re-education, [phone] - Gait Training, [phone] - Manual Therapy, 97530 - Therapeutic Activities, 97535 - Self Care, [phone] - Re-evaluation, [id number] - Mechanical traction, and [id number] - Aquatic therapy   Thank you!  MCD - Secure   Date of referral: 6/26 Referring provider: Genelle Referring diagnosis? Tear of right gluteus minimus tendon, initial encounter [S76.011A]  Treatment diagnosis? (if different than referring diagnosis) NA  What was this (referring dx) caused by? Surgery (Type: glute med repair)  Lysle of Condition: Initial Onset (within last 3 months)   Laterality: Rt  Current Functional Measure Score: LEFS 16/80  Objective measurements identify impairments when they are compared to normal values, the uninvolved extremity,  and prior level of function.  [x]  Yes  []  No  Objective assessment of functional ability: Severe functional limitations   Briefly describe symptoms: difficulty in walking and daily tasks including self care and care for his wife  How did symptoms start: surgery  Average pain intensity:  Last 24 hours: 8  Past week: 8  How often does the [name] experience symptoms? Frequently  How much have the symptoms interfered with usual daily activities? Extremely  How has condition changed since care began at this facility? NA - initial visit  In general, how is the patients overall health? Good   BACK PAIN (STarT Back Screening Tool) No

## 2024-06-11 ENCOUNTER — Ambulatory Visit (INDEPENDENT_AMBULATORY_CARE_PROVIDER_SITE_OTHER): Payer: Medicare Other

## 2024-06-11 ENCOUNTER — Ambulatory Visit: Attending: Internal Medicine

## 2024-06-11 DIAGNOSIS — Z5181 Encounter for therapeutic drug level monitoring: Secondary | ICD-10-CM | POA: Diagnosis not present

## 2024-06-11 DIAGNOSIS — I442 Atrioventricular block, complete: Secondary | ICD-10-CM

## 2024-06-11 DIAGNOSIS — I82409 Acute embolism and thrombosis of unspecified deep veins of unspecified lower extremity: Secondary | ICD-10-CM

## 2024-06-11 LAB — POCT INR: INR: 2 (ref 2.0–3.0)

## 2024-06-11 NOTE — Patient Instructions (Signed)
 Take 1 tablet Daily, except 2 tablets every Monday and Friday.    INR in 8 weeks.  [phone]

## 2024-06-12 ENCOUNTER — Encounter: Payer: Self-pay | Admitting: Physical Therapy

## 2024-06-12 ENCOUNTER — Ambulatory Visit: Admitting: Physical Therapy

## 2024-06-12 DIAGNOSIS — M6281 Muscle weakness (generalized): Secondary | ICD-10-CM

## 2024-06-12 DIAGNOSIS — R2681 Unsteadiness on feet: Secondary | ICD-10-CM

## 2024-06-12 DIAGNOSIS — M5459 Other low back pain: Secondary | ICD-10-CM

## 2024-06-12 DIAGNOSIS — M25551 Pain in right hip: Secondary | ICD-10-CM | POA: Diagnosis not present

## 2024-06-12 LAB — CUP PACEART REMOTE DEVICE CHECK
Battery Remaining Longevity: 44 mo
Battery Remaining Percentage: 36 %
Battery Voltage: 2.95 V
Brady Statistic AP VP Percent: 59 %
Brady Statistic AP VS Percent: 1 %
Brady Statistic AS VP Percent: 41 %
Brady Statistic AS VS Percent: 1 %
Brady Statistic RA Percent Paced: 58 %
Brady Statistic RV Percent Paced: 99 %
Date Time Interrogation Session: 20250821032518
Implantable Lead Connection Status: 753985
Implantable Lead Connection Status: 753985
Implantable Lead Implant Date: 20180825
Implantable Lead Implant Date: 20180825
Implantable Lead Location: 753859
Implantable Lead Location: 753860
Implantable Pulse Generator Implant Date: 20180825
Lead Channel Impedance Value: 460 Ohm
Lead Channel Impedance Value: 600 Ohm
Lead Channel Pacing Threshold Amplitude: 0.5 V
Lead Channel Pacing Threshold Amplitude: 0.5 V
Lead Channel Pacing Threshold Pulse Width: 0.5 ms
Lead Channel Pacing Threshold Pulse Width: 0.5 ms
Lead Channel Sensing Intrinsic Amplitude: 4.6 mV
Lead Channel Sensing Intrinsic Amplitude: 5 mV
Lead Channel Setting Pacing Amplitude: 0.75 V
Lead Channel Setting Pacing Amplitude: 2 V
Lead Channel Setting Pacing Pulse Width: 0.5 ms
Lead Channel Setting Sensing Sensitivity: 4 mV
Pulse Gen Model: 2272
Pulse Gen Serial Number: 8939923

## 2024-06-12 NOTE — Therapy (Signed)
 OUTPATIENT PHYSICAL THERAPY DAILY NOTE  Patient Name: Andrew [name] MRN: [id number] DOB:November 30, 1933, 89 y.o., male Today's Date: 06/12/2024   PT End of Session - 06/12/24 0930     Visit Number 5    Number of Visits --   1-2x/week   Date for PT Re-Evaluation 07/08/24    Authorization Type [employer] MCR - LEFS    Progress Note Due on Visit 10    PT Start Time 0930    PT Stop Time 1012    PT Time Calculation (min) 42 min          Past Medical History:  Diagnosis Date   Chronic kidney disease    CKD stg 3b   Colonic polyp 2011 last colo   colo q 62yr - [facility]    Coronary artery disease 02/2009   a. 02/2009 Cath: severe left main and three-vessel-->CABG; b. 12/2016 MVL EF 49%, no isch/infarct.   DEGENERATIVE JOINT DISEASE    Diastolic dysfunction    a. 10/2021 Echo: EF 55-60%, no rwma, mod conc LVH, GrI DD, mildly reduced RV fxn, RVSP 26.8 mmHg. mild AI, and aortic sclerosis.   Diverticulosis of colon    DIVERTICULOSIS, COLON    GERD (gastroesophageal reflux disease)    History of kidney stones    HYPERLIPIDEMIA-MIXED    HYPERTENSION, BENIGN    Interstitial lung disease (HCC)    LBP (low back pain)    s/p decompression 8/14; ESI - [place]; RFA x 3 early 2015   Pacemaker    a. 05/2017: St. Jude (serial number [id number]) pacemaker for CHB   Symptomatic bradycardia    a. s/p St. Jude (serial number [id number]) pacemaker 06/16/17   Type II or unspecified type diabetes mellitus without mention of complication, not stated as uncontrolled    VITAMIN D  DEFICIENCY    Past Surgical History:  Procedure Laterality Date   CARPAL TUNNEL RELEASE Right    CATARACT EXTRACTION, BILATERAL     R 07/07/13, L 07/21/13   CORONARY ARTERY BYPASS GRAFT  03/17/09   x 4   CYSTOSCOPY WITH STENT PLACEMENT Left 05/10/2021   Procedure: CYSTOSCOPY WITH LEFT  STENT PLACEMENT,LEFT RETROGRADE PYELOGRAM;  Surgeon: Cam Morene ORN, MD;  Location: [facility];  Service: Urology;  Laterality: Left;    CYSTOSCOPY/URETEROSCOPY/HOLMIUM LASER/STENT PLACEMENT Left 06/03/2021   Procedure: CYSTOSCOPY, LEFT RETROGRADE, URETEROSCOPY,HOLMIUM LASER,STENT EXCHANGE;  Surgeon: Cam Morene ORN, MD;  Location: [facility];  Service: Urology;  Laterality: Left;   GLUTEUS MINIMUS REPAIR Right 05/06/2024   Procedure: REPAIR, TENDON, GLUTEUS MINIMUS;  Surgeon: Genelle Standing, MD;  Location: [facility];  Service: Orthopedics;  Laterality: Right;  RIGHT GLUTEUS MAXIMUS TENDON TRANSFER   INGUINAL HERNIA REPAIR  1980   KNEE ARTHROSCOPY  2007   right   LUMBAR EPIDURAL INJECTION  2013   Has as needed.   LUMBAR LAMINECTOMY/DECOMPRESSION MICRODISCECTOMY N/A 06/19/2013   Procedure: LUMBAR LAMINECTOMY/DECOMPRESSION MICRODISCECTOMY;  Surgeon: Oneil Rodgers Priestly, MD;  Location: [facility];  Service: Orthopedics;  Laterality: N/A;  Lumbar 4-5 decompression   PACEMAKER IMPLANT N/A 06/16/2017   Procedure: Pacemaker Implant;  Surgeon: Waddell Danelle ORN, MD;  Location: [facility] INVASIVE CV LAB;  Service: Cardiovascular;  Laterality: N/A;   TRIGGER FINGER RELEASE Right    Patient Active Problem List   Diagnosis Date Noted   Weight loss 04/01/2024   Tear of right gluteus minimus tendon 12/21/2023   Anemia 04/02/2023   Hematoma 03/18/2023   Supratherapeutic INR 03/18/2023   Pulmonary fibrosis (HCC) 04/02/2022  Poor balance 12/28/2021   Bilateral leg weakness 04/01/2021   CKD (chronic kidney disease) stage 3, GFR 30-59 ml/min (HCC) 03/31/2021   Pacemaker 07/21/2019   History of deep venous thrombosis (DVT) of distal vein of left lower extremity 08/28/2017   Complete heart block (HCC) - PPM 06/15/2017   Wears hearing aid 09/25/2016   Degeneration of lumbar or lumbosacral intervertebral disc 03/23/2015   Vitamin D  deficiency 11/03/2010   Diverticulosis of colon 11/03/2010   Osteoarthritis (arthritis due to wear and tear of joints) 11/03/2010   Dyslipidemia, goal LDL below 70 12/01/2009   CAD, ARTERY BYPASS GRAFT  12/01/2009   DOE (dyspnea on exertion) 12/01/2009   Type 2 diabetes mellitus with neurological manifestations, controlled (HCC)     PCP: Geofm Glade PARAS, MD  REFERRING PROVIDER: Genelle Standing, MD  THERAPY DIAG:  Pain in right hip  Muscle weakness  Unsteadiness on feet  Other low back pain  REFERRING DIAG: Tear of right gluteus minimus tendon, initial encounter [S76.011A]   Rationale for Evaluation and Treatment:  Rehabilitation  SUBJECTIVE:  PERTINENT PAST HISTORY:  Right hip gluteus maximus tendon transfer and Right hip open trochanteric bursectomy 7/15 for glute med repair, DM, CAD with bypass 2017, DOE, laminectomy, pacemaker, hx of DVT, blood thinner       PRECAUTIONS: see protocol below  WEIGHT BEARING RESTRICTIONS   WBAT per OP Note  OP Date: 05/06/2024  2 weeks 05/20/2024  4 weeks 06/03/2024  6 weeks 06/17/2024  8 weeks 07/01/2024  10 weeks 07/15/2024  12 weeks 07/29/2024   Glute med protocol:   [email or web address]  FALLS:  Has patient fallen in last 6 months? No, Number of falls: 0  MOI/History of condition:  Onset date: 7/15  SUBJECTIVE STATEMENT  06/12/2024: PT reports that he is having some increased low back pain over the last week.  He feels his surgery is doing well and is not painful.  EVAL: Andrew [name] is a 89 y.o. male who presents to clinic with chief complaint of chronic R hip pain and stiffness.  Well known to this PT and clinic.  Chronic glute min/med tear which was was repaired on 7/15 using glute max tendon transfer.  He has had quite a bit of pain since the surgery but this is improving.  1 dressing change with no current drainage.  Has son at home who has been helpful.   Red flags:  denies malaise, erythema / streaking, and chills / night sweats  Pain:  Are you having pain? Yes Pain  location: R hip pain NPRS scale: 3-4/10 Best: 0/10, Worst: 5/10 Aggravating factors: walking, standing  Relieving factors: rest, sitting Pain description: intermittent  Occupation: retired   Education administrator: [employer]   Hand Dominance: NA  Patient Goals/Specific Activities: reduce pain, improve ability to complete ADLs   OBJECTIVE:   GENERAL OBSERVATION/GAIT: Antalgic gait with reduced time in stance on R  LE MMT:  MMT Right (Eval) Left (Eval)  Hip flexion (L2, L3)    Knee extension (L3)    Knee flexion    Hip abduction    Hip extension    Hip external rotation    Hip internal rotation    Hip adduction    Ankle dorsiflexion (L4)    Ankle plantarflexion (S1)    Ankle inversion    Ankle eversion    Great Toe ext (L5)    Grossly     (Blank rows = not tested, score listed is out of 5 possible points.  N = WNL, D = diminished, C = clear for gross weakness with myotome testing, * = concordant pain with testing)  LE ROM:  ROM Right (Eval) Left (Eval) Right 06/09/24  Hip flexion 90 (stopped per protocol)   Easily to 100 PROM  Hip extension     Hip abduction     Hip adduction     Hip internal rotation     Hip external rotation     Knee extension     Knee flexion     Ankle dorsiflexion     Ankle plantarflexion     Ankle inversion     Ankle eversion      (Blank rows = not tested, N = WNL, * = concordant pain with testing)  Functional Tests  Eval    6 MWT: unable to participate d/t pain                                                          PATIENT SURVEYS:  LEFS: 16/80   TODAY'S TREATMENT: [facility] Adult PT Treatment  06/12/2024:  Therapeutic Exercise: Bike - 10 min - L3 Hip adduction squeeze - 5'' hold - 2x10 Knee ext machine - bil 3x10 - 5# - no pain Knee flexion machine - bil 10# - 3x10 - no pain  Neuromuscular re-ed: Bridge with cues for glute activation Isometric HL clam - no pain - sub max March on foam Semi tandem on  foam  Manual Therapy: PROM to 100+ degrees of flexion      [facility] Adult PT Treatment  05/19/2024:  Therapeutic Exercise: Bike - 10 min Hip adduction squeeze - 5'' hold - 2x10 SAQ - 5'' - 2x10 - 5# HS curl - GTB   Neuromuscular re-ed: Glute setting - with small lift  - 3x10 - no pain Isometric HL clam - no pain - sub max Sub max isometric hip flexion into ball -   Manual Therapy: PROM to 90 degrees of flexion PROM abd    HOME EXERCISE PROGRAM: Access Code: [email or web address] URL: [email or web address] Date: 05/13/2024 Prepared by: Helene Gasmen  Exercises - Supine Gluteal Sets  - 1-2 x daily - 7 x weekly - 2 sets - 10 reps - 5'' hold - Supine Hip Adduction Isometric with Ball  - 1-2 x daily - 7 x weekly - 2 sets - 10 reps - 5'' hold - Supine Heel Slide  - 1-2 x daily - 7 x weekly - 2 sets - 10 reps   Treatment priorities   Eval                                                  ASSESSMENT:  CLINICAL IMPRESSION:  06/12/2024: Andrew [name] tolerated session well with no adverse reaction.  Progressing per protocol with no issue.  No pain reported.  Pt with significant L LE weakness today which has been ongoing per pt.  Encouraged him to contact MD or mention to spine specialist at next appt; confirms understanding.  Andrew [name] is a 89 y.o. male who presents to clinic with signs and sxs consistent with R hip pain following glute min repair on 7/15.   Still having high pain  levels but only about 1 week out at this point.  Started with some sub max isometrics and gentle ROM.  Did not complete strength testing d/t precautions and pain.   Andrew [name] will benefit from skilled PT to address relevant deficits and improve ability to complete daily tasks such as walking, standing, and bending in order to care for himself and his wife.   OBJECTIVE IMPAIRMENTS: Pain, hip ROM, hip strength, gait, balance  ACTIVITY LIMITATIONS: walking, standing, bending, limiting, squatting  PERSONAL  FACTORS: See medical history and pertinent history   REHAB POTENTIAL: Good  CLINICAL DECISION MAKING: Evolving/moderate complexity  EVALUATION COMPLEXITY: Moderate   GOALS:   SHORT TERM GOALS: Target date: 06/10/2024   Andrew [name] will be >75% HEP compliant to improve carryover between sessions and facilitate independent management of condition  Evaluation: ongoing Goal status: MET   LONG TERM GOALS: Target date: 07/08/2024   Andrew [name] will self report >/= 50% decrease in pain from evaluation to improve function in daily tasks  Evaluation/Baseline: 8/10 max pain Goal status: INITIAL   2.  Andrew [name] will show a >/= 27 pt improvement in LEFS score (MCID is ~11% or 9 pts) as a proxy for functional improvement   Evaluation/Baseline: 16/80 pts Goal status: INITIAL   3.  Andrew [name] will be able to complete household tasks including caring for his wife, not limited by pain  Evaluation/Baseline: limited Goal status: INITIAL   4.  Andrew [name] will improve 6 minute walk test to age adjusted norm (MCID 50 m).  Evaluation/Baseline: not tested Goal status: INITIAL     5.  Andrew [name] will improve 30'' STS (MCID 2) to >/= 10x (w/ UE?: N) to show improved LE strength and improved transfers   Evaluation/Baseline: 10 tested Goal status: INITIAL   PLAN: PT FREQUENCY: 1-2x/week  PT DURATION: 8 weeks  PLANNED INTERVENTIONS:  97164- PT Re-evaluation, 97110-Therapeutic exercises, 97530- Therapeutic activity, [id number]- Neuromuscular re-education, 97535- Self Care, 02859- Manual therapy, [id number]- Gait training, [id number]- Aquatic Therapy, [phone]- Electrical stimulation (manual), [id number]- Vasopneumatic device, [id number]- Traction (mechanical), [id number]- Ionotophoresis 4mg /ml Dexamethasone , Taping, Dry Needling, Joint manipulation, and Spinal manipulation.   Harlene Persons, PTA 06/12/24 10:15 AM Phone: [phone] Fax: [phone]   Name: Andrew [name]  MRN: [id number] Please request 2x/week for 12  weeks.    Check all possible CPT codes: 02889- Therapeutic Exercise, [phone]- Neuro Re-education, [phone] - Gait Training, [phone] - Manual Therapy, 97530 - Therapeutic Activities, 97535 - Self Care, [phone] - Re-evaluation, [id number] - Mechanical traction, and [id number] - Aquatic therapy   Thank you!  MCD - Secure   Date of referral: 6/26 Referring provider: Genelle Referring diagnosis? Tear of right gluteus minimus tendon, initial encounter [S76.011A]  Treatment diagnosis? (if different than referring diagnosis) NA  What was this (referring dx) caused by? Surgery (Type: glute med repair)  Lysle of Condition: Initial Onset (within last 3 months)   Laterality: Rt  Current Functional Measure Score: LEFS 16/80  Objective measurements identify impairments when they are compared to normal values, the uninvolved extremity, and prior level of function.  [x]  Yes  []  No  Objective assessment of functional ability: Severe functional limitations   Briefly describe symptoms: difficulty in walking and daily tasks including self care and care for his wife  How did symptoms start: surgery  Average pain intensity:  Last 24 hours: 8  Past week: 8  How often does the pt experience symptoms? Frequently  How much have the symptoms interfered with usual daily activities? Extremely  How has condition  changed since care began at this facility? NA - initial visit  In general, how is the patients overall health? Good   BACK PAIN (STarT Back Screening Tool) No

## 2024-06-15 ENCOUNTER — Ambulatory Visit: Payer: Self-pay | Admitting: Internal Medicine

## 2024-06-16 ENCOUNTER — Ambulatory Visit (HOSPITAL_BASED_OUTPATIENT_CLINIC_OR_DEPARTMENT_OTHER): Admitting: Physician Assistant

## 2024-06-16 DIAGNOSIS — S76011A Strain of muscle, fascia and tendon of right hip, initial encounter: Secondary | ICD-10-CM

## 2024-06-16 NOTE — Progress Notes (Signed)
 Post Operative Evaluation    Procedure/Date of Surgery: Right hip gluteus maximus tendon transfer 7/15  Interval History:   Presents 6 weeks status post above procedure.  Overall he is doing quite well.  Denies any hip pain. Reports sleeping on right side with no issues. States he has full right hip ROM. Reports mild decreased strength, but states is diffuse. Mentioned to his PCP and plans to have further workup at his next appointment in December. Using cane for ambulation assistance. Recent LLE DVT diagnosis. Currently managed on Coumadin . INR has been well-controlled within 1.7-2.0 range, next INR scheduled for eight weeks.   Patient does report very small recurrent scab along incision. Will fall off in shower and leak clear fluid.    PMH/PSH/Family History/Social History/Meds/Allergies:    Past Medical History:  Diagnosis Date   Chronic kidney disease    CKD stg 3b   Colonic polyp 2011 last colo   colo q 15yr - [facility]    Coronary artery disease 02/2009   a. 02/2009 Cath: severe left main and three-vessel-->CABG; b. 12/2016 MVL EF 49%, no isch/infarct.   DEGENERATIVE JOINT DISEASE    Diastolic dysfunction    a. 10/2021 Echo: EF 55-60%, no rwma, mod conc LVH, GrI DD, mildly reduced RV fxn, RVSP 26.8 mmHg. mild AI, and aortic sclerosis.   Diverticulosis of colon    DIVERTICULOSIS, COLON    GERD (gastroesophageal reflux disease)    History of kidney stones    HYPERLIPIDEMIA-MIXED    HYPERTENSION, BENIGN    Interstitial lung disease (HCC)    LBP (low back pain)    s/p decompression 8/14; ESI - [place]; RFA x 3 early 2015   Pacemaker    a. 05/2017: St. Jude (serial number [id number]) pacemaker for CHB   Symptomatic bradycardia    a. s/p St. Jude (serial number [id number]) pacemaker 06/16/17   Type II or unspecified type diabetes mellitus without mention of complication, not stated as uncontrolled    VITAMIN D  DEFICIENCY    Past Surgical History:   Procedure Laterality Date   CARPAL TUNNEL RELEASE Right    CATARACT EXTRACTION, BILATERAL     R 07/07/13, L 07/21/13   CORONARY ARTERY BYPASS GRAFT  03/17/09   x 4   CYSTOSCOPY WITH STENT PLACEMENT Left 05/10/2021   Procedure: CYSTOSCOPY WITH LEFT  STENT PLACEMENT,LEFT RETROGRADE PYELOGRAM;  Surgeon: Cam Morene ORN, MD;  Location: [facility];  Service: Urology;  Laterality: Left;   CYSTOSCOPY/URETEROSCOPY/HOLMIUM LASER/STENT PLACEMENT Left 06/03/2021   Procedure: CYSTOSCOPY, LEFT RETROGRADE, URETEROSCOPY,HOLMIUM LASER,STENT EXCHANGE;  Surgeon: Cam Morene ORN, MD;  Location: [facility];  Service: Urology;  Laterality: Left;   GLUTEUS MINIMUS REPAIR Right 05/06/2024   Procedure: REPAIR, TENDON, GLUTEUS MINIMUS;  Surgeon: Genelle Standing, MD;  Location: [facility];  Service: Orthopedics;  Laterality: Right;  RIGHT GLUTEUS MAXIMUS TENDON TRANSFER   INGUINAL HERNIA REPAIR  1980   KNEE ARTHROSCOPY  2007   right   LUMBAR EPIDURAL INJECTION  2013   Has as needed.   LUMBAR LAMINECTOMY/DECOMPRESSION MICRODISCECTOMY N/A 06/19/2013   Procedure: LUMBAR LAMINECTOMY/DECOMPRESSION MICRODISCECTOMY;  Surgeon: Oneil Rodgers Priestly, MD;  Location: [facility] OR;  Service: Orthopedics;  Laterality: N/A;  Lumbar 4-5 decompression   PACEMAKER IMPLANT N/A 06/16/2017   Procedure: Pacemaker Implant;  Surgeon: Waddell Danelle ORN, MD;  Location:  [facility] INVASIVE [facility] LAB;  Service: Cardiovascular;  Laterality: N/A;   TRIGGER FINGER RELEASE Right    Social History   Socioeconomic History   Marital status: Married    Spouse name: Darice   Number of children: 3   Years of education: Not on file   Highest education level: 12th grade  Occupational History   Not on file  Tobacco Use   Smoking status: Never   Smokeless tobacco: Never   Tobacco comments:    Married, 3 grown children. Retired 04/2002 from [employer] work  Psychologist, educational Use   Vaping status: Never Used  Substance and Sexual Activity   Alcohol use:  Yes    Comment: RARE   Drug use: No   Sexual activity: Not Currently  Other Topics Concern   Not on file  Social History Narrative   Lives with wife and 1 son.   Social Drivers of Corporate investment banker Strain: Low Risk  (12/18/2023)   Overall Financial Resource Strain (CARDIA)    Difficulty of Paying Living Expenses: Not hard at all  Food Insecurity: No Food Insecurity (12/18/2023)   Hunger Vital Sign    Worried About Running Out of Food in the Last Year: Never true    Ran Out of Food in the Last Year: Never true  Transportation Needs: No Transportation Needs (12/18/2023)   PRAPARE - Administrator, Civil Service (Medical): No    Lack of Transportation (Non-Medical): No  Physical Activity: Inactive (12/18/2023)   Exercise Vital Sign    Days of Exercise per Week: 0 days    Minutes of Exercise per Session: 20 min  Stress: No Stress Concern Present (12/18/2023)   [employer] of Occupational Health - Occupational Stress Questionnaire    Feeling of Stress : Not at all  Social Connections: Socially Isolated (12/18/2023)   Social Connection and Isolation Panel    Frequency of Communication with Friends and Family: Once a week    Frequency of Social Gatherings with Friends and Family: Once a week    Attends Religious Services: Never    Database administrator or Organizations: No    Attends Engineer, structural: Never    Marital Status: Married   Family History  Problem Relation Age of Onset   Heart failure Mother        died age 87   Heart failure Father        died age 19   Diabetes Other    Heart disease Neg Hx    Allergies  Allergen Reactions   Penicillins Other (See Comments) and Anaphylaxis    Taste buds   Current Outpatient Medications  Medication Sig Dispense Refill   Acetaminophen  (TYLENOL  8 HOUR PO) Take 1,000 mg by mouth 3 (three) times daily.     aspirin  EC 325 MG tablet Take 1 tablet (325 mg total) by mouth daily. 14 tablet 0    atorvastatin  (LIPITOR) 20 MG tablet TAKE 1 TABLET DAILY AT     6:00PM 90 tablet 1   carvedilol  (COREG ) 6.25 MG tablet Take 1 tablet (6.25 mg total) by mouth 2 (two) times daily with a meal. 180 tablet 3   cholecalciferol  (VITAMIN D ) 1000 UNITS tablet Take 1,000 Units by mouth daily.     DULoxetine (CYMBALTA) 30 MG capsule Take 30 mg by mouth every morning.     metFORMIN  (GLUCOPHAGE -XR) 500 MG 24 hr tablet TAKE 1 TABLET TWICE A DAY 180 tablet 3  Multiple Vitamin (MULTIVITAMIN WITH MINERALS) TABS tablet Take 1 tablet by mouth daily.     niacin  (NIASPAN ) 1000 MG CR tablet TAKE 1 TABLET AT BEDTIME 90 tablet 3   oxyCODONE  (ROXICODONE ) 5 MG immediate release tablet Take 1 tablet (5 mg total) by mouth every 4 (four) hours as needed for severe pain (pain score 7-10) or breakthrough pain. 10 tablet 0   TURMERIC CURCUMIN PO Take 2,000 mg by mouth in the morning and at bedtime.     warfarin (JANTOVEN ) 2 MG tablet TAKE 1 TO 2 TABLETS DAILY AS DIRECTED BY COUMADIN  CLINIC 110 tablet 1   No current facility-administered medications for this visit.   No results found.  Review of Systems:   A ROS was performed including pertinent positives and negatives as documented in the HPI.   Musculoskeletal Exam:    There were no vitals taken for this visit.  Right hip incision is well-appearing with 1cm circular scab at mid point. No surrounding erythema. No streaking redness. Scant serosanguinous fluid with palpation. No palpable fluctuance. No induration. No tenderness with palpation. Walks with a mildly antalgic gait, utilizing single point cane.  Distal neurosensory exam is intact 30 degrees internal/external rotation of the right hip without pain. 5/5 right hip abduction.  Imaging:       Assessment:   Presents 6 weeks status post right hip gluteus maximus tendon transfer overall doing well.  Small recurrent incisional scab with no evidence of cellulitis - advised applying OTC bacitracin. At this time will  continue physical therapy.  Will plan to see him back in 4 weeks for reassessment  Plan :    - Return to clinic as 4 weeks    Lucie Stabs, PA-C Orthopedic Surgery

## 2024-06-17 ENCOUNTER — Encounter: Payer: Self-pay | Admitting: Physical Therapy

## 2024-06-17 ENCOUNTER — Ambulatory Visit: Admitting: Physical Therapy

## 2024-06-17 DIAGNOSIS — M6281 Muscle weakness (generalized): Secondary | ICD-10-CM

## 2024-06-17 DIAGNOSIS — M25551 Pain in right hip: Secondary | ICD-10-CM | POA: Diagnosis not present

## 2024-06-17 NOTE — Therapy (Signed)
 OUTPATIENT PHYSICAL THERAPY DAILY NOTE  Patient Name: Andrew [name] MRN: [id number] DOB:1934-07-24, 89 y.o., male Today's Date: 06/17/2024   PT End of Session - 06/17/24 0845     Visit Number 6    Number of Visits --   1-2x/week   Date for PT Re-Evaluation 07/08/24    Authorization Type [employer] MCR - LEFS    Authorization Time Period 05/13/24-06/24/24    Authorization - Visit Number 6    Authorization - Number of Visits 12    Progress Note Due on Visit 10    PT Start Time 0845    PT Stop Time 0930    PT Time Calculation (min) 45 min          Past Medical History:  Diagnosis Date   Chronic kidney disease    CKD stg 3b   Colonic polyp 2011 last colo   colo q 45yr - [facility]    Coronary artery disease 02/2009   a. 02/2009 Cath: severe left main and three-vessel-->CABG; b. 12/2016 MVL EF 49%, no isch/infarct.   DEGENERATIVE JOINT DISEASE    Diastolic dysfunction    a. 10/2021 Echo: EF 55-60%, no rwma, mod conc LVH, GrI DD, mildly reduced RV fxn, RVSP 26.8 mmHg. mild AI, and aortic sclerosis.   Diverticulosis of colon    DIVERTICULOSIS, COLON    GERD (gastroesophageal reflux disease)    History of kidney stones    HYPERLIPIDEMIA-MIXED    HYPERTENSION, BENIGN    Interstitial lung disease (HCC)    LBP (low back pain)    s/p decompression 8/14; ESI - [place]; RFA x 3 early 2015   Pacemaker    a. 05/2017: St. Jude (serial number [id number]) pacemaker for CHB   Symptomatic bradycardia    a. s/p St. Jude (serial number [id number]) pacemaker 06/16/17   Type II or unspecified type diabetes mellitus without mention of complication, not stated as uncontrolled    VITAMIN D  DEFICIENCY    Past Surgical History:  Procedure Laterality Date   CARPAL TUNNEL RELEASE Right    CATARACT EXTRACTION, BILATERAL     R 07/07/13, L 07/21/13   CORONARY ARTERY BYPASS GRAFT  03/17/09   x 4   CYSTOSCOPY WITH STENT PLACEMENT Left 05/10/2021   Procedure: CYSTOSCOPY WITH LEFT  STENT PLACEMENT,LEFT RETROGRADE  PYELOGRAM;  Surgeon: Cam Morene ORN, MD;  Location: [facility];  Service: Urology;  Laterality: Left;   CYSTOSCOPY/URETEROSCOPY/HOLMIUM LASER/STENT PLACEMENT Left 06/03/2021   Procedure: CYSTOSCOPY, LEFT RETROGRADE, URETEROSCOPY,HOLMIUM LASER,STENT EXCHANGE;  Surgeon: Cam Morene ORN, MD;  Location: [facility];  Service: Urology;  Laterality: Left;   GLUTEUS MINIMUS REPAIR Right 05/06/2024   Procedure: REPAIR, TENDON, GLUTEUS MINIMUS;  Surgeon: Genelle Standing, MD;  Location: [facility];  Service: Orthopedics;  Laterality: Right;  RIGHT GLUTEUS MAXIMUS TENDON TRANSFER   INGUINAL HERNIA REPAIR  1980   KNEE ARTHROSCOPY  2007   right   LUMBAR EPIDURAL INJECTION  2013   Has as needed.   LUMBAR LAMINECTOMY/DECOMPRESSION MICRODISCECTOMY N/A 06/19/2013   Procedure: LUMBAR LAMINECTOMY/DECOMPRESSION MICRODISCECTOMY;  Surgeon: Oneil Rodgers Priestly, MD;  Location: [facility];  Service: Orthopedics;  Laterality: N/A;  Lumbar 4-5 decompression   PACEMAKER IMPLANT N/A 06/16/2017   Procedure: Pacemaker Implant;  Surgeon: Waddell Danelle ORN, MD;  Location: [facility];  Service: Cardiovascular;  Laterality: N/A;   TRIGGER FINGER RELEASE Right    Patient Active Problem List   Diagnosis Date Noted   Weight loss 04/01/2024   Tear of right  gluteus minimus tendon 12/21/2023   Anemia 04/02/2023   Hematoma 03/18/2023   Supratherapeutic INR 03/18/2023   Pulmonary fibrosis (HCC) 04/02/2022   Poor balance 12/28/2021   Bilateral leg weakness 04/01/2021   CKD (chronic kidney disease) stage 3, GFR 30-59 ml/min (HCC) 03/31/2021   Pacemaker 07/21/2019   History of deep venous thrombosis (DVT) of distal vein of left lower extremity 08/28/2017   Complete heart block (HCC) - PPM 06/15/2017   Wears hearing aid 09/25/2016   Degeneration of lumbar or lumbosacral intervertebral disc 03/23/2015   Vitamin D  deficiency 11/03/2010   Diverticulosis of colon 11/03/2010   Osteoarthritis (arthritis due to wear and  tear of joints) 11/03/2010   Dyslipidemia, goal LDL below 70 12/01/2009   CAD, ARTERY BYPASS GRAFT 12/01/2009   DOE (dyspnea on exertion) 12/01/2009   Type 2 diabetes mellitus with neurological manifestations, controlled (HCC)     PCP: Geofm Glade PARAS, MD  REFERRING PROVIDER: Genelle Standing, MD  THERAPY DIAG:  Pain in right hip  Muscle weakness  REFERRING DIAG: Tear of right gluteus minimus tendon, initial encounter [S76.011A]   Rationale for Evaluation and Treatment:  Rehabilitation  SUBJECTIVE:  PERTINENT PAST HISTORY:  Right hip gluteus maximus tendon transfer and Right hip open trochanteric bursectomy 7/15 for glute med repair, DM, CAD with bypass 2017, DOE, laminectomy, pacemaker, hx of DVT, blood thinner       PRECAUTIONS: see protocol below  WEIGHT BEARING RESTRICTIONS   WBAT per OP Note  OP Date: 05/06/2024  2 weeks 05/20/2024  4 weeks 06/03/2024  6 weeks 06/17/2024  8 weeks 07/01/2024  10 weeks 07/15/2024  12 weeks 07/29/2024   Glute med protocol:   [email or web address]  FALLS:  Has patient fallen in last 6 months? No, Number of falls: 0  MOI/History of condition:  Onset date: 7/15  SUBJECTIVE STATEMENT  06/17/2024: PT reports that he saw Pa-C for follow up yesterday. Was given antibiotic cream for incision.   EVAL: Andrew [name] is a 89 y.o. male who presents to clinic with chief complaint of chronic R hip pain and stiffness.  Well known to this PT and clinic.  Chronic glute min/med tear which was was repaired on 7/15 using glute max tendon transfer.  He has had quite a bit of pain since the surgery but this is improving.  1 dressing change with no current drainage.  Has son at home who has been helpful.   Red flags:  denies malaise, erythema / streaking, and chills / night sweats  Pain:  Are you having pain?  Yes Pain location: R hip pain NPRS scale: 3-4/10 Best: 0/10, Worst: 5/10 Aggravating factors: walking, standing  Relieving factors: rest, sitting Pain description: intermittent  Occupation: retired   Education administrator: [employer]   Hand Dominance: NA  Patient Goals/Specific Activities: reduce pain, improve ability to complete ADLs   OBJECTIVE:   GENERAL OBSERVATION/GAIT: Antalgic gait with reduced time in stance on R  LE MMT:  MMT Right (Eval) Left (Eval)  Hip flexion (L2, L3)    Knee extension (L3)    Knee flexion    Hip abduction    Hip extension    Hip external rotation    Hip internal rotation    Hip adduction    Ankle dorsiflexion (L4)    Ankle plantarflexion (S1)    Ankle inversion    Ankle eversion    Great Toe ext (L5)    Grossly     (Blank rows = not tested, score listed  is out of 5 possible points.  N = WNL, D = diminished, C = clear for gross weakness with myotome testing, * = concordant pain with testing)  LE ROM:  ROM Right (Eval) Left (Eval) Right 06/09/24  Hip flexion 90 (stopped per protocol)   Easily to 100 PROM  Hip extension     Hip abduction     Hip adduction     Hip internal rotation     Hip external rotation     Knee extension     Knee flexion     Ankle dorsiflexion     Ankle plantarflexion     Ankle inversion     Ankle eversion      (Blank rows = not tested, N = WNL, * = concordant pain with testing)  Functional Tests  Eval    6 MWT: unable to participate d/t pain                                                          PATIENT SURVEYS:  LEFS: 16/80   TODAY'S TREATMENT: [facility] Adult PT Treatment  06/17/2024:  Therapeutic Exercise: Bike - 10 min - L3 Hip adduction squeeze - 5'' hold - 2x10 Knee ext machine - bil x25 - 5# - no pain Knee flexion machine - bil 20# - x 25- no pain Standing 4 way hip x 10 each bilateral Standing heel raise x 10   Neuromuscular re-ed: Bridge with cues for glute activation x 15   Supine march with cues for ab bracing     [facility] Adult PT Treatment  05/19/2024:  Therapeutic Exercise: Bike - 10 min Hip adduction squeeze - 5'' hold - 2x10 SAQ - 5'' - 2x10 - 5# HS curl - GTB   Neuromuscular re-ed: Glute setting - with small lift  - 3x10 - no pain Isometric HL clam - no pain - sub max Sub max isometric hip flexion into ball -   Manual Therapy: PROM to 90 degrees of flexion PROM abd    HOME EXERCISE PROGRAM: Access Code: [email or web address] URL: [email or web address] Date: 05/13/2024 Prepared by: Helene Gasmen  Exercises - Supine Gluteal Sets  - 1-2 x daily - 7 x weekly - 2 sets - 10 reps - 5'' hold - Supine Hip Adduction Isometric with Ball  - 1-2 x daily - 7 x weekly - 2 sets - 10 reps - 5'' hold - Supine Heel Slide  - 1-2 x daily - 7 x weekly - 2 sets - 10 reps   Treatment priorities   Eval                                                  ASSESSMENT:  CLINICAL IMPRESSION:  06/17/2024: Andrew [name] saw hip PA-C for f/u yesterday. Recommended OTC bacitracin antibiotic cream for a small area along incision that scabs and drains. Otherwise he will f/u again in 4 more weeks. Today, he tolerated PT session well with no adverse reaction.  Progressing per protocol with no issue. Started 6 week phase today, adding standing 4 way hip.  No pain reported.      Andrew [name] is a 89 y.o. male who presents to  clinic with signs and sxs consistent with R hip pain following glute min repair on 7/15.   Still having high pain levels but only about 1 week out at this point.  Started with some sub max isometrics and gentle ROM.  Did not complete strength testing d/t precautions and pain.   Andrew [name] will benefit from skilled PT to address relevant deficits and improve ability to complete daily tasks such as walking, standing, and bending in order to care for himself and his wife.   OBJECTIVE IMPAIRMENTS: Pain, hip ROM, hip strength, gait, balance  ACTIVITY LIMITATIONS:  walking, standing, bending, limiting, squatting  PERSONAL FACTORS: See medical history and pertinent history   REHAB POTENTIAL: Good  CLINICAL DECISION MAKING: Evolving/moderate complexity  EVALUATION COMPLEXITY: Moderate   GOALS:   SHORT TERM GOALS: Target date: 06/10/2024   [place] will be >75% HEP compliant to improve carryover between sessions and facilitate independent management of condition  Evaluation: ongoing Goal status: MET   LONG TERM GOALS: Target date: 07/08/2024   Andrew [name] will self report >/= 50% decrease in pain from evaluation to improve function in daily tasks  Evaluation/Baseline: 8/10 max pain Goal status: INITIAL   2.  Andrew [name] will show a >/= 27 pt improvement in LEFS score (MCID is ~11% or 9 pts) as a proxy for functional improvement   Evaluation/Baseline: 16/80 pts Goal status: INITIAL   3.  Andrew [name] will be able to complete household tasks including caring for his wife, not limited by pain  Evaluation/Baseline: limited Goal status: INITIAL   4.  Andrew [name] will improve 6 minute walk test to age adjusted norm (MCID 50 m).  Evaluation/Baseline: not tested Goal status: INITIAL     5.  Andrew [name] will improve 30'' STS (MCID 2) to >/= 10x (w/ UE?: N) to show improved LE strength and improved transfers   Evaluation/Baseline: 10 tested Goal status: INITIAL   PLAN: PT FREQUENCY: 1-2x/week  PT DURATION: 8 weeks  PLANNED INTERVENTIONS:  97164- PT Re-evaluation, 97110-Therapeutic exercises, 97530- Therapeutic activity, [id number]- Neuromuscular re-education, 97535- Self Care, 02859- Manual therapy, [id number]- Gait training, [id number]- Aquatic Therapy, [phone]- Electrical stimulation (manual), [id number]- Vasopneumatic device, [id number]- Traction (mechanical), [id number]- Ionotophoresis 4mg /ml Dexamethasone , Taping, Dry Needling, Joint manipulation, and Spinal manipulation.   Harlene Persons, PTA 06/17/24 1:13 PM Phone: [phone] Fax: [phone]   Name: Andrew [name]   MRN: [id number] Please request 2x/week for 12 weeks.    Check all possible CPT codes: 02889- Therapeutic Exercise, [phone]- Neuro Re-education, [phone] - Gait Training, [phone] - Manual Therapy, 97530 - Therapeutic Activities, 97535 - Self Care, [phone] - Re-evaluation, [id number] - Mechanical traction, and [id number] - Aquatic therapy   Thank you!  MCD - Secure   Date of referral: 6/26 Referring provider: Genelle Referring diagnosis? Tear of right gluteus minimus tendon, initial encounter [S76.011A]  Treatment diagnosis? (if different than referring diagnosis) NA  What was this (referring dx) caused by? Surgery (Type: glute med repair)  Lysle of Condition: Initial Onset (within last 3 months)   Laterality: Rt  Current Functional Measure Score: LEFS 16/80  Objective measurements identify impairments when they are compared to normal values, the uninvolved extremity, and prior level of function.  [x]  Yes  []  No  Objective assessment of functional ability: Severe functional limitations   Briefly describe symptoms: difficulty in walking and daily tasks including self care and care for his wife  How did symptoms start: surgery  Average pain intensity:  Last 24 hours: 8  Past week: 8  How often  does the pt experience symptoms? Frequently  How much have the symptoms interfered with usual daily activities? Extremely  How has condition changed since care began at this facility? NA - initial visit  In general, how is the patients overall health? Good   BACK PAIN (STarT Back Screening Tool) No

## 2024-06-19 ENCOUNTER — Ambulatory Visit: Admitting: Physical Therapy

## 2024-06-19 ENCOUNTER — Encounter: Payer: Self-pay | Admitting: Physical Therapy

## 2024-06-19 DIAGNOSIS — M25551 Pain in right hip: Secondary | ICD-10-CM | POA: Diagnosis not present

## 2024-06-19 DIAGNOSIS — M6281 Muscle weakness (generalized): Secondary | ICD-10-CM

## 2024-06-19 DIAGNOSIS — R2681 Unsteadiness on feet: Secondary | ICD-10-CM

## 2024-06-19 DIAGNOSIS — M5459 Other low back pain: Secondary | ICD-10-CM

## 2024-06-19 NOTE — Therapy (Signed)
 OUTPATIENT PHYSICAL THERAPY DAILY NOTE  Patient Name: Andrew [name] MRN: [id number] DOB:[birth year], 89 y.o., male Today's Date: 06/19/2024   PT End of Session - 06/19/24 0802     Visit Number 7    Number of Visits --   1-2x/week   Date for PT Re-Evaluation 07/08/24    Authorization Type [employer] MCR - LEFS    Authorization Time Period 05/13/24-06/24/24    Authorization - Visit Number 7    Authorization - Number of Visits 12    Progress Note Due on Visit 10    PT Start Time 0800    PT Stop Time 0841    PT Time Calculation (min) 41 min          Past Medical History:  Diagnosis Date   Chronic kidney disease    CKD stg 3b   Colonic polyp 2011 last colo   colo q 68yr - [facility]    Coronary artery disease 02/2009   a. 02/2009 Cath: severe left main and three-vessel-->CABG; b. 12/2016 MVL EF 49%, no isch/infarct.   DEGENERATIVE JOINT DISEASE    Diastolic dysfunction    a. 10/2021 Echo: EF 55-60%, no rwma, mod conc LVH, GrI DD, mildly reduced RV fxn, RVSP 26.8 mmHg. mild AI, and aortic sclerosis.   Diverticulosis of colon    DIVERTICULOSIS, COLON    GERD (gastroesophageal reflux disease)    History of kidney stones    HYPERLIPIDEMIA-MIXED    HYPERTENSION, BENIGN    Interstitial lung disease (HCC)    LBP (low back pain)    s/p decompression 8/14; ESI - [place]; RFA x 3 early 2015   Pacemaker    a. 05/2017: St. Jude (serial number [id number]) pacemaker for CHB   Symptomatic bradycardia    a. s/p St. Jude (serial number [id number]) pacemaker 06/16/17   Type II or unspecified type diabetes mellitus without mention of complication, not stated as uncontrolled    VITAMIN D  DEFICIENCY    Past Surgical History:  Procedure Laterality Date   CARPAL TUNNEL RELEASE Right    CATARACT EXTRACTION, BILATERAL     R 07/07/13, L 07/21/13   CORONARY ARTERY BYPASS GRAFT  03/17/09   x 4   CYSTOSCOPY WITH STENT PLACEMENT Left 05/10/2021   Procedure: CYSTOSCOPY WITH LEFT  STENT PLACEMENT,LEFT RETROGRADE  PYELOGRAM;  Surgeon: Cam Morene ORN, MD;  Location: [facility];  Service: Urology;  Laterality: Left;   CYSTOSCOPY/URETEROSCOPY/HOLMIUM LASER/STENT PLACEMENT Left 06/03/2021   Procedure: CYSTOSCOPY, LEFT RETROGRADE, URETEROSCOPY,HOLMIUM LASER,STENT EXCHANGE;  Surgeon: Cam Morene ORN, MD;  Location: [facility];  Service: Urology;  Laterality: Left;   GLUTEUS MINIMUS REPAIR Right 05/06/2024   Procedure: REPAIR, TENDON, GLUTEUS MINIMUS;  Surgeon: Genelle Standing, MD;  Location: [facility];  Service: Orthopedics;  Laterality: Right;  RIGHT GLUTEUS MAXIMUS TENDON TRANSFER   INGUINAL HERNIA REPAIR  1980   KNEE ARTHROSCOPY  2007   right   LUMBAR EPIDURAL INJECTION  2013   Has as needed.   LUMBAR LAMINECTOMY/DECOMPRESSION MICRODISCECTOMY N/A 06/19/2013   Procedure: LUMBAR LAMINECTOMY/DECOMPRESSION MICRODISCECTOMY;  Surgeon: Oneil Rodgers Priestly, MD;  Location: [facility];  Service: Orthopedics;  Laterality: N/A;  Lumbar 4-5 decompression   PACEMAKER IMPLANT N/A 06/16/2017   Procedure: Pacemaker Implant;  Surgeon: Waddell Danelle ORN, MD;  Location: [facility];  Service: Cardiovascular;  Laterality: N/A;   TRIGGER FINGER RELEASE Right    Patient Active Problem List   Diagnosis Date Noted   Weight loss 04/01/2024   Tear of right  gluteus minimus tendon 12/21/2023   Anemia 04/02/2023   Hematoma 03/18/2023   Supratherapeutic INR 03/18/2023   Pulmonary fibrosis (HCC) 04/02/2022   Poor balance 12/28/2021   Bilateral leg weakness 04/01/2021   CKD (chronic kidney disease) stage 3, GFR 30-59 ml/min (HCC) 03/31/2021   Pacemaker 07/21/2019   History of deep venous thrombosis (DVT) of distal vein of left lower extremity 08/28/2017   Complete heart block (HCC) - PPM 06/15/2017   Wears hearing aid 09/25/2016   Degeneration of lumbar or lumbosacral intervertebral disc 03/23/2015   Vitamin D  deficiency 11/03/2010   Diverticulosis of colon 11/03/2010   Osteoarthritis (arthritis due to wear and  tear of joints) 11/03/2010   Dyslipidemia, goal LDL below 70 12/01/2009   CAD, ARTERY BYPASS GRAFT 12/01/2009   DOE (dyspnea on exertion) 12/01/2009   Type 2 diabetes mellitus with neurological manifestations, controlled (HCC)     PCP: Geofm Glade PARAS, MD  REFERRING PROVIDER: Geofm Glade PARAS, MD  THERAPY DIAG:  Pain in right hip  Muscle weakness  Unsteadiness on feet  Other low back pain  REFERRING DIAG: Tear of right gluteus minimus tendon, initial encounter [S76.011A]   Rationale for Evaluation and Treatment:  Rehabilitation  SUBJECTIVE:  PERTINENT PAST HISTORY:  Right hip gluteus maximus tendon transfer and Right hip open trochanteric bursectomy 7/15 for glute med repair, DM, CAD with bypass 2017, DOE, laminectomy, pacemaker, hx of DVT, blood thinner       PRECAUTIONS: see protocol below  WEIGHT BEARING RESTRICTIONS   WBAT per OP Note  OP Date: 05/06/2024  2 weeks 05/20/2024  4 weeks 06/03/2024  6 weeks 06/17/2024  8 weeks 07/01/2024  10 weeks 07/15/2024  12 weeks 07/29/2024   Glute med protocol:   [email or web address]  FALLS:  Has patient fallen in last 6 months? No, Number of falls: 0  MOI/History of condition:  Onset date: 7/15  SUBJECTIVE STATEMENT  06/19/2024: Pt reports that he has been using antibiotic cream on his incision which is reducing redness.  EVAL: Andrew [name] is a 89 y.o. male who presents to clinic with chief complaint of chronic R hip pain and stiffness.  [name] known to this PT and clinic.  Chronic glute min/med tear which was was repaired on 7/15 using glute max tendon transfer.  He has had quite a bit of pain since the surgery but this is improving.  1 dressing change with no current drainage.  Has son at home who has been helpful.   Red flags:  denies malaise, erythema / streaking, and chills /  night sweats  Pain:  Are you having pain? Yes Pain location: R hip pain NPRS scale: 3-4/10 Best: 0/10, Worst: 5/10 Aggravating factors: walking, standing  Relieving factors: rest, sitting Pain description: intermittent  Occupation: retired   Education administrator: [employer]   Hand Dominance: NA  Patient Goals/Specific Activities: reduce pain, improve ability to complete ADLs   OBJECTIVE:   GENERAL OBSERVATION/GAIT: Antalgic gait with reduced time in stance on R  LE MMT:  MMT Right (Eval) Left (Eval)  Hip flexion (L2, L3)    Knee extension (L3)    Knee flexion    Hip abduction    Hip extension    Hip external rotation    Hip internal rotation    Hip adduction    Ankle dorsiflexion (L4)    Ankle plantarflexion (S1)    Ankle inversion    Ankle eversion    Great Toe ext (L5)    Grossly     (  Blank rows = not tested, score listed is out of 5 possible points.  N = WNL, D = diminished, C = clear for gross weakness with myotome testing, * = concordant pain with testing)  LE ROM:  ROM Right (Eval) Left (Eval) Right 06/09/24 8/28  Hip flexion 90 (stopped per protocol)   Easily to 100 PROM Symmetrical   Hip extension      Hip abduction      Hip adduction      Hip internal rotation      Hip external rotation      Knee extension      Knee flexion      Ankle dorsiflexion      Ankle plantarflexion      Ankle inversion      Ankle eversion       (Blank rows = not tested, N = WNL, * = concordant pain with testing)  Functional Tests  Eval    6 MWT: unable to participate d/t pain                                                          PATIENT SURVEYS:  LEFS: 16/80   TODAY'S TREATMENT:  [facility] Adult PT Treatment  06/19/2024:  Therapeutic Exercise: Bike - 5 min  Therapeutic Activity  STS from raised table - 5x5 Calf raises Standing 4-way hip - 2x7 ea Step up 4'' step w/ UE support - 7x ea fwd and lat - R only - R hip pain with L step up  Manual  Therapy: PROM flexion    HOME EXERCISE PROGRAM: Access Code: [email or web address] URL: [email or web address] Date: 06/19/2024 Prepared by: Helene Gasmen  Exercises - Standing Hip Abduction with Counter Support  - 1 x daily - 7 x weekly - 3 sets - 10 reps - Standing Hip Extension with Counter Support  - 1 x daily - 7 x weekly - 3 sets - 10 reps - Mini Squat with Counter Support  - 1 x daily - 7 x weekly - 3 sets - 10 reps - Standing Hip Flexion with Counter Support  - 1 x daily - 7 x weekly - 3 sets - 10 reps   Treatment priorities   Eval                                                  ASSESSMENT:  CLINICAL IMPRESSION:  06/19/2024: Andrew [name] with no adverse reaction.  Progressing per protocol.  No pain reported with exception of R step up which did cause some discomfort in the R lateral hip and was d/c'd immediately with pt reporting reduction in pain.  HEP updated.    Andrew [name] is a 89 y.o. male who presents to clinic with signs and sxs consistent with R hip pain following glute min repair on 7/15.   Still having high pain levels but only about 1 week out at this point.  Started with some sub max isometrics and gentle ROM.  Did not complete strength testing d/t precautions and pain.   Andrew [name] will benefit from skilled PT to address relevant deficits and improve ability to complete daily tasks such as walking, standing,  and bending in order to care for himself and his wife.   OBJECTIVE IMPAIRMENTS: Pain, hip ROM, hip strength, gait, balance  ACTIVITY LIMITATIONS: walking, standing, bending, limiting, squatting  PERSONAL FACTORS: See medical history and pertinent history   REHAB POTENTIAL: Good  CLINICAL DECISION MAKING: Evolving/moderate complexity  EVALUATION COMPLEXITY: Moderate   GOALS:   SHORT TERM GOALS: Target date: 06/10/2024   Andrew [name] will be >75% HEP compliant to improve carryover between sessions and facilitate independent management  of condition  Evaluation: ongoing Goal status: MET   LONG TERM GOALS: Target date: 07/08/2024   Andrew [name] will self report >/= 50% decrease in pain from evaluation to improve function in daily tasks  Evaluation/Baseline: 8/10 max pain Goal status: INITIAL   2.  Andrew [name] will show a >/= 27 pt improvement in LEFS score (MCID is ~11% or 9 pts) as a proxy for functional improvement   Evaluation/Baseline: 16/80 pts Goal status: INITIAL   3.  Andrew [name] will be able to complete household tasks including caring for his wife, not limited by pain  Evaluation/Baseline: limited Goal status: INITIAL   4.  Bodin will improve 6 minute walk test to age adjusted norm (MCID 50 m).  Evaluation/Baseline: not tested Goal status: INITIAL     5.  Talib will improve 30'' STS (MCID 2) to >/= 10x (w/ UE?: N) to show improved LE strength and improved transfers   Evaluation/Baseline: 10 tested Goal status: INITIAL   PLAN: PT FREQUENCY: 1-2x/week  PT DURATION: 8 weeks  PLANNED INTERVENTIONS:  97164- PT Re-evaluation, 97110-Therapeutic exercises, 97530- Therapeutic activity, [id number]- Neuromuscular re-education, 97535- Self Care, 02859- Manual therapy, [id number]- Gait training, [id number]- Aquatic Therapy, [phone]- Electrical stimulation (manual), [id number]- Vasopneumatic device, [id number]- Traction (mechanical), [id number]- Ionotophoresis 4mg /ml Dexamethasone , Taping, Dry Needling, Joint manipulation, and Spinal manipulation.   Helene BRAVO Franny Selvage PT 06/19/24 8:43 AM Phone: [phone] Fax: [phone]   Name: Lawerence Dery  MRN: [id number] Please request 2x/week for 12 weeks.    Check all possible CPT codes: 02889- Therapeutic Exercise, [phone]- Neuro Re-education, [phone] - Gait Training, [phone] - Manual Therapy, 97530 - Therapeutic Activities, 97535 - Self Care, [phone] - Re-evaluation, [id number] - Mechanical traction, and [id number] - Aquatic therapy   Thank you!  MCD - Secure   Date of referral: 6/26 Referring  provider: Genelle Referring diagnosis? Tear of right gluteus minimus tendon, initial encounter [S76.011A]  Treatment diagnosis? (if different than referring diagnosis) NA  What was this (referring dx) caused by? Surgery (Type: glute med repair)  Lysle of Condition: Initial Onset (within last 3 months)   Laterality: Rt  Current Functional Measure Score: LEFS 16/80  Objective measurements identify impairments when they are compared to normal values, the uninvolved extremity, and prior level of function.  [x]  Yes  []  No  Objective assessment of functional ability: Severe functional limitations   Briefly describe symptoms: difficulty in walking and daily tasks including self care and care for his wife  How did symptoms start: surgery  Average pain intensity:  Last 24 hours: 8  Past week: 8  How often does the pt experience symptoms? Frequently  How much have the symptoms interfered with usual daily activities? Extremely  How has condition changed since care began at this facility? NA - initial visit  In general, how is the patients overall health? Good   BACK PAIN (STarT Back Screening Tool) No

## 2024-06-24 ENCOUNTER — Ambulatory Visit: Attending: Internal Medicine | Admitting: Physical Therapy

## 2024-06-24 ENCOUNTER — Encounter: Payer: Self-pay | Admitting: Physical Therapy

## 2024-06-24 DIAGNOSIS — R2681 Unsteadiness on feet: Secondary | ICD-10-CM | POA: Diagnosis present

## 2024-06-24 DIAGNOSIS — M6281 Muscle weakness (generalized): Secondary | ICD-10-CM | POA: Diagnosis present

## 2024-06-24 DIAGNOSIS — M25551 Pain in right hip: Secondary | ICD-10-CM | POA: Insufficient documentation

## 2024-06-24 DIAGNOSIS — M5459 Other low back pain: Secondary | ICD-10-CM | POA: Insufficient documentation

## 2024-06-24 NOTE — Therapy (Signed)
 OUTPATIENT PHYSICAL THERAPY DAILY NOTE  Patient Name: Andrew [name] MRN: [id number] DOB:[birth year], 89 y.o., male Today's Date: 06/24/2024   PT End of Session - 06/24/24 1009     Visit Number 8    Number of Visits --   1-2x/week   Date for PT Re-Evaluation 07/08/24    Authorization Type U[employer] MCR - LEFS    Authorization Time Period 05/13/24-06/24/24    Authorization - Visit Number 8    Authorization - Number of Visits 12    Progress Note Due on Visit 10    PT Start Time 1015    PT Stop Time 1056    PT Time Calculation (min) 41 min          Past Medical History:  Diagnosis Date   Chronic kidney disease    CKD stg 3b   Colonic polyp 2011 last colo   colo q 68yr - [facility]    Coronary artery disease 02/2009   a. 02/2009 Cath: severe left main and three-vessel-->CABG; b. 12/2016 MVL EF 49%, no isch/infarct.   DEGENERATIVE JOINT DISEASE    Diastolic dysfunction    a. 10/2021 Echo: EF 55-60%, no rwma, mod conc LVH, GrI DD, mildly reduced RV fxn, RVSP 26.8 mmHg. mild AI, and aortic sclerosis.   Diverticulosis of colon    DIVERTICULOSIS, COLON    GERD (gastroesophageal reflux disease)    History of kidney stones    HYPERLIPIDEMIA-MIXED    HYPERTENSION, BENIGN    Interstitial lung disease (HCC)    LBP (low back pain)    s/p decompression 8/14; ESI - [place]; RFA x 3 early 2015   Pacemaker    a. 05/2017: St. Jude (serial number [id number]) pacemaker for CHB   Symptomatic bradycardia    a. s/p St. Jude (serial number [id number]) pacemaker 06/16/17   Type II or unspecified type diabetes mellitus without mention of complication, not stated as uncontrolled    VITAMIN D  DEFICIENCY    Past Surgical History:  Procedure Laterality Date   CARPAL TUNNEL RELEASE Right    CATARACT EXTRACTION, BILATERAL     R 07/07/13, L 07/21/13   CORONARY ARTERY BYPASS GRAFT  03/17/09   x 4   CYSTOSCOPY WITH STENT PLACEMENT Left 05/10/2021   Procedure: CYSTOSCOPY WITH LEFT  STENT PLACEMENT,LEFT RETROGRADE  PYELOGRAM;  Surgeon: Cam Morene ORN, MD;  Location: [facility];  Service: Urology;  Laterality: Left;   CYSTOSCOPY/URETEROSCOPY/HOLMIUM LASER/STENT PLACEMENT Left 06/03/2021   Procedure: CYSTOSCOPY, LEFT RETROGRADE, URETEROSCOPY,HOLMIUM LASER,STENT EXCHANGE;  Surgeon: Cam Morene ORN, MD;  Location: [facility];  Service: Urology;  Laterality: Left;   GLUTEUS MINIMUS REPAIR Right 05/06/2024   Procedure: REPAIR, TENDON, GLUTEUS MINIMUS;  Surgeon: Genelle Standing, MD;  Location: [facility];  Service: Orthopedics;  Laterality: Right;  RIGHT GLUTEUS MAXIMUS TENDON TRANSFER   INGUINAL HERNIA REPAIR  1980   KNEE ARTHROSCOPY  2007   right   LUMBAR EPIDURAL INJECTION  2013   Has as needed.   LUMBAR LAMINECTOMY/DECOMPRESSION MICRODISCECTOMY N/A 06/19/2013   Procedure: LUMBAR LAMINECTOMY/DECOMPRESSION MICRODISCECTOMY;  Surgeon: Oneil Rodgers Priestly, MD;  Location: [facility] OR;  Service: Orthopedics;  Laterality: N/A;  Lumbar 4-5 decompression   PACEMAKER IMPLANT N/A 06/16/2017   Procedure: Pacemaker Implant;  Surgeon: Waddell Danelle ORN, MD;  Location: [facility] INVASIVE CV LAB;  Service: Cardiovascular;  Laterality: N/A;   TRIGGER FINGER RELEASE Right    Patient Active Problem List   Diagnosis Date Noted   Weight loss 04/01/2024   Tear of right  gluteus minimus tendon 12/21/2023   Anemia 04/02/2023   Hematoma 03/18/2023   Supratherapeutic INR 03/18/2023   Pulmonary fibrosis (HCC) 04/02/2022   Poor balance 12/28/2021   Bilateral leg weakness 04/01/2021   CKD (chronic kidney disease) stage 3, GFR 30-59 ml/min (HCC) 03/31/2021   Pacemaker 07/21/2019   History of deep venous thrombosis (DVT) of distal vein of left lower extremity 08/28/2017   Complete heart block (HCC) - PPM 06/15/2017   Wears hearing aid 09/25/2016   Degeneration of lumbar or lumbosacral intervertebral disc 03/23/2015   Vitamin D  deficiency 11/03/2010   Diverticulosis of colon 11/03/2010   Osteoarthritis (arthritis due to wear and  tear of joints) 11/03/2010   Dyslipidemia, goal LDL below 70 12/01/2009   CAD, ARTERY BYPASS GRAFT 12/01/2009   DOE (dyspnea on exertion) 12/01/2009   Type 2 diabetes mellitus with neurological manifestations, controlled (HCC)     PCP: Geofm Glade PARAS, MD  REFERRING PROVIDER: Geofm Glade PARAS, MD  THERAPY DIAG:  Pain in right hip  Muscle weakness  Unsteadiness on feet  Other low back pain  REFERRING DIAG: Tear of right gluteus minimus tendon, initial encounter [S76.011A]   Rationale for Evaluation and Treatment:  Rehabilitation  SUBJECTIVE:  PERTINENT PAST HISTORY:  Right hip gluteus maximus tendon transfer and Right hip open trochanteric bursectomy 7/15 for glute med repair, DM, CAD with bypass 2017, DOE, laminectomy, pacemaker, hx of DVT, blood thinner       PRECAUTIONS: see protocol below  WEIGHT BEARING RESTRICTIONS   WBAT per OP Note  OP Date: 05/06/2024  2 weeks 05/20/2024  4 weeks 06/03/2024  6 weeks 06/17/2024  8 weeks 07/01/2024  10 weeks 07/15/2024  12 weeks 07/29/2024   Glute med protocol:   [email or web address]  FALLS:  Has patient fallen in last 6 months? No, Number of falls: 0  MOI/History of condition:  Onset date: 7/15  SUBJECTIVE STATEMENT  06/24/2024: Pt reports that he has had a difficult weekend d/t back pain.  He will try to get ESI in the next week or so.  He reports he scratched a pimple on his thigh which is now inflamed (suggested he contact his PCP about this).  His surgical site has been fairly comfortable.  EVAL: Andrew [name] is a 89 y.o. male who presents to clinic with chief complaint of chronic R hip pain and stiffness.  Well known to this PT and clinic.  Chronic glute min/med tear which was was repaired on 7/15 using glute max tendon transfer.  He has had quite a bit of pain since the surgery  but this is improving.  1 dressing change with no current drainage.  Has son at home who has been helpful.   Red flags:  denies malaise, erythema / streaking, and chills / night sweats  Pain:  Are you having pain? Yes Pain location: R hip pain NPRS scale: 3-4/10 Best: 0/10, Worst: 5/10 Aggravating factors: walking, standing  Relieving factors: rest, sitting Pain description: intermittent  Occupation: retired   Education administrator: [employer]   Hand Dominance: NA  Patient Goals/Specific Activities: reduce pain, improve ability to complete ADLs   OBJECTIVE:   GENERAL OBSERVATION/GAIT: Antalgic gait with reduced time in stance on R  LE MMT:  MMT Right (Eval) Left (Eval)  Hip flexion (L2, L3)    Knee extension (L3)    Knee flexion    Hip abduction    Hip extension    Hip external rotation    Hip internal rotation  Hip adduction    Ankle dorsiflexion (L4)    Ankle plantarflexion (S1)    Ankle inversion    Ankle eversion    Great Toe ext (L5)    Grossly     (Blank rows = not tested, score listed is out of 5 possible points.  N = WNL, D = diminished, C = clear for gross weakness with myotome testing, * = concordant pain with testing)  LE ROM:  ROM Right (Eval) Left (Eval) Right 06/09/24 8/28  Hip flexion 90 (stopped per protocol)   Easily to 100 PROM Symmetrical   Hip extension      Hip abduction      Hip adduction      Hip internal rotation      Hip external rotation      Knee extension      Knee flexion      Ankle dorsiflexion      Ankle plantarflexion      Ankle inversion      Ankle eversion       (Blank rows = not tested, N = WNL, * = concordant pain with testing)  Functional Tests  Eval    6 MWT: unable to participate d/t pain                                                          PATIENT SURVEYS:  LEFS: 16/80   TODAY'S TREATMENT:  [facility] Adult PT Treatment  06/24/2024:  Therapeutic Exercise: Bike - 6 min - L3 Knee ext machine -  10# - 3x10 Knee flexion machine - 20# - 3x10  Therapeutic Activity  Calf raises - 2x15 Standing hip abd and ext - 2x10 ea Standing march on foam - 2x10 ea Step up to foam - 3x5 ea Lateral walking at counter    HOME EXERCISE PROGRAM: Access Code: [email or web address] URL: [email or web address] Date: 06/19/2024 Prepared by: Helene Gasmen  Exercises - Standing Hip Abduction with Counter Support  - 1 x daily - 7 x weekly - 3 sets - 10 reps - Standing Hip Extension with Counter Support  - 1 x daily - 7 x weekly - 3 sets - 10 reps - Mini Squat with Counter Support  - 1 x daily - 7 x weekly - 3 sets - 10 reps - Standing Hip Flexion with Counter Support  - 1 x daily - 7 x weekly - 3 sets - 10 reps   Treatment priorities   Eval                                                  ASSESSMENT:  CLINICAL IMPRESSION:  06/24/2024: Andrew [name] tolerated session well with no adverse reaction.  Pt with increasing low back pain over the last several visits (gets injections ~ every 3 months and is due soon).  Recommended he call to get this scheduled as it is beginning to interfere with standing activities in PT.  Worked on lateral hip strengthening in closed and open chain today.  Pt continues to demonstrate functional lateral hip weakness in R stance; unable to maintain neutral pelvis in R stance.  Reduced step height for low back comfort today.  Pt reports no increase in pain following visit.     Andrew [name] is a 89 y.o. male who presents to clinic with signs and sxs consistent with R hip pain following glute min repair on 7/15.   Still having high pain levels but only about 1 week out at this point.  Started with some sub max isometrics and gentle ROM.  Did not complete strength testing d/t precautions and pain.   Andrew [name] will benefit from skilled PT to address relevant deficits and improve ability to complete daily tasks such as walking, standing, and bending in order to care for himself and his wife.    OBJECTIVE IMPAIRMENTS: Pain, hip ROM, hip strength, gait, balance  ACTIVITY LIMITATIONS: walking, standing, bending, limiting, squatting  PERSONAL FACTORS: See medical history and pertinent history   REHAB POTENTIAL: Good  CLINICAL DECISION MAKING: Evolving/moderate complexity  EVALUATION COMPLEXITY: Moderate   GOALS:   SHORT TERM GOALS: Target date: 06/10/2024   Lory will be >75% HEP compliant to improve carryover between sessions and facilitate independent management of condition  Evaluation: ongoing Goal status: MET   LONG TERM GOALS: Target date: 07/08/2024   Andrew [name] will self report >/= 50% decrease in pain from evaluation to improve function in daily tasks  Evaluation/Baseline: 8/10 max pain Goal status: INITIAL   2.  Andrew [name] will show a >/= 27 pt improvement in LEFS score (MCID is ~11% or 9 pts) as a proxy for functional improvement   Evaluation/Baseline: 16/80 pts Goal status: INITIAL   3.  Andrew [name] will be able to complete household tasks including caring for his wife, not limited by pain  Evaluation/Baseline: limited Goal status: INITIAL   4.  Andrew [name] will improve 6 minute walk test to age adjusted norm (MCID 50 m).  Evaluation/Baseline: not tested Goal status: INITIAL     5.  Andrew [name] will improve 30'' STS (MCID 2) to >/= 10x (w/ UE?: N) to show improved LE strength and improved transfers   Evaluation/Baseline: 10 tested Goal status: INITIAL   PLAN: PT FREQUENCY: 1-2x/week  PT DURATION: 8 weeks  PLANNED INTERVENTIONS:  97164- PT Re-evaluation, 97110-Therapeutic exercises, 97530- Therapeutic activity, [id number]- Neuromuscular re-education, 97535- Self Care, 02859- Manual therapy, [id number]- Gait training, [id number]- Aquatic Therapy, [phone]- Electrical stimulation (manual), [id number]- Vasopneumatic device, [id number]- Traction (mechanical), [id number]- Ionotophoresis 4mg /ml Dexamethasone , Taping, Dry Needling, Joint manipulation, and Spinal manipulation.   Helene BRAVO Arti Trang  PT 06/24/24 11:10 AM Phone: [phone] Fax: [phone]   Name: Andrew [name]  MRN: [id number] Please request 2x/week for 12 weeks.    Check all possible CPT codes: 02889- Therapeutic Exercise, [phone]- Neuro Re-education, [phone] - Gait Training, [phone] - Manual Therapy, 97530 - Therapeutic Activities, 97535 - Self Care, [phone] - Re-evaluation, [id number] - Mechanical traction, and [id number] - Aquatic therapy   Thank you!  MCD - Secure   Date of referral: 6/26 Referring provider: Genelle Referring diagnosis? Tear of right gluteus minimus tendon, initial encounter [S76.011A]  Treatment diagnosis? (if different than referring diagnosis) NA  What was this (referring dx) caused by? Surgery (Type: glute med repair)  Lysle of Condition: Initial Onset (within last 3 months)   Laterality: Rt  Current Functional Measure Score: LEFS 16/80  Objective measurements identify impairments when they are compared to normal values, the uninvolved extremity, and prior level of function.  [x]  Yes  []  No  Objective assessment of functional ability: Severe functional limitations   Briefly describe symptoms: difficulty in walking and daily tasks including self care and care for his wife  How did symptoms start: surgery  Average pain intensity:  Last 24 hours: 8  Past week: 8  How often does the pt experience symptoms? Frequently  How much have the symptoms interfered with usual daily activities? Extremely  How has condition changed since care began at this facility? NA - initial visit  In general, how is the patients overall health? Good   BACK PAIN (STarT Back Screening Tool) No

## 2024-06-25 ENCOUNTER — Ambulatory Visit: Payer: Self-pay | Admitting: Internal Medicine

## 2024-06-25 NOTE — Telephone Encounter (Signed)
 FYI Only or Action Required?: FYI only for provider.  Patient was last seen in primary care on 04/01/2024 by Geofm Glade PARAS, MD.  Called Nurse Triage reporting Leg Pain.  Symptoms began several days ago.  Triage Disposition: See Physician Within 24 Hours  Patient/caregiver understands and will follow disposition?: Yes           Copied from CRM #[id number]. Topic: Clinical - Red Word Triage >> Jun 25, 2024  1:19 PM Deleta RAMAN wrote: Red Word that prompted transfer to Nurse Triage: patient has an infection in leg. Has been experiencing for about 4-5 days states he has pain. Reason for Disposition  Looks like a boil, infected sore, deep ulcer or other infected rash (spreading redness, pus)  Answer Assessment - Initial Assessment Questions ONSET: When did the pain start?      4-5 days ago  LOCATION: Where is the pain located?     L thigh   PAIN: How bad is the pain?    (Scale 1-10; or mild, moderate, severe)     10/10 pain level most of them  CAUSE: What do you think is causing the leg pain?     Pimple on thigh was itching; pt scratched the pimple and it has become infected. Size of a dime or maybe a little smaller  Denies Red and swollen area  Protocols used: Leg Pain-A-AH

## 2024-06-26 ENCOUNTER — Encounter: Payer: Self-pay | Admitting: Internal Medicine

## 2024-06-26 ENCOUNTER — Ambulatory Visit (INDEPENDENT_AMBULATORY_CARE_PROVIDER_SITE_OTHER): Admitting: Internal Medicine

## 2024-06-26 VITALS — BP 92/56 | HR 66 | Temp 98.2°F | Ht 68.0 in | Wt 151.0 lb

## 2024-06-26 DIAGNOSIS — Z7984 Long term (current) use of oral hypoglycemic drugs: Secondary | ICD-10-CM

## 2024-06-26 DIAGNOSIS — L02416 Cutaneous abscess of left lower limb: Secondary | ICD-10-CM

## 2024-06-26 DIAGNOSIS — E1149 Type 2 diabetes mellitus with other diabetic neurological complication: Secondary | ICD-10-CM

## 2024-06-26 DIAGNOSIS — E559 Vitamin D deficiency, unspecified: Secondary | ICD-10-CM

## 2024-06-26 DIAGNOSIS — L03116 Cellulitis of left lower limb: Secondary | ICD-10-CM | POA: Insufficient documentation

## 2024-06-26 MED ORDER — HYDROCODONE-ACETAMINOPHEN 5-325 MG PO TABS: 1.0000 | ORAL_TABLET | Freq: Four times a day (QID) | ORAL | Status: DC | PRN

## 2024-06-26 MED ORDER — DOXYCYCLINE HYCLATE 100 MG PO TABS: 100.0000 mg | ORAL_TABLET | Freq: Two times a day (BID) | ORAL | 0 refills | Status: DC

## 2024-06-26 NOTE — Patient Instructions (Addendum)
 Please take all new medication as prescribed - the antibiotic  Please continue all other medications as before, including the hydrocodone  for pain as needed that you have at home  Please have the pharmacy call with any other refills you may need.  Please keep your appointments with your specialists as you may have planned  You will be contacted regarding the referral for: General Surgury - urgent

## 2024-06-26 NOTE — Assessment & Plan Note (Signed)
 Lab Results  Component Value Date   HGBA1C 6.7 (H) 04/01/2024   Stable, pt to continue current medical treatment metformin  ER 500 mg- 1 bid

## 2024-06-26 NOTE — Progress Notes (Signed)
 [name], male   DOB: 1934/06/07, 89 y.o.   MRN: [id number]        Chief Complaint: follow up left upper medial leg abscess, dm, low vit d       HPI:  Andrew [name] is a 89 y.o. male here with c/o scratching of what he though was pimple, but then rapidly worsening left upper medial leg soreness, redness and raised up swelling.  Pt denies chest pain, increased sob or doe, wheezing, orthopnea, PND, increased LE swelling, palpitations, dizziness or syncope.   Pt denies polydipsia, polyuria, or new focal neuro s/s.          Wt Readings from Last 3 Encounters:  06/26/24 151 lb (68.5 kg)  05/06/24 153 lb 3.5 oz (69.5 kg)  05/01/24 153 lb (69.4 kg)   BP Readings from Last 3 Encounters:  06/26/24 (!) 92/56  05/06/24 (!) 151/73  05/01/24 (!) 112/58         Past Medical History:  Diagnosis Date   Chronic kidney disease    CKD stg 3b   Colonic polyp 2011 last colo   colo q 52yr - [facility]    Coronary artery disease 02/2009   a. 02/2009 Cath: severe left main and three-vessel-->CABG; b. 12/2016 MVL EF 49%, no isch/infarct.   DEGENERATIVE JOINT DISEASE    Diastolic dysfunction    a. 10/2021 Echo: EF 55-60%, no rwma, mod conc LVH, GrI DD, mildly reduced RV fxn, RVSP 26.8 mmHg. mild AI, and aortic sclerosis.   Diverticulosis of colon    DIVERTICULOSIS, COLON    GERD (gastroesophageal reflux disease)    History of kidney stones    HYPERLIPIDEMIA-MIXED    HYPERTENSION, BENIGN    Interstitial lung disease (HCC)    LBP (low back pain)    s/p decompression 8/14; ESI - [place]; RFA x 3 early 2015   Pacemaker    a. 05/2017: St. Jude (serial number [id number]) pacemaker for CHB   Symptomatic bradycardia    a. s/p St. Jude (serial number [id number]) pacemaker 06/16/17   Type II or unspecified type diabetes mellitus without mention of complication, not stated as uncontrolled    VITAMIN D  DEFICIENCY    Past Surgical History:  Procedure Laterality Date   CARPAL TUNNEL RELEASE Right     CATARACT EXTRACTION, BILATERAL     R 07/07/13, L 07/21/13   CORONARY ARTERY BYPASS GRAFT  03/17/09   x 4   CYSTOSCOPY WITH STENT PLACEMENT Left 05/10/2021   Procedure: CYSTOSCOPY WITH LEFT  STENT PLACEMENT,LEFT RETROGRADE PYELOGRAM;  Surgeon: Cam Morene ORN, MD;  Location: [facility];  Service: Urology;  Laterality: Left;   CYSTOSCOPY/URETEROSCOPY/HOLMIUM LASER/STENT PLACEMENT Left 06/03/2021   Procedure: CYSTOSCOPY, LEFT RETROGRADE, URETEROSCOPY,HOLMIUM LASER,STENT EXCHANGE;  Surgeon: Cam Morene ORN, MD;  Location: [facility];  Service: Urology;  Laterality: Left;   GLUTEUS MINIMUS REPAIR Right 05/06/2024   Procedure: REPAIR, TENDON, GLUTEUS MINIMUS;  Surgeon: Genelle Standing, MD;  Location: [facility];  Service: Orthopedics;  Laterality: Right;  RIGHT GLUTEUS MAXIMUS TENDON TRANSFER   INGUINAL HERNIA REPAIR  1980   KNEE ARTHROSCOPY  2007   right   LUMBAR EPIDURAL INJECTION  2013   Has as needed.   LUMBAR LAMINECTOMY/DECOMPRESSION MICRODISCECTOMY N/A 06/19/2013   Procedure: LUMBAR LAMINECTOMY/DECOMPRESSION MICRODISCECTOMY;  Surgeon: Oneil Rodgers Priestly, MD;  Location: [facility];  Service: Orthopedics;  Laterality: N/A;  Lumbar 4-5 decompression   PACEMAKER IMPLANT N/A 06/16/2017   Procedure: Pacemaker Implant;  Surgeon: Waddell Lusher  W, MD;  Location: [facility];  Service: Cardiovascular;  Laterality: N/A;   TRIGGER FINGER RELEASE Right     reports that he has never smoked. He has never used smokeless tobacco. He reports current alcohol use. He reports that he does not use drugs. family history includes Diabetes in an other family member; Heart failure in his father and mother. Allergies  Allergen Reactions   Penicillins Other (See Comments) and Anaphylaxis    Taste buds   Current Outpatient Medications on File Prior to Visit  Medication Sig Dispense Refill   Acetaminophen  (TYLENOL  8 HOUR PO) Take 1,000 mg by mouth 3 (three) times daily.     aspirin  EC 325 MG tablet  Take 1 tablet (325 mg total) by mouth daily. 14 tablet 0   atorvastatin  (LIPITOR) 20 MG tablet TAKE 1 TABLET DAILY AT     6:00PM 90 tablet 1   carvedilol  (COREG ) 6.25 MG tablet Take 1 tablet (6.25 mg total) by mouth 2 (two) times daily with a meal. 180 tablet 3   cholecalciferol  (VITAMIN D ) 1000 UNITS tablet Take 1,000 Units by mouth daily.     DULoxetine (CYMBALTA) 30 MG capsule Take 30 mg by mouth every morning.     metFORMIN  (GLUCOPHAGE -XR) 500 MG 24 hr tablet TAKE 1 TABLET TWICE A DAY 180 tablet 3   Multiple Vitamin (MULTIVITAMIN WITH MINERALS) TABS tablet Take 1 tablet by mouth daily.     niacin  (NIASPAN ) 1000 MG CR tablet TAKE 1 TABLET AT BEDTIME 90 tablet 3   TURMERIC CURCUMIN PO Take 2,000 mg by mouth in the morning and at bedtime.     warfarin (JANTOVEN ) 2 MG tablet TAKE 1 TO 2 TABLETS DAILY AS DIRECTED BY COUMADIN  CLINIC 110 tablet 1   No current facility-administered medications on file prior to visit.        ROS:  All others reviewed and negative.  Objective        PE:  BP (!) 92/56   Pulse 66   Temp 98.2 F (36.8 C)   Ht 5' 8 (1.727 m)   Wt 151 lb (68.5 kg)   SpO2 91%   BMI 22.96 kg/m                 Constitutional: Pt appears in NAD               HENT: Head: NCAT.                Right Ear: External ear normal.                 Left Ear: External ear normal.                Eyes: . Pupils are equal, round, and reactive to light. Conjunctivae and EOM are normal               Nose: without d/c or deformity               Neck: Neck supple. Gross normal ROM               Cardiovascular: Normal rate and regular rhythm.                 Pulmonary/Chest: Effort normal and breath sounds without rales or wheezing               Left upper medial leg with 6 cm area fluctuance lesion riased, red, tender but no drainage.  Neurological: Pt is alert. At baseline orientation, motor grossly intact               Skin: Skin is warm. No rashes, no other new lesions, LE  edema - none               Psychiatric: Pt behavior is normal without agitation   Micro: none  Cardiac tracings I have personally interpreted today:  none  Pertinent Radiological findings (summarize): none   Lab Results  Component Value Date   WBC 6.9 04/01/2024   HGB 11.8 (L) 04/01/2024   HCT 35.3 (L) 04/01/2024   PLT 193.0 04/01/2024   GLUCOSE 182 (H) 05/05/2024   CHOL 137 04/01/2024   TRIG 133.0 04/01/2024   HDL 49.40 04/01/2024   LDLDIRECT 72.8 04/18/2011   LDLCALC 61 04/01/2024   ALT 18 04/01/2024   AST 17 04/01/2024   NA 140 05/05/2024   K 4.1 05/05/2024   CL 104 05/05/2024   CREATININE 1.43 (H) 05/05/2024   BUN 28 (H) 05/05/2024   CO2 25 05/05/2024   TSH 1.02 04/01/2024   PSA 1.35 04/01/2024   INR 2.0 06/11/2024   HGBA1C 6.7 (H) 04/01/2024   Assessment/Plan:  Dorris Vangorder is a 89 y.o. White or Caucasian [1] male with  has a past medical history of Chronic kidney disease, Colonic polyp (2011 last colo), Coronary artery disease (02/2009), DEGENERATIVE JOINT DISEASE, Diastolic dysfunction, Diverticulosis of colon, DIVERTICULOSIS, COLON, GERD (gastroesophageal reflux disease), History of kidney stones, HYPERLIPIDEMIA-MIXED, HYPERTENSION, BENIGN, Interstitial lung disease (HCC), LBP (low back pain), Pacemaker, Symptomatic bradycardia, Type II or unspecified type diabetes mellitus without mention of complication, not stated as uncontrolled, and VITAMIN D  DEFICIENCY.  Abscess of left leg Mild to mod, for antibx course doxycycline  100 bid, and refer general surgury urgent for I&D,,  to f/u any worsening symptoms or concerns   Type 2 diabetes mellitus with neurological manifestations, controlled (HCC) Lab Results  Component Value Date   HGBA1C 6.7 (H) 04/01/2024   Stable, pt to continue current medical treatment metformin  ER 500 mg- 1 bid   Vitamin D  deficiency Last vitamin D  Lab Results  Component Value Date   VD25OH 45.51 04/03/2022   Stable, cont oral  replacement  Followup: Return if symptoms worsen or fail to improve.  Lynwood Rush, MD 06/26/2024 9:13 AM [facility]

## 2024-06-26 NOTE — Assessment & Plan Note (Signed)
 Last vitamin D  Lab Results  Component Value Date   VD25OH 45.51 04/03/2022   Stable, cont oral replacement

## 2024-06-26 NOTE — Assessment & Plan Note (Signed)
 Mild to mod, for antibx course doxycycline  100 bid, and refer general surgury urgent for I&D,,  to f/u any worsening symptoms or concerns

## 2024-07-02 ENCOUNTER — Ambulatory Visit: Admitting: Physical Therapy

## 2024-07-02 ENCOUNTER — Encounter: Payer: Self-pay | Admitting: Physical Therapy

## 2024-07-02 DIAGNOSIS — M25551 Pain in right hip: Secondary | ICD-10-CM

## 2024-07-02 DIAGNOSIS — M5459 Other low back pain: Secondary | ICD-10-CM

## 2024-07-02 DIAGNOSIS — R2681 Unsteadiness on feet: Secondary | ICD-10-CM

## 2024-07-02 DIAGNOSIS — M6281 Muscle weakness (generalized): Secondary | ICD-10-CM

## 2024-07-02 NOTE — Therapy (Signed)
 OUTPATIENT PHYSICAL THERAPY DAILY NOTE  Patient Name: Andrew [name] MRN: [id number] DOB:November 22, 1933, 89 y.o., male Today's Date: 07/02/2024   PT End of Session - 07/02/24 1240     Visit Number 9    Date for PT Re-Evaluation 07/08/24    Progress Note Due on Visit 10    PT Start Time 1100    PT Stop Time 1146    PT Time Calculation (min) 46 min    Activity Tolerance Patient tolerated treatment well    Behavior During Therapy [facility] for tasks assessed/performed           Past Medical History:  Diagnosis Date   Chronic kidney disease    CKD stg 3b   Colonic polyp 2011 last colo   colo q 43yr - [facility]    Coronary artery disease 02/2009   a. 02/2009 Cath: severe left main and three-vessel-->CABG; b. 12/2016 MVL EF 49%, no isch/infarct.   DEGENERATIVE JOINT DISEASE    Diastolic dysfunction    a. 10/2021 Echo: EF 55-60%, no rwma, mod conc LVH, GrI DD, mildly reduced RV fxn, RVSP 26.8 mmHg. mild AI, and aortic sclerosis.   Diverticulosis of colon    DIVERTICULOSIS, COLON    GERD (gastroesophageal reflux disease)    History of kidney stones    HYPERLIPIDEMIA-MIXED    HYPERTENSION, BENIGN    Interstitial lung disease (HCC)    LBP (low back pain)    s/p decompression 8/14; ESI - [place]; RFA x 3 early 2015   Pacemaker    a. 05/2017: St. Jude (serial number [id number]) pacemaker for CHB   Symptomatic bradycardia    a. s/p St. Jude (serial number [id number]) pacemaker 06/16/17   Type II or unspecified type diabetes mellitus without mention of complication, not stated as uncontrolled    VITAMIN D  DEFICIENCY    Past Surgical History:  Procedure Laterality Date   CARPAL TUNNEL RELEASE Right    CATARACT EXTRACTION, BILATERAL     R 07/07/13, L 07/21/13   CORONARY ARTERY BYPASS GRAFT  03/17/09   x 4   CYSTOSCOPY WITH STENT PLACEMENT Left 05/10/2021   Procedure: CYSTOSCOPY WITH LEFT  STENT PLACEMENT,LEFT RETROGRADE PYELOGRAM;  Surgeon: Cam Morene ORN, MD;  Location: [facility];  Service:  Urology;  Laterality: Left;   CYSTOSCOPY/URETEROSCOPY/HOLMIUM LASER/STENT PLACEMENT Left 06/03/2021   Procedure: CYSTOSCOPY, LEFT RETROGRADE, URETEROSCOPY,HOLMIUM LASER,STENT EXCHANGE;  Surgeon: Cam Morene ORN, MD;  Location: [facility];  Service: Urology;  Laterality: Left;   GLUTEUS MINIMUS REPAIR Right 05/06/2024   Procedure: REPAIR, TENDON, GLUTEUS MINIMUS;  Surgeon: Genelle Standing, MD;  Location: [facility];  Service: Orthopedics;  Laterality: Right;  RIGHT GLUTEUS MAXIMUS TENDON TRANSFER   INGUINAL HERNIA REPAIR  1980   KNEE ARTHROSCOPY  2007   right   LUMBAR EPIDURAL INJECTION  2013   Has as needed.   LUMBAR LAMINECTOMY/DECOMPRESSION MICRODISCECTOMY N/A 06/19/2013   Procedure: LUMBAR LAMINECTOMY/DECOMPRESSION MICRODISCECTOMY;  Surgeon: Oneil Rodgers Priestly, MD;  Location[facility];  Service: Orthopedics;  Laterality: N/A;  Lumbar 4-5 decompression   PACEMAKER IMPLANT N/A 06/16/2017   Procedure: Pacemaker Implant;  Surgeon: Waddell Danelle ORN, MD;  Location: [facility] INVASIVE CV LAB;  Service: Cardiovascular;  Laterality: N/A;   TRIGGER FINGER RELEASE Right    Patient Active Problem List   Diagnosis Date Noted   Abscess of left leg 06/26/2024   Weight loss 04/01/2024   Tear of right gluteus minimus tendon 12/21/2023   Anemia 04/02/2023   Hematoma 03/18/2023   Supratherapeutic  INR 03/18/2023   Pulmonary fibrosis (HCC) 04/02/2022   Poor balance 12/28/2021   Bilateral leg weakness 04/01/2021   CKD (chronic kidney disease) stage 3, GFR 30-59 ml/min (HCC) 03/31/2021   Pacemaker 07/21/2019   History of deep venous thrombosis (DVT) of distal vein of left lower extremity 08/28/2017   Complete heart block (HCC) - PPM 06/15/2017   Wears hearing aid 09/25/2016   Degeneration of lumbar or lumbosacral intervertebral disc 03/23/2015   Vitamin D  deficiency 11/03/2010   Diverticulosis of colon 11/03/2010   Osteoarthritis (arthritis due to wear and tear of joints) 11/03/2010    Dyslipidemia, goal LDL below 70 12/01/2009   CAD, ARTERY BYPASS GRAFT 12/01/2009   DOE (dyspnea on exertion) 12/01/2009   Type 2 diabetes mellitus with neurological manifestations, controlled (HCC)     PCP: Geofm Glade PARAS, MD  REFERRING PROVIDER: Geofm Glade PARAS, MD  THERAPY DIAG:  Pain in right hip  Muscle weakness  Unsteadiness on feet  Other low back pain  REFERRING DIAG: Tear of right gluteus minimus tendon, initial encounter [S76.011A]   Rationale for Evaluation and Treatment:  Rehabilitation  SUBJECTIVE:  PERTINENT PAST HISTORY:  Right hip gluteus maximus tendon transfer and Right hip open trochanteric bursectomy 7/15 for glute med repair, DM, CAD with bypass 2017, DOE, laminectomy, pacemaker, hx of DVT, blood thinner       PRECAUTIONS: see protocol below  WEIGHT BEARING RESTRICTIONS   WBAT per OP Note  OP Date: 05/06/2024  2 weeks 05/20/2024  4 weeks 06/03/2024  6 weeks 06/17/2024  8 weeks 07/01/2024  10 weeks 07/15/2024  12 weeks 07/29/2024   Glute med protocol:   [email or web address]  FALLS:  Has patient fallen in last 6 months? No, Number of falls: 0  MOI/History of condition:  Onset date: 7/15  SUBJECTIVE STATEMENT  07/02/2024: I am having a procedure to address the pimple I scratched that has worsened and I have surgery on 07/08/2024. I am not having any pain in the hip from the surgery itself. My back is bothering me the most.   EVAL: Talin Feister is a 89 y.o. male who presents to clinic with chief complaint of chronic R hip pain and stiffness.  Well known to this PT and clinic.  Chronic glute min/med tear which was was repaired on 7/15 using glute max tendon transfer.  He has had quite a bit of pain since the surgery but this is improving.  1 dressing change with no current drainage.  Has son at home who has  been helpful.   Red flags:  denies malaise, erythema / streaking, and chills / night sweats  Pain:  Are you having pain? Yes Pain location: R hip pain NPRS scale: 3-4/10 Best: 0/10, Worst: 5/10 Aggravating factors: walking, standing  Relieving factors: rest, sitting Pain description: intermittent  Occupation: retired   Education administrator: [employer]   Hand Dominance: NA  Patient Goals/Specific Activities: reduce pain, improve ability to complete ADLs   OBJECTIVE:   GENERAL OBSERVATION/GAIT: Antalgic gait with reduced time in stance on R  LE MMT:  MMT Right (Eval) Left (Eval)  Hip flexion (L2, L3)    Knee extension (L3)    Knee flexion    Hip abduction    Hip extension    Hip external rotation    Hip internal rotation    Hip adduction    Ankle dorsiflexion (L4)    Ankle plantarflexion (S1)    Ankle inversion    Ankle eversion  Great Toe ext (L5)    Grossly     (Blank rows = not tested, score listed is out of 5 possible points.  N = WNL, D = diminished, C = clear for gross weakness with myotome testing, * = concordant pain with testing)  LE ROM:  ROM Right (Eval) Left (Eval) Right 06/09/24 8/28  Hip flexion 90 (stopped per protocol)   Easily to 100 PROM Symmetrical   Hip extension      Hip abduction      Hip adduction      Hip internal rotation      Hip external rotation      Knee extension      Knee flexion      Ankle dorsiflexion      Ankle plantarflexion      Ankle inversion      Ankle eversion       (Blank rows = not tested, N = WNL, * = concordant pain with testing)  Functional Tests  Eval    6 MWT: unable to participate d/t pain                                                        .oprct  PATIENT SURVEYS:  LEFS: 16/80   TODAY'S TREATMENT: [facility] Adult PT Treatment:                                                DATE: 07/02/24 Nu-step L5 x x 5 min LE only Standing hip abduction/ ext 2 x 10 performed bil Sit to stand 1 x 5, 2  x 5 with LLE advanced 1 inch Knee ext machine - 2 x 10 10# single leg  Knee flexion machine - 2 x 10 20#with bil LE Standing hip hikes standing on LLE with HHA from chair 2 x 10  [facility] Adult PT Treatment  06/24/2024:  Therapeutic Exercise: Bike - 6 min - L3 Knee ext machine - 10# - 3x10 Knee flexion machine - 20# - 3x10  Therapeutic Activity  Calf raises - 2x15 Standing hip abd and ext - 2x10 ea Standing march on foam - 2x10 ea Step up to foam - 3x5 ea Lateral walking at counter    HOME EXERCISE PROGRAM: Access Code: [email or web address] URL: [email or web address] Date: 06/19/2024 Prepared by: Helene Gasmen  Exercises - Standing Hip Abduction with Counter Support  - 1 x daily - 7 x weekly - 3 sets - 10 reps - Standing Hip Extension with Counter Support  - 1 x daily - 7 x weekly - 3 sets - 10 reps - Mini Squat with Counter Support  - 1 x daily - 7 x weekly - 3 sets - 10 reps - Standing Hip Flexion with Counter Support  - 1 x daily - 7 x weekly - 3 sets - 10 reps   Treatment priorities   Eval                                                  ASSESSMENT:  CLINICAL IMPRESSION:  9/10/2025BETHA Banks Arrives to session noting no pain in the hip that his issue is related to his back and the pimple he scratched on his leg that now appears to be a potential abscess which he having a procedure on 9/16 to address. Pt hit 8 weeks as of yesterday so continued working on strengthening and balance trianing which he noted difficulty with marching in place requiring utilizing 1 HHA for stability. He continues to exhibit a trendelenberg pattern with gait and demonstrates a L lateral weight shift during sit to stand exercise. End of session he noted feeling like a good workout but denied any pain.      Javelle is a 89 y.o. male who presents to clinic with signs and sxs consistent with R hip pain following glute min repair on 7/15.   Still having high pain levels but only about 1 week out  at this point.  Started with some sub max isometrics and gentle ROM.  Did not complete strength testing d/t precautions and pain.   Alanzo will benefit from skilled PT to address relevant deficits and improve ability to complete daily tasks such as walking, standing, and bending in order to care for himself and his wife.   OBJECTIVE IMPAIRMENTS: Pain, hip ROM, hip strength, gait, balance  ACTIVITY LIMITATIONS: walking, standing, bending, limiting, squatting  PERSONAL FACTORS: See medical history and pertinent history   REHAB POTENTIAL: Good  CLINICAL DECISION MAKING: Evolving/moderate complexity  EVALUATION COMPLEXITY: Moderate   GOALS:   SHORT TERM GOALS: Target date: 06/10/2024   Andrew will be >75% HEP compliant to improve carryover between sessions and facilitate independent management of condition  Evaluation: ongoing Goal status: MET   LONG TERM GOALS: Target date: 07/08/2024   Raquon will self report >/= 50% decrease in pain from evaluation to improve function in daily tasks  Evaluation/Baseline: 8/10 max pain Goal status: INITIAL   2.  Kelson will show a >/= 27 pt improvement in LEFS score (MCID is ~11% or 9 pts) as a proxy for functional improvement   Evaluation/Baseline: 16/80 pts Goal status: INITIAL   3.  Rondal will be able to complete household tasks including caring for his wife, not limited by pain  Evaluation/Baseline: limited Goal status: INITIAL   4.  Kue will improve 6 minute walk test to age adjusted norm (MCID 50 m).  Evaluation/Baseline: not tested Goal status: INITIAL     5.  Tyree will improve 30'' STS (MCID 2) to >/= 10x (w/ UE?: N) to show improved LE strength and improved transfers   Evaluation/Baseline: 10 tested Goal status: INITIAL   PLAN: PT FREQUENCY: 1-2x/week  PT DURATION: 8 weeks  PLANNED INTERVENTIONS:  97164- PT Re-evaluation, 97110-Therapeutic exercises, 97530- Therapeutic activity, [id number]- Neuromuscular  re-education, 97535- Self Care, 02859- Manual therapy, [id number]- Gait training, [id number]- Aquatic Therapy, [phone]- Electrical stimulation (manual), [id number]- Vasopneumatic device, [id number]- Traction (mechanical), [id number]- Ionotophoresis 4mg /ml Dexamethasone , Taping, Dry Needling, Joint manipulation, and Spinal manipulation.   Joneen Fresh PT, DPT, LAT, ATC  07/02/24  12:47 PM      Name: Glenard Keesling  MRN: [id number] Please request 2x/week for 12 weeks.    Check all possible CPT codes: 02889- Therapeutic Exercise, [phone]- Neuro Re-education, [phone] - Gait Training, [phone] - Manual Therapy, 97530 - Therapeutic Activities, 97535 - Self Care, [phone] - Re-evaluation, [id number] - Mechanical traction, and [id number] - Aquatic therapy   Thank you!  MCD - Secure   Date of referral: 6/26 Referring provider: Genelle Referring diagnosis? Tear of  right gluteus minimus tendon, initial encounter [S76.011A]  Treatment diagnosis? (if different than referring diagnosis) NA  What was this (referring dx) caused by? Surgery (Type: glute med repair)  Lysle of Condition: Initial Onset (within last 3 months)   Laterality: Rt  Current Functional Measure Score: LEFS 16/80  Objective measurements identify impairments when they are compared to normal values, the uninvolved extremity, and prior level of function.  [x]  Yes  []  No  Objective assessment of functional ability: Severe functional limitations   Briefly describe symptoms: difficulty in walking and daily tasks including self care and care for his wife  How did symptoms start: surgery  Average pain intensity:  Last 24 hours: 8  Past week: 8  How often does the pt experience symptoms? Frequently  How much have the symptoms interfered with usual daily activities? Extremely  How has condition changed since care began at this facility? NA - initial visit  In general, how is the patients overall health? Good   BACK PAIN (STarT Back Screening  Tool) No

## 2024-07-04 ENCOUNTER — Ambulatory Visit: Admitting: Physical Therapy

## 2024-07-04 ENCOUNTER — Telehealth: Payer: Self-pay | Admitting: Internal Medicine

## 2024-07-04 ENCOUNTER — Encounter: Payer: Self-pay | Admitting: Physical Therapy

## 2024-07-04 DIAGNOSIS — R2681 Unsteadiness on feet: Secondary | ICD-10-CM

## 2024-07-04 DIAGNOSIS — M6281 Muscle weakness (generalized): Secondary | ICD-10-CM

## 2024-07-04 DIAGNOSIS — M25551 Pain in right hip: Secondary | ICD-10-CM | POA: Diagnosis not present

## 2024-07-04 NOTE — Telephone Encounter (Unsigned)
 Copied from CRM [id number]. Topic: Clinical - Medication Refill >> Jul 04, 2024 11:08 AM Vena HERO wrote: Pt states he has enough pills until Saturday and will run out. States he has surgery on Tuesday and would like to know if he should continue taking the medication because he will need a refill OR should he not take it until after surgery. Sending refill request just in case  Medication: doxycycline  (VIBRA -TABS) 100 MG tablet  Has the patient contacted their pharmacy? No (Agent: If no, request that the patient contact the pharmacy for the refill. If patient does not wish to contact the pharmacy document the reason why and proceed with request.) (Agent: If yes, when and what did the pharmacy advise?)  This is the patient's preferred pharmacy:  [facility] [address] [place], [place] - [address] [place] [address] Phone: [phone] Fax: [phone]   Is this the correct pharmacy for this prescription? Yes If no, delete pharmacy and type the correct one.   Has the prescription been filled recently? Yes  Is the patient out of the medication? No  Has the patient been seen for an appointment in the last year OR does the patient have an upcoming appointment? Yes  Can we respond through MyChart? No  Agent: Please be advised that Rx refills may take up to 3 business days. We ask that you follow-up with your pharmacy.

## 2024-07-04 NOTE — Therapy (Signed)
 Progress Note Reporting Period 7/22 to 9/12  See note below for Objective Data and Assessment of Progress/Goals.     Patient Name: Andrew [name] MRN: [id number] DOB:July 16, 1934, 89 y.o., male Today's Date: 07/04/2024   PT End of Session - 07/04/24 0930     Visit Number 10    Number of Visits --   1-2x/week   Date for PT Re-Evaluation 08/29/24    Authorization Type [employer] MCR - LEFS    Authorization Time Period submitted 9/12    Authorization - Number of Visits 12    Progress Note Due on Visit 20    PT Start Time 0930    PT Stop Time 1012    PT Time Calculation (min) 42 min           Past Medical History:  Diagnosis Date   Chronic kidney disease    CKD stg 3b   Colonic polyp 2011 last colo   colo q 64yr - [facility]    Coronary artery disease 02/2009   a. 02/2009 Cath: severe left main and three-vessel-->CABG; b. 12/2016 MVL EF 49%, no isch/infarct.   DEGENERATIVE JOINT DISEASE    Diastolic dysfunction    a. 10/2021 Echo: EF 55-60%, no rwma, mod conc LVH, GrI DD, mildly reduced RV fxn, RVSP 26.8 mmHg. mild AI, and aortic sclerosis.   Diverticulosis of colon    DIVERTICULOSIS, COLON    GERD (gastroesophageal reflux disease)    History of kidney stones    HYPERLIPIDEMIA-MIXED    HYPERTENSION, BENIGN    Interstitial lung disease (HCC)    LBP (low back pain)    s/p decompression 8/14; ESI - [place]; RFA x 3 early 2015   Pacemaker    a. 05/2017: St. Jude (serial number [id number]) pacemaker for CHB   Symptomatic bradycardia    a. s/p St. Jude (serial number [id number]) pacemaker 06/16/17   Type II or unspecified type diabetes mellitus without mention of complication, not stated as uncontrolled    VITAMIN D  DEFICIENCY    Past Surgical History:  Procedure Laterality Date   CARPAL TUNNEL RELEASE Right    CATARACT EXTRACTION, BILATERAL     R 07/07/13, L 07/21/13   CORONARY ARTERY BYPASS GRAFT  03/17/09   x 4   CYSTOSCOPY WITH STENT PLACEMENT Left 05/10/2021   Procedure: CYSTOSCOPY  WITH LEFT  STENT PLACEMENT,LEFT RETROGRADE PYELOGRAM;  Surgeon: Cam Morene ORN, MD;  Location: [facility];  Service: Urology;  Laterality: Left;   CYSTOSCOPY/URETEROSCOPY/HOLMIUM LASER/STENT PLACEMENT Left 06/03/2021   Procedure: CYSTOSCOPY, LEFT RETROGRADE, URETEROSCOPY,HOLMIUM LASER,STENT EXCHANGE;  Surgeon: Cam Morene ORN, MD;  Location: [facility];  Service: Urology;  Laterality: Left;   GLUTEUS MINIMUS REPAIR Right 05/06/2024   Procedure: REPAIR, TENDON, GLUTEUS MINIMUS;  Surgeon: Genelle Standing, MD;  Location: [facility];  Service: Orthopedics;  Laterality: Right;  RIGHT GLUTEUS MAXIMUS TENDON TRANSFER   INGUINAL HERNIA REPAIR  1980   KNEE ARTHROSCOPY  2007   right   LUMBAR EPIDURAL INJECTION  2013   Has as needed.   LUMBAR LAMINECTOMY/DECOMPRESSION MICRODISCECTOMY N/A 06/19/2013   Procedure: LUMBAR LAMINECTOMY/DECOMPRESSION MICRODISCECTOMY;  Surgeon: Oneil Rodgers Priestly, MD;  Location: [facility];  Service: Orthopedics;  Laterality: N/A;  Lumbar 4-5 decompression   PACEMAKER IMPLANT N/A 06/16/2017   Procedure: Pacemaker Implant;  Surgeon: Waddell Danelle ORN, MD;  Location: [facility] INVASIVE CV [facility];  Service: Cardiovascular;  Laterality: N/A;   TRIGGER FINGER RELEASE Right    Patient Active Problem List   Diagnosis Date Noted  Abscess of left leg 06/26/2024   Weight loss 04/01/2024   Tear of right gluteus minimus tendon 12/21/2023   Anemia 04/02/2023   Hematoma 03/18/2023   Supratherapeutic INR 03/18/2023   Pulmonary fibrosis (HCC) 04/02/2022   Poor balance 12/28/2021   Bilateral leg weakness 04/01/2021   CKD (chronic kidney disease) stage 3, GFR 30-59 ml/min (HCC) 03/31/2021   Pacemaker 07/21/2019   History of deep venous thrombosis (DVT) of distal vein of left lower extremity 08/28/2017   Complete heart block (HCC) - PPM 06/15/2017   Wears hearing aid 09/25/2016   Degeneration of lumbar or lumbosacral intervertebral disc 03/23/2015   Vitamin D  deficiency 11/03/2010    Diverticulosis of colon 11/03/2010   Osteoarthritis (arthritis due to wear and tear of joints) 11/03/2010   Dyslipidemia, goal LDL below 70 12/01/2009   CAD, ARTERY BYPASS GRAFT 12/01/2009   DOE (dyspnea on exertion) 12/01/2009   Type 2 diabetes mellitus with neurological manifestations, controlled (HCC)     PCP: Geofm Glade PARAS, MD  REFERRING PROVIDER: Geofm Glade PARAS, MD  THERAPY DIAG:  Pain in right hip - Plan: PT plan of care cert/re-cert  Muscle weakness - Plan: PT plan of care cert/re-cert  Unsteadiness on feet - Plan: PT plan of care cert/re-cert  REFERRING DIAG: Tear of right gluteus minimus tendon, initial encounter [S76.011A]   Rationale for Evaluation and Treatment:  Rehabilitation  SUBJECTIVE:  PERTINENT PAST HISTORY:  Right hip gluteus maximus tendon transfer and Right hip open trochanteric bursectomy 7/15 for glute med repair, DM, CAD with bypass 2017, DOE, laminectomy, pacemaker, hx of DVT, blood thinner       PRECAUTIONS: see protocol below  WEIGHT BEARING RESTRICTIONS   WBAT per OP Note  OP Date: 05/06/2024  2 weeks 05/20/2024  4 weeks 06/03/2024  6 weeks 06/17/2024  8 weeks 07/01/2024  10 weeks 07/15/2024  12 weeks 07/29/2024   Glute med protocol:   [email or web address]  FALLS:  Has patient fallen in last 6 months? No, Number of falls: 0  MOI/History of condition:  Onset date: 7/15  SUBJECTIVE STATEMENT  07/04/2024: Pt reports good progress with PT.  He is having minimal R lateral hip pain which he rates on average 2/10.  Feels the R hip is still week.  Having a procedure for an abscess next week and a lumbar injection the following week.    EVAL: [name] is a 89 y.o. male who presents to clinic with chief complaint of chronic R hip pain and stiffness.  Well known to this PT and clinic.  Chronic  glute min/med tear which was was repaired on 7/15 using glute max tendon transfer.  He has had quite a bit of pain since the surgery but this is improving.  1 dressing change with no current drainage.  Has son at home who has been helpful.   Red flags:  denies malaise, erythema / streaking, and chills / night sweats  Pain:  Are you having pain? Yes Pain location: R hip pain NPRS scale: 3-4/10 Best: 0/10, Worst: 4/10 Aggravating factors: walking, standing  Relieving factors: rest, sitting Pain description: intermittent  Occupation: retired   Education administrator: [employer]   Hand Dominance: NA  Patient Goals/Specific Activities: reduce pain, improve ability to complete ADLs   OBJECTIVE:   GENERAL OBSERVATION/GAIT: Antalgic gait with reduced time in stance on R  LE MMT:  MMT Right (Eval) Left (Eval)  Hip flexion (L2, L3)    Knee extension (L3)    Knee flexion  Hip abduction    Hip extension    Hip external rotation    Hip internal rotation    Hip adduction    Ankle dorsiflexion (L4)    Ankle plantarflexion (S1)    Ankle inversion    Ankle eversion    Great Toe ext (L5)    Grossly     (Blank rows = not tested, score listed is out of 5 possible points.  N = WNL, D = diminished, C = clear for gross weakness with myotome testing, * = concordant pain with testing)  LE ROM:  ROM Right (Eval) Left (Eval) Right 06/09/24 8/28  Hip flexion 90 (stopped per protocol)   Easily to 100 PROM Symmetrical   Hip extension      Hip abduction      Hip adduction      Hip internal rotation      Hip external rotation      Knee extension      Knee flexion      Ankle dorsiflexion      Ankle plantarflexion      Ankle inversion      Ankle eversion       (Blank rows = not tested, N = WNL, * = concordant pain with testing)  Functional Tests  Eval    6 MWT: unable to participate d/t pain                                                        .oprct  PATIENT SURVEYS:   LEFS: 16/80   TODAY'S TREATMENT:  [facility] Adult PT Treatment  07/04/2024:  Therapeutic Exercise: Bike - 6 min - L3 Knee ext machine - 10# - 2x10 - SL Knee flexion machine - 20# - 3x10  Therapeutic Activity  Calf raises - 2x15 Standing hip 3 way abd and ext - 2x10 ea - mirror for visual feedback Checking and updating goals including 6 MWT; reviewing with pt  [facility] Adult PT Treatment:                                                DATE: 07/02/24 Nu-step L5 x x 5 min LE only Standing hip abduction/ ext 2 x 10 performed bil Sit to stand 1 x 5, 2 x 5 with LLE advanced 1 inch Knee ext machine - 2 x 10 10# single leg  Knee flexion machine - 2 x 10 20#with bil LE Standing hip hikes standing on LLE with HHA from chair 2 x 10  [facility] Adult PT Treatment  06/24/2024:  Therapeutic Exercise: Bike - 6 min - L3 Knee ext machine - 10# - 3x10 Knee flexion machine - 20# - 3x10  Therapeutic Activity  Calf raises - 2x15 Standing hip abd and ext - 2x10 ea Standing march on foam - 2x10 ea Step up to foam - 3x5 ea Lateral walking at counter    HOME EXERCISE PROGRAM: Access Code: [email or web address] URL: [email or web address] Date: 06/19/2024 Prepared by: Helene Gasmen  Exercises - Standing Hip Abduction with Counter Support  - 1 x daily - 7 x weekly - 3 sets - 10 reps - Standing Hip Extension with Counter Support  - 1 x  daily - 7 x weekly - 3 sets - 10 reps - Mini Squat with Counter Support  - 1 x daily - 7 x weekly - 3 sets - 10 reps - Standing Hip Flexion with Counter Support  - 1 x daily - 7 x weekly - 3 sets - 10 reps   Treatment priorities   Eval                                                  ASSESSMENT:  CLINICAL IMPRESSION:  07/04/2024: Upon goal recheck pt has made good progress toward all short and long term goals.  He is having minimal R hip pain at this point.  He does still show both isolated and functional lateral hip weakness with significant R trendelenburg  during gait.  Unfortunately, he has several other issues going on including chronic low back pain and an infected wound which make more vigorous standing exercises more challenging today.  He has appts to address both of these issues in the next 2 weeks which should allow more comfortable loading of R hip during w/b to address gait deviations.    Johnnie is a 89 y.o. male who presents to clinic with signs and sxs consistent with R hip pain following glute min repair on 7/15.   Still having high pain levels but only about 1 week out at this point.  Started with some sub max isometrics and gentle ROM.  Did not complete strength testing d/t precautions and pain.   Ahmani will benefit from skilled PT to address relevant deficits and improve ability to complete daily tasks such as walking, standing, and bending in order to care for himself and his wife.   OBJECTIVE IMPAIRMENTS: Pain, hip ROM, hip strength, gait, balance  ACTIVITY LIMITATIONS: walking, standing, bending, limiting, squatting  PERSONAL FACTORS: See medical history and pertinent history   REHAB POTENTIAL: Good  CLINICAL DECISION MAKING: Evolving/moderate complexity  EVALUATION COMPLEXITY: Moderate   GOALS:   SHORT TERM GOALS: Target date: 06/10/2024   Machi will be >75% HEP compliant to improve carryover between sessions and facilitate independent management of condition  Evaluation: ongoing Goal status: MET   LONG TERM GOALS: Target date: 07/08/2024 extended to 08/29/2024    Leontae will self report >/= 50% decrease in pain from evaluation to improve function in daily tasks  Evaluation/Baseline: 8/10 max pain 9/12: 2/10 Goal status: MET   2.  Cashus will show a >/= 27 pt improvement in LEFS score (MCID is ~11% or 9 pts) as a proxy for functional improvement   Evaluation/Baseline: 16/80 pts 9/12: 34/80 Goal status: ongoing   3.  Tallan will be able to complete household tasks including caring for his wife, not limited  by pain  Evaluation/Baseline: limited 9/12: Significantly improved, but still limited in more vigorous tasks like taking trash to road Goal status: ONGOING  4.  Livio will improve 6 minute walk test to age adjusted norm (MCID 50 m).  Evaluation/Baseline: not tested 9/12: 630 ft - significant trendelenburg  Goal status: INITIAL     5.  Eulon will improve 30'' STS (MCID 2) to >/= 10x (w/ UE?: N) to show improved LE strength and improved transfers   Evaluation/Baseline: not tested 9/12: 5x w/ UE Goal status: Ongoing   PLAN: PT FREQUENCY: 1-2x/week  PT DURATION: 8 weeks  PLANNED INTERVENTIONS:  02835- PT Re-evaluation, 97110-Therapeutic exercises, 97530- Therapeutic activity, [id number]- Neuromuscular re-education, 97535- Self Care, 02859- Manual therapy, [id number]- Gait training, [id number]- Aquatic Therapy, [phone]- Electrical stimulation (manual), [id number]- Vasopneumatic device, [id number]- Traction (mechanical), [id number]- Ionotophoresis 4mg /ml Dexamethasone , Taping, Dry Needling, Joint manipulation, and Spinal manipulation.   Helene BRAVO Saran Laviolette PT 07/04/24  11:19 AM      Name: Aloysuis Ribaudo  MRN: [id number] 12 visits    Check all possible CPT codes: 02889- Therapeutic Exercise, [phone]- Neuro Re-education, [phone] - Gait Training, [phone] - Manual Therapy, 97530 - Therapeutic Activities, 97535 - Self Care, [phone] - Re-evaluation, [id number] - Mechanical traction, and [id number] - Aquatic therapy   Thank you!  MCD - Secure   Date of referral: 6/26 Referring provider: Genelle Referring diagnosis? Tear of right gluteus minimus tendon, initial encounter [S76.011A]  Treatment diagnosis? (if different than referring diagnosis) NA  What was this (referring dx) caused by? Surgery (Type: glute med repair)  Lysle of Condition: Initial Onset (within last 3 months)   Laterality: Rt  Current Functional Measure Score: LEFS: 34/80  Objective measurements identify impairments when they are compared to  normal values, the uninvolved extremity, and prior level of function.  [x]  Yes  []  No  Objective assessment of functional ability: Moderate functional limitations   Briefly describe symptoms: difficulty in walking and daily tasks including self care and care for his wife  How did symptoms start: surgery  Average pain intensity:  Last 24 hours: 3-3/10  Past week: 2-3/10  How often does the pt experience symptoms? Frequently  How much have the symptoms interfered with usual daily activities? Moderately  How has condition changed since care began at this facility? Better  In general, how is the patients overall health? Fair   BACK PAIN (STarT Back Screening Tool) No

## 2024-07-04 NOTE — Telephone Encounter (Signed)
 FYI Only or Action Required?: Action required by provider: medication refill request, clinical question for provider, and requesting call back instead of my chart message.  Patient was last seen in primary care on 06/26/2024 by Norleen Lynwood ORN, MD.  Called Nurse Triage reporting Medication Problem.  Symptoms began see hx . Reports swelling still noted to abscess site in leg. if Interventions attempted: Prescription medications: doxycycline .  Symptoms are: unchanged.  Triage Disposition: No disposition on file.  Patient/caregiver understands and will follow disposition?: will continue to take last 2 doses of doxycycline  tonight and tomorrow morning.    Surgery scheduled for Tuesday per patient. Please advise.

## 2024-07-07 ENCOUNTER — Other Ambulatory Visit: Payer: Self-pay | Admitting: *Deleted

## 2024-07-07 ENCOUNTER — Other Ambulatory Visit: Payer: Self-pay | Admitting: Cardiovascular Disease

## 2024-07-07 DIAGNOSIS — I82409 Acute embolism and thrombosis of unspecified deep veins of unspecified lower extremity: Secondary | ICD-10-CM

## 2024-07-07 MED ORDER — WARFARIN SODIUM 2 MG PO TABS: ORAL_TABLET | ORAL | 1 refills | Status: DC

## 2024-07-07 NOTE — Telephone Encounter (Signed)
 Jantoven /warfarin 2mg  refill DVT Last INR 06/11/24 Last OV 7/10/2     Sent earlier and prescription failed, resending

## 2024-07-07 NOTE — Telephone Encounter (Signed)
 Jantoven /warfarin 2mg  refill DVT Last INR 06/11/24 Last OV 05/01/24

## 2024-07-08 ENCOUNTER — Ambulatory Visit: Admitting: Physical Therapy

## 2024-07-10 ENCOUNTER — Encounter: Payer: Self-pay | Admitting: Physical Therapy

## 2024-07-10 ENCOUNTER — Ambulatory Visit: Admitting: Physical Therapy

## 2024-07-10 DIAGNOSIS — R2681 Unsteadiness on feet: Secondary | ICD-10-CM

## 2024-07-10 DIAGNOSIS — M6281 Muscle weakness (generalized): Secondary | ICD-10-CM

## 2024-07-10 DIAGNOSIS — M25551 Pain in right hip: Secondary | ICD-10-CM

## 2024-07-10 DIAGNOSIS — M5459 Other low back pain: Secondary | ICD-10-CM

## 2024-07-10 NOTE — Therapy (Signed)
 DAILY NOTE    Patient Name: Andrew [name] MRN: [id number] DOB:03-25-1934, 89 y.o., male Today's Date: 07/10/2024   PT End of Session - 07/10/24 0929     Visit Number 11    Number of Visits 18   1-2x/week   Date for Recertification  08/29/24    Authorization Type [employer] MCR - LEFS    Authorization Time Period Approved 8 PT visits from 07/07/24-08/18/24    Authorization - Visit Number 1    Authorization - Number of Visits 8    Progress Note Due on Visit 20    PT Start Time 0930    PT Stop Time 1012    PT Time Calculation (min) 42 min           Past Medical History:  Diagnosis Date   Chronic kidney disease    CKD stg 3b   Colonic polyp 2011 last colo   colo q 98yr - [facility]    Coronary artery disease 02/2009   a. 02/2009 Cath: severe left main and three-vessel-->CABG; b. 12/2016 MVL EF 49%, no isch/infarct.   DEGENERATIVE JOINT DISEASE    Diastolic dysfunction    a. 10/2021 Echo: EF 55-60%, no rwma, mod conc LVH, GrI DD, mildly reduced RV fxn, RVSP 26.8 mmHg. mild AI, and aortic sclerosis.   Diverticulosis of colon    DIVERTICULOSIS, COLON    GERD (gastroesophageal reflux disease)    History of kidney stones    HYPERLIPIDEMIA-MIXED    HYPERTENSION, BENIGN    Interstitial lung disease (HCC)    LBP (low back pain)    s/p decompression 8/14; ESI - [place]; RFA x 3 early 2015   Pacemaker    a. 05/2017: St. Jude (serial number [id number]) pacemaker for CHB   Symptomatic bradycardia    a. s/p St. Jude (serial number [id number]) pacemaker 06/16/17   Type II or unspecified type diabetes mellitus without mention of complication, not stated as uncontrolled    VITAMIN D  DEFICIENCY    Past Surgical History:  Procedure Laterality Date   CARPAL TUNNEL RELEASE Right    CATARACT EXTRACTION, BILATERAL     R 07/07/13, L 07/21/13   CORONARY ARTERY BYPASS GRAFT  03/17/09   x 4   CYSTOSCOPY WITH STENT PLACEMENT Left 05/10/2021   Procedure: CYSTOSCOPY WITH LEFT  STENT PLACEMENT,LEFT RETROGRADE  PYELOGRAM;  Surgeon: Cam Morene ORN, MD;  Location: [facility];  Service: Urology;  Laterality: Left;   CYSTOSCOPY/URETEROSCOPY/HOLMIUM LASER/STENT PLACEMENT Left 06/03/2021   Procedure: CYSTOSCOPY, LEFT RETROGRADE, URETEROSCOPY,HOLMIUM LASER,STENT EXCHANGE;  Surgeon: Cam Morene ORN, MD;  Location: [facility];  Service: Urology;  Laterality: Left;   GLUTEUS MINIMUS REPAIR Right 05/06/2024   Procedure: REPAIR, TENDON, GLUTEUS MINIMUS;  Surgeon: Genelle Standing, MD;  Location: [facility];  Service: Orthopedics;  Laterality: Right;  RIGHT GLUTEUS MAXIMUS TENDON TRANSFER   INGUINAL HERNIA REPAIR  1980   KNEE ARTHROSCOPY  2007   right   LUMBAR EPIDURAL INJECTION  2013   Has as needed.   LUMBAR LAMINECTOMY/DECOMPRESSION MICRODISCECTOMY N/A 06/19/2013   Procedure: LUMBAR LAMINECTOMY/DECOMPRESSION MICRODISCECTOMY;  Surgeon: Oneil Rodgers Priestly, MD;  Location: [facility];  Service: Orthopedics;  Laterality: N/A;  Lumbar 4-5 decompression   PACEMAKER IMPLANT N/A 06/16/2017   Procedure: Pacemaker Implant;  Surgeon: Waddell Danelle ORN, MD;  Location[facility] INVASIVE CV LAB;  Service: Cardiovascular;  Laterality: N/A;   TRIGGER FINGER RELEASE Right    Patient Active Problem List   Diagnosis Date Noted   Abscess of left  leg 06/26/2024   Weight loss 04/01/2024   Tear of right gluteus minimus tendon 12/21/2023   Anemia 04/02/2023   Hematoma 03/18/2023   Supratherapeutic INR 03/18/2023   Pulmonary fibrosis (HCC) 04/02/2022   Poor balance 12/28/2021   Bilateral leg weakness 04/01/2021   CKD (chronic kidney disease) stage 3, GFR 30-59 ml/min (HCC) 03/31/2021   Pacemaker 07/21/2019   History of deep venous thrombosis (DVT) of distal vein of left lower extremity 08/28/2017   Complete heart block (HCC) - PPM 06/15/2017   Wears hearing aid 09/25/2016   Degeneration of lumbar or lumbosacral intervertebral disc 03/23/2015   Vitamin D  deficiency 11/03/2010   Diverticulosis of colon 11/03/2010    Osteoarthritis (arthritis due to wear and tear of joints) 11/03/2010   Dyslipidemia, goal LDL below 70 12/01/2009   CAD, ARTERY BYPASS GRAFT 12/01/2009   DOE (dyspnea on exertion) 12/01/2009   Type 2 diabetes mellitus with neurological manifestations, controlled (HCC)     PCP: Geofm Glade PARAS, MD  REFERRING PROVIDER: Genelle Standing, MD  THERAPY DIAG:  Pain in right hip  Muscle weakness  Unsteadiness on feet  Other low back pain  REFERRING DIAG: Tear of right gluteus minimus tendon, initial encounter [S76.011A]   Rationale for Evaluation and Treatment:  Rehabilitation  SUBJECTIVE:  PERTINENT PAST HISTORY:  Right hip gluteus maximus tendon transfer and Right hip open trochanteric bursectomy 7/15 for glute med repair, DM, CAD with bypass 2017, DOE, laminectomy, pacemaker, hx of DVT, blood thinner       PRECAUTIONS: see protocol below  WEIGHT BEARING RESTRICTIONS   WBAT per OP Note  OP Date: 05/06/2024  2 weeks 05/20/2024  4 weeks 06/03/2024  6 weeks 06/17/2024  8 weeks 07/01/2024  10 weeks 07/15/2024  12 weeks 07/29/2024   Glute med protocol:   [email or web address]  FALLS:  Has patient fallen in last 6 months? No, Number of falls: 0  MOI/History of condition:  Onset date: 7/15  SUBJECTIVE STATEMENT  07/10/2024: Pt reports that he saw MD for wound.  Diagnosed with cellulitis and started on new antibiotic and is seeing improvement.  No hip pain.  L4-L5 injection scheduled for Monday.  EVAL: Andrew [name] is a 89 y.o. male who presents to clinic with chief complaint of chronic R hip pain and stiffness.  Well known to this PT and clinic.  Chronic glute min/med tear which was was repaired on 7/15 using glute max tendon transfer.  He has had quite a bit of pain since the surgery but this is improving.  1 dressing change with no  current drainage.  Has son at home who has been helpful.   Red flags:  denies malaise, erythema / streaking, and chills / night sweats  Pain:  Are you having pain? Yes Pain location: R hip pain NPRS scale: 0/10 Aggravating factors: walking, standing  Relieving factors: rest, sitting Pain description: intermittent  Occupation: retired   Education administrator: [employer]   Hand Dominance: NA  Patient Goals/Specific Activities: reduce pain, improve ability to complete ADLs   OBJECTIVE:   GENERAL OBSERVATION/GAIT: Antalgic gait with reduced time in stance on R  LE MMT:  MMT Right (Eval) Left (Eval)  Hip flexion (L2, L3)    Knee extension (L3)    Knee flexion    Hip abduction    Hip extension    Hip external rotation    Hip internal rotation    Hip adduction    Ankle dorsiflexion (L4)    Ankle plantarflexion (S1)  Ankle inversion    Ankle eversion    Great Toe ext (L5)    Grossly     (Blank rows = not tested, score listed is out of 5 possible points.  N = WNL, D = diminished, C = clear for gross weakness with myotome testing, * = concordant pain with testing)  LE ROM:  ROM Right (Eval) Left (Eval) Right 06/09/24 8/28  Hip flexion 90 (stopped per protocol)   Easily to 100 PROM Symmetrical   Hip extension      Hip abduction      Hip adduction      Hip internal rotation      Hip external rotation      Knee extension      Knee flexion      Ankle dorsiflexion      Ankle plantarflexion      Ankle inversion      Ankle eversion       (Blank rows = not tested, N = WNL, * = concordant pain with testing)  Functional Tests  Eval    6 MWT: unable to participate d/t pain                                                        .oprct  PATIENT SURVEYS:  LEFS: 16/80   TODAY'S TREATMENT:  [facility] Adult PT Treatment  07/10/2024:  Therapeutic Exercise: Bike - 6 min - L3 Knee ext machine - 10# - 2x10 - SL Knee flexion machine - 20# - 3x10  Therapeutic  Activity  Standing hip 3 way abd and ext - 2x10 ea - mirror for visual feedback - attempted R UE support only but unable to maintain neutral pelvis d/t LBP and R hip weakness Slow march with concentration on neutral pelvis - bil UE support Hip hike on step with mirror - 2'' step - requires bil UE support Hip abd circles - 10x ea bil   [facility] Adult PT Treatment  07/04/2024:  Therapeutic Exercise: Bike - 6 min - L3 Knee ext machine - 10# - 2x10 - SL Knee flexion machine - 20# - 3x10  Therapeutic Activity  Calf raises - 2x15 Standing hip 3 way abd and ext - 2x10 ea - mirror for visual feedback Checking and updating goals including 6 MWT; reviewing with pt  [facility] Adult PT Treatment:                                                DATE: 07/02/24 Nu-step L5 x x 5 min LE only Standing hip abduction/ ext 2 x 10 performed bil Sit to stand 1 x 5, 2 x 5 with LLE advanced 1 inch Knee ext machine - 2 x 10 10# single leg  Knee flexion machine - 2 x 10 20#with bil LE Standing hip hikes standing on LLE with HHA from chair 2 x 10  [facility] Adult PT Treatment  06/24/2024:  Therapeutic Exercise: Bike - 6 min - L3 Knee ext machine - 10# - 3x10 Knee flexion machine - 20# - 3x10  Therapeutic Activity  Calf raises - 2x15 Standing hip abd and ext - 2x10 ea Standing march on foam - 2x10 ea  Step up to foam - 3x5 ea Lateral walking at counter    HOME EXERCISE PROGRAM: Access Code: [email or web address] URL: [email or web address] Date: 06/19/2024 Prepared by: Helene Gasmen  Exercises - Standing Hip Abduction with Counter Support  - 1 x daily - 7 x weekly - 3 sets - 10 reps - Standing Hip Extension with Counter Support  - 1 x daily - 7 x weekly - 3 sets - 10 reps - Mini Squat with Counter Support  - 1 x daily - 7 x weekly - 3 sets - 10 reps - Standing Hip Flexion with Counter Support  - 1 x daily - 7 x weekly - 3 sets - 10 reps   Treatment priorities   Eval 9/18        Hip abd strength cc/oc         L foot drop                                 ASSESSMENT:  CLINICAL IMPRESSION:  07/10/2024: Pt reports less pain associated with cellulitis today.  He continues to demonstrate significant functional weakness of R hip abductors and is unable to maintain neutral pelvis in R SLS without significant L UE support.  He is able to complete R hip abd OC and I encouraged him to work on this to fatigue 1-2x/day at home.  Scheduled for lumbar injection Monday which will hopefully improve comfort with standing activities.    Andrew [name] is a 89 y.o. male who presents to clinic with signs and sxs consistent with R hip pain following glute min repair on 7/15.   Still having high pain levels but only about 1 week out at this point.  Started with some sub max isometrics and gentle ROM.  Did not complete strength testing d/t precautions and pain.   Andrew [name] will benefit from skilled PT to address relevant deficits and improve ability to complete daily tasks such as walking, standing, and bending in order to care for himself and his wife.   OBJECTIVE IMPAIRMENTS: Pain, hip ROM, hip strength, gait, balance  ACTIVITY LIMITATIONS: walking, standing, bending, limiting, squatting  PERSONAL FACTORS: See medical history and pertinent history   REHAB POTENTIAL: Good  CLINICAL DECISION MAKING: Evolving/moderate complexity  EVALUATION COMPLEXITY: Moderate   GOALS:   SHORT TERM GOALS: Target date: 06/10/2024   Andrew [name] will be >75% HEP compliant to improve carryover between sessions and facilitate independent management of condition  Evaluation: ongoing Goal status: MET   LONG TERM GOALS: Target date: 07/08/2024 extended to 08/29/2024    Andrew [name] will self report >/= 50% decrease in pain from evaluation to improve function in daily tasks  Evaluation/Baseline: 8/10 max pain 9/12: 2/10 Goal status: MET   2.  Andrew [name] will show a >/= 27 pt improvement in LEFS score (MCID is ~11% or 9 pts) as a proxy for  functional improvement   Evaluation/Baseline: 16/80 pts 9/12: 34/80 Goal status: ongoing   3.  Andrew [name] will be able to complete household tasks including caring for his wife, not limited by pain  Evaluation/Baseline: limited 9/12: Significantly improved, but still limited in more vigorous tasks like taking trash to road Goal status: ONGOING  4.  Andrew [name] will improve 6 minute walk test to age adjusted norm (MCID 50 m).  Evaluation/Baseline: not tested 9/12: 630 ft - significant trendelenburg  Goal status: INITIAL     5.  Andrew [name] will improve 30'' STS (MCID 2) to >/=  10x (w/ UE?: N) to show improved LE strength and improved transfers   Evaluation/Baseline: not tested 9/12: 5x w/ UE Goal status: Ongoing   PLAN: PT FREQUENCY: 1-2x/week  PT DURATION: 8 weeks  PLANNED INTERVENTIONS:  97164- PT Re-evaluation, 97110-Therapeutic exercises, 97530- Therapeutic activity, 97112- Neuromuscular re-education, 97535- Self Care, 02859- Manual therapy, [id number]- Gait training, [id number]- Aquatic Therapy, [id number]- Electrical stimulation (manual), [id number]- Vasopneumatic device, [id number]- Traction (mechanical), [id number]- Ionotophoresis 4mg /ml Dexamethasone , Taping, Dry Needling, Joint manipulation, and Spinal manipulation.   Helene BRAVO Artasia Thang PT 07/10/24  10:12 AM      Name: Keyshun Elpers  MRN: [id number] 12 visits    Check all possible CPT codes: 02889- Therapeutic Exercise, [phone]- Neuro Re-education, [phone] - Gait Training, [phone] - Manual Therapy, 97530 - Therapeutic Activities, 97535 - Self Care, [phone] - Re-evaluation, [id number] - Mechanical traction, and [id number] - Aquatic therapy   Thank you!  MCD - Secure   Date of referral: 6/26 Referring provider: Genelle Referring diagnosis? Tear of right gluteus minimus tendon, initial encounter [S76.011A]  Treatment diagnosis? (if different than referring diagnosis) NA  What was this (referring dx) caused by? Surgery (Type: glute med repair)  Lysle of  Condition: Initial Onset (within last 3 months)   Laterality: Rt  Current Functional Measure Score: LEFS: 34/80  Objective measurements identify impairments when they are compared to normal values, the uninvolved extremity, and prior level of function.  [x]  Yes  []  No  Objective assessment of functional ability: Moderate functional limitations   Briefly describe symptoms: difficulty in walking and daily tasks including self care and care for his wife  How did symptoms start: surgery  Average pain intensity:  Last 24 hours: 3-3/10  Past week: 2-3/10  How often does the pt experience symptoms? Frequently  How much have the symptoms interfered with usual daily activities? Moderately  How has condition changed since care began at this facility? Better  In general, how is the patients overall health? Fair   BACK PAIN (STarT Back Screening Tool) No

## 2024-07-11 ENCOUNTER — Other Ambulatory Visit: Payer: Self-pay

## 2024-07-11 ENCOUNTER — Telehealth: Payer: Self-pay | Admitting: Cardiovascular Disease

## 2024-07-11 DIAGNOSIS — I82409 Acute embolism and thrombosis of unspecified deep veins of unspecified lower extremity: Secondary | ICD-10-CM

## 2024-07-11 MED ORDER — WARFARIN SODIUM 2 MG PO TABS: ORAL_TABLET | ORAL | 1 refills | Status: AC

## 2024-07-11 NOTE — Telephone Encounter (Signed)
*  STAT* If patient is at the pharmacy, call can be transferred to refill team.   1. Which medications need to be refilled? (please list name of each medication and dose if known)  warfarin (JANTOVEN ) 2 MG tablet   2. Which pharmacy/location (including street and city if local pharmacy) is medication to be sent to? [facility] - [place], [place] - [address] AT Portal to Reg[facility]  3. Do they need a 30 day or 90 day supply?  90 day supply

## 2024-07-15 ENCOUNTER — Encounter: Payer: Self-pay | Admitting: Physical Therapy

## 2024-07-15 ENCOUNTER — Ambulatory Visit: Admitting: Physical Therapy

## 2024-07-15 DIAGNOSIS — R2681 Unsteadiness on feet: Secondary | ICD-10-CM

## 2024-07-15 DIAGNOSIS — M25551 Pain in right hip: Secondary | ICD-10-CM | POA: Diagnosis not present

## 2024-07-15 DIAGNOSIS — M6281 Muscle weakness (generalized): Secondary | ICD-10-CM

## 2024-07-15 DIAGNOSIS — M5459 Other low back pain: Secondary | ICD-10-CM

## 2024-07-15 NOTE — Therapy (Signed)
 DAILY NOTE    Patient Name: Andrew [name] MRN: [id number] DOB:08/20/1934, 89 y.o., male Today's Date: 07/15/2024   PT End of Session - 07/15/24 0933     Visit Number 12    Number of Visits 18    Date for Recertification  08/29/24    Authorization Time Period Approved 8 PT visits from 07/07/24-08/18/24    Authorization - Visit Number 2    Authorization - Number of Visits 8    Progress Note Due on Visit 20    PT Start Time 0932    PT Stop Time 1016    PT Time Calculation (min) 44 min    Activity Tolerance Patient tolerated treatment well    Behavior During Therapy [facility] for tasks assessed/performed            Past Medical History:  Diagnosis Date   Chronic kidney disease    CKD stg 3b   Colonic polyp 2011 last colo   colo q 37yr - [facility]    Coronary artery disease 02/2009   a. 02/2009 Cath: severe left main and three-vessel-->CABG; b. 12/2016 MVL EF 49%, no isch/infarct.   DEGENERATIVE JOINT DISEASE    Diastolic dysfunction    a. 10/2021 Echo: EF 55-60%, no rwma, mod conc LVH, GrI DD, mildly reduced RV fxn, RVSP 26.8 mmHg. mild AI, and aortic sclerosis.   Diverticulosis of colon    DIVERTICULOSIS, COLON    GERD (gastroesophageal reflux disease)    History of kidney stones    HYPERLIPIDEMIA-MIXED    HYPERTENSION, BENIGN    Interstitial lung disease (HCC)    LBP (low back pain)    s/p decompression 8/14; ESI - [place]; RFA x 3 early 2015   Pacemaker    a. 05/2017: St. Jude (serial number [id number]) pacemaker for CHB   Symptomatic bradycardia    a. s/p St. Jude (serial number [id number]) pacemaker 06/16/17   Type II or unspecified type diabetes mellitus without mention of complication, not stated as uncontrolled    VITAMIN D  DEFICIENCY    Past Surgical History:  Procedure Laterality Date   CARPAL TUNNEL RELEASE Right    CATARACT EXTRACTION, BILATERAL     R 07/07/13, L 07/21/13   CORONARY ARTERY BYPASS GRAFT  03/17/09   x 4   CYSTOSCOPY WITH STENT PLACEMENT Left  05/10/2021   Procedure: CYSTOSCOPY WITH LEFT  STENT PLACEMENT,LEFT RETROGRADE PYELOGRAM;  Surgeon: Cam Morene ORN, MD;  Location: [facility];  Service: Urology;  Laterality: Left;   CYSTOSCOPY/URETEROSCOPY/HOLMIUM LASER/STENT PLACEMENT Left 06/03/2021   Procedure: CYSTOSCOPY, LEFT RETROGRADE, URETEROSCOPY,HOLMIUM LASER,STENT EXCHANGE;  Surgeon: Cam Morene ORN, MD;  Location: [facility];  Service: Urology;  Laterality: Left;   GLUTEUS MINIMUS REPAIR Right 05/06/2024   Procedure: REPAIR, TENDON, GLUTEUS MINIMUS;  Surgeon: Genelle Standing, MD;  Location:  SURGERY CENTER;  Service: Orthopedics;  Laterality: Right;  RIGHT GLUTEUS MAXIMUS TENDON TRANSFER   INGUINAL HERNIA REPAIR  1980   KNEE ARTHROSCOPY  2007   right   LUMBAR EPIDURAL INJECTION  2013   Has as needed.   LUMBAR LAMINECTOMY/DECOMPRESSION MICRODISCECTOMY N/A 06/19/2013   Procedure: LUMBAR LAMINECTOMY/DECOMPRESSION MICRODISCECTOMY;  Surgeon: Oneil Rodgers Priestly, MD;  Location: [facility];  Service: Orthopedics;  Laterality: N/A;  Lumbar 4-5 decompression   PACEMAKER IMPLANT N/A 06/16/2017   Procedure: Pacemaker Implant;  Surgeon: Waddell Danelle ORN, MD;  Location: [facility] INVASIVE CV LAB;  Service: Cardiovascular;  Laterality: N/A;   TRIGGER FINGER RELEASE Right    Patient Active Problem List  Diagnosis Date Noted   Abscess of left leg 06/26/2024   Weight loss 04/01/2024   Tear of right gluteus minimus tendon 12/21/2023   Anemia 04/02/2023   Hematoma 03/18/2023   Supratherapeutic INR 03/18/2023   Pulmonary fibrosis (HCC) 04/02/2022   Poor balance 12/28/2021   Bilateral leg weakness 04/01/2021   CKD (chronic kidney disease) stage 3, GFR 30-59 ml/min (HCC) 03/31/2021   Pacemaker 07/21/2019   History of deep venous thrombosis (DVT) of distal vein of left lower extremity 08/28/2017   Complete heart block (HCC) - PPM 06/15/2017   Wears hearing aid 09/25/2016   Degeneration of lumbar or lumbosacral intervertebral disc 03/23/2015    Vitamin D  deficiency 11/03/2010   Diverticulosis of colon 11/03/2010   Osteoarthritis (arthritis due to wear and tear of joints) 11/03/2010   Dyslipidemia, goal LDL below 70 12/01/2009   CAD, ARTERY BYPASS GRAFT 12/01/2009   DOE (dyspnea on exertion) 12/01/2009   Type 2 diabetes mellitus with neurological manifestations, controlled (HCC)     PCP: Geofm Glade PARAS, MD  REFERRING PROVIDER: Geofm Glade PARAS, MD  THERAPY DIAG:  Pain in right hip  Muscle weakness  Unsteadiness on feet  Other low back pain  REFERRING DIAG: Tear of right gluteus minimus tendon, initial encounter [S76.011A]   Rationale for Evaluation and Treatment:  Rehabilitation  SUBJECTIVE:  PERTINENT PAST HISTORY:  Right hip gluteus maximus tendon transfer and Right hip open trochanteric bursectomy 7/15 for glute med repair, DM, CAD with bypass 2017, DOE, laminectomy, pacemaker, hx of DVT, blood thinner       PRECAUTIONS: see protocol below  WEIGHT BEARING RESTRICTIONS   WBAT per OP Note  OP Date: 05/06/2024  2 weeks 05/20/2024  4 weeks 06/03/2024  6 weeks 06/17/2024  8 weeks 07/01/2024  10 weeks 07/15/2024  12 weeks 07/29/2024   Glute med protocol:   [email or web address]  FALLS:  Has patient fallen in last 6 months? No, Number of falls: 0  MOI/History of condition:  Onset date: 7/15  SUBJECTIVE STATEMENT  07/15/2024: I got the injection in my back and it only seemed to help for about 1 day. I ordered a thing to keep my foot up with walking and when I am sleeping.  EVAL: Andrew [name] is a 89 y.o. male who presents to clinic with chief complaint of chronic R hip pain and stiffness.  Well known to this PT and clinic.  Chronic glute min/med tear which was was repaired on 7/15 using glute max tendon transfer.  He has had quite a bit of pain since the surgery  but this is improving.  1 dressing change with no current drainage.  Has son at home who has been helpful.   Red flags:  denies malaise, erythema / streaking, and chills / night sweats  Pain:  Are you having pain? Yes Pain location: R hip pain NPRS scale: 6/10 Aggravating factors: walking, standing  Relieving factors: rest, sitting Pain description: intermittent  Occupation: retired   Education administrator: [employer]   Hand Dominance: NA  Patient Goals/Specific Activities: reduce pain, improve ability to complete ADLs   OBJECTIVE:   GENERAL OBSERVATION/GAIT: Antalgic gait with reduced time in stance on R  LE MMT:  MMT Right (Eval) Left (Eval)  Hip flexion (L2, L3)    Knee extension (L3)    Knee flexion    Hip abduction    Hip extension    Hip external rotation    Hip internal rotation    Hip adduction  Ankle dorsiflexion (L4)    Ankle plantarflexion (S1)    Ankle inversion    Ankle eversion    Great Toe ext (L5)    Grossly     (Blank rows = not tested, score listed is out of 5 possible points.  N = WNL, D = diminished, C = clear for gross weakness with myotome testing, * = concordant pain with testing)  LE ROM:  ROM Right (Eval) Left (Eval) Right 06/09/24 8/28  Hip flexion 90 (stopped per protocol)   Easily to 100 PROM Symmetrical   Hip extension      Hip abduction      Hip adduction      Hip internal rotation      Hip external rotation      Knee extension      Knee flexion      Ankle dorsiflexion      Ankle plantarflexion      Ankle inversion      Ankle eversion       (Blank rows = not tested, N = WNL, * = concordant pain with testing)  Functional Tests  Eval    6 MWT: unable to participate d/t pain                                                        .oprct  PATIENT SURVEYS:  LEFS: 16/80   TODAY'S TREATMENT: [facility] Adult PT Treatment:                                                DATE: 07/15/24 Hamstring stretch PNF contract/  relax with RLE LAD RLE with oscillation  SLR 2 x 12 RLE only LTR 2 x 10  Nu-step L 5 x 25m in LE only Lateral stepping with setting pelvic height first and holding before stepping over with hurdles x 6 in // Rhomberg balance on airex pad with minimal postural sway noted 3 x 30 sec. Added head turns with increased postural sway noted.   [facility] Adult PT Treatment  07/10/2024:  Therapeutic Exercise: Bike - 6 min - L3 Knee ext machine - 10# - 2x10 - SL Knee flexion machine - 20# - 3x10  Therapeutic Activity  Standing hip 3 way abd and ext - 2x10 ea - mirror for visual feedback - attempted R UE support only but unable to maintain neutral pelvis d/t LBP and R hip weakness Slow march with concentration on neutral pelvis - bil UE support Hip hike on step with mirror - 2'' step - requires bil UE support Hip abd circles - 10x ea bil   [facility] Adult PT Treatment  07/04/2024:  Therapeutic Exercise: Bike - 6 min - L3 Knee ext machine - 10# - 2x10 - SL Knee flexion machine - 20# - 3x10  Therapeutic Activity  Calf raises - 2x15 Standing hip 3 way abd and ext - 2x10 ea - mirror for visual feedback Checking and updating goals including 6 MWT; reviewing with pt  [facility] Adult PT Treatment:  DATE: 07/02/24 Nu-step L5 x x 5 min LE only Standing hip abduction/ ext 2 x 10 performed bil Sit to stand 1 x 5, 2 x 5 with LLE advanced 1 inch Knee ext machine - 2 x 10 10# single leg  Knee flexion machine - 2 x 10 20#with bil LE Standing hip hikes standing on LLE with HHA from chair 2 x 10  [facility] Adult PT Treatment  06/24/2024:  Therapeutic Exercise: Bike - 6 min - L3 Knee ext machine - 10# - 3x10 Knee flexion machine - 20# - 3x10  Therapeutic Activity  Calf raises - 2x15 Standing hip abd and ext - 2x10 ea Standing march on foam - 2x10 ea Step up to foam - 3x5 ea Lateral walking at counter    HOME EXERCISE PROGRAM: Access Code: [email or web address] URL:  [email or web address] Date: 06/19/2024 Prepared by: Helene Gasmen  Exercises - Standing Hip Abduction with Counter Support  - 1 x daily - 7 x weekly - 3 sets - 10 reps - Standing Hip Extension with Counter Support  - 1 x daily - 7 x weekly - 3 sets - 10 reps - Mini Squat with Counter Support  - 1 x daily - 7 x weekly - 3 sets - 10 reps - Standing Hip Flexion with Counter Support  - 1 x daily - 7 x weekly - 3 sets - 10 reps   Treatment priorities   Eval 9/18        Hip abd strength cc/oc        L foot drop                                 ASSESSMENT:  CLINICAL IMPRESSION:  07/15/2024: Andrew [name] arrives to session noting he got his back injection but endorses limited benefits beyond 1 day. Continued focusing on strengthening of bil LE and maintaining pelvic height with stepping over hurdles in //. He did well with static balance with minimal postural sway on an unstable surface, with head turns he exhibited increased postural sway and fatigued quickly requiring CGA for safety. End of session he denied any pain and noted feeling pretty good.     Andrew [name] is a 89 y.o. male who presents to clinic with signs and sxs consistent with R hip pain following glute min repair on 7/15.   Still having high pain levels but only about 1 week out at this point.  Started with some sub max isometrics and gentle ROM.  Did not complete strength testing d/t precautions and pain.   Andrew [name] will benefit from skilled PT to address relevant deficits and improve ability to complete daily tasks such as walking, standing, and bending in order to care for himself and his wife.   OBJECTIVE IMPAIRMENTS: Pain, hip ROM, hip strength, gait, balance  ACTIVITY LIMITATIONS: walking, standing, bending, limiting, squatting  PERSONAL FACTORS: See medical history and pertinent history   REHAB POTENTIAL: Good  CLINICAL DECISION MAKING: Evolving/moderate complexity  EVALUATION COMPLEXITY:  Moderate   GOALS:   SHORT TERM GOALS: Target date: 06/10/2024   Andrew [name] will be >75% HEP compliant to improve carryover between sessions and facilitate independent management of condition  Evaluation: ongoing Goal status: MET   LONG TERM GOALS: Target date: 07/08/2024 extended to 08/29/2024    Andrew [name] will self report >/= 50% decrease in pain from evaluation to improve function in daily tasks  Evaluation/Baseline: 8/10 max pain 9/12: 2/10 Goal status:  MET   2.  Andrew [name] will show a >/= 27 pt improvement in LEFS score (MCID is ~11% or 9 pts) as a proxy for functional improvement   Evaluation/Baseline: 16/80 pts 9/12: 34/80 Goal status: ongoing   3.  Andrew [name] will be able to complete household tasks including caring for his wife, not limited by pain  Evaluation/Baseline: limited 9/12: Significantly improved, but still limited in more vigorous tasks like taking trash to road Goal status: ONGOING  4.  Andrew [name] will improve 6 minute walk test to age adjusted norm (MCID 50 m).  Evaluation/Baseline: not tested 9/12: 630 ft - significant trendelenburg  Goal status: INITIAL     5.  Andrew [name] will improve 30'' STS (MCID 2) to >/= 10x (w/ UE?: N) to show improved LE strength and improved transfers   Evaluation/Baseline: not tested 9/12: 5x w/ UE Goal status: Ongoing   PLAN: PT FREQUENCY: 1-2x/week  PT DURATION: 8 weeks  PLANNED INTERVENTIONS:  97164- PT Re-evaluation, 97110-Therapeutic exercises, 97530- Therapeutic activity, 97112- Neuromuscular re-education, 97535- Self Care, 02859- Manual therapy, [id number]- Gait training, [id number]- Aquatic Therapy, [phone]- Electrical stimulation (manual), [id number]- Vasopneumatic device, [id number]- Traction (mechanical), [id number]- Ionotophoresis 4mg /ml Dexamethasone , Taping, Dry Needling, Joint manipulation, and Spinal manipulation.  Joneen Fresh PT, DPT, LAT, ATC  07/15/24  10:23 AM        Name: Nico Syme  MRN: [id number] 12 visits    Check  all possible CPT codes: 02889- Therapeutic Exercise, [phone]- Neuro Re-education, [phone] - Gait Training, [phone] - Manual Therapy, 97530 - Therapeutic Activities, 97535 - Self Care, [phone] - Re-evaluation, [id number] - Mechanical traction, and [id number] - Aquatic therapy   Thank you!  MCD - Secure

## 2024-07-16 NOTE — Progress Notes (Signed)
 Remote PPM Transmission

## 2024-07-17 ENCOUNTER — Encounter: Payer: Self-pay | Admitting: Physical Therapy

## 2024-07-17 ENCOUNTER — Ambulatory Visit: Admitting: Physical Therapy

## 2024-07-17 DIAGNOSIS — M25551 Pain in right hip: Secondary | ICD-10-CM

## 2024-07-17 DIAGNOSIS — M5459 Other low back pain: Secondary | ICD-10-CM

## 2024-07-17 DIAGNOSIS — M6281 Muscle weakness (generalized): Secondary | ICD-10-CM

## 2024-07-17 DIAGNOSIS — R2681 Unsteadiness on feet: Secondary | ICD-10-CM

## 2024-07-17 NOTE — Therapy (Signed)
 DAILY NOTE    Patient Name: Andrew [name] MRN: [id number] DOB:1934-06-03, 89 y.o., male Today's Date: 07/17/2024   PT End of Session - 07/17/24 0927     Visit Number 13    Number of Visits 18    Date for Recertification  08/29/24    Authorization Time Period Approved 8 PT visits from 07/07/24-08/18/24    Authorization - Visit Number 3    Authorization - Number of Visits 8    Progress Note Due on Visit 20    PT Start Time 0930    PT Stop Time 1012    PT Time Calculation (min) 42 min    Activity Tolerance Patient tolerated treatment well    Behavior During Therapy Andrew [name] for tasks assessed/performed            Past Medical History:  Diagnosis Date   Chronic kidney disease    CKD stg 3b   Colonic polyp 2011 last colo   colo q 27yr - [facility]    Coronary artery disease 02/2009   a. 02/2009 Cath: severe left main and three-vessel-->CABG; b. 12/2016 MVL EF 49%, no isch/infarct.   DEGENERATIVE JOINT DISEASE    Diastolic dysfunction    a. 10/2021 Echo: EF 55-60%, no rwma, mod conc LVH, GrI DD, mildly reduced RV fxn, RVSP 26.8 mmHg. mild AI, and aortic sclerosis.   Diverticulosis of colon    DIVERTICULOSIS, COLON    GERD (gastroesophageal reflux disease)    History of kidney stones    HYPERLIPIDEMIA-MIXED    HYPERTENSION, BENIGN    Interstitial lung disease (HCC)    LBP (low back pain)    s/p decompression 8/14; ESI - [place]; RFA x 3 early 2015   Pacemaker    a. 05/2017: St. Jude (serial number [id number]) pacemaker for CHB   Symptomatic bradycardia    a. s/p St. Jude (serial number [id number]) pacemaker 06/16/17   Type II or unspecified type diabetes mellitus without mention of complication, not stated as uncontrolled    VITAMIN D  DEFICIENCY    Past Surgical History:  Procedure Laterality Date   CARPAL TUNNEL RELEASE Right    CATARACT EXTRACTION, BILATERAL     R 07/07/13, L 07/21/13   CORONARY ARTERY BYPASS GRAFT  03/17/09   x 4   CYSTOSCOPY WITH STENT PLACEMENT Left  05/10/2021   Procedure: CYSTOSCOPY WITH LEFT  STENT PLACEMENT,LEFT RETROGRADE PYELOGRAM;  Surgeon: Cam Morene ORN, MD;  Location: [facility];  Service: Urology;  Laterality: Left;   CYSTOSCOPY/URETEROSCOPY/HOLMIUM LASER/STENT PLACEMENT Left 06/03/2021   Procedure: CYSTOSCOPY, LEFT RETROGRADE, URETEROSCOPY,HOLMIUM LASER,STENT EXCHANGE;  Surgeon: Cam Morene ORN, MD;  Location: [facility];  Service: Urology;  Laterality: Left;   GLUTEUS MINIMUS REPAIR Right 05/06/2024   Procedure: REPAIR, TENDON, GLUTEUS MINIMUS;  Surgeon: Genelle Standing, MD;  Location: [facility];  Service: Orthopedics;  Laterality: Right;  RIGHT GLUTEUS MAXIMUS TENDON TRANSFER   INGUINAL HERNIA REPAIR  1980   KNEE ARTHROSCOPY  2007   right   LUMBAR EPIDURAL INJECTION  2013   Has as needed.   LUMBAR LAMINECTOMY/DECOMPRESSION MICRODISCECTOMY N/A 06/19/2013   Procedure: LUMBAR LAMINECTOMY/DECOMPRESSION MICRODISCECTOMY;  Surgeon: Oneil Rodgers Priestly, MD;  Location: [name];  Service: Orthopedics;  Laterality: N/A;  Lumbar 4-5 decompression   PACEMAKER IMPLANT N/A 06/16/2017   Procedure: Pacemaker Implant;  Surgeon: Waddell Danelle ORN, MD;  Location: [facility] IN[facility]ASIVE CV [facility];  Service: Cardiovascular;  Laterality: N/A;   TRIGGER FINGER RELEASE Right    Patient Active Problem List  Diagnosis Date Noted   Abscess of left leg 06/26/2024   Weight loss 04/01/2024   Tear of right gluteus minimus tendon 12/21/2023   Anemia 04/02/2023   Hematoma 03/18/2023   Supratherapeutic INR 03/18/2023   Pulmonary fibrosis (HCC) 04/02/2022   Poor balance 12/28/2021   Bilateral leg weakness 04/01/2021   CKD (chronic kidney disease) stage 3, GFR 30-59 ml/min (HCC) 03/31/2021   Pacemaker 07/21/2019   History of deep venous thrombosis (DVT) of distal vein of left lower extremity 08/28/2017   Complete heart block (HCC) - PPM 06/15/2017   Wears hearing aid 09/25/2016   Degeneration of lumbar or lumbosacral intervertebral disc 03/23/2015    Vitamin D  deficiency 11/03/2010   Diverticulosis of colon 11/03/2010   Osteoarthritis (arthritis due to wear and tear of joints) 11/03/2010   Dyslipidemia, goal LDL below 70 12/01/2009   CAD, ARTERY BYPASS GRAFT 12/01/2009   DOE (dyspnea on exertion) 12/01/2009   Type 2 diabetes mellitus with neurological manifestations, controlled (HCC)     PCP: Geofm Glade PARAS, MD  REFERRING PROVIDER: Genelle Standing, MD  THERAPY DIAG:  Pain in right hip  Muscle weakness  Unsteadiness on feet  Other low back pain  REFERRING DIAG: Tear of right gluteus minimus tendon, initial encounter [S76.011A]   Rationale for Evaluation and Treatment:  Rehabilitation  SUBJECTIVE:  PERTINENT PAST HISTORY:  Right hip gluteus maximus tendon transfer and Right hip open trochanteric bursectomy 7/15 for glute med repair, DM, CAD with bypass 2017, DOE, laminectomy, pacemaker, hx of DVT, blood thinner       PRECAUTIONS: see protocol below  WEIGHT BEARING RESTRICTIONS   WBAT per OP Note  OP Date: 05/06/2024  2 weeks 05/20/2024  4 weeks 06/03/2024  6 weeks 06/17/2024  8 weeks 07/01/2024  10 weeks 07/15/2024  12 weeks 07/29/2024   Glute med protocol:   [email or web address]  FALLS:  Has patient fallen in last 6 months? No, Number of falls: 0  MOI/History of condition:  Onset date: 7/15  SUBJECTIVE STATEMENT  07/17/2024: Pt reports less benefit from injection than typical.  No R hip pain.  EVAL: Andrew [name] is a 89 y.o. male who presents to clinic with chief complaint of chronic R hip pain and stiffness.  Well known to this PT and clinic.  Chronic glute min/med tear which was was repaired on 7/15 using glute max tendon transfer.  He has had quite a bit of pain since the surgery but this is improving.  1 dressing change with no current drainage.  Has son at  home who has been helpful.   Red flags:  denies malaise, erythema / streaking, and chills / night sweats  Pain:  Are you having pain? Yes Pain location: R hip pain NPRS scale: 0-3/10 Aggravating factors: walking, standing  Relieving factors: rest, sitting Pain description: intermittent  Occupation: retired   Education administrator: [employer]   Hand Dominance: NA  Patient Goals/Specific Activities: reduce pain, improve ability to complete ADLs   OBJECTIVE:   GENERAL OBSERVATION/GAIT: Antalgic gait with reduced time in stance on R  LE MMT:  MMT Right (Eval) Left (Eval)  Hip flexion (L2, L3)    Knee extension (L3)    Knee flexion    Hip abduction    Hip extension    Hip external rotation    Hip internal rotation    Hip adduction    Ankle dorsiflexion (L4)    Ankle plantarflexion (S1)    Ankle inversion    Ankle eversion  Great Toe ext (L5)    Grossly     (Blank rows = not tested, score listed is out of 5 possible points.  N = WNL, D = diminished, C = clear for gross weakness with myotome testing, * = concordant pain with testing)  LE ROM:  ROM Right (Eval) Left (Eval) Right 06/09/24 8/28  Hip flexion 90 (stopped per protocol)   Easily to 100 PROM Symmetrical   Hip extension      Hip abduction      Hip adduction      Hip internal rotation      Hip external rotation      Knee extension      Knee flexion      Ankle dorsiflexion      Ankle plantarflexion      Ankle inversion      Ankle eversion       (Blank rows = not tested, N = WNL, * = concordant pain with testing)  Functional Tests  Eval    6 MWT: unable to participate d/t pain                                                        .oprct  PATIENT SURVEYS:  LEFS: 16/80   TODAY'S TREATMENT:  [facility] Adult PT Treatment  07/17/2024:  Therapeutic Exercise: Bike - 6 min - L3 Hamstring stretch R LE  Therapeutic Activity  Standing hip abd with 2.5# - OC ok, but closed chain increases R  sided LBP Attempted band resistance but increaed LBP Step up - 4'' - 10x ea fwd and lat Fwd w/ march  Andrew [name] Adult PT Treatment:                                                DATE: 07/15/24 Hamstring stretch PNF contract/ relax with RLE LAD RLE with oscillation  SLR 2 x 12 RLE only LTR 2 x 10  Nu-step L 5 x 46m in LE only Lateral stepping with setting pelvic height first and holding before stepping over with hurdles x 6 in // Rhomberg balance on airex pad with minimal postural sway noted 3 x 30 sec. Added head turns with increased postural sway noted.   [facility] Adult PT Treatment  07/10/2024:  Therapeutic Exercise: Bike - 6 min - L3 Knee ext machine - 10# - 2x10 - SL Knee flexion machine - 20# - 3x10  Therapeutic Activity  Standing hip 3 way abd and ext - 2x10 ea - mirror for visual feedback - attempted R UE support only but unable to maintain neutral pelvis d/t LBP and R hip weakness Slow march with concentration on neutral pelvis - bil UE support Hip hike on step with mirror - 2'' step - requires bil UE support Hip abd circles - 10x ea bil   [facility] Adult PT Treatment  07/04/2024:  Therapeutic Exercise: Bike - 6 min - L3 Knee ext machine - 10# - 2x10 - SL Knee flexion machine - 20# - 3x10  Therapeutic Activity  Calf raises - 2x15 Standing hip 3 way abd and ext - 2x10 ea - mirror for visual feedback Checking and updating goals including 6 MWT; reviewing  with pt  [facility] Adult PT Treatment:                                                DATE: 07/02/24 Nu-step L5 x x 5 min LE only Standing hip abduction/ ext 2 x 10 performed bil Sit to stand 1 x 5, 2 x 5 with LLE advanced 1 inch Knee ext machine - 2 x 10 10# single leg  Knee flexion machine - 2 x 10 20#with bil LE Standing hip hikes standing on LLE with HHA from chair 2 x 10  [facility] Adult PT Treatment  06/24/2024:  Therapeutic Exercise: Bike - 6 min - L3 Knee ext machine - 10# - 3x10 Knee flexion machine - 20# - 3x10  Therapeutic  Activity  Calf raises - 2x15 Standing hip abd and ext - 2x10 ea Standing march on foam - 2x10 ea Step up to foam - 3x5 ea Lateral walking at counter    HOME EXERCISE PROGRAM: Access Code: [email or web address] URL: [email or web address] Date: 06/19/2024 Prepared by: Helene Gasmen  Exercises - Standing Hip Abduction with Counter Support  - 1 x daily - 7 x weekly - 3 sets - 10 reps - Standing Hip Extension with Counter Support  - 1 x daily - 7 x weekly - 3 sets - 10 reps - Mini Squat with Counter Support  - 1 x daily - 7 x weekly - 3 sets - 10 reps - Standing Hip Flexion with Counter Support  - 1 x daily - 7 x weekly - 3 sets - 10 reps   Treatment priorities   Eval 9/18        Hip abd strength cc/oc        L foot drop                                 ASSESSMENT:  CLINICAL IMPRESSION:  07/17/2024: Andrew [name] tolerated session well with no adverse reaction.  Continuing to work on lateral hip strengthening to normalize gait.  Pt with progress with OC hip abd but has difficulty with SLS and CC strengthening d/t increase in R sided low back pain.  Added in step up with march to emphasize glute activation to good effect.    Andrew [name] is a 89 y.o. male who presents to clinic with signs and sxs consistent with R hip pain following glute min repair on 7/15.   Still having high pain levels but only about 1 week out at this point.  Started with some sub max isometrics and gentle ROM.  Did not complete strength testing d/t precautions and pain.   Andrew [name] will benefit from skilled PT to address relevant deficits and improve ability to complete daily tasks such as walking, standing, and bending in order to care for himself and his wife.   OBJECTIVE IMPAIRMENTS: Pain, hip ROM, hip strength, gait, balance  ACTIVITY LIMITATIONS: walking, standing, bending, limiting, squatting  PERSONAL FACTORS: See medical history and pertinent history   REHAB POTENTIAL: Good  CLINICAL DECISION MAKING:  Evolving/moderate complexity  EVALUATION COMPLEXITY: Moderate   GOALS:   SHORT TERM GOALS: Target date: 06/10/2024   Andrew [name] will be >75% HEP compliant to improve carryover between sessions and facilitate independent management of condition  Evaluation: ongoing Goal status: MET   LONG TERM GOALS: Target  date: 07/08/2024 extended to 08/29/2024    Andrew [name] will self report >/= 50% decrease in pain from evaluation to improve function in daily tasks  Evaluation/Baseline: 8/10 max pain 9/12: 2/10 Goal status: MET   2.  Andrew [name] will show a >/= 27 pt improvement in LEFS score (MCID is ~11% or 9 pts) as a proxy for functional improvement   Evaluation/Baseline: 16/80 pts 9/12: 34/80 Goal status: ongoing   3.  Andrew [name] will be able to complete household tasks including caring for his wife, not limited by pain  Evaluation/Baseline: limited 9/12: Significantly improved, but still limited in more vigorous tasks like taking trash to road Goal status: ONGOING  4.  Andrew [name] will improve 6 minute walk test to age adjusted norm (MCID 50 m).  Evaluation/Baseline: not tested 9/12: 630 ft - significant trendelenburg  Goal status: INITIAL     5.  Andrew [name] will improve 30'' STS (MCID 2) to >/= 10x (w/ UE?: N) to show improved LE strength and improved transfers   Evaluation/Baseline: not tested 9/12: 5x w/ UE Goal status: Ongoing   PLAN: PT FREQUENCY: 1-2x/week  PT DURATION: 8 weeks  PLANNED INTERVENTIONS:  97164- PT Re-evaluation, 97110-Therapeutic exercises, 97530- Therapeutic activity, 97112- Neuromuscular re-education, 97535- Self Care, 02859- Manual therapy, [id number]- Gait training, [id number]- Aquatic Therapy, [phone]- Electrical stimulation (manual), [id number]- Vasopneumatic device, [id number]- Traction (mechanical), [id number]- Ionotophoresis 4mg /ml Dexamethasone , Taping, Dry Needling, Joint manipulation, and Spinal manipulation.  Joneen Fresh PT, DPT, LAT, ATC  07/17/24  10:19 AM        Name:  Andrew [name]  MRN: [id number] 12 visits    Check all possible CPT codes: 02889- Therapeutic Exercise, [phone]- Neuro Re-education, [phone] - Gait Training, [phone] - Manual Therapy, 97530 - Therapeutic Activities, 97535 - Self Care, [phone] - Re-evaluation, [id number] - Mechanical traction, and [phone] - Aquatic therapy   Thank you!  MCD - Secure

## 2024-07-21 NOTE — Progress Notes (Signed)
    Subjective:    [name], male    DOB: 23-Mar-1934, 89 y.o.   MRN: [id number]      HPI Andrew [name] is here for No chief complaint on file.   left upper medial leg cellulitis - scratched his leg.    Saw Dr Norleen - prescribed doxycycline .  No change  Saw surgery - there was no evidence of abscess.  silver nitrate applied to area of granulation tissue and prescribed 7 day course of clindamycin.          Medications and allergies reviewed with patient and updated if appropriate.  Current Outpatient Medications on File Prior to Visit  Medication Sig Dispense Refill   Acetaminophen  (TYLENOL  8 HOUR PO) Take 1,000 mg by mouth 3 (three) times daily.     aspirin  EC 325 MG tablet Take 1 tablet (325 mg total) by mouth daily. 14 tablet 0   atorvastatin  (LIPITOR) 20 MG tablet TAKE 1 TABLET DAILY AT     6:00PM 90 tablet 1   carvedilol  (COREG ) 6.25 MG tablet Take 1 tablet (6.25 mg total) by mouth 2 (two) times daily with a meal. 180 tablet 3   cholecalciferol  (VITAMIN D ) 1000 UNITS tablet Take 1,000 Units by mouth daily.     doxycycline  (VIBRA -TABS) 100 MG tablet Take 1 tablet (100 mg total) by mouth 2 (two) times daily. 20 tablet 0   DULoxetine (CYMBALTA) 30 MG capsule Take 30 mg by mouth every morning.     HYDROcodone -acetaminophen  (NORCO/VICODIN) 5-325 MG tablet Take 1 tablet by mouth every 6 (six) hours as needed.     metFORMIN  (GLUCOPHAGE -XR) 500 MG 24 hr tablet TAKE 1 TABLET TWICE A DAY 180 tablet 3   Multiple Vitamin (MULTIVITAMIN WITH MINERALS) TABS tablet Take 1 tablet by mouth daily.     niacin  (NIASPAN ) 1000 MG CR tablet TAKE 1 TABLET AT BEDTIME 90 tablet 3   TURMERIC CURCUMIN PO Take 2,000 mg by mouth in the morning and at bedtime.     warfarin (JANTOVEN ) 2 MG tablet TAKE 1 TO 2 TABLETS DAILY AS DIRECTED BY COUMADIN  CLINIC 90 tablet 1   No current facility-administered medications on file prior to visit.    Review of Systems     Objective:  There were  no vitals filed for this visit. BP Readings from Last 3 Encounters:  06/26/24 (!) 92/56  05/06/24 (!) 151/73  05/01/24 (!) 112/58   Wt Readings from Last 3 Encounters:  06/26/24 151 lb (68.5 kg)  05/06/24 153 lb 3.5 oz (69.5 kg)  05/01/24 153 lb (69.4 kg)   There is no height or weight on file to calculate BMI.    Physical Exam         Assessment & Plan:    See Problem List for Assessment and Plan of chronic medical problems.

## 2024-07-22 ENCOUNTER — Encounter: Payer: Self-pay | Admitting: Internal Medicine

## 2024-07-22 ENCOUNTER — Ambulatory Visit (INDEPENDENT_AMBULATORY_CARE_PROVIDER_SITE_OTHER): Admitting: Internal Medicine

## 2024-07-22 VITALS — BP 130/64 | HR 60 | Temp 97.3°F | Ht 68.0 in | Wt 158.0 lb

## 2024-07-22 DIAGNOSIS — E0822 Diabetes mellitus due to underlying condition with diabetic chronic kidney disease: Secondary | ICD-10-CM | POA: Diagnosis not present

## 2024-07-22 DIAGNOSIS — R29898 Other symptoms and signs involving the musculoskeletal system: Secondary | ICD-10-CM | POA: Diagnosis not present

## 2024-07-22 DIAGNOSIS — L03116 Cellulitis of left lower limb: Secondary | ICD-10-CM | POA: Diagnosis not present

## 2024-07-22 DIAGNOSIS — Z7984 Long term (current) use of oral hypoglycemic drugs: Secondary | ICD-10-CM

## 2024-07-22 DIAGNOSIS — N1832 Chronic kidney disease, stage 3b: Secondary | ICD-10-CM

## 2024-07-22 NOTE — Assessment & Plan Note (Signed)
 Chronic Following with orthopedics at [facility] Currently doing physical therapy Receiving intermittent lumbar spine injections Left leg weakness is chronic and likely related to lumbar spine disease He will ask at his next appointment for further clarification of his leg weakness-advised that he can also discuss with Dr. Genelle Continue physical therapy

## 2024-07-22 NOTE — Assessment & Plan Note (Signed)
 Chronic Stage 3b Stable Will recheck blood work at his next routine visit

## 2024-07-22 NOTE — Assessment & Plan Note (Signed)
 Chronic Associated with chronic kidney disease stage 3b  Lab Results  Component Value Date   HGBA1C 6.7 (H) 04/01/2024   Sugars controlled Continue metformin  XR 500 mg twice daily

## 2024-07-22 NOTE — Assessment & Plan Note (Signed)
 Subacute Left upper medial leg cellulitis without abscess Completed doxycycline  without improvement Referred to surgery-no abscess identified and placed on clindamycin x 1 week Significantly improved Still has a small ulcerated area without granulation tissue and a larger area with granulation tissue No tenderness, swelling or fluctuance surrounding lesion Continue local wound care with Vaseline and keeping it covered He will monitor closely and let me know if it is not continuing to improve

## 2024-07-22 NOTE — Patient Instructions (Signed)
     Monitor the wound - it is is not continuing to improve please come in so I can look at it.      Medications changes include :   None

## 2024-07-23 ENCOUNTER — Encounter: Payer: Self-pay | Admitting: Physical Therapy

## 2024-07-23 ENCOUNTER — Ambulatory Visit: Attending: Internal Medicine | Admitting: Physical Therapy

## 2024-07-23 DIAGNOSIS — M5459 Other low back pain: Secondary | ICD-10-CM | POA: Insufficient documentation

## 2024-07-23 DIAGNOSIS — M25551 Pain in right hip: Secondary | ICD-10-CM | POA: Diagnosis present

## 2024-07-23 DIAGNOSIS — M6281 Muscle weakness (generalized): Secondary | ICD-10-CM | POA: Insufficient documentation

## 2024-07-23 DIAGNOSIS — R2681 Unsteadiness on feet: Secondary | ICD-10-CM | POA: Insufficient documentation

## 2024-07-23 NOTE — Therapy (Signed)
 DAILY NOTE    Patient Name: Andrew [name] MRN: [id number] DOB:1934/07/23, 89 y.o., male Today's Date: 07/23/2024   PT End of Session - 07/23/24 0800     Visit Number 14    Number of Visits 18    Date for Recertification  08/29/24    Authorization Time Period Approved 8 PT visits from 07/07/24-08/18/24    Authorization - Visit Number 4    Authorization - Number of Visits 8    Progress Note Due on Visit 20    PT Start Time 0800    PT Stop Time 0841    PT Time Calculation (min) 41 min    Activity Tolerance Patient tolerated treatment well    Behavior During Therapy [facility] for tasks assessed/performed            Past Medical History:  Diagnosis Date   Chronic kidney disease    CKD stg 3b   Colonic polyp 2011 last colo   colo q 71yr - [facility]    Coronary artery disease 02/2009   a. 02/2009 Cath: severe left main and three-vessel-->CABG; b. 12/2016 MVL EF 49%, no isch/infarct.   DEGENERATIVE JOINT DISEASE    Diastolic dysfunction    a. 10/2021 Echo: EF 55-60%, no rwma, mod conc LVH, GrI DD, mildly reduced RV fxn, RVSP 26.8 mmHg. mild AI, and aortic sclerosis.   Diverticulosis of colon    DIVERTICULOSIS, COLON    GERD (gastroesophageal reflux disease)    History of kidney stones    HYPERLIPIDEMIA-MIXED    HYPERTENSION, BENIGN    Interstitial lung disease (HCC)    LBP (low back pain)    s/p decompression 8/14; ESI - [place]; RFA x 3 early 2015   Pacemaker    a. 05/2017: St. Jude (serial number [id number]) pacemaker for CHB   Symptomatic bradycardia    a. s/p St. Jude (serial number [id number]) pacemaker 06/16/17   Type II or unspecified type diabetes mellitus without mention of complication, not stated as uncontrolled    VITAMIN D  DEFICIENCY    Past Surgical History:  Procedure Laterality Date   CARPAL TUNNEL RELEASE Right    CATARACT EXTRACTION, BILATERAL     R 07/07/13, L 07/21/13   CORONARY ARTERY BYPASS GRAFT  03/17/09   x 4   CYSTOSCOPY WITH STENT PLACEMENT Left  05/10/2021   Procedure: CYSTOSCOPY WITH LEFT  STENT PLACEMENT,LEFT RETROGRADE PYELOGRAM;  Surgeon: Cam Morene ORN, MD;  Location: [facility];  Service: Urology;  Laterality: Left;   CYSTOSCOPY/URETEROSCOPY/HOLMIUM LASER/STENT PLACEMENT Left 06/03/2021   Procedure: CYSTOSCOPY, LEFT RETROGRADE, URETEROSCOPY,HOLMIUM LASER,STENT EXCHANGE;  Surgeon: Cam Morene ORN, MD;  Location: [facility];  Service: Urology;  Laterality: Left;   GLUTEUS MINIMUS REPAIR Right 05/06/2024   Procedure: REPAIR, TENDON, GLUTEUS MINIMUS;  Surgeon: Genelle Standing, MD;  Location: [facility];  Service: Orthopedics;  Laterality: Right;  RIGHT GLUTEUS MAXIMUS TENDON TRANSFER   INGUINAL HERNIA REPAIR  1980   KNEE ARTHROSCOPY  2007   right   LUMBAR EPIDURAL INJECTION  2013   Has as needed.   LUMBAR LAMINECTOMY/DECOMPRESSION MICRODISCECTOMY N/A 06/19/2013   Procedure: LUMBAR LAMINECTOMY/DECOMPRESSION MICRODISCECTOMY;  Surgeon: Oneil Rodgers Priestly, MD;  Location: [facility] OR;  Service: Orthopedics;  Laterality: N/A;  Lumbar 4-5 decompression   PACEMAKER IMPLANT N/A 06/16/2017   Procedure: Pacemaker Implant;  Surgeon: Waddell Danelle ORN, MD;  Location: [facility] INVASIVE CV LAB;  Service: Cardiovascular;  Laterality: N/A;   TRIGGER FINGER RELEASE Right    Patient Active Problem List  Diagnosis Date Noted   Left leg weakness 07/22/2024   Left leg cellulitis 06/26/2024   Weight loss 04/01/2024   Tear of right gluteus minimus tendon 12/21/2023   Anemia 04/02/2023   Hematoma 03/18/2023   Supratherapeutic INR 03/18/2023   Pulmonary fibrosis (HCC) 04/02/2022   Poor balance 12/28/2021   Bilateral leg weakness 04/01/2021   Chronic kidney disease, stage 3b (HCC) 03/31/2021   Pacemaker 07/21/2019   History of deep venous thrombosis (DVT) of distal vein of left lower extremity 08/28/2017   Complete heart block (HCC) - PPM 06/15/2017   Wears hearing aid 09/25/2016   Degeneration of lumbar or lumbosacral intervertebral disc  03/23/2015   Vitamin D  deficiency 11/03/2010   Diverticulosis of colon 11/03/2010   Osteoarthritis (arthritis due to wear and tear of joints) 11/03/2010   Dyslipidemia, goal LDL below 70 12/01/2009   CAD, ARTERY BYPASS GRAFT 12/01/2009   DOE (dyspnea on exertion) 12/01/2009   Diabetes mellitus due to underlying condition with stage 3b chronic kidney disease, without long-term current use of insulin  (HCC)     PCP: Geofm Glade PARAS, MD  REFERRING PROVIDER: Genelle Standing, MD  THERAPY DIAG:  Pain in right hip  Muscle weakness  Unsteadiness on feet  REFERRING DIAG: Tear of right gluteus minimus tendon, initial encounter [S76.011A]   Rationale for Evaluation and Treatment:  Rehabilitation  SUBJECTIVE:  PERTINENT PAST HISTORY:  Right hip gluteus maximus tendon transfer and Right hip open trochanteric bursectomy 7/15 for glute med repair, DM, CAD with bypass 2017, DOE, laminectomy, pacemaker, hx of DVT, blood thinner       PRECAUTIONS: see protocol below  WEIGHT BEARING RESTRICTIONS   WBAT per OP Note  OP Date: 05/06/2024  2 weeks 05/20/2024  4 weeks 06/03/2024  6 weeks 06/17/2024  8 weeks 07/01/2024  10 weeks 07/15/2024  12 weeks 07/29/2024   Glute med protocol:   [email or web address]  FALLS:  Has patient fallen in last 6 months? No, Number of falls: 0  MOI/History of condition:  Onset date: 7/15  SUBJECTIVE STATEMENT  07/23/2024: Pt reports that his back injection was not helpful and he is having considerable R sided low back pain. Reports no hip pain.  EVAL: [name] is a 89 y.o. male who presents to clinic with chief complaint of chronic R hip pain and stiffness.  Well known to this PT and clinic.  Chronic glute min/med tear which was was repaired on 7/15 using glute max tendon transfer.  He has had quite a bit of pain  since the surgery but this is improving.  1 dressing change with no current drainage.  Has son at home who has been helpful.   Red flags:  denies malaise, erythema / streaking, and chills / night sweats  Pain:  Are you having pain? Yes Pain location: R hip pain NPRS scale: 0-3/10 Aggravating factors: walking, standing  Relieving factors: rest, sitting Pain description: intermittent  Occupation: retired   Education administrator: [employer]   Hand Dominance: NA  Patient Goals/Specific Activities: reduce pain, improve ability to complete ADLs   OBJECTIVE:   GENERAL OBSERVATION/GAIT: Antalgic gait with reduced time in stance on R  LE MMT:  MMT Right (Eval) Left (Eval)  Hip flexion (L2, L3)    Knee extension (L3)    Knee flexion    Hip abduction    Hip extension    Hip external rotation    Hip internal rotation    Hip adduction    Ankle dorsiflexion (L4)  Ankle plantarflexion (S1)    Ankle inversion    Ankle eversion    Great Toe ext (L5)    Grossly     (Blank rows = not tested, score listed is out of 5 possible points.  N = WNL, D = diminished, C = clear for gross weakness with myotome testing, * = concordant pain with testing)  LE ROM:  ROM Right (Eval) Left (Eval) Right 06/09/24 8/28  Hip flexion 90 (stopped per protocol)   Easily to 100 PROM Symmetrical   Hip extension      Hip abduction      Hip adduction      Hip internal rotation      Hip external rotation      Knee extension      Knee flexion      Ankle dorsiflexion      Ankle plantarflexion      Ankle inversion      Ankle eversion       (Blank rows = not tested, N = WNL, * = concordant pain with testing)  Functional Tests  Eval    6 MWT: unable to participate d/t pain                                                          PATIENT SURVEYS:  LEFS: 16/80   TODAY'S TREATMENT:  [facility] Adult PT Treatment  07/23/24:  Therapeutic Exercise: Bike - 5 min - L4 Hamstring stretch R  LE Supine bridge staggered  Therapeutic Activity  Standing hip abd with 5# - OC ok, but closed chain increases R sided LBP Lateral walking with YTB at knees Staggered STS from slightly raised table - 2x10 With YTB at knees Step up - 4'' - 10x ea fwd and lat Fwd w/ march  [facility] Adult PT Treatment:                                                DATE: 07/15/24 Hamstring stretch PNF contract/ relax with RLE LAD RLE with oscillation  SLR 2 x 12 RLE only LTR 2 x 10  Nu-step L 5 x 23m in LE only Lateral stepping with setting pelvic height first and holding before stepping over with hurdles x 6 in // Rhomberg balance on airex pad with minimal postural sway noted 3 x 30 sec. Added head turns with increased postural sway noted.   [facility] Adult PT Treatment  07/10/2024:  Therapeutic Exercise: Bike - 6 min - L3 Knee ext machine - 10# - 2x10 - SL Knee flexion machine - 20# - 3x10  Therapeutic Activity  Standing hip 3 way abd and ext - 2x10 ea - mirror for visual feedback - attempted R UE support only but unable to maintain neutral pelvis d/t LBP and R hip weakness Slow march with concentration on neutral pelvis - bil UE support Hip hike on step with mirror - 2'' step - requires bil UE support Hip abd circles - 10x ea bil   [facility] Adult PT Treatment  07/04/2024:  Therapeutic Exercise: Bike - 6 min - L3 Knee ext machine - 10# - 2x10 - SL Knee flexion machine - 20# - 3x10  Therapeutic Activity  Calf raises - 2x15 Standing hip 3 way abd and ext - 2x10 ea - mirror for visual feedback Checking and updating goals including 6 MWT; reviewing with pt  [facility] Adult PT Treatment:                                                DATE: 07/02/24 Nu-step L5 x x 5 min LE only Standing hip abduction/ ext 2 x 10 performed bil Sit to stand 1 x 5, 2 x 5 with LLE advanced 1 inch Knee ext machine - 2 x 10 10# single leg  Knee flexion machine - 2 x 10 20#with bil LE Standing hip hikes standing on LLE with HHA  from chair 2 x 10  [facility] Adult PT Treatment  06/24/2024:  Therapeutic Exercise: Bike - 6 min - L3 Knee ext machine - 10# - 3x10 Knee flexion machine - 20# - 3x10  Therapeutic Activity  Calf raises - 2x15 Standing hip abd and ext - 2x10 ea Standing march on foam - 2x10 ea Step up to foam - 3x5 ea Lateral walking at counter    HOME EXERCISE PROGRAM: Access Code: [email or web address] URL: [email or web address] Date: 06/19/2024 Prepared by: Helene Gasmen  Exercises - Standing Hip Abduction with Counter Support  - 1 x daily - 7 x weekly - 3 sets - 10 reps - Standing Hip Extension with Counter Support  - 1 x daily - 7 x weekly - 3 sets - 10 reps - Mini Squat with Counter Support  - 1 x daily - 7 x weekly - 3 sets - 10 reps - Standing Hip Flexion with Counter Support  - 1 x daily - 7 x weekly - 3 sets - 10 reps   Treatment priorities   Eval 9/18        Hip abd strength cc/oc        L foot drop                                 ASSESSMENT:  CLINICAL IMPRESSION:  07/23/2024: Andrew [name] tolerated session well with no adverse reaction.  Continuing to work on lateral hip strengthening to normalize gait.  Pt still significantly limited in CC R lateral hip strength and is unable to maintain neutral pelvis without contralateral UE support.  Discussed relevant anatomy and encouraged to work on reducing UE support with small L LE march at home with counter support as needed.    Andrew [name] is a 89 y.o. male who presents to clinic with signs and sxs consistent with R hip pain following glute min repair on 7/15.   Still having high pain levels but only about 1 week out at this point.  Started with some sub max isometrics and gentle ROM.  Did not complete strength testing d/t precautions and pain.   Abigail will benefit from skilled PT to address relevant deficits and improve ability to complete daily tasks such as walking, standing, and bending in order to care for himself and his wife.    OBJECTIVE IMPAIRMENTS: Pain, hip ROM, hip strength, gait, balance  ACTIVITY LIMITATIONS: walking, standing, bending, limiting, squatting  PERSONAL FACTORS: See medical history and pertinent history   REHAB POTENTIAL: Good  CLINICAL DECISION MAKING: Evolving/moderate complexity  EVALUATION COMPLEXITY: Moderate   GOALS:  SHORT TERM GOALS: Target date: 06/10/2024   Andoni will be >75% HEP compliant to improve carryover between sessions and facilitate independent management of condition  Evaluation: ongoing Goal status: MET   LONG TERM GOALS: Target date: 07/08/2024 extended to 08/29/2024    Jeremian will self report >/= 50% decrease in pain from evaluation to improve function in daily tasks  Evaluation/Baseline: 8/10 max pain 9/12: 2/10 Goal status: MET   2.  Delos will show a >/= 27 pt improvement in LEFS score (MCID is ~11% or 9 pts) as a proxy for functional improvement   Evaluation/Baseline: 16/80 pts 9/12: 34/80 Goal status: ongoing   3.  Cinch will be able to complete household tasks including caring for his wife, not limited by pain  Evaluation/Baseline: limited 9/12: Significantly improved, but still limited in more vigorous tasks like taking trash to road Goal status: ONGOING  4.  Isael will improve 6 minute walk test to age adjusted norm (MCID 50 m).  Evaluation/Baseline: not tested 9/12: 630 ft - significant trendelenburg  Goal status: INITIAL     5.  Avyn will improve 30'' STS (MCID 2) to >/= 10x (w/ UE?: N) to show improved LE strength and improved transfers   Evaluation/Baseline: not tested 9/12: 5x w/ UE Goal status: Ongoing   PLAN: PT FREQUENCY: 1-2x/week  PT DURATION: 8 weeks  PLANNED INTERVENTIONS:  97164- PT Re-evaluation, 97110-Therapeutic exercises, 97530- Therapeutic activity, 97112- Neuromuscular re-education, 97535- Self Care, 02859- Manual therapy, [id number]- Gait training, [id number]- Aquatic Therapy, [phone]- Electrical stimulation  (manual), [id number]- Vasopneumatic device, [id number]- Traction (mechanical), [id number]- Ionotophoresis 4mg /ml Dexamethasone , Taping, Dry Needling, Joint manipulation, and Spinal manipulation.  Helene BRAVO [place] Bolds PT 07/23/24  9:01 AM        Name: Burnard Enis  MRN: [id number] 12 visits    Check all possible CPT codes: 02889- Therapeutic Exercise, [phone]- Neuro Re-education, [phone] - Gait Training, [phone] - Manual Therapy, 97530 - Therapeutic Activities, 97535 - Self Care, [phone] - Re-evaluation, [id number] - Mechanical traction, and [phone] - Aquatic therapy   Thank you!  MCD - Secure

## 2024-07-29 ENCOUNTER — Encounter: Payer: Self-pay | Admitting: Physical Therapy

## 2024-07-29 ENCOUNTER — Ambulatory Visit: Admitting: Physical Therapy

## 2024-07-29 DIAGNOSIS — M25551 Pain in right hip: Secondary | ICD-10-CM | POA: Diagnosis not present

## 2024-07-29 DIAGNOSIS — R2681 Unsteadiness on feet: Secondary | ICD-10-CM

## 2024-07-29 DIAGNOSIS — M6281 Muscle weakness (generalized): Secondary | ICD-10-CM

## 2024-07-29 NOTE — Therapy (Signed)
 DAILY NOTE    Patient Name: Andrew [name] MRN: [id number] DOB:December 19, 1933, 89 y.o., male Today's Date: 07/29/2024   PT End of Session - 07/29/24 1459     Visit Number 15    Number of Visits 18    Date for Recertification  08/29/24    Authorization Time Period Approved 8 PT visits from 07/07/24-08/18/24    Authorization - Visit Number 5    Authorization - Number of Visits 8    Progress Note Due on Visit 20    PT Start Time 0300    PT Stop Time 0341    PT Time Calculation (min) 41 min    Activity Tolerance Patient tolerated treatment well    Behavior During Therapy [facility] for tasks assessed/performed            Past Medical History:  Diagnosis Date   Chronic kidney disease    CKD stg 3b   Colonic polyp 2011 last colo   colo q 81yr - [facility]    Coronary artery disease 02/2009   a. 02/2009 Cath: severe left main and three-vessel-->CABG; b. 12/2016 MVL EF 49%, no isch/infarct.   DEGENERATIVE JOINT DISEASE    Diastolic dysfunction    a. 10/2021 Echo: EF 55-60%, no rwma, mod conc LVH, GrI DD, mildly reduced RV fxn, RVSP 26.8 mmHg. mild AI, and aortic sclerosis.   Diverticulosis of colon    DIVERTICULOSIS, COLON    GERD (gastroesophageal reflux disease)    History of kidney stones    HYPERLIPIDEMIA-MIXED    HYPERTENSION, BENIGN    Interstitial lung disease (HCC)    LBP (low back pain)    s/p decompression 8/14; ESI - [place]; RFA x 3 early 2015   Pacemaker    a. 05/2017: St. Jude (serial number [id number]) pacemaker for CHB   Symptomatic bradycardia    a. s/p St. Jude (serial number [id number]) pacemaker 06/16/17   Type II or unspecified type diabetes mellitus without mention of complication, not stated as uncontrolled    VITAMIN D  DEFICIENCY    Past Surgical History:  Procedure Laterality Date   CARPAL TUNNEL RELEASE Right    CATARACT EXTRACTION, BILATERAL     R 07/07/13, L 07/21/13   CORONARY ARTERY BYPASS GRAFT  03/17/09   x 4   CYSTOSCOPY WITH STENT PLACEMENT Left  05/10/2021   Procedure: CYSTOSCOPY WITH LEFT  STENT PLACEMENT,LEFT RETROGRADE PYELOGRAM;  Surgeon: Cam Morene ORN, MD;  Location: [facility];  Service: Urology;  Laterality: Left;   CYSTOSCOPY/URETEROSCOPY/HOLMIUM LASER/STENT PLACEMENT Left 06/03/2021   Procedure: CYSTOSCOPY, LEFT RETROGRADE, URETEROSCOPY,HOLMIUM LASER,STENT EXCHANGE;  Surgeon: Cam Morene ORN, MD;  Location: [facility];  Service: Urology;  Laterality: Left;   GLUTEUS MINIMUS REPAIR Right 05/06/2024   Procedure: REPAIR, TENDON, GLUTEUS MINIMUS;  Surgeon: Genelle Standing, MD;  Location: [facility];  Service: Orthopedics;  Laterality: Right;  RIGHT GLUTEUS MAXIMUS TENDON TRANSFER   INGUINAL HERNIA REPAIR  1980   KNEE ARTHROSCOPY  2007   right   LUMBAR EPIDURAL INJECTION  2013   Has as needed.   LUMBAR LAMINECTOMY/DECOMPRESSION MICRODISCECTOMY N/A 06/19/2013   Procedure: LUMBAR LAMINECTOMY/DECOMPRESSION MICRODISCECTOMY;  Surgeon: Oneil Rodgers Priestly, MD;  Location: [facility] OR;  Service: Orthopedics;  Laterality: N/A;  Lumbar 4-5 decompression   PACEMAKER IMPLANT N/A 06/16/2017   Procedure: Pacemaker Implant;  Surgeon: Waddell Danelle ORN, MD;  Location: [facility] INVASIVE CV LAB;  Service: Cardiovascular;  Laterality: N/A;   TRIGGER FINGER RELEASE Right    Patient Active Problem List  Diagnosis Date Noted   Left leg weakness 07/22/2024   Left leg cellulitis 06/26/2024   Weight loss 04/01/2024   Tear of right gluteus minimus tendon 12/21/2023   Anemia 04/02/2023   Hematoma 03/18/2023   Supratherapeutic INR 03/18/2023   Pulmonary fibrosis (HCC) 04/02/2022   Poor balance 12/28/2021   Bilateral leg weakness 04/01/2021   Chronic kidney disease, stage 3b (HCC) 03/31/2021   Pacemaker 07/21/2019   History of deep venous thrombosis (DVT) of distal vein of left lower extremity 08/28/2017   Complete heart block (HCC) - PPM 06/15/2017   Wears hearing aid 09/25/2016   Degeneration of lumbar or lumbosacral intervertebral disc  03/23/2015   Vitamin D  deficiency 11/03/2010   Diverticulosis of colon 11/03/2010   Osteoarthritis (arthritis due to wear and tear of joints) 11/03/2010   Dyslipidemia, goal LDL below 70 12/01/2009   CAD, ARTERY BYPASS GRAFT 12/01/2009   DOE (dyspnea on exertion) 12/01/2009   Diabetes mellitus due to underlying condition with stage 3b chronic kidney disease, without long-term current use of insulin  (HCC)     PCP: Geofm Glade PARAS, MD  REFERRING PROVIDER: Genelle Standing, MD  THERAPY DIAG:  Pain in right hip  Muscle weakness  Unsteadiness on feet  REFERRING DIAG: Tear of right gluteus minimus tendon, initial encounter [S76.011A]   Rationale for Evaluation and Treatment:  Rehabilitation  SUBJECTIVE:  PERTINENT PAST HISTORY:  Right hip gluteus maximus tendon transfer and Right hip open trochanteric bursectomy 7/15 for glute med repair, DM, CAD with bypass 2017, DOE, laminectomy, pacemaker, hx of DVT, blood thinner       PRECAUTIONS: see protocol below  WEIGHT BEARING RESTRICTIONS   WBAT per OP Note  OP Date: 05/06/2024  2 weeks 05/20/2024  4 weeks 06/03/2024  6 weeks 06/17/2024  8 weeks 07/01/2024  10 weeks 07/15/2024  12 weeks 07/29/2024   Glute med protocol:   [email or web address]  FALLS:  Has patient fallen in last 6 months? No, Number of falls: 0  MOI/History of condition:  Onset date: 7/15  SUBJECTIVE STATEMENT  07/29/2024: Pt continues to have severe R sided low back pain which limits standing activities.  No pain R lateral hip.  EVAL: Andrew [name] is a 89 y.o. male who presents to clinic with chief complaint of chronic R hip pain and stiffness.  Well known to this PT and clinic.  Chronic glute min/med tear which was was repaired on 7/15 using glute max tendon transfer.  He has had quite a bit of pain since the  surgery but this is improving.  1 dressing change with no current drainage.  Has son at home who has been helpful.   Red flags:  denies malaise, erythema / streaking, and chills / night sweats  Pain:  Are you having pain? Yes Pain location: R hip pain NPRS scale: 0-3/10 Aggravating factors: walking, standing  Relieving factors: rest, sitting Pain description: intermittent  Occupation: retired   Education administrator: [employer]   Hand Dominance: NA  Patient Goals/Specific Activities: reduce pain, improve ability to complete ADLs   OBJECTIVE:   GENERAL OBSERVATION/GAIT: Antalgic gait with reduced time in stance on R  LE MMT:  MMT Right (Eval) Left (Eval)  Hip flexion (L2, L3)    Knee extension (L3)    Knee flexion    Hip abduction    Hip extension    Hip external rotation    Hip internal rotation    Hip adduction    Ankle dorsiflexion (L4)    Ankle  plantarflexion (S1)    Ankle inversion    Ankle eversion    Great Toe ext (L5)    Grossly     (Blank rows = not tested, score listed is out of 5 possible points.  N = WNL, D = diminished, C = clear for gross weakness with myotome testing, * = concordant pain with testing)  LE ROM:  ROM Right (Eval) Left (Eval) Right 06/09/24 8/28  Hip flexion 90 (stopped per protocol)   Easily to 100 PROM Symmetrical   Hip extension      Hip abduction      Hip adduction      Hip internal rotation      Hip external rotation      Knee extension      Knee flexion      Ankle dorsiflexion      Ankle plantarflexion      Ankle inversion      Ankle eversion       (Blank rows = not tested, N = WNL, * = concordant pain with testing)  Functional Tests  Eval    6 MWT: unable to participate d/t pain                                                          PATIENT SURVEYS:  LEFS: 16/80   TODAY'S TREATMENT:  [facility] Adult PT Treatment  07/29/24:  Therapeutic Exercise: Bike - 6 min - L4 Seated clam Staggered bridge    Therapeutic Activity Lateral walking with YTB at knees Standing hip abd with YTB at knees - unable to tolerate CC Staggered STS from slightly raised table - 2x10 With GTB at knees Step up - 4'' - 10x ea fwd and lat w/ march  [facility] Adult PT Treatment:                                                DATE: 07/15/24 Hamstring stretch PNF contract/ relax with RLE LAD RLE with oscillation  SLR 2 x 12 RLE only LTR 2 x 10  Nu-step L 5 x 63m in LE only Lateral stepping with setting pelvic height first and holding before stepping over with hurdles x 6 in // Rhomberg balance on airex pad with minimal postural sway noted 3 x 30 sec. Added head turns with increased postural sway noted.   [facility] Adult PT Treatment  07/10/2024:  Therapeutic Exercise: Bike - 6 min - L3 Knee ext machine - 10# - 2x10 - SL Knee flexion machine - 20# - 3x10  Therapeutic Activity  Standing hip 3 way abd and ext - 2x10 ea - mirror for visual feedback - attempted R UE support only but unable to maintain neutral pelvis d/t LBP and R hip weakness Slow march with concentration on neutral pelvis - bil UE support Hip hike on step with mirror - 2'' step - requires bil UE support Hip abd circles - 10x ea bil   [facility] Adult PT Treatment  07/04/2024:  Therapeutic Exercise: Bike - 6 min - L3 Knee ext machine - 10# - 2x10 - SL Knee flexion machine - 20# - 3x10  Therapeutic Activity  Calf raises - 2x15  Standing hip 3 way abd and ext - 2x10 ea - mirror for visual feedback Checking and updating goals including 6 MWT; reviewing with pt  [facility] Adult PT Treatment:                                                DATE: 07/02/24 Nu-step L5 x x 5 min LE only Standing hip abduction/ ext 2 x 10 performed bil Sit to stand 1 x 5, 2 x 5 with LLE advanced 1 inch Knee ext machine - 2 x 10 10# single leg  Knee flexion machine - 2 x 10 20#with bil LE Standing hip hikes standing on LLE with HHA from chair 2 x 10  [facility] Adult PT Treatment   06/24/2024:  Therapeutic Exercise: Bike - 6 min - L3 Knee ext machine - 10# - 3x10 Knee flexion machine - 20# - 3x10  Therapeutic Activity  Calf raises - 2x15 Standing hip abd and ext - 2x10 ea Standing march on foam - 2x10 ea Step up to foam - 3x5 ea Lateral walking at counter    HOME EXERCISE PROGRAM: Access Code: [email or web address] URL: [email or web address] Date: 06/19/2024 Prepared by: Helene Gasmen  Exercises - Standing Hip Abduction with Counter Support  - 1 x daily - 7 x weekly - 3 sets - 10 reps - Standing Hip Extension with Counter Support  - 1 x daily - 7 x weekly - 3 sets - 10 reps - Mini Squat with Counter Support  - 1 x daily - 7 x weekly - 3 sets - 10 reps - Standing Hip Flexion with Counter Support  - 1 x daily - 7 x weekly - 3 sets - 10 reps   Treatment priorities   Eval 9/18        Hip abd strength cc/oc        L foot drop                                 ASSESSMENT:  CLINICAL IMPRESSION:  07/29/2024: Andrew [name] tolerated session well with no adverse reaction.  Continuing to work on lateral hip strengthening to normalize gait.  Pt is progressing OC lateral hip strength but is really unable to tolerate any CC work in SLS d/t severe R low back pain.  Will continue to work on functional movements and OC strengthening.  Pt to see spine specialist next week and ortho follow up tomorrow.   Andrew [name] is a 89 y.o. male who presents to clinic with signs and sxs consistent with R hip pain following glute min repair on 7/15.   Still having high pain levels but only about 1 week out at this point.  Started with some sub max isometrics and gentle ROM.  Did not complete strength testing d/t precautions and pain.   Andrew [name] will benefit from skilled PT to address relevant deficits and improve ability to complete daily tasks such as walking, standing, and bending in order to care for himself and his wife.   OBJECTIVE IMPAIRMENTS: Pain, hip ROM, hip strength, gait,  balance  ACTIVITY LIMITATIONS: walking, standing, bending, limiting, squatting  PERSONAL FACTORS: See medical history and pertinent history   REHAB POTENTIAL: Good  CLINICAL DECISION MAKING: Evolving/moderate complexity  EVALUATION COMPLEXITY: Moderate   GOALS:   SHORT TERM GOALS: Target  date: 06/10/2024   Andrew [name] will be >75% HEP compliant to improve carryover between sessions and facilitate independent management of condition  Evaluation: ongoing Goal status: MET   LONG TERM GOALS: Target date: 07/08/2024 extended to 08/29/2024    Andrew [name] will self report >/= 50% decrease in pain from evaluation to improve function in daily tasks  Evaluation/Baseline: 8/10 max pain 9/12: 2/10 Goal status: MET   2.  Andrew [name] will show a >/= 27 pt improvement in LEFS score (MCID is ~11% or 9 pts) as a proxy for functional improvement   Evaluation/Baseline: 16/80 pts 9/12: 34/80 Goal status: ongoing   3.  Andrew [name] will be able to complete household tasks including caring for his wife, not limited by pain  Evaluation/Baseline: limited 9/12: Significantly improved, but still limited in more vigorous tasks like taking trash to road Goal status: ONGOING  4.  Andrew [name] will improve 6 minute walk test to age adjusted norm (MCID 50 m).  Evaluation/Baseline: not tested 9/12: 630 ft - significant trendelenburg  Goal status: INITIAL     5.  Andrew [name] will improve 30'' STS (MCID 2) to >/= 10x (w/ UE?: N) to show improved LE strength and improved transfers   Evaluation/Baseline: not tested 9/12: 5x w/ UE Goal status: Ongoing   PLAN: PT FREQUENCY: 1-2x/week  PT DURATION: 8 weeks  PLANNED INTERVENTIONS:  97164- PT Re-evaluation, 97110-Therapeutic exercises, 97530- Therapeutic activity, 97112- Neuromuscular re-education, 97535- Self Care, 02859- Manual therapy, [id number]- Gait training, [id number]- Aquatic Therapy, [phone]- Electrical stimulation (manual), [id number]- Vasopneumatic device, [id number]- Traction  (mechanical), [id number]- Ionotophoresis 4mg /ml Dexamethasone , Taping, Dry Needling, Joint manipulation, and Spinal manipulation.  Helene BRAVO Kasheem Toner PT 07/29/24  4:02 PM        Name: Darren Caldron  MRN: [id number] 12 visits    Check all possible CPT codes: 02889- Therapeutic Exercise, [phone]- Neuro Re-education, [id number] - Gait Training, [phone] - Manual Therapy, 97530 - Therapeutic Activities, 97535 - Self Care, [phone] - Re-evaluation, [id number] - Mechanical traction, and [id number] - Aquatic therapy   Thank you!  MCD - Secure

## 2024-07-30 ENCOUNTER — Ambulatory Visit (INDEPENDENT_AMBULATORY_CARE_PROVIDER_SITE_OTHER): Admitting: Orthopaedic Surgery

## 2024-07-30 ENCOUNTER — Telehealth: Payer: Self-pay | Admitting: Orthopaedic Surgery

## 2024-07-30 DIAGNOSIS — S76011A Strain of muscle, fascia and tendon of right hip, initial encounter: Secondary | ICD-10-CM

## 2024-07-30 DIAGNOSIS — M5416 Radiculopathy, lumbar region: Secondary | ICD-10-CM

## 2024-07-30 NOTE — Telephone Encounter (Signed)
 Patient has a Visual merchandiser and can not go [facility] for Mri can this be sent to the hospital

## 2024-07-30 NOTE — Addendum Note (Signed)
 Addended by: WOLFGANG CONLEY HERO on: 07/30/2024 12:20 PM   Modules accepted: Orders

## 2024-07-30 NOTE — Progress Notes (Signed)
 Post Operative Evaluation    Procedure/Date of Surgery: Right hip gluteus maximus tendon transfer 7/15  Interval History:    Presents 12 weeks status post above procedure.  Overall he is doing quite well.  At this time he is quite pleased with his outcome.  He does not have any type of residual pain in the hip.  He is still having radiating pain down the left lower extremity.  This has now progressed to numbness.  He does have a history of a lumbar decompression with progressive symptoms.  He has been getting lumbar injections and he has now had dozens which are not providing any relief   PMH/PSH/Family History/Social History/Meds/Allergies:    Past Medical History:  Diagnosis Date   Chronic kidney disease    CKD stg 3b   Colonic polyp 2011 last colo   colo q 56yr - [facility]    Coronary artery disease 02/2009   a. 02/2009 Cath: severe left main and three-vessel-->CABG; b. 12/2016 MVL EF 49%, no isch/infarct.   DEGENERATIVE JOINT DISEASE    Diastolic dysfunction    a. 10/2021 Echo: EF 55-60%, no rwma, mod conc LVH, GrI DD, mildly reduced RV fxn, RVSP 26.8 mmHg. mild AI, and aortic sclerosis.   Diverticulosis of colon    DIVERTICULOSIS, COLON    GERD (gastroesophageal reflux disease)    History of kidney stones    HYPERLIPIDEMIA-MIXED    HYPERTENSION, BENIGN    Interstitial lung disease (HCC)    LBP (low back pain)    s/p decompression 8/14; ESI - [place]; RFA x 3 early 2015   Pacemaker    a. 05/2017: St. Jude (serial number [id number]) pacemaker for CHB   Symptomatic bradycardia    a. s/p St. Jude (serial number [id number]) pacemaker 06/16/17   Type II or unspecified type diabetes mellitus without mention of complication, not stated as uncontrolled    VITAMIN D  DEFICIENCY    Past Surgical History:  Procedure Laterality Date   CARPAL TUNNEL RELEASE Right    CATARACT EXTRACTION, BILATERAL     R 07/07/13, L 07/21/13   CORONARY ARTERY BYPASS GRAFT  03/17/09    x 4   CYSTOSCOPY WITH STENT PLACEMENT Left 05/10/2021   Procedure: CYSTOSCOPY WITH LEFT  STENT PLACEMENT,LEFT RETROGRADE PYELOGRAM;  Surgeon: Cam Morene ORN, MD;  Location: [facility];  Service: Urology;  Laterality: Left;   CYSTOSCOPY/URETEROSCOPY/HOLMIUM LASER/STENT PLACEMENT Left 06/03/2021   Procedure: CYSTOSCOPY, LEFT RETROGRADE, URETEROSCOPY,HOLMIUM LASER,STENT EXCHANGE;  Surgeon: Cam Morene ORN, MD;  Location: [facility];  Service: Urology;  Laterality: Left;   GLUTEUS MINIMUS REPAIR Right 05/06/2024   Procedure: REPAIR, TENDON, GLUTEUS MINIMUS;  Surgeon: Genelle Standing, MD;  Location: [facility];  Service: Orthopedics;  Laterality: Right;  RIGHT GLUTEUS MAXIMUS TENDON TRANSFER   INGUINAL HERNIA REPAIR  1980   KNEE ARTHROSCOPY  2007   right   LUMBAR EPIDURAL INJECTION  2013   Has as needed.   LUMBAR LAMINECTOMY/DECOMPRESSION MICRODISCECTOMY N/A 06/19/2013   Procedure: LUMBAR LAMINECTOMY/DECOMPRESSION MICRODISCECTOMY;  Surgeon: Oneil Rodgers Priestly, MD;  Location: [facility] OR;  Service: Orthopedics;  Laterality: N/A;  Lumbar 4-5 decompression   PACEMAKER IMPLANT N/A 06/16/2017   Procedure: Pacemaker Implant;  Surgeon: Waddell Danelle ORN, MD;  Location: [facility] INVASIVE CV LAB;  Service: Cardiovascular;  Laterality: N/A;   TRIGGER FINGER RELEASE  Right    Social History   Socioeconomic History   Marital status: Married    Spouse name: Darice   Number of children: 3   Years of education: Not on file   Highest education level: 12th grade  Occupational History   Not on file  Tobacco Use   Smoking status: Never   Smokeless tobacco: Never   Tobacco comments:    Married, 3 grown children. Retired 04/2002 from [employer] work  Psychologist, educational Use   Vaping status: Never Used  Substance and Sexual Activity   Alcohol use: Yes    Comment: RARE   Drug use: No   Sexual activity: Not Currently  Other Topics Concern   Not on file  Social History Narrative   Lives with wife and  1 son.   Social Drivers of Corporate investment banker Strain: Low Risk  (12/18/2023)   Overall Financial Resource Strain (CARDIA)    Difficulty of Paying Living Expenses: Not hard at all  Food Insecurity: No Food Insecurity (12/18/2023)   Hunger Vital Sign    Worried About Running Out of Food in the Last Year: Never true    Ran Out of Food in the Last Year: Never true  Transportation Needs: No Transportation Needs (12/18/2023)   PRAPARE - Administrator, Civil Service (Medical): No    Lack of Transportation (Non-Medical): No  Physical Activity: Inactive (12/18/2023)   Exercise Vital Sign    Days of Exercise per Week: 0 days    Minutes of Exercise per Session: 20 min  Stress: No Stress Concern Present (12/18/2023)   [employer] of Occupational Health - Occupational Stress Questionnaire    Feeling of Stress : Not at all  Social Connections: Socially Isolated (12/18/2023)   Social Connection and Isolation Panel    Frequency of Communication with Friends and Family: Once a week    Frequency of Social Gatherings with Friends and Family: Once a week    Attends Religious Services: Never    Database administrator or Organizations: No    Attends Engineer, structural: Never    Marital Status: Married   Family History  Problem Relation Age of Onset   Heart failure Mother        died age 38   Heart failure Father        died age 36   Diabetes Other    Heart disease Neg Hx    Allergies  Allergen Reactions   Penicillins Other (See Comments) and Anaphylaxis    Taste buds   Current Outpatient Medications  Medication Sig Dispense Refill   Acetaminophen  (TYLENOL  8 HOUR PO) Take 1,000 mg by mouth 3 (three) times daily.     aspirin  EC 325 MG tablet Take 1 tablet (325 mg total) by mouth daily. 14 tablet 0   atorvastatin  (LIPITOR) 20 MG tablet TAKE 1 TABLET DAILY AT     6:00PM 90 tablet 1   carvedilol  (COREG ) 6.25 MG tablet Take 1 tablet (6.25 mg total) by mouth 2  (two) times daily with a meal. 180 tablet 3   cholecalciferol  (VITAMIN D ) 1000 UNITS tablet Take 1,000 Units by mouth daily.     doxycycline  (VIBRA -TABS) 100 MG tablet Take 1 tablet (100 mg total) by mouth 2 (two) times daily. 20 tablet 0   DULoxetine (CYMBALTA) 30 MG capsule Take 30 mg by mouth every morning.     metFORMIN  (GLUCOPHAGE -XR) 500 MG 24 hr tablet TAKE 1 TABLET  TWICE A DAY 180 tablet 3   Multiple Vitamin (MULTIVITAMIN WITH MINERALS) TABS tablet Take 1 tablet by mouth daily.     niacin  (NIASPAN ) 1000 MG CR tablet TAKE 1 TABLET AT BEDTIME 90 tablet 3   TURMERIC CURCUMIN PO Take 2,000 mg by mouth in the morning and at bedtime.     warfarin (JANTOVEN ) 2 MG tablet TAKE 1 TO 2 TABLETS DAILY AS DIRECTED BY COUMADIN  CLINIC 90 tablet 1   No current facility-administered medications for this visit.   No results found.  Review of Systems:   A ROS was performed including pertinent positives and negatives as documented in the HPI.   Musculoskeletal Exam:    There were no vitals taken for this visit.  Right hip incision is well-appearing without erythema or drainage.  Walks with a mildly antalgic gait.  Distal neurosensory exam is intact 30 degrees internal/external rotation of the right hip without pain  Positive straight leg raise particularly on the left.  Equivocally positive on the right.  There is numbness in the L5 distribution on the left.  Strength is otherwise normal with an antalgic gait  Imaging:      I personally reviewed and interpreted the radiographs.   Assessment:   Presents 12weeks status post right hip gluteus maximus tendon transfer overall doing well.  At this time he is having significant symptoms down the left leg and numbness which I do believe are related to his lumbar spine.  He has trialed physical therapy and injections without relief.  Given this we will plan for an MRI of the lumbar spine I will follow him back to discuss results  Plan :    - Plan for  MRI lumbar spine and follow-up to discuss results      I personally saw and evaluated the patient, and participated in the management and treatment plan.  Elspeth Parker, MD Attending Physician, Orthopedic Surgery  This document was dictated using Dragon voice recognition software. A reasonable attempt at proof reading has been made to minimize errors.

## 2024-07-31 ENCOUNTER — Ambulatory Visit: Admitting: Physical Therapy

## 2024-07-31 ENCOUNTER — Encounter: Payer: Self-pay | Admitting: Physical Therapy

## 2024-07-31 DIAGNOSIS — M6281 Muscle weakness (generalized): Secondary | ICD-10-CM

## 2024-07-31 DIAGNOSIS — M25551 Pain in right hip: Secondary | ICD-10-CM

## 2024-07-31 DIAGNOSIS — R2681 Unsteadiness on feet: Secondary | ICD-10-CM

## 2024-07-31 DIAGNOSIS — M5459 Other low back pain: Secondary | ICD-10-CM

## 2024-07-31 NOTE — Therapy (Signed)
 DAILY NOTE    Patient Name: Andrew [name] MRN: [id number] DOB:20-Sep-1934, 89 y.o., male Today's Date: 07/31/2024   PT End of Session - 07/31/24 0716     Visit Number 16    Number of Visits 18    Date for Recertification  08/29/24    Authorization Time Period Approved 8 PT visits from 07/07/24-08/18/24    Authorization - Visit Number 6    Authorization - Number of Visits 8    Progress Note Due on Visit 20    PT Start Time 0715    PT Stop Time 0756    PT Time Calculation (min) 41 min    Activity Tolerance Patient tolerated treatment well    Behavior During Therapy [facility] for tasks assessed/performed            Past Medical History:  Diagnosis Date   Chronic kidney disease    CKD stg 3b   Colonic polyp 2011 last colo   colo q 50yr - [facility]    Coronary artery disease 02/2009   a. 02/2009 Cath: severe left main and three-vessel-->CABG; b. 12/2016 MVL EF 49%, no isch/infarct.   DEGENERATIVE JOINT DISEASE    Diastolic dysfunction    a. 10/2021 Echo: EF 55-60%, no rwma, mod conc LVH, GrI DD, mildly reduced RV fxn, RVSP 26.8 mmHg. mild AI, and aortic sclerosis.   Diverticulosis of colon    DIVERTICULOSIS, COLON    GERD (gastroesophageal reflux disease)    History of kidney stones    HYPERLIPIDEMIA-MIXED    HYPERTENSION, BENIGN    Interstitial lung disease (HCC)    LBP (low back pain)    s/p decompression 8/14; ESI - [place]; RFA x 3 early 2015   Pacemaker    a. 05/2017: St. Jude (serial number [id number]) pacemaker for CHB   Symptomatic bradycardia    a. s/p St. Jude (serial number [id number]) pacemaker 06/16/17   Type II or unspecified type diabetes mellitus without mention of complication, not stated as uncontrolled    VITAMIN D  DEFICIENCY    Past Surgical History:  Procedure Laterality Date   CARPAL TUNNEL RELEASE Right    CATARACT EXTRACTION, BILATERAL     R 07/07/13, L 07/21/13   CORONARY ARTERY BYPASS GRAFT  03/17/09   x 4   CYSTOSCOPY WITH STENT PLACEMENT Left  05/10/2021   Procedure: CYSTOSCOPY WITH LEFT  STENT PLACEMENT,LEFT RETROGRADE PYELOGRAM;  Surgeon: Cam Morene ORN, MD;  Location: [facility];  Service: Urology;  Laterality: Left;   CYSTOSCOPY/URETEROSCOPY/HOLMIUM LASER/STENT PLACEMENT Left 06/03/2021   Procedure: CYSTOSCOPY, LEFT RETROGRADE, URETEROSCOPY,HOLMIUM LASER,STENT EXCHANGE;  Surgeon: Cam Morene ORN, MD;  Location: [facility];  Service: Urology;  Laterality: Left;   GLUTEUS MINIMUS REPAIR Right 05/06/2024   Procedure: REPAIR, TENDON, GLUTEUS MINIMUS;  Surgeon: Genelle Standing, MD;  Location: [facility];  Service: Orthopedics;  Laterality: Right;  RIGHT GLUTEUS MAXIMUS TENDON TRANSFER   INGUINAL HERNIA REPAIR  1980   KNEE ARTHROSCOPY  2007   right   LUMBAR EPIDURAL INJECTION  2013   Has as needed.   LUMBAR LAMINECTOMY/DECOMPRESSION MICRODISCECTOMY N/A 06/19/2013   Procedure: LUMBAR LAMINECTOMY/DECOMPRESSION MICRODISCECTOMY;  Surgeon: Oneil Rodgers Priestly, MD;  Location: [facility] OR;  Service: Orthopedics;  Laterality: N/A;  Lumbar 4-5 decompression   PACEMAKER IMPLANT N/A 06/16/2017   Procedure: Pacemaker Implant;  Surgeon: Waddell Danelle ORN, MD;  Location: [facility] INVASIVE CV LAB;  Service: Cardiovascular;  Laterality: N/A;   TRIGGER FINGER RELEASE Right    Patient Active Problem List  Diagnosis Date Noted   Left leg weakness 07/22/2024   Left leg cellulitis 06/26/2024   Weight loss 04/01/2024   Tear of right gluteus minimus tendon 12/21/2023   Anemia 04/02/2023   Hematoma 03/18/2023   Supratherapeutic INR 03/18/2023   Pulmonary fibrosis (HCC) 04/02/2022   Poor balance 12/28/2021   Bilateral leg weakness 04/01/2021   Chronic kidney disease, stage 3b (HCC) 03/31/2021   Pacemaker 07/21/2019   History of deep venous thrombosis (DVT) of distal vein of left lower extremity 08/28/2017   Complete heart block (HCC) - PPM 06/15/2017   Wears hearing aid 09/25/2016   Degeneration of lumbar or lumbosacral intervertebral disc  03/23/2015   Vitamin D  deficiency 11/03/2010   Diverticulosis of colon 11/03/2010   Osteoarthritis (arthritis due to wear and tear of joints) 11/03/2010   Dyslipidemia, goal LDL below 70 12/01/2009   CAD, ARTERY BYPASS GRAFT 12/01/2009   DOE (dyspnea on exertion) 12/01/2009   Diabetes mellitus due to underlying condition with stage 3b chronic kidney disease, without long-term current use of insulin  (HCC)     PCP: Geofm Glade PARAS, MD  REFERRING PROVIDER: Genelle Standing, MD  THERAPY DIAG:  Pain in right hip  Muscle weakness  Unsteadiness on feet  Other low back pain  REFERRING DIAG: Tear of right gluteus minimus tendon, initial encounter [S76.011A]   Rationale for Evaluation and Treatment:  Rehabilitation  SUBJECTIVE:  PERTINENT PAST HISTORY:  Right hip gluteus maximus tendon transfer and Right hip open trochanteric bursectomy 7/15 for glute med repair, DM, CAD with bypass 2017, DOE, laminectomy, pacemaker, hx of DVT, blood thinner       PRECAUTIONS: see protocol below  WEIGHT BEARING RESTRICTIONS   WBAT per OP Note  OP Date: 05/06/2024  2 weeks 05/20/2024  4 weeks 06/03/2024  6 weeks 06/17/2024  8 weeks 07/01/2024  10 weeks 07/15/2024  12 weeks 07/29/2024   Glute med protocol:   [email or web address]  FALLS:  Has patient fallen in last 6 months? No, Number of falls: 0  MOI/History of condition:  Onset date: 7/15  SUBJECTIVE STATEMENT  07/31/2024: Pt reports he was released from ortho office.  Plans to have MRI of low back in the next few weeks.  EVAL: Andrew [name] is a 89 y.o. male who presents to clinic with chief complaint of chronic R hip pain and stiffness.  Well known to this PT and clinic.  Chronic glute min/med tear which was was repaired on 7/15 using glute max tendon transfer.  He has had quite a bit of pain  since the surgery but this is improving.  1 dressing change with no current drainage.  Has son at home who has been helpful.   Red flags:  denies malaise, erythema / streaking, and chills / night sweats  Pain:  Are you having pain? Yes Pain location: R hip pain NPRS scale: 0-3/10 Aggravating factors: walking, standing  Relieving factors: rest, sitting Pain description: intermittent  Occupation: retired   Education administrator: [employer]   Hand Dominance: NA  Patient Goals/Specific Activities: reduce pain, improve ability to complete ADLs   OBJECTIVE:   GENERAL OBSERVATION/GAIT: Antalgic gait with reduced time in stance on R  LE MMT:  MMT Right (Eval) Left (Eval)  Hip flexion (L2, L3)    Knee extension (L3)    Knee flexion    Hip abduction    Hip extension    Hip external rotation    Hip internal rotation    Hip adduction    Ankle  dorsiflexion (L4)    Ankle plantarflexion (S1)    Ankle inversion    Ankle eversion    Great Toe ext (L5)    Grossly     (Blank rows = not tested, score listed is out of 5 possible points.  N = WNL, D = diminished, C = clear for gross weakness with myotome testing, * = concordant pain with testing)  LE ROM:  ROM Right (Eval) Left (Eval) Right 06/09/24 8/28  Hip flexion 90 (stopped per protocol)   Easily to 100 PROM Symmetrical   Hip extension      Hip abduction      Hip adduction      Hip internal rotation      Hip external rotation      Knee extension      Knee flexion      Ankle dorsiflexion      Ankle plantarflexion      Ankle inversion      Ankle eversion       (Blank rows = not tested, N = WNL, * = concordant pain with testing)  Functional Tests  Eval    6 MWT: unable to participate d/t pain                                                          PATIENT SURVEYS:  LEFS: 16/80   TODAY'S TREATMENT:  [facility] Adult PT Treatment  07/31/24:  Therapeutic Exercise: Bike - 6 min - L4 Staggered bridge  L S/L  clam - YTB L S/L abd - very difficult Seated hip adduction with ring - 2x10  Therapeutic Activity Standing hip abd machine - 12.5# Staggered STS from slightly raised table - 2x10 Step up - 4'' - 10x ea fwd and lat w/ march  [facility] Adult PT Treatment:                                                DATE: 07/15/24 Hamstring stretch PNF contract/ relax with RLE LAD RLE with oscillation  SLR 2 x 12 RLE only LTR 2 x 10  Nu-step L 5 x 65m in LE only Lateral stepping with setting pelvic height first and holding before stepping over with hurdles x 6 in // Rhomberg balance on airex pad with minimal postural sway noted 3 x 30 sec. Added head turns with increased postural sway noted.   [facility] Adult PT Treatment  07/10/2024:  Therapeutic Exercise: Bike - 6 min - L3 Knee ext machine - 10# - 2x10 - SL Knee flexion machine - 20# - 3x10  Therapeutic Activity  Standing hip 3 way abd and ext - 2x10 ea - mirror for visual feedback - attempted R UE support only but unable to maintain neutral pelvis d/t LBP and R hip weakness Slow march with concentration on neutral pelvis - bil UE support Hip hike on step with mirror - 2'' step - requires bil UE support Hip abd circles - 10x ea bil   [facility] Adult PT Treatment  07/04/2024:  Therapeutic Exercise: Bike - 6 min - L3 Knee ext machine - 10# - 2x10 - SL Knee flexion machine - 20# - 3x10  Therapeutic  Activity  Calf raises - 2x15 Standing hip 3 way abd and ext - 2x10 ea - mirror for visual feedback Checking and updating goals including 6 MWT; reviewing with pt  [facility] Adult PT Treatment:                                                DATE: 07/02/24 Nu-step L5 x x 5 min LE only Standing hip abduction/ ext 2 x 10 performed bil Sit to stand 1 x 5, 2 x 5 with LLE advanced 1 inch Knee ext machine - 2 x 10 10# single leg  Knee flexion machine - 2 x 10 20#with bil LE Standing hip hikes standing on LLE with HHA from chair 2 x 10  [facility] Adult PT Treatment   06/24/2024:  Therapeutic Exercise: Bike - 6 min - L3 Knee ext machine - 10# - 3x10 Knee flexion machine - 20# - 3x10  Therapeutic Activity  Calf raises - 2x15 Standing hip abd and ext - 2x10 ea Standing march on foam - 2x10 ea Step up to foam - 3x5 ea Lateral walking at counter    HOME EXERCISE PROGRAM: Access Code: [email or web address] URL: [email or web address] Date: 06/19/2024 Prepared by: Andrew [name]  Exercises - Standing Hip Abduction with Counter Support  - 1 x daily - 7 x weekly - 3 sets - 10 reps - Standing Hip Extension with Counter Support  - 1 x daily - 7 x weekly - 3 sets - 10 reps - Mini Squat with Counter Support  - 1 x daily - 7 x weekly - 3 sets - 10 reps - Standing Hip Flexion with Counter Support  - 1 x daily - 7 x weekly - 3 sets - 10 reps   Treatment priorities   Eval 9/18        Hip abd strength cc/oc        L foot drop                                 ASSESSMENT:  CLINICAL IMPRESSION:  07/31/2024: Andrew [name] tolerated session well with no adverse reaction.  Continue to focus on hip strength with some improvement in OC strength.  Still remains limited by low back pain and unable to maintain neutral pelvis in R stance w/o UE support.  Will continue to work on this in remaining 2 visits with plans to d/c with HEP following.   Andrew [name] is a 89 y.o. male who presents to clinic with signs and sxs consistent with R hip pain following glute min repair on 7/15.   Still having high pain levels but only about 1 week out at this point.  Started with some sub max isometrics and gentle ROM.  Did not complete strength testing d/t precautions and pain.   Andrew [name] will benefit from skilled PT to address relevant deficits and improve ability to complete daily tasks such as walking, standing, and bending in order to care for himself and his wife.   OBJECTIVE IMPAIRMENTS: Pain, hip ROM, hip strength, gait, balance  ACTIVITY LIMITATIONS: walking, standing, bending,  limiting, squatting  PERSONAL FACTORS: See medical history and pertinent history   REHAB POTENTIAL: Good  CLINICAL DECISION MAKING: Evolving/moderate complexity  EVALUATION COMPLEXITY: Moderate   GOALS:   SHORT TERM GOALS: Target date: 06/10/2024  Andrew [name] will be >75% HEP compliant to improve carryover between sessions and facilitate independent management of condition  Evaluation: ongoing Goal status: MET   LONG TERM GOALS: Target date: 07/08/2024 extended to 08/29/2024    Andrew [name] will self report >/= 50% decrease in pain from evaluation to improve function in daily tasks  Evaluation/Baseline: 8/10 max pain 9/12: 2/10 Goal status: MET   2.  Andrew [name] will show a >/= 27 pt improvement in LEFS score (MCID is ~11% or 9 pts) as a proxy for functional improvement   Evaluation/Baseline: 16/80 pts 9/12: 34/80 Goal status: ongoing   3.  Andrew [name] will be able to complete household tasks including caring for his wife, not limited by pain  Evaluation/Baseline: limited 9/12: Significantly improved, but still limited in more vigorous tasks like taking trash to road Goal status: ONGOING  4.  Andrew [name] will improve 6 minute walk test to age adjusted norm (MCID 50 m).  Evaluation/Baseline: not tested 9/12: 630 ft - significant trendelenburg  Goal status: INITIAL     5.  Andrew [name] will improve 30'' STS (MCID 2) to >/= 10x (w/ UE?: N) to show improved LE strength and improved transfers   Evaluation/Baseline: not tested 9/12: 5x w/ UE Goal status: Ongoing   PLAN: PT FREQUENCY: 1-2x/week  PT DURATION: 8 weeks  PLANNED INTERVENTIONS:  97164- PT Re-evaluation, 97110-Therapeutic exercises, 97530- Therapeutic activity, 97112- Neuromuscular re-education, 97535- Self Care, 02859- Manual therapy, [id number]- Gait training, [id number]- Aquatic Therapy, [phone]- Electrical stimulation (manual), [id number]- Vasopneumatic device, [id number]- Traction (mechanical), [id number]- Ionotophoresis 4mg /ml Dexamethasone , Taping, Dry  Needling, Joint manipulation, and Spinal manipulation.  Andrew [name] PT 07/31/24  8:03 AM        Name: Andrew [name]  MRN: [id number] 12 visits    Check all possible CPT codes: 02889- Therapeutic Exercise, [phone]- Neuro Re-education, [phone] - Gait Training, [id number] - Manual Therapy, 97530 - Therapeutic Activities, 97535 - Self Care, [phone] - Re-evaluation, [id number] - Mechanical traction, and [id number] - Aquatic therapy   Thank you!  MCD - Secure

## 2024-08-01 NOTE — Telephone Encounter (Signed)
 Order has been changed

## 2024-08-05 ENCOUNTER — Other Ambulatory Visit (HOSPITAL_BASED_OUTPATIENT_CLINIC_OR_DEPARTMENT_OTHER): Payer: Self-pay

## 2024-08-06 ENCOUNTER — Ambulatory Visit: Attending: Internal Medicine

## 2024-08-06 ENCOUNTER — Encounter: Payer: Self-pay | Admitting: Physical Therapy

## 2024-08-06 ENCOUNTER — Other Ambulatory Visit: Payer: Self-pay | Admitting: Internal Medicine

## 2024-08-06 ENCOUNTER — Ambulatory Visit: Admitting: Physical Therapy

## 2024-08-06 DIAGNOSIS — Z5181 Encounter for therapeutic drug level monitoring: Secondary | ICD-10-CM | POA: Diagnosis not present

## 2024-08-06 DIAGNOSIS — I82409 Acute embolism and thrombosis of unspecified deep veins of unspecified lower extremity: Secondary | ICD-10-CM | POA: Diagnosis not present

## 2024-08-06 DIAGNOSIS — M6281 Muscle weakness (generalized): Secondary | ICD-10-CM

## 2024-08-06 DIAGNOSIS — M25551 Pain in right hip: Secondary | ICD-10-CM

## 2024-08-06 DIAGNOSIS — R2681 Unsteadiness on feet: Secondary | ICD-10-CM

## 2024-08-06 LAB — POCT INR: INR: 1.8 — AB (ref 2.0–3.0)

## 2024-08-06 NOTE — Therapy (Signed)
 DAILY NOTE    Patient Name: Andrew [name] MRN: [id number] DOB:09-14-1934, 89 y.o., male [name] Date: 08/06/2024   PT End of Session - 08/06/24 0929     Visit Number 17    Number of Visits 18    Date for Recertification  08/29/24    Authorization Time Period Approved 8 PT visits from 07/07/24-08/18/24    Authorization - Visit Number 7    Authorization - Number of Visits 8    Progress Note Due on Visit 20    PT Start Time 0930    PT Stop Time 1012    PT Time Calculation (min) 42 min    Activity Tolerance Patient tolerated treatment well    Behavior During Therapy [facility] for tasks assessed/performed            Past Medical History:  Diagnosis Date   Chronic kidney disease    CKD stg 3b   Colonic polyp 2011 last colo   colo q 41yr - [facility]    Coronary artery disease 02/2009   a. 02/2009 Cath: severe left main and three-vessel-->CABG; b. 12/2016 MVL EF 49%, no isch/infarct.   DEGENERATIVE JOINT DISEASE    Diastolic dysfunction    a. 10/2021 Echo: EF 55-60%, no rwma, mod conc LVH, GrI DD, mildly reduced RV fxn, RVSP 26.8 mmHg. mild AI, and aortic sclerosis.   Diverticulosis of colon    DIVERTICULOSIS, COLON    GERD (gastroesophageal reflux disease)    History of kidney stones    HYPERLIPIDEMIA-MIXED    HYPERTENSION, BENIGN    Interstitial lung disease (HCC)    LBP (low back pain)    Andrew/p decompression 8/14; ESI - [place]; RFA x 3 early 2015   Pacemaker    a. 05/2017: St[employer] Jude (serial number [id number]) pacemaker for CHB   Symptomatic bradycardia    a. Andrew/p St. Jude (serial number [id number]) pacemaker 06/16/17   Type II or unspecified type diabetes mellitus without mention of complication, not stated as uncontrolled    VITAMIN D  DEFICIENCY    Past Surgical History:  Procedure Laterality Date   CARPAL TUNNEL RELEASE Right    CATARACT EXTRACTION, BILATERAL     R 07/07/13, L 07/21/13   CORONARY ARTERY BYPASS GRAFT  03/17/09   x 4   CYSTOSCOPY WITH STENT PLACEMENT Left  05/10/2021   Procedure: CYSTOSCOPY WITH LEFT  STENT PLACEMENT,LEFT RETROGRADE PYELOGRAM;  Surgeon: Cam Morene ORN, MD;  Location: [facility];  Service: Urology;  Laterality: Left;   CYSTOSCOPY/URETEROSCOPY/HOLMIUM LASER/STENT PLACEMENT Left 06/03/2021   Procedure: CYSTOSCOPY, LEFT RETROGRADE, URETEROSCOPY,HOLMIUM LASER,STENT EXCHANGE;  Surgeon: Cam Morene ORN, MD;  Location: [facility];  Service: Urology;  Laterality: Left;   GLUTEUS MINIMUS REPAIR Right 05/06/2024   Procedure: REPAIR, TENDON, GLUTEUS MINIMUS;  Surgeon: Genelle Standing, MD;  Location: [facility];  Service: Orthopedics;  Laterality: Right;  RIGHT GLUTEUS MAXIMUS TENDON TRANSFER   INGUINAL HERNIA REPAIR  1980   KNEE ARTHROSCOPY  2007   right   LUMBAR EPIDURAL INJECTION  2013   Has as needed.   LUMBAR LAMINECTOMY/DECOMPRESSION MICRODISCECTOMY N/A 06/19/2013   Procedure: LUMBAR LAMINECTOMY/DECOMPRESSION MICRODISCECTOMY;  Surgeon: Oneil Rodgers Priestly, MD;  Location: [facility];  Service: Orthopedics;  Laterality: N/A;  Lumbar 4-5 decompression   PACEMAKER IMPLANT N/A 06/16/2017   Procedure: Pacemaker Implant;  Surgeon: Waddell Danelle ORN, MD;  Location: [facility] INVASIVE CV LAB;  Service: Cardiovascular;  Laterality: N/A;   TRIGGER FINGER RELEASE Right    Patient Active Problem List  Diagnosis Date Noted   Left leg weakness 07/22/2024   Left leg cellulitis 06/26/2024   Weight loss 04/01/2024   Tear of right gluteus minimus tendon 12/21/2023   Anemia 04/02/2023   Hematoma 03/18/2023   Supratherapeutic INR 03/18/2023   Pulmonary fibrosis (HCC) 04/02/2022   Poor balance 12/28/2021   Bilateral leg weakness 04/01/2021   Chronic kidney disease, stage 3b (HCC) 03/31/2021   Pacemaker 07/21/2019   History of deep venous thrombosis (DVT) of distal vein of left lower extremity 08/28/2017   Complete heart block (HCC) - PPM 06/15/2017   Wears hearing aid 09/25/2016   Degeneration of lumbar or lumbosacral intervertebral disc  03/23/2015   Vitamin D  deficiency 11/03/2010   Diverticulosis of colon 11/03/2010   Osteoarthritis (arthritis due to wear and tear of joints) 11/03/2010   Dyslipidemia, goal LDL below 70 12/01/2009   CAD, ARTERY BYPASS GRAFT 12/01/2009   DOE (dyspnea on exertion) 12/01/2009   Diabetes mellitus due to underlying condition with stage 3b chronic kidney disease, without long-term current use of insulin  (HCC)     PCP: Geofm Glade PARAS, MD  REFERRING PROVIDER: Genelle Standing, MD  THERAPY DIAG:  Pain in right hip  Muscle weakness  Unsteadiness on feet  REFERRING DIAG: Tear of right gluteus minimus tendon, initial encounter [S76.011A]   Rationale for Evaluation and Treatment:  Rehabilitation  SUBJECTIVE:  PERTINENT PAST HISTORY:  Right hip gluteus maximus tendon transfer and Right hip open trochanteric bursectomy 7/15 for glute med repair, DM, CAD with bypass 2017, DOE, laminectomy, pacemaker, hx of DVT, blood thinner       PRECAUTIONS: see protocol below  WEIGHT BEARING RESTRICTIONS   WBAT per OP Note  OP Date: 05/06/2024  2 weeks 05/20/2024  4 weeks 06/03/2024  6 weeks 06/17/2024  8 weeks 07/01/2024  10 weeks 07/15/2024  12 weeks 07/29/2024   Glute med protocol:   [email or web address]  FALLS:  Has patient fallen in last 6 months? No, Number of falls: 0  MOI/History of condition:  Onset date: 7/15  SUBJECTIVE STATEMENT  08/06/2024: Pt reports he had another injection and his back pain is now a 2/10.  EVAL: Andrew [name] is a 89 y.o. male who presents to clinic with chief complaint of chronic R hip pain and stiffness.  Well known to this PT and clinic.  Chronic glute min/med tear which was was repaired on 7/15 using glute max tendon transfer.  He has had quite a bit of pain since the surgery but this is improving.  1 dressing  change with no current drainage.  Has son at home who has been helpful.   Red flags:  denies malaise, erythema / streaking, and chills / night sweats  Pain:  Are you having pain? Yes Pain location: R hip pain NPRS scale: 0-3/10 Aggravating factors: walking, standing  Relieving factors: rest, sitting Pain description: intermittent  Occupation: retired   Education administrator: [employer]   Hand Dominance: NA  Patient Goals/Specific Activities: reduce pain, improve ability to complete ADLs   OBJECTIVE:   GENERAL OBSERVATION/GAIT: Antalgic gait with reduced time in stance on R  LE MMT:  MMT Right (Eval) Left (Eval)  Hip flexion (L2, L3)    Knee extension (L3)    Knee flexion    Hip abduction    Hip extension    Hip external rotation    Hip internal rotation    Hip adduction    Ankle dorsiflexion (L4)    Ankle plantarflexion (S1)    Ankle  inversion    Ankle eversion    Great Toe ext (L5)    Grossly     (Blank rows = not tested, score listed is out of 5 possible points.  N = WNL, D = diminished, C = clear for gross weakness with myotome testing, * = concordant pain with testing)  LE ROM:  ROM Right (Eval) Left (Eval) Right 06/09/24 8/28  Hip flexion 90 (stopped per protocol)   Easily to 100 PROM Symmetrical   Hip extension      Hip abduction      Hip adduction      Hip internal rotation      Hip external rotation      Knee extension      Knee flexion      Ankle dorsiflexion      Ankle plantarflexion      Ankle inversion      Ankle eversion       (Blank rows = not tested, N = WNL, * = concordant pain with testing)  Functional Tests  Eval    6 MWT: unable to participate d/t pain                                                          PATIENT SURVEYS:  LEFS: 16/80   [name] TREATMENT:  [facility] Adult PT Treatment  08/06/24:  Therapeutic Exercise: Bike - 6 min - L4 L Andrew/L clam - YTB L Andrew/L abd - w/ pt assist  Therapeutic Activity Standing hip  abd - YTB at knees - 2x10 ea STS R foot 2'' elevated - 3x10 Step up - 4'' - 10x ea fwd and lat w/ march  [facility] Adult PT Treatment:                                                DATE: 07/15/24 Hamstring stretch PNF contract/ relax with RLE LAD RLE with oscillation  SLR 2 x 12 RLE only LTR 2 x 10  Nu-step L 5 x 12m in LE only Lateral stepping with setting pelvic height first and holding before stepping over with hurdles x 6 in // Rhomberg balance on airex pad with minimal postural sway noted 3 x 30 sec. Added head turns with increased postural sway noted.   [facility] Adult PT Treatment  07/10/2024:  Therapeutic Exercise: Bike - 6 min - L3 Knee ext machine - 10# - 2x10 - SL Knee flexion machine - 20# - 3x10  Therapeutic Activity  Standing hip 3 way abd and ext - 2x10 ea - mirror for visual feedback - attempted R UE support only but unable to maintain neutral pelvis d/t LBP and R hip weakness Slow march with concentration on neutral pelvis - bil UE support Hip hike on step with mirror - 2'' step - requires bil UE support Hip abd circles - 10x ea bil   [facility] Adult PT Treatment  07/04/2024:  Therapeutic Exercise: Bike - 6 min - L3 Knee ext machine - 10# - 2x10 - SL Knee flexion machine - 20# - 3x10  Therapeutic Activity  Calf raises - 2x15 Standing hip 3 way abd and ext - 2x10 ea - mirror  for visual feedback Checking and updating goals including 6 MWT; reviewing with pt  [facility] Adult PT Treatment:                                                DATE: 07/02/24 Nu-step L5 x x 5 min LE only Standing hip abduction/ ext 2 x 10 performed bil Sit to stand 1 x 5, 2 x 5 with LLE advanced 1 inch Knee ext machine - 2 x 10 10# single leg  Knee flexion machine - 2 x 10 20#with bil LE Standing hip hikes standing on LLE with HHA from chair 2 x 10  [facility] Adult PT Treatment  06/24/2024:  Therapeutic Exercise: Bike - 6 min - L3 Knee ext machine - 10# - 3x10 Knee flexion machine - 20# -  3x10  Therapeutic Activity  Calf raises - 2x15 Standing hip abd and ext - 2x10 ea Standing march on foam - 2x10 ea Step up to foam - 3x5 ea Lateral walking at counter    HOME EXERCISE PROGRAM: Access Code: [email or web address] URL: [email or web address] Date: 06/19/2024 Prepared by: Helene Gasmen  Exercises - Standing Hip Abduction with Counter Support  - 1 x daily - 7 x weekly - 3 sets - 10 reps - Standing Hip Extension with Counter Support  - 1 x daily - 7 x weekly - 3 sets - 10 reps - Mini Squat with Counter Support  - 1 x daily - 7 x weekly - 3 sets - 10 reps - Standing Hip Flexion with Counter Support  - 1 x daily - 7 x weekly - 3 sets - 10 reps   Treatment priorities   Eval 9/18        Hip abd strength cc/oc        L foot drop                                 ASSESSMENT:  CLINICAL IMPRESSION:  08/06/2024: Andrew [name] tolerated session well with no adverse reaction.  Continue to work on R hip glute strength.  Pt with significantly improved tolerance today following injection for low back pain.  Still unable to support body weight in SLS without significant trendelenburg but able to complete with contralateral UE support more comfortably today.  Reviewed HEP and encouraged him to continue to work on OC hip abd at home which does show some improvement compared to previous sessions.    Andrew [name] is a 89 y.o. male who presents to clinic with signs and sxs consistent with R hip pain following glute min repair on 7/15.   Still having high pain levels but only about 1 week out at this point.  Started with some sub max isometrics and gentle ROM.  Did not complete strength testing d/t precautions and pain.   Andrew [name] will benefit from skilled PT to address relevant deficits and improve ability to complete daily tasks such as walking, standing, and bending in order to care for himself and his wife.   OBJECTIVE IMPAIRMENTS: Pain, hip ROM, hip strength, gait, balance  ACTIVITY  LIMITATIONS: walking, standing, bending, limiting, squatting  PERSONAL FACTORS: See medical history and pertinent history   REHAB POTENTIAL: Good  CLINICAL DECISION MAKING: Evolving/moderate complexity  EVALUATION COMPLEXITY: Moderate   GOALS:   SHORT TERM GOALS: Target date:  06/10/2024   Oral will be >75% HEP compliant to improve carryover between sessions and facilitate independent management of condition  Evaluation: ongoing Goal status: MET   LONG TERM GOALS: Target date: 07/08/2024 extended to 08/29/2024    Andrew [name] will self report >/= 50% decrease in pain from evaluation to improve function in daily tasks  Evaluation/Baseline: 8/10 max pain 9/12: 2/10 Goal status: MET   2.  Andrew [name] will show a >/= 27 pt improvement in LEFS score (MCID is ~11% or 9 pts) as a proxy for functional improvement   Evaluation/Baseline: 16/80 pts 9/12: 34/80 Goal status: ongoing   3.  Andrew [name] will be able to complete household tasks including caring for his wife, not limited by pain  Evaluation/Baseline: limited 9/12: Significantly improved, but still limited in more vigorous tasks like taking trash to road Goal status: ONGOING  4.  Andrew [name] will improve 6 minute walk test to age adjusted norm (MCID 50 m).  Evaluation/Baseline: not tested 9/12: 630 ft - significant trendelenburg  Goal status: INITIAL     5.  Andrew [name] will improve 30'' STS (MCID 2) to >/= 10x (w/ UE?: N) to show improved LE strength and improved transfers   Evaluation/Baseline: not tested 9/12: 5x w/ UE Goal status: Ongoing   PLAN: PT FREQUENCY: 1-2x/week  PT DURATION: 8 weeks  PLANNED INTERVENTIONS:  97164- PT Re-evaluation, 97110-Therapeutic exercises, 97530- Therapeutic activity, 97112- Neuromuscular re-education, 97535- Self Care, 02859- Manual therapy, [id number]- Gait training, [id number]- Aquatic Therapy, [phone]- Electrical stimulation (manual), [id number]- Vasopneumatic device, [id number]- Traction (mechanical), [id number]-  Ionotophoresis 4mg /ml Dexamethasone , Taping, Dry Needling, Joint manipulation, and Spinal manipulation.  Helene BRAVO Kamika Goodloe PT 08/06/24  11:01 AM        Name: Andrew [name]  MRN: [id number] 12 visits    Check all possible CPT codes: 02889- Therapeutic Exercise, [phone]- Neuro Re-education, [phone] - Gait Training, [phone] - Manual Therapy, 97530 - Therapeutic Activities, 97535 - Self Care, [phone] - Re-evaluation, [id number] - Mechanical traction, and [id number] - Aquatic therapy   Thank you!  MCD - Secure

## 2024-08-06 NOTE — Patient Instructions (Signed)
 Take 1 tablet Daily, except 2 tablets every Monday and Friday.    INR in 8 weeks.  [phone]

## 2024-08-08 ENCOUNTER — Ambulatory Visit: Admitting: Physical Therapy

## 2024-08-08 ENCOUNTER — Encounter: Payer: Self-pay | Admitting: Physical Therapy

## 2024-08-08 DIAGNOSIS — M25551 Pain in right hip: Secondary | ICD-10-CM

## 2024-08-08 DIAGNOSIS — M6281 Muscle weakness (generalized): Secondary | ICD-10-CM

## 2024-08-08 DIAGNOSIS — R2681 Unsteadiness on feet: Secondary | ICD-10-CM

## 2024-08-08 NOTE — Therapy (Signed)
 PHYSICAL THERAPY DISCHARGE SUMMARY  Visits from Start of Care: 18  Current functional level related to goals / functional outcomes: See assessment/goals   Remaining deficits: See assessment/goals   Education / Equipment: HEP and D/C plans  Patient agrees to discharge. Patient goals were partially met. Patient is being discharged due to being pleased with the current functional level.    Patient Name: Andrew [name] MRN: [id number] DOB:1934-10-15, 89 y.o., male Today's Date: 08/08/2024   PT End of Session - 08/08/24 0929     Visit Number 18    Number of Visits 18    Date for Recertification  08/29/24    Authorization Time Period Approved 8 PT visits from 07/07/24-08/18/24    Authorization - Visit Number 8    Authorization - Number of Visits 8    Progress Note Due on Visit 20    PT Start Time 0930    PT Stop Time 1012    PT Time Calculation (min) 42 min    Activity Tolerance Patient tolerated treatment well    Behavior During Therapy [facility] for tasks assessed/performed            Past Medical History:  Diagnosis Date   Chronic kidney disease    CKD stg 3b   Colonic polyp 2011 last colo   colo q 99yr - [facility]    Coronary artery disease 02/2009   a. 02/2009 Cath: severe left main and three-vessel-->CABG; b. 12/2016 MVL EF 49%, no isch/infarct.   DEGENERATIVE JOINT DISEASE    Diastolic dysfunction    a. 10/2021 Echo: EF 55-60%, no rwma, mod conc LVH, GrI DD, mildly reduced RV fxn, RVSP 26.8 mmHg. mild AI, and aortic sclerosis.   Diverticulosis of colon    DIVERTICULOSIS, COLON    GERD (gastroesophageal reflux disease)    History of kidney stones    HYPERLIPIDEMIA-MIXED    HYPERTENSION, BENIGN    Interstitial lung disease (HCC)    LBP (low back pain)    s/p decompression 8/14; ESI - [place]; RFA x 3 early 2015   Pacemaker    a. 05/2017: St. Jude (serial number [id number]) pacemaker for CHB   Symptomatic bradycardia    a. s/p St. Jude (serial number [id number])  pacemaker 06/16/17   Type II or unspecified type diabetes mellitus without mention of complication, not stated as uncontrolled    VITAMIN D  DEFICIENCY    Past Surgical History:  Procedure Laterality Date   CARPAL TUNNEL RELEASE Right    CATARACT EXTRACTION, BILATERAL     R 07/07/13, L 07/21/13   CORONARY ARTERY BYPASS GRAFT  03/17/09   x 4   CYSTOSCOPY WITH STENT PLACEMENT Left 05/10/2021   Procedure: CYSTOSCOPY WITH LEFT  STENT PLACEMENT,LEFT RETROGRADE PYELOGRAM;  Surgeon: Cam Morene ORN, MD;  Location: [facility];  Service: Urology;  Laterality: Left;   CYSTOSCOPY/URETEROSCOPY/HOLMIUM LASER/STENT PLACEMENT Left 06/03/2021   Procedure: CYSTOSCOPY, LEFT RETROGRADE, URETEROSCOPY,HOLMIUM LASER,STENT EXCHANGE;  Surgeon: Cam Morene ORN, MD;  Location: [facility];  Service: Urology;  Laterality: Left;   GLUTEUS MINIMUS REPAIR Right 05/06/2024   Procedure: REPAIR, TENDON, GLUTEUS MINIMUS;  Surgeon: Genelle Standing, MD;  Location: [facility];  Service: Orthopedics;  Laterality: Right;  RIGHT GLUTEUS MAXIMUS TENDON TRANSFER   INGUINAL HERNIA REPAIR  1980   KNEE ARTHROSCOPY  2007   right   LUMBAR EPIDURAL INJECTION  2013   Has as needed.   LUMBAR LAMINECTOMY/DECOMPRESSION MICRODISCECTOMY N/A 06/19/2013   Procedure: LUMBAR LAMINECTOMY/DECOMPRESSION MICRODISCECTOMY;  Surgeon: Oneil  Rodgers Priestly, MD;  Location: [facility] OR;  Service: Orthopedics;  Laterality: N/A;  Lumbar 4-5 decompression   PACEMAKER IMPLANT N/A 06/16/2017   Procedure: Pacemaker Implant;  Surgeon: Waddell Danelle ORN, MD;  Location[facility] INVASIVE CV LAB;  Service: Cardiovascular;  Laterality: N/A;   TRIGGER FINGER RELEASE Right    Patient Active Problem List   Diagnosis Date Noted   Left leg weakness 07/22/2024   Left leg cellulitis 06/26/2024   Weight loss 04/01/2024   Tear of right gluteus minimus tendon 12/21/2023   Anemia 04/02/2023   Hematoma 03/18/2023   Supratherapeutic INR 03/18/2023   Pulmonary fibrosis (HCC)  04/02/2022   Poor balance 12/28/2021   Bilateral leg weakness 04/01/2021   Chronic kidney disease, stage 3b (HCC) 03/31/2021   Pacemaker 07/21/2019   History of deep venous thrombosis (DVT) of distal vein of left lower extremity 08/28/2017   Complete heart block (HCC) - PPM 06/15/2017   Wears hearing aid 09/25/2016   Degeneration of lumbar or lumbosacral intervertebral disc 03/23/2015   Vitamin D  deficiency 11/03/2010   Diverticulosis of colon 11/03/2010   Osteoarthritis (arthritis due to wear and tear of joints) 11/03/2010   Dyslipidemia, goal LDL below 70 12/01/2009   CAD, ARTERY BYPASS GRAFT 12/01/2009   DOE (dyspnea on exertion) 12/01/2009   Diabetes mellitus due to underlying condition with stage 3b chronic kidney disease, without long-term current use of insulin  (HCC)     PCP: Geofm Glade PARAS, MD  REFERRING PROVIDER: Genelle Standing, MD  THERAPY DIAG:  Pain in right hip  Muscle weakness  Unsteadiness on feet  REFERRING DIAG: Tear of right gluteus minimus tendon, initial encounter [S76.011A]   Rationale for Evaluation and Treatment:  Rehabilitation  SUBJECTIVE:  PERTINENT PAST HISTORY:  Right hip gluteus maximus tendon transfer and Right hip open trochanteric bursectomy 7/15 for glute med repair, DM, CAD with bypass 2017, DOE, laminectomy, pacemaker, hx of DVT, blood thinner       PRECAUTIONS: see protocol below  WEIGHT BEARING RESTRICTIONS   WBAT per OP Note  OP Date: 05/06/2024  2 weeks 05/20/2024  4 weeks 06/03/2024  6 weeks 06/17/2024  8 weeks 07/01/2024  10 weeks 07/15/2024  12 weeks 07/29/2024   Glute med protocol:   [email or web address]  FALLS:  Has patient fallen in last 6 months? No, Number of falls: 0  MOI/History of condition:  Onset date: 7/15  SUBJECTIVE STATEMENT  08/08/2024: Pt reports he had another  injection and his back pain is now a 2/10.  EVAL: Andrew [name] is a 89 y.o. male who presents to clinic with chief complaint of chronic R hip pain and stiffness.  Well known to this PT and clinic.  Chronic glute min/med tear which was was repaired on 7/15 using glute max tendon transfer.  He has had quite a bit of pain since the surgery but this is improving.  1 dressing change with no current drainage.  Has son at home who has been helpful.   Red flags:  denies malaise, erythema / streaking, and chills / night sweats  Pain:  Are you having pain? Yes Pain location: R hip pain NPRS scale: 0-3/10 Aggravating factors: walking, standing  Relieving factors: rest, sitting Pain description: intermittent  Occupation: retired   Education administrator: [employer]   Hand Dominance: NA  Patient Goals/Specific Activities: reduce pain, improve ability to complete ADLs   OBJECTIVE:   GENERAL OBSERVATION/GAIT: Antalgic gait with reduced time in stance on R  LE MMT:  MMT Right (Eval)  Left (Eval)  Hip flexion (L2, L3)    Knee extension (L3)    Knee flexion    Hip abduction    Hip extension    Hip external rotation    Hip internal rotation    Hip adduction    Ankle dorsiflexion (L4)    Ankle plantarflexion (S1)    Ankle inversion    Ankle eversion    Great Toe ext (L5)    Grossly     (Blank rows = not tested, score listed is out of 5 possible points.  N = WNL, D = diminished, C = clear for gross weakness with myotome testing, * = concordant pain with testing)  LE ROM:  ROM Right (Eval) Left (Eval) Right 06/09/24 8/28  Hip flexion 90 (stopped per protocol)   Easily to 100 PROM Symmetrical   Hip extension      Hip abduction      Hip adduction      Hip internal rotation      Hip external rotation      Knee extension      Knee flexion      Ankle dorsiflexion      Ankle plantarflexion      Ankle inversion      Ankle eversion       (Blank rows = not tested, N = WNL, * =  concordant pain with testing)  Functional Tests  Eval    6 MWT: unable to participate d/t pain                                                          PATIENT SURVEYS:  LEFS: 16/80   TODAY'S TREATMENT:  [facility] Adult PT Treatment  08/08/24:  Therapeutic Exercise: Bike - 6 min - L4 L S/L clam - YTB L S/L abd - w/ pt assist Updating HEP  Therapeutic Activity Standing hip abd - 2x10 ea STS R foot 2'' elevated - 3x10 collecting information for goals, checking progress, and reviewing with patient  [facility] Adult PT Treatment:                                                DATE: 07/15/24 Hamstring stretch PNF contract/ relax with RLE LAD RLE with oscillation  SLR 2 x 12 RLE only LTR 2 x 10  Nu-step L 5 x 43m in LE only Lateral stepping with setting pelvic height first and holding before stepping over with hurdles x 6 in // Rhomberg balance on airex pad with minimal postural sway noted 3 x 30 sec. Added head turns with increased postural sway noted.   [facility] Adult PT Treatment  07/10/2024:  Therapeutic Exercise: Bike - 6 min - L3 Knee ext machine - 10# - 2x10 - SL Knee flexion machine - 20# - 3x10  Therapeutic Activity  Standing hip 3 way abd and ext - 2x10 ea - mirror for visual feedback - attempted R UE support only but unable to maintain neutral pelvis d/t LBP and R hip weakness Slow march with concentration on neutral pelvis - bil UE support Hip hike on step with mirror - 2'' step - requires bil UE support Hip abd circles -  10x ea bil   [facility] Adult PT Treatment  07/04/2024:  Therapeutic Exercise: Bike - 6 min - L3 Knee ext machine - 10# - 2x10 - SL Knee flexion machine - 20# - 3x10  Therapeutic Activity  Calf raises - 2x15 Standing hip 3 way abd and ext - 2x10 ea - mirror for visual feedback Checking and updating goals including 6 MWT; reviewing with pt  [facility] Adult PT Treatment:                                                DATE: 07/02/24 Nu-step L5 x x 5  min LE only Standing hip abduction/ ext 2 x 10 performed bil Sit to stand 1 x 5, 2 x 5 with LLE advanced 1 inch Knee ext machine - 2 x 10 10# single leg  Knee flexion machine - 2 x 10 20#with bil LE Standing hip hikes standing on LLE with HHA from chair 2 x 10  [facility] Adult PT Treatment  06/24/2024:  Therapeutic Exercise: Bike - 6 min - L3 Knee ext machine - 10# - 3x10 Knee flexion machine - 20# - 3x10  Therapeutic Activity  Calf raises - 2x15 Standing hip abd and ext - 2x10 ea Standing march on foam - 2x10 ea Step up to foam - 3x5 ea Lateral walking at counter    HOME EXERCISE PROGRAM: Access Code: [email or web address] URL: [email or web address] Date: 06/19/2024 Prepared by: Helene Gasmen  Exercises - Standing Hip Abduction with Counter Support  - 1 x daily - 7 x weekly - 3 sets - 10 reps - Standing Hip Extension with Counter Support  - 1 x daily - 7 x weekly - 3 sets - 10 reps - Mini Squat with Counter Support  - 1 x daily - 7 x weekly - 3 sets - 10 reps - Standing Hip Flexion with Counter Support  - 1 x daily - 7 x weekly - 3 sets - 10 reps   Treatment priorities   Eval 9/18        Hip abd strength cc/oc        L foot drop                                 ASSESSMENT:  CLINICAL IMPRESSION:  08/08/2024: Upon goal recheck for D/C today Mr. Lowrey has made significant progress toward all short and long term goals.  He shows improved LE strength and endurance, improved walking speed and capacity, minimal pain, improved LEFS, and reports he is able to complete household tasks with minimal issue.  He continues to have significant trendelenburg R hip particularly without AD or with fatigue.  He plans on continuing strengthening at home with HEP.  Has appts scheduled for his chronic low back pain coming up which has been a contributing factor to his activity tolerance.  Reviewed roll of lateral hip strength in gait and progressive strengthening strategies.     Celestino is  a 89 y.o. male who presents to clinic with signs and sxs consistent with R hip pain following glute min repair on 7/15.   Still having high pain levels but only about 1 week out at this point.  Started with some sub max isometrics and gentle ROM.  Did not complete strength testing d/t precautions and pain.  Danen will benefit from skilled PT to address relevant deficits and improve ability to complete daily tasks such as walking, standing, and bending in order to care for himself and his wife.   OBJECTIVE IMPAIRMENTS: Pain, hip ROM, hip strength, gait, balance  ACTIVITY LIMITATIONS: walking, standing, bending, limiting, squatting  PERSONAL FACTORS: See medical history and pertinent history   REHAB POTENTIAL: Good  CLINICAL DECISION MAKING: Evolving/moderate complexity  EVALUATION COMPLEXITY: Moderate   GOALS:   SHORT TERM GOALS: Target date: 06/10/2024   Mylen will be >75% HEP compliant to improve carryover between sessions and facilitate independent management of condition  Evaluation: ongoing Goal status: MET   LONG TERM GOALS: Target date: 07/08/2024 extended to 08/29/2024    Wrigley will self report >/= 50% decrease in pain from evaluation to improve function in daily tasks  Evaluation/Baseline: 8/10 max pain 9/12: 2/10 Goal status: MET   2.  Jeronimo will show a >/= 27 pt improvement in LEFS score (MCID is ~11% or 9 pts) as a proxy for functional improvement   Evaluation/Baseline: 16/80 pts 9/12: 34/80 10/17: 34/80 pts Goal status: Partially MET   3.  Dakin will be able to complete household tasks including caring for his wife, not limited by pain  Evaluation/Baseline: limited 9/12: Significantly improved, but still limited in more vigorous tasks like taking trash to road 10/17: Improved, able to complete but can be difficult d/t low back pain and L LE weakness Goal status: Partially MET  4.  Kelsey will improve 6 minute walk test to age adjusted norm (MCID 50  m).  Evaluation/Baseline: not tested 9/12: 630 ft - significant trendelenburg  10/17: 844 ft Goal status: Partially MET     5.  Ravinder will improve 30'' STS (MCID 2) to >/= 10x (w/ UE?: N) to show improved LE strength and improved transfers   Evaluation/Baseline: not tested 9/12: 5x w/ UE 10/17: 7 w/ UE support Goal status: Partially MET   PLAN: PT FREQUENCY: 1-2x/week  PT DURATION: 8 weeks  PLANNED INTERVENTIONS:  97164- PT Re-evaluation, 97110-Therapeutic exercises, 97530- Therapeutic activity, 97112- Neuromuscular re-education, 97535- Self Care, 02859- Manual therapy, [id number]- Gait training, [id number]- Aquatic Therapy, [phone]- Electrical stimulation (manual), [id number]- Vasopneumatic device, [id number]- Traction (mechanical), [id number]- Ionotophoresis 4mg /ml Dexamethasone , Taping, Dry Needling, Joint manipulation, and Spinal manipulation.  Helene BRAVO Whitney Bingaman PT 08/08/24  10:25 AM        Name: Keelyn Fjelstad  MRN: [id number] 12 visits    Check all possible CPT codes: 02889- Therapeutic Exercise, [phone]- Neuro Re-education, [phone] - Gait Training, [phone] - Manual Therapy, 97530 - Therapeutic Activities, 97535 - Self Care, [phone] - Re-evaluation, [id number] - Mechanical traction, and [id number] - Aquatic therapy   Thank you!  MCD - Secure

## 2024-08-22 NOTE — CV Procedure (Signed)
  Device system confirmed to be MRI conditional, with implant date > 6 weeks ago, and no evidence of abandoned or epicardial leads in review of most recent CXR  Device last cleared by EP Provider: Suzann Riddle 08/22/24  Clearance is good through for 1 year as long as parameters remain stable at time of check. If pt undergoes a cardiac device procedure during that time, they should be re-cleared.   Tachy-therapies to be programmed off if applicable with device back to pre-MRI settings after completion of exam.  [employer] will be present for programming for the MRI.   Rocky Catalan, RT  08/22/2024 9:45 AM

## 2024-08-25 ENCOUNTER — Encounter: Payer: Self-pay | Admitting: Radiology

## 2024-08-26 ENCOUNTER — Ambulatory Visit (HOSPITAL_COMMUNITY)
Admission: RE | Admit: 2024-08-26 | Discharge: 2024-08-26 | Disposition: A | Discharge status: Home / Self Care | Source: Ambulatory Visit | Attending: Orthopaedic Surgery | Admitting: Orthopaedic Surgery

## 2024-08-26 DIAGNOSIS — M47817 Spondylosis without myelopathy or radiculopathy, lumbosacral region: Secondary | ICD-10-CM

## 2024-08-26 DIAGNOSIS — M51379 Other intervertebral disc degeneration, lumbosacral region without mention of lumbar back pain or lower extremity pain: Secondary | ICD-10-CM

## 2024-08-26 DIAGNOSIS — M5416 Radiculopathy, lumbar region: Secondary | ICD-10-CM | POA: Diagnosis present

## 2024-08-26 DIAGNOSIS — M48061 Spinal stenosis, lumbar region without neurogenic claudication: Secondary | ICD-10-CM | POA: Diagnosis not present

## 2024-08-26 DIAGNOSIS — M51369 Other intervertebral disc degeneration, lumbar region without mention of lumbar back pain or lower extremity pain: Secondary | ICD-10-CM

## 2024-08-26 DIAGNOSIS — M4156 Other secondary scoliosis, lumbar region: Secondary | ICD-10-CM | POA: Diagnosis not present

## 2024-08-26 DIAGNOSIS — M47816 Spondylosis without myelopathy or radiculopathy, lumbar region: Secondary | ICD-10-CM | POA: Diagnosis not present

## 2024-08-26 DIAGNOSIS — M4807 Spinal stenosis, lumbosacral region: Secondary | ICD-10-CM

## 2024-08-26 NOTE — Progress Notes (Signed)
 Patient was monitored by this RN during MRI scan due to presence of a pacemaker. Cardiac rhythm was continuously monitored throughout the procedure. Prior to the start of the scan, the pacemaker was placed in MRI-safe mode by the MRI technician and/or pacemaker representative. Following the completion of the scan, the device was returned to its pre-MRI settings. Neurological status and orientation post-procedure were unchanged from baseline.   Pre-procedure Heart Rate (Prior to being placed in MRI safe mode): 59 Post-procedure Heart Rate (Once pacemaker is returned to baseline mode): 60

## 2024-09-04 ENCOUNTER — Ambulatory Visit (INDEPENDENT_AMBULATORY_CARE_PROVIDER_SITE_OTHER): Admitting: Orthopedic Surgery

## 2024-09-04 ENCOUNTER — Ambulatory Visit (INDEPENDENT_AMBULATORY_CARE_PROVIDER_SITE_OTHER): Admitting: Orthopaedic Surgery

## 2024-09-04 ENCOUNTER — Other Ambulatory Visit (INDEPENDENT_AMBULATORY_CARE_PROVIDER_SITE_OTHER): Payer: Self-pay

## 2024-09-04 VITALS — BP 123/56 | HR 74 | Ht 68.0 in | Wt 158.0 lb

## 2024-09-04 DIAGNOSIS — M545 Low back pain, unspecified: Secondary | ICD-10-CM

## 2024-09-04 DIAGNOSIS — M5416 Radiculopathy, lumbar region: Secondary | ICD-10-CM | POA: Diagnosis not present

## 2024-09-04 NOTE — Progress Notes (Signed)
 Post Operative Evaluation    Procedure/Date of Surgery: Right hip gluteus maximus tendon transfer 7/15  Interval History:    Presents 12 weeks status post above procedure.  Overall he is doing quite well.  He does continue to have persistent radiating back pain and stenosis type symptoms.  He is here today for additional MRI discussion of the lumbar spine   PMH/PSH/Family History/Social History/Meds/Allergies:    Past Medical History:  Diagnosis Date   Chronic kidney disease    CKD stg 3b   Colonic polyp 2011 last colo   colo q 85yr - [facility]    Coronary artery disease 02/2009   a. 02/2009 Cath: severe left main and three-vessel-->CABG; b. 12/2016 MVL EF 49%, no isch/infarct.   DEGENERATIVE JOINT DISEASE    Diastolic dysfunction    a. 10/2021 Echo: EF 55-60%, no rwma, mod conc LVH, GrI DD, mildly reduced RV fxn, RVSP 26.8 mmHg. mild AI, and aortic sclerosis.   Diverticulosis of colon    DIVERTICULOSIS, COLON    GERD (gastroesophageal reflux disease)    History of kidney stones    HYPERLIPIDEMIA-MIXED    HYPERTENSION, BENIGN    Interstitial lung disease (HCC)    LBP (low back pain)    s/p decompression 8/14; ESI - [place]; RFA x 3 early 2015   Pacemaker    a. 05/2017: St. Jude (serial number [id number]) pacemaker for CHB   Symptomatic bradycardia    a. s/p St. Jude (serial number [id number]) pacemaker 06/16/17   Type II or unspecified type diabetes mellitus without mention of complication, not stated as uncontrolled    VITAMIN D  DEFICIENCY    Past Surgical History:  Procedure Laterality Date   CARPAL TUNNEL RELEASE Right    CATARACT EXTRACTION, BILATERAL     R 07/07/13, L 07/21/13   CORONARY ARTERY BYPASS GRAFT  03/17/09   x 4   CYSTOSCOPY WITH STENT PLACEMENT Left 05/10/2021   Procedure: CYSTOSCOPY WITH LEFT  STENT PLACEMENT,LEFT RETROGRADE PYELOGRAM;  Surgeon: Cam Morene ORN, MD;  Location: [facility];  Service: Urology;  Laterality: Left;    CYSTOSCOPY/URETEROSCOPY/HOLMIUM LASER/STENT PLACEMENT Left 06/03/2021   Procedure: CYSTOSCOPY, LEFT RETROGRADE, URETEROSCOPY,HOLMIUM LASER,STENT EXCHANGE;  Surgeon: Cam Morene ORN, MD;  Location: [facility];  Service: Urology;  Laterality: Left;   GLUTEUS MINIMUS REPAIR Right 05/06/2024   Procedure: REPAIR, TENDON, GLUTEUS MINIMUS;  Surgeon: Genelle Standing, MD;  Location: [facility];  Service: Orthopedics;  Laterality: Right;  RIGHT GLUTEUS MAXIMUS TENDON TRANSFER   INGUINAL HERNIA REPAIR  1980   KNEE ARTHROSCOPY  2007   right   LUMBAR EPIDURAL INJECTION  2013   Has as needed.   LUMBAR LAMINECTOMY/DECOMPRESSION MICRODISCECTOMY N/A 06/19/2013   Procedure: LUMBAR LAMINECTOMY/DECOMPRESSION MICRODISCECTOMY;  Surgeon: Oneil Rodgers Priestly, MD;  Location: [facility] OR;  Service: Orthopedics;  Laterality: N/A;  Lumbar 4-5 decompression   PACEMAKER IMPLANT N/A 06/16/2017   Procedure: Pacemaker Implant;  Surgeon: Waddell Danelle ORN, MD;  Location: [facility] INVASIVE CV LAB;  Service: Cardiovascular;  Laterality: N/A;   TRIGGER FINGER RELEASE Right    Social History   Socioeconomic History   Marital status: Married    Spouse name: Darice   Number of children: 3   Years of education: Not on file   Highest education level: 12th grade  Occupational History   Not  on file  Tobacco Use   Smoking status: Never   Smokeless tobacco: Never   Tobacco comments:    Married, 3 grown children. Retired 04/2002 from [employer] work  Psychologist, Educational Use   Vaping status: Never Used  Substance and Sexual Activity   Alcohol use: Yes    Comment: RARE   Drug use: No   Sexual activity: Not Currently  Other Topics Concern   Not on file  Social History Narrative   Lives with wife and 1 son.   Social Drivers of Corporate Investment Banker Strain: Low Risk  (12/18/2023)   Overall Financial Resource Strain (CARDIA)    Difficulty of Paying Living Expenses: Not hard at all  Food Insecurity: No Food Insecurity  (12/18/2023)   Hunger Vital Sign    Worried About Running Out of Food in the Last Year: Never true    Ran Out of Food in the Last Year: Never true  Transportation Needs: No Transportation Needs (12/18/2023)   PRAPARE - Administrator, Civil Service (Medical): No    Lack of Transportation (Non-Medical): No  Physical Activity: Inactive (12/18/2023)   Exercise Vital Sign    Days of Exercise per Week: 0 days    Minutes of Exercise per Session: 20 min  Stress: No Stress Concern Present (12/18/2023)   [employer] of Occupational Health - Occupational Stress Questionnaire    Feeling of Stress : Not at all  Social Connections: Socially Isolated (12/18/2023)   Social Connection and Isolation Panel    Frequency of Communication with Friends and Family: Once a week    Frequency of Social Gatherings with Friends and Family: Once a week    Attends Religious Services: Never    Database Administrator or Organizations: No    Attends Engineer, Structural: Never    Marital Status: Married   Family History  Problem Relation Age of Onset   Heart failure Mother        died age 42   Heart failure Father        died age 16   Diabetes Other    Heart disease Neg Hx    Allergies  Allergen Reactions   Penicillins Other (See Comments) and Anaphylaxis    Taste buds   Current Outpatient Medications  Medication Sig Dispense Refill   Acetaminophen  (TYLENOL  8 HOUR PO) Take 1,000 mg by mouth 3 (three) times daily.     aspirin  EC 325 MG tablet Take 1 tablet (325 mg total) by mouth daily. 14 tablet 0   atorvastatin  (LIPITOR) 20 MG tablet TAKE 1 TABLET DAILY AT     6:00PM 90 tablet 1   carvedilol  (COREG ) 6.25 MG tablet Take 1 tablet (6.25 mg total) by mouth 2 (two) times daily with a meal. 180 tablet 3   cholecalciferol  (VITAMIN D ) 1000 UNITS tablet Take 1,000 Units by mouth daily.     doxycycline  (VIBRA -TABS) 100 MG tablet Take 1 tablet (100 mg total) by mouth 2 (two) times daily. 20  tablet 0   DULoxetine (CYMBALTA) 30 MG capsule Take 30 mg by mouth every morning.     metFORMIN  (GLUCOPHAGE -XR) 500 MG 24 hr tablet TAKE 1 TABLET TWICE A DAY 180 tablet 3   Multiple Vitamin (MULTIVITAMIN WITH MINERALS) TABS tablet Take 1 tablet by mouth daily.     niacin  (NIASPAN ) 1000 MG CR tablet TAKE 1 TABLET AT BEDTIME 90 tablet 3   TURMERIC CURCUMIN PO Take 2,000 mg by  mouth in the morning and at bedtime.     warfarin (JANTOVEN ) 2 MG tablet TAKE 1 TO 2 TABLETS DAILY AS DIRECTED BY COUMADIN  CLINIC 90 tablet 1   No current facility-administered medications for this visit.   No results found.  Review of Systems:   A ROS was performed including pertinent positives and negatives as documented in the HPI.   Musculoskeletal Exam:    There were no vitals taken for this visit.  Right hip incision is well-appearing without erythema or drainage.  Walks with a mildly antalgic gait.  Distal neurosensory exam is intact 30 degrees internal/external rotation of the right hip without pain  Positive straight leg raise particularly on the left.  Equivocally positive on the right.  There is numbness in the L5 distribution on the left.  Strength is otherwise normal with an antalgic gait  Imaging:      I personally reviewed and interpreted the radiographs.   Assessment:   Presents 12weeks status post right hip gluteus maximus tendon transfer overall doing well.  He is also having some critical stenosis at the L2-L3 level as well as the L5-S1 level.  I did discuss that despite his age he is overall extremely active and has done extremely well with his gluteus tendon transfer.  Given that I do believe he would benefit from referral from Dr. Georgina for which she has a appointment this afternoon  Plan :    - Return to clinic as needed      I personally saw and evaluated the patient, and participated in the management and treatment plan.  Elspeth Parker, MD Attending Physician, Orthopedic  Surgery  This document was dictated using Dragon voice recognition software. A reasonable attempt at proof reading has been made to minimize errors.

## 2024-09-04 NOTE — Progress Notes (Signed)
 Orthopedic Spine Surgery Office Note  Assessment: Patient is a 89 y.o. male with low back pain.  No radicular symptoms.  Multiple levels with degenerative disc and facet arthropathy   Plan: -Patient has tried PT, Tylenol , NSAIDs, steroid injections, Norco -Explained to the patient that spine surgery for low back pain is not predictable giving relief.  He has multiple levels with degenerative disc and facet arthropathy.  He points to an area in his lower lumbar spine near L5/S1 but a fusion type surgery would not predictably give him relief.  I discussed getting a SPECT scan to try to help in diagnosing the area causing pain.  This scan was ordered for him today -Patient should return to office in 4 weeks, x-rays at next visit: none   Patient expressed understanding of the plan and all questions were answered to the patient's satisfaction.   ___________________________________________________________________________   History:  Patient is a 89 y.o. male who presents today for lumbar spine.  Patient has had several years of low back pain.  Pain has gotten progressively worse with time.  He has no radiating leg pain.  He feels the pain is worse with activity and improves with rest.  He notes it is particularly worse with bending.  There was no trauma or injury that preceded the onset of the pain.  He has been getting injections and Norco for a long time with Dr. Ibazebo.  This treatment was generally give him about 3 months of relief but that it stopped working.  No bowel or bladder incontinence.  No saddle anesthesia.  Treatments tried: PT, Tylenol , NSAIDs, steroid injections, Norco  Review of systems: Denies fevers and chills, night sweats, unexplained weight loss, history of cancer, pain that wakes them at night  Past medical history: CKD CAD DM (last A1c was 6.7 on 04/01/2024) Diverticulosis HTN HLD GERD  Allergies: penicillin  Past surgical history:  Gluteus tendon  repair Carpal tunnel release CABG Cataract surgery Hernia repair Microdiscectomy Right knee arthroscopy Trigger finger release  Social history: Denies use of nicotine product (smoking, vaping, patches, smokeless) Alcohol use: denies Denies recreational drug use   Physical Exam:  BMI of 24.0  General: no acute distress, appears stated age Neurologic: alert, answering questions appropriately, following commands Respiratory: unlabored breathing on room air, symmetric chest rise Psychiatric: appropriate affect, normal cadence to speech   MSK (spine):  -Strength exam      Left  Right EHL    5/5  5/5 TA    5/5  5/5 GSC    5/5  5/5 Knee extension  5/5  5/5 Hip flexion   5/5  5/5  -Sensory exam    Sensation intact to light touch in L3-S1 nerve distributions of bilateral lower extremities  Imaging: XRs of the lumbar spine from 09/04/2024 were independently reviewed and interpreted, showing multiple levels with disc height loss.  Vacuum disc phenomenon at L5/S1.  Lumbar degenerative scoliosis.  No fracture or dislocation seen.  No evidence of instability on flexion/extension views.  MRI of the lumbar spine from 08/26/2024 was independently reviewed and interpreted, showing right sided foraminal stenosis at L2/3. Central and bilateral foraminal stenosis at L3/4. Lateral recess stenosis on the left at L5/S1. Left sided foraminal stenosis at L4/5 and L5/S1 - more significant at L5/S1. Right sided foraminal stenosis at L5/S1.    Patient name: Andrew [name] Patient MRN: [id number] Date of visit: 09/04/24

## 2024-09-10 ENCOUNTER — Ambulatory Visit: Payer: Medicare Other

## 2024-09-10 DIAGNOSIS — I442 Atrioventricular block, complete: Secondary | ICD-10-CM | POA: Diagnosis not present

## 2024-09-11 ENCOUNTER — Ambulatory Visit: Payer: Self-pay | Admitting: Internal Medicine

## 2024-09-11 LAB — CUP PACEART REMOTE DEVICE CHECK
Battery Remaining Longevity: 41 mo
Battery Remaining Percentage: 34 %
Battery Voltage: 2.95 V
Brady Statistic AP VP Percent: 42 %
Brady Statistic AP VS Percent: 1 %
Brady Statistic AS VP Percent: 58 %
Brady Statistic AS VS Percent: 1 %
Brady Statistic RA Percent Paced: 41 %
Brady Statistic RV Percent Paced: 99 %
Date Time Interrogation Session: 20251119051224
Implantable Lead Connection Status: 753985
Implantable Lead Connection Status: 753985
Implantable Lead Implant Date: 20180825
Implantable Lead Implant Date: 20180825
Implantable Lead Location: 753859
Implantable Lead Location: 753860
Implantable Pulse Generator Implant Date: 20180825
Lead Channel Impedance Value: 440 Ohm
Lead Channel Impedance Value: 600 Ohm
Lead Channel Pacing Threshold Amplitude: 0.5 V
Lead Channel Pacing Threshold Amplitude: 0.75 V
Lead Channel Pacing Threshold Pulse Width: 0.5 ms
Lead Channel Pacing Threshold Pulse Width: 0.5 ms
Lead Channel Sensing Intrinsic Amplitude: 4.6 mV
Lead Channel Sensing Intrinsic Amplitude: 4.9 mV
Lead Channel Setting Pacing Amplitude: 0.75 V
Lead Channel Setting Pacing Amplitude: 2 V
Lead Channel Setting Pacing Pulse Width: 0.5 ms
Lead Channel Setting Sensing Sensitivity: 4 mV
Pulse Gen Model: 2272
Pulse Gen Serial Number: 8939923

## 2024-09-12 NOTE — Progress Notes (Signed)
 Remote PPM Transmission

## 2024-09-15 ENCOUNTER — Encounter

## 2024-09-22 ENCOUNTER — Encounter
Admission: RE | Admit: 2024-09-22 | Discharge: 2024-09-22 | Disposition: A | Source: Ambulatory Visit | Attending: Orthopedic Surgery

## 2024-09-22 DIAGNOSIS — M545 Low back pain, unspecified: Secondary | ICD-10-CM | POA: Diagnosis present

## 2024-09-22 MED ORDER — TECHNETIUM TC 99M MEDRONATE IV KIT
20.0000 | PACK | Freq: Once | INTRAVENOUS | Status: AC | PRN
  Administered 2024-09-22: 22.37 via INTRAVENOUS

## 2024-09-29 ENCOUNTER — Other Ambulatory Visit: Payer: Self-pay | Admitting: Cardiovascular Disease

## 2024-09-30 ENCOUNTER — Encounter: Admitting: Internal Medicine

## 2024-09-30 NOTE — Telephone Encounter (Signed)
Please contact pt for future appointment. Pt overdue for follow up.

## 2024-10-01 ENCOUNTER — Encounter: Payer: Self-pay | Admitting: Internal Medicine

## 2024-10-01 ENCOUNTER — Ambulatory Visit: Attending: Internal Medicine

## 2024-10-01 DIAGNOSIS — Z5181 Encounter for therapeutic drug level monitoring: Secondary | ICD-10-CM | POA: Diagnosis not present

## 2024-10-01 DIAGNOSIS — I82409 Acute embolism and thrombosis of unspecified deep veins of unspecified lower extremity: Secondary | ICD-10-CM

## 2024-10-01 LAB — POCT INR: INR: 2.4 (ref 2.0–3.0)

## 2024-10-01 NOTE — Patient Instructions (Addendum)
 Blood work was ordered.       Medications changes include :   None     Return in about 6 months (around 04/02/2025) for follow up.    Health Maintenance, Male Adopting a healthy lifestyle and getting preventive care are important in promoting health and wellness. Ask your health care provider about: The right schedule for you to have regular tests and exams. Things you can do on your own to prevent diseases and keep yourself healthy. What should I know about diet, weight, and exercise? Eat a healthy diet  Eat a diet that includes plenty of vegetables, fruits, low-fat dairy products, and lean protein. Do not eat a lot of foods that are high in solid fats, added sugars, or sodium. Maintain a healthy weight Body mass index (BMI) is a measurement that can be used to identify possible weight problems. It estimates body fat based on height and weight. Your health care provider can help determine your BMI and help you achieve or maintain a healthy weight. Get regular exercise Get regular exercise. This is one of the most important things you can do for your health. Most adults should: Exercise for at least 150 minutes each week. The exercise should increase your heart rate and make you sweat (moderate-intensity exercise). Do strengthening exercises at least twice a week. This is in addition to the moderate-intensity exercise. Spend less time sitting. Even light physical activity can be beneficial. Watch cholesterol and blood lipids Have your blood tested for lipids and cholesterol at 88 years of age, then have this test every 5 years. You may need to have your cholesterol levels checked more often if: Your lipid or cholesterol levels are high. You are older than 88 years of age. You are at high risk for heart disease. What should I know about cancer screening? Many types of cancers can be detected early and may often be prevented. Depending on your health history and family  history, you may need to have cancer screening at various ages. This may include screening for: Colorectal cancer. Prostate cancer. Skin cancer. Lung cancer. What should I know about heart disease, diabetes, and high blood pressure? Blood pressure and heart disease High blood pressure causes heart disease and increases the risk of stroke. This is more likely to develop in people who have high blood pressure readings or are overweight. Talk with your health care provider about your target blood pressure readings. Have your blood pressure checked: Every 3-5 years if you are 9-66 years of age. Every year if you are 37 years old or older. If you are between the ages of 58 and 45 and are a current or former smoker, ask your health care provider if you should have a one-time screening for abdominal aortic aneurysm (AAA). Diabetes Have regular diabetes screenings. This checks your fasting blood sugar level. Have the screening done: Once every three years after age 70 if you are at a normal weight and have a low risk for diabetes. More often and at a younger age if you are overweight or have a high risk for diabetes. What should I know about preventing infection? Hepatitis B If you have a higher risk for hepatitis B, you should be screened for this virus. Talk with your health care provider to find out if you are at risk for hepatitis B infection. Hepatitis C Blood testing is recommended for: Everyone born from [birth year] through 1963. Anyone with known risk factors for hepatitis C. Sexually  transmitted infections (STIs) You should be screened each year for STIs, including gonorrhea and chlamydia, if: You are sexually active and are younger than 88 years of age. You are older than 88 years of age and your health care provider tells you that you are at risk for this type of infection. Your sexual activity has changed since you were last screened, and you are at increased risk for chlamydia or  gonorrhea. Ask your health care provider if you are at risk. Ask your health care provider about whether you are at high risk for HIV. Your health care provider may recommend a prescription medicine to help prevent HIV infection. If you choose to take medicine to prevent HIV, you should first get tested for HIV. You should then be tested every 3 months for as long as you are taking the medicine. Follow these instructions at home: Alcohol use Do not drink alcohol if your health care provider tells you not to drink. If you drink alcohol: Limit how much you have to 0-2 drinks a day. Know how much alcohol is in your drink. In the [place] one drink equals one 12 oz bottle of beer (355 mL), one 5 oz glass of wine (148 mL), or one 1 oz glass of hard liquor (44 mL). Lifestyle Do not use any products that contain nicotine or tobacco. These products include cigarettes, chewing tobacco, and vaping devices, such as e-cigarettes. If you need help quitting, ask your health care provider. Do not use street drugs. Do not share needles. Ask your health care provider for help if you need support or information about quitting drugs. General instructions Schedule regular health, dental, and eye exams. Stay current with your vaccines. Tell your health care provider if: You often feel depressed. You have ever been abused or do not feel safe at home. Summary Adopting a healthy lifestyle and getting preventive care are important in promoting health and wellness. Follow your health care provider's instructions about healthy diet, exercising, and getting tested or screened for diseases. Follow your health care provider's instructions on monitoring your cholesterol and blood pressure. This information is not intended to replace advice given to you by your health care provider. Make sure you discuss any questions you have with your health care provider. Document Revised: 02/28/2021 Document Reviewed: 02/28/2021 Elsevier  Patient Education  2024 [employer].

## 2024-10-01 NOTE — Patient Instructions (Signed)
 Take 1 tablet Daily, except 2 tablets every Monday and Friday. Eat greens tonight.   INR in 8 weeks.  [phone]

## 2024-10-01 NOTE — Progress Notes (Signed)
 Subjective:    [name], male    DOB: 1934/07/01, 89 y.o.   MRN: [id number]     HPI Andrew [name] is here for a physical exam and his chronic medical problems.   Back pain - taking pain pill twice a day.  He is following with orthopedics.  He has an appointment with the surgeon to see if there is anything he would recommend.  Saw derm - dr joshua a few days for a rash.  He was placed on a cream that is helping.     Medications and allergies reviewed with patient and updated if appropriate.  Current Outpatient Medications on File Prior to Visit  Medication Sig Dispense Refill   Acetaminophen  (TYLENOL  8 HOUR PO) Take 1,000 mg by mouth 3 (three) times daily.     aspirin  EC 325 MG tablet Take 1 tablet (325 mg total) by mouth daily. 14 tablet 0   atorvastatin  (LIPITOR) 20 MG tablet TAKE 1 TABLET DAILY AT     6:00PM 90 tablet 1   carvedilol  (COREG ) 6.25 MG tablet TAKE 1 TABLET TWICE DAILY  WITH MEALS 60 tablet 0   cholecalciferol  (VITAMIN D ) 1000 UNITS tablet Take 1,000 Units by mouth daily.     DULoxetine (CYMBALTA) 30 MG capsule Take 30 mg by mouth every morning.     metFORMIN  (GLUCOPHAGE -XR) 500 MG 24 hr tablet TAKE 1 TABLET TWICE A DAY 180 tablet 3   Multiple Vitamin (MULTIVITAMIN WITH MINERALS) TABS tablet Take 1 tablet by mouth daily.     niacin  (NIASPAN ) 1000 MG CR tablet TAKE 1 TABLET AT BEDTIME 90 tablet 3   TURMERIC CURCUMIN PO Take 2,000 mg by mouth in the morning and at bedtime.     warfarin (JANTOVEN ) 2 MG tablet TAKE 1 TO 2 TABLETS DAILY AS DIRECTED BY COUMADIN  CLINIC 90 tablet 1   No current facility-administered medications on file prior to visit.    Review of Systems  Constitutional:  Negative for fever.  Eyes:  Negative for visual disturbance.  Respiratory:  Positive for shortness of breath (going up 14 stairs). Negative for cough and wheezing.   Cardiovascular:  Negative for chest pain, palpitations and leg swelling.  Gastrointestinal:  Positive  for constipation (from pain medication). Negative for abdominal pain, blood in stool and diarrhea.       No gerd  Genitourinary:  Negative for difficulty urinating and dysuria.  Musculoskeletal:  Positive for arthralgias (minimal) and back pain.  Skin:  Positive for rash.  Neurological:  Negative for light-headedness and headaches.  Psychiatric/Behavioral:  Negative for dysphoric mood. The patient is not nervous/anxious.        Objective:   Vitals:   10/02/24 0857  BP: 118/64  Pulse: 60  Temp: 97.8 F (36.6 C)  SpO2: 97%   Filed Weights   10/02/24 0857  Weight: 158 lb (71.7 kg)   Body mass index is 24.02 kg/m.  BP Readings from Last 3 Encounters:  10/02/24 118/64  09/04/24 (!) 123/56  07/22/24 130/64    Wt Readings from Last 3 Encounters:  10/02/24 158 lb (71.7 kg)  09/04/24 158 lb (71.7 kg)  07/22/24 158 lb (71.7 kg)      Physical Exam Constitutional: He appears well-developed and well-nourished. No distress.  HENT:  Head: Normocephalic and atraumatic.  Right Ear: External ear normal.  Left Ear: External ear normal.  Normal ear canals and TM b/l  Mouth/Throat: Oropharynx is clear and moist. Eyes: Conjunctivae and EOM  are normal.  Neck: Neck supple. No tracheal deviation present. No thyromegaly present.  No carotid bruit  Cardiovascular: Normal rate, regular rhythm, normal heart sounds and intact distal pulses.   No murmur heard.  No lower extremity edema. Pulmonary/Chest: Effort normal and breath sounds normal. No respiratory distress. He has no wheezes. He has no rales.  Abdominal: Soft. He exhibits no distension. There is no tenderness.  Genitourinary: deferred  Lymphadenopathy:   He has no cervical adenopathy.  Skin: Skin is warm and dry. He is not diaphoretic.  Psychiatric: He has a normal mood and affect. His behavior is normal.         Assessment & Plan:   Physical exam: Screening blood work  ordered Exercise   somewhat active-no formal  exercise - limited by back pain Weight is normal Substance abuse   none   Reviewed recommended immunizations.   Health Maintenance  Topic Date Due   FOOT EXAM  07/06/2019   Medicare Annual Wellness (AWV)  09/30/2024   HEMOGLOBIN A1C  10/01/2024   COVID-19 Vaccine (7 - 2025-26 season) 10/18/2024 (Originally 06/23/2024)   Zoster Vaccines- Shingrix (1 of 2) 12/31/2024 (Originally 06/14/1986)   DTaP/Tdap/Td (3 - Tdap) 10/02/2025 (Originally 03/27/2023)   OPHTHALMOLOGY EXAM  11/29/2024   Pneumococcal Vaccine: 50+ Years  Completed   Influenza Vaccine  Completed   Meningococcal B Vaccine  Aged Out   Colonoscopy  Discontinued     See Problem List for Assessment and Plan of chronic medical problems.

## 2024-10-02 ENCOUNTER — Telehealth: Payer: Self-pay

## 2024-10-02 ENCOUNTER — Ambulatory Visit: Admitting: Internal Medicine

## 2024-10-02 ENCOUNTER — Encounter: Payer: Self-pay | Admitting: Physician Assistant

## 2024-10-02 ENCOUNTER — Ambulatory Visit (INDEPENDENT_AMBULATORY_CARE_PROVIDER_SITE_OTHER): Payer: Medicare Other

## 2024-10-02 ENCOUNTER — Ambulatory Visit: Attending: Physician Assistant | Admitting: Physician Assistant

## 2024-10-02 VITALS — Ht 67.5 in | Wt 150.0 lb

## 2024-10-02 VITALS — BP 130/62 | HR 60 | Ht 68.0 in | Wt 158.2 lb

## 2024-10-02 VITALS — BP 118/64 | HR 60 | Temp 97.8°F | Ht 68.0 in | Wt 158.0 lb

## 2024-10-02 DIAGNOSIS — I10 Essential (primary) hypertension: Secondary | ICD-10-CM | POA: Diagnosis not present

## 2024-10-02 DIAGNOSIS — I25708 Atherosclerosis of coronary artery bypass graft(s), unspecified, with other forms of angina pectoris: Secondary | ICD-10-CM

## 2024-10-02 DIAGNOSIS — Z0000 Encounter for general adult medical examination without abnormal findings: Secondary | ICD-10-CM

## 2024-10-02 DIAGNOSIS — E1122 Type 2 diabetes mellitus with diabetic chronic kidney disease: Secondary | ICD-10-CM

## 2024-10-02 DIAGNOSIS — E1169 Type 2 diabetes mellitus with other specified complication: Secondary | ICD-10-CM

## 2024-10-02 DIAGNOSIS — Z86718 Personal history of other venous thrombosis and embolism: Secondary | ICD-10-CM

## 2024-10-02 DIAGNOSIS — E785 Hyperlipidemia, unspecified: Secondary | ICD-10-CM | POA: Diagnosis not present

## 2024-10-02 DIAGNOSIS — J8410 Pulmonary fibrosis, unspecified: Secondary | ICD-10-CM

## 2024-10-02 DIAGNOSIS — I82503 Chronic embolism and thrombosis of unspecified deep veins of lower extremity, bilateral: Secondary | ICD-10-CM | POA: Diagnosis not present

## 2024-10-02 DIAGNOSIS — I2510 Atherosclerotic heart disease of native coronary artery without angina pectoris: Secondary | ICD-10-CM | POA: Diagnosis not present

## 2024-10-02 DIAGNOSIS — E559 Vitamin D deficiency, unspecified: Secondary | ICD-10-CM | POA: Diagnosis not present

## 2024-10-02 DIAGNOSIS — N1832 Chronic kidney disease, stage 3b: Secondary | ICD-10-CM | POA: Diagnosis not present

## 2024-10-02 DIAGNOSIS — E782 Mixed hyperlipidemia: Secondary | ICD-10-CM

## 2024-10-02 DIAGNOSIS — E0822 Diabetes mellitus due to underlying condition with diabetic chronic kidney disease: Secondary | ICD-10-CM

## 2024-10-02 DIAGNOSIS — I442 Atrioventricular block, complete: Secondary | ICD-10-CM | POA: Diagnosis not present

## 2024-10-02 DIAGNOSIS — Z95811 Presence of heart assist device: Secondary | ICD-10-CM | POA: Insufficient documentation

## 2024-10-02 LAB — CBC
HCT: 31.3 % — ABNORMAL LOW (ref 39.0–52.0)
Hemoglobin: 10.5 g/dL — ABNORMAL LOW (ref 13.0–17.0)
MCHC: 33.5 g/dL (ref 30.0–36.0)
MCV: 103.3 fl — ABNORMAL HIGH (ref 78.0–100.0)
Platelets: 169 K/uL (ref 150.0–400.0)
RBC: 3.03 Mil/uL — ABNORMAL LOW (ref 4.22–5.81)
RDW: 15.5 % (ref 11.5–15.5)
WBC: 6.7 K/uL (ref 4.0–10.5)

## 2024-10-02 LAB — COMPREHENSIVE METABOLIC PANEL WITH GFR
ALT: 21 U/L (ref 0–53)
AST: 21 U/L (ref 0–37)
Albumin: 4 g/dL (ref 3.5–5.2)
Alkaline Phosphatase: 41 U/L (ref 39–117)
BUN: 35 mg/dL — ABNORMAL HIGH (ref 6–23)
CO2: 27 meq/L (ref 19–32)
Calcium: 9 mg/dL (ref 8.4–10.5)
Chloride: 107 meq/L (ref 96–112)
Creatinine, Ser: 1.48 mg/dL (ref 0.40–1.50)
GFR: 42.04 mL/min — ABNORMAL LOW (ref >60.00)
Glucose, Bld: 171 mg/dL — ABNORMAL HIGH (ref 70–99)
Potassium: 4 meq/L (ref 3.5–5.1)
Sodium: 141 meq/L (ref 135–145)
Total Bilirubin: 0.4 mg/dL (ref 0.2–1.2)
Total Protein: 6.3 g/dL (ref 6.0–8.3)

## 2024-10-02 LAB — LIPID PANEL
Cholesterol: 119 mg/dL (ref 0–200)
HDL: 60.2 mg/dL (ref >39.00)
LDL Cholesterol: 42 mg/dL (ref 0–99)
NonHDL: 58.33
Total CHOL/HDL Ratio: 2
Triglycerides: 82 mg/dL (ref 0.0–149.0)
VLDL: 16.4 mg/dL (ref 0.0–40.0)

## 2024-10-02 LAB — TSH: TSH: 1.32 u[IU]/mL (ref 0.35–5.50)

## 2024-10-02 LAB — VITAMIN D 25 HYDROXY (VIT D DEFICIENCY, FRACTURES): VITD: 38.47 ng/mL (ref 30.00–100.00)

## 2024-10-02 LAB — HEMOGLOBIN A1C: Hgb A1c MFr Bld: 6.3 % (ref 4.6–6.5)

## 2024-10-02 NOTE — Patient Instructions (Signed)
 Medication Instructions:  Your physician recommends that you continue on your current medications as directed. Please refer to the Current Medication list given to you today.  *If you need a refill on your cardiac medications before your next appointment, please call your pharmacy*  Lab Work: No labs ordered today  If you have labs (blood work) drawn today and your tests are completely normal, you will receive your results only by: MyChart Message (if you have MyChart) OR A paper copy in the mail If you have any lab test that is abnormal or we need to change your treatment, we will call you to review the results.  Testing/Procedures: No test ordered today   Follow-Up: At [facility], you and your health needs are our priority.  As part of our continuing mission to provide you with exceptional heart care, our providers are all part of one team.  This team includes your primary Cardiologist (physician) and Advanced Practice Providers or APPs (Physician Assistants and Nurse Practitioners) who all work together to provide you with the care you need, when you need it.  Your next appointment:   1 year(s)  Provider:   You may see Timothy Gollan, MD or one of the following Advanced Practice Providers on your designated Care Team:   Lonni Meager, NP Lesley Maffucci, PA-C Bernardino Bring, PA-C Cadence Pringle, PA-C Tylene Lunch, NP Barnie Hila, NP

## 2024-10-02 NOTE — Assessment & Plan Note (Signed)
 Chronic Associated with chronic kidney disease stage 3b  Lab Results  Component Value Date   HGBA1C 6.7 (H) 04/01/2024   Sugars controlled A1c Continue metformin  XR 500 mg twice daily

## 2024-10-02 NOTE — Assessment & Plan Note (Signed)
 Chronic DVT Management per cardiology On long-term anticoagulation with warfarin CBC today

## 2024-10-02 NOTE — Assessment & Plan Note (Signed)
 Chronic Stage 3b Stable CBC, CMP, vitamin D  level

## 2024-10-02 NOTE — Assessment & Plan Note (Signed)
 Pacemaker in place Following with cardiology

## 2024-10-02 NOTE — Progress Notes (Signed)
 Cardiology Office Note    Date:  10/02/2024   ID:  Andrew, [name] [birth year], MRN [id number]  PCP:  Andrew Glade PARAS, MD  Cardiologist:  Andrew Lunger, MD  Electrophysiologist:  Andrew Birmingham, MD   Chief Complaint: Follow up  History of Present Illness:   Andrew [name] is a 89 y.o. male with history of symptomatic bradycardia s/p PPM 2018, hyperlipidemia, CAD s/p CABG 2010, recurrent DVT, interstitial lung disease, type 2 diabetes, and CKD 3 who presents for follow up on CAD.    Most recent stress testing was in 2018 and was low risk.  He underwent pacemaker placement at that time in the setting of symptomatic bradycardia.  Echo 10/2021 with EF 55 to 60%, no RWMA, moderate concentric LVH, G1 DD, mildly reduced RV function, RVSP 26.8 mmHg, mild AI, and aortic sclerosis.  He has been on chronic warfarin therapy in the setting of lower extremity DVT on the left.  He was hospitalized in the summer 2024 due to progressive right hip pain.  He was found to have an 11 cm right gluteal intermuscular hematoma.  Warfarin was held.  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  INR was 3.5 on admission.  Outpatient lower extremity ultrasound to reevaluate for chronic DVT was recommended and revealed findings consistent with chronic superficial vein thrombosis bilaterally.  Warfarin was subsequently resumed.   Patient was most recently seen by Dr. Gollan 06/2023 and reported feeling well without any significant change in breathing. No further testing or medication changes were indicated at that time.   Patient presents today overall doing well from a cardiac perspective. He has been very busy recently taking care of his wife who recently broke her leg and is wheelchair bound. They have 2 sets of stairs in their house, 14 steps each, which he uses frequently.  He reports some  dyspnea and pain in his back when using the stairs.  His activity is mostly limited by chronic back pain.  He has left lower extremity swelling due to his chronic DVT which is unchanged from baseline.  He denies exertional angina, palpitations, lightheadedness, and dizziness.  He has some concern surrounding his INR goal which was previously reduced due to traumatic hematoma.  Labs independently reviewed: 10/01/2020-INR 2.4 03/2024-Hgb 11.8, HCT 35.3, platelets 193, sodium 140, potassium 4.4, BUN 27, creatinine 1.67, normal LFTs, A1c 6.7, TC 137, TG 133, HDL 49, LDL 61  Objective   Past Medical History:  Diagnosis Date   Chronic kidney disease    CKD stg 3b   Colonic polyp 2011 last colo   colo q 61yr - [facility]    Coronary artery disease 02/2009   a. 02/2009 Cath: severe left main and three-vessel-->CABG; b. 12/2016 MVL EF 49%, no isch/infarct.   DEGENERATIVE JOINT DISEASE    Diastolic dysfunction    a. 10/2021 Echo: EF 55-60%, no rwma, mod conc LVH, GrI DD, mildly reduced RV fxn, RVSP 26.8 mmHg. mild AI, and aortic sclerosis.   Diverticulosis of colon    DIVERTICULOSIS, COLON    GERD (gastroesophageal reflux disease)    History of kidney stones    HYPERLIPIDEMIA-MIXED    HYPERTENSION, BENIGN    Interstitial lung disease (HCC)    LBP (low back pain)    s/p decompression 8/14; ESI - [place]; RFA x 3 early 2015   Pacemaker  a. 05/2017: St. Jude (serial number [id number]) pacemaker for [facility]   Symptomatic bradycardia    a. s/p St. Jude (serial number [id number]) pacemaker 06/16/17   Type II or unspecified type diabetes mellitus without mention of complication, not stated as uncontrolled    VITAMIN D  DEFICIENCY     Current Medications: Active Medications[1]  Allergies:   Penicillins   Social History   Socioeconomic History   Marital status: Married    Spouse name: Darice   Number of children: 3   Years of education: Not on file   Highest education level: 12th grade  Occupational History    Not on file  Tobacco Use   Smoking status: Never   Smokeless tobacco: Never   Tobacco comments:    Married, 3 grown children. Retired 04/2002 from [employer] work  Psychologist, Educational Use   Vaping status: Never Used  Substance and Sexual Activity   Alcohol use: Yes    Comment: RARE   Drug use: No   Sexual activity: Not Currently  Other Topics Concern   Not on file  Social History Narrative   Lives with wife and 1 son.   Social Drivers of Health   Tobacco Use: Low Risk (10/02/2024)   Patient History    Smoking Tobacco Use: Never    Smokeless Tobacco Use: Never    Passive Exposure: Not on file  Financial Resource Strain: Low Risk (09/29/2024)   Overall Financial Resource Strain (CARDIA)    Difficulty of Paying Living Expenses: Not hard at all  Food Insecurity: No Food Insecurity (09/29/2024)   Epic    Worried About Programme Researcher, Broadcasting/film/video in the Last Year: Never true    Ran Out of Food in the Last Year: Never true  Transportation Needs: No Transportation Needs (09/29/2024)   Epic    Lack of Transportation (Medical): No    Lack of Transportation (Non-Medical): No  Physical Activity: Inactive (09/29/2024)   Exercise Vital Sign    Days of Exercise per Week: 0 days    Minutes of Exercise per Session: Not on file  Stress: No Stress Concern Present (09/29/2024)   [employer] of Occupational Health - Occupational Stress Questionnaire    Feeling of Stress: Not at all  Social Connections: Socially Isolated (09/29/2024)   Social Connection and Isolation Panel    Frequency of Communication with Friends and Family: Once a week    Frequency of Social Gatherings with Friends and Family: Once a week    Attends Religious Services: Never    Database Administrator or Organizations: No    Attends Engineer, Structural: Not on file    Marital Status: Married  Depression (PHQ2-9): Low Risk (04/01/2024)   Depression (PHQ2-9)    PHQ-2 Score: 0  Alcohol Screen: Low Risk (10/01/2023)    Alcohol Screen    Last Alcohol Screening Score (AUDIT): 0  Housing: Unknown (09/29/2024)   Epic    Unable to Pay for Housing in the Last Year: Not on file    Number of Times Moved in the Last Year: 0    Homeless in the Last Year: No  Utilities: Not At Risk (10/01/2023)   AHC Utilities    Threatened with loss of utilities: No  Health Literacy: Adequate Health Literacy (10/01/2023)   B1300 Health Literacy    Frequency of need for help with medical instructions: Never     Family History:  The patient's family history includes Diabetes in an other family member; Heart  failure in his father and mother. There is no history of Heart disease.  ROS:   12-point review of systems is negative unless otherwise noted in the HPI.  EKGs/Other Studies Reviewed:    Studies reviewed were summarized above. The additional studies were reviewed today:  10/2021 Echo complete 1. Left ventricular ejection fraction, by estimation, is 55 to 60%. The left ventricle has normal function. The left ventricle has no regional wall motion abnormalities. There is moderate concentric left ventricular  hypertrophy. Left ventricular diastolic parameters are consistent with Grade I diastolic dysfunction (impaired relaxation).   2. Right ventricular systolic function is mildly reduced. The right ventricular size is normal. There is normal pulmonary artery systolic pressure. The estimated right ventricular systolic pressure is 26.8 mmHg.   3. The mitral valve is grossly normal. No evidence of mitral valve regurgitation. No evidence of mitral stenosis.   4. The aortic valve is tricuspid. There is moderate calcification of the aortic valve. There is moderate thickening of the aortic valve. Aortic valve regurgitation is mild. Aortic valve sclerosis/calcification is present, without any evidence of aortic stenosis.   5. The inferior vena cava is normal in size with greater than 50% respiratory variability, suggesting right atrial  pressure of 3 mmHg.   EKG:  EKG personally reviewed by me today EKG Interpretation Date/Time:  Thursday October 02 2024 10:48:19 EST Ventricular Rate:  60 PR Interval:  186 QRS Duration:  196 QT Interval:  466 QTC Calculation: 466 R Axis:   -65  Text Interpretation: AV dual-paced rhythm When compared with ECG of 05-May-2024 08:30, No significant change was found Confirmed by Lorene Sinclair ([id number]) on 10/02/2024 10:51:07 AM  PHYSICAL EXAM:    VS:  BP 130/62 (BP Location: Left Arm, Patient Position: Sitting, Cuff Size: Normal)   Pulse 60   Ht 5' 8 (1.727 m)   Wt 158 lb 3.2 oz (71.8 kg)   SpO2 97%   BMI 24.05 kg/m   BMI: Body mass index is 24.05 kg/m.  GEN: Well nourished, well developed in no acute distress NECK: No JVD; No carotid bruits CARDIAC: RRR, no murmurs, rubs, gallops RESPIRATORY:  Clear to auscultation without rales, wheezing or rhonchi  ABDOMEN: Soft, non-tender, non-distended EXTREMITIES: No edema; No deformity  Wt Readings from Last 3 Encounters:  10/02/24 158 lb 3.2 oz (71.8 kg)  10/02/24 158 lb (71.7 kg)  09/04/24 158 lb (71.7 kg)                  ASSESSMENT & PLAN:   Coronary artery disease - No symptoms concerning for angina.  No indication for ischemic evaluation at this time.  He is continued on aspirin , atorvastatin , and carvedilol .  Hypertension - Blood pressure is well-controlled on carvedilol  6.25 mg twice daily.  Hyperlipidemia - Most recent lipid panel 03/2024 with LDL 61, at goal.  Continue atorvastatin .  Symptomatic bradycardia s/p PPM - Device functioning well on most recent check. Follows with EP.   Chronic DVT - Most recent dopplers 03/2023 showed chronic DVT. He is continued on warfarin. INR goal previously reduced due to traumatic hematoma requiring hospitalization 02/2023. No further issues with bleeding or bruising since. Discussed goal INR with primary cardiologist who agrees with therapeutic range of 2-3. Follows with Coumadin   clinic.    Disposition: INR goal 2-3. F/u with Dr. Gollan or an APP in 1 year.   Medication Adjustments/Labs and Tests Ordered: Current medicines are reviewed at length with the patient today.  Concerns regarding medicines are  outlined above. Medication changes, Labs and Tests ordered today are summarized above and listed in the Patient Instructions accessible in Encounters.   Bonney Lesley Maffucci, PA-C 10/02/2024 1:17 PM     [facility] [place] [address] [place], [place] [address] [phone]  \     [1]  Current Meds  Medication Sig   Acetaminophen  (TYLENOL  8 HOUR PO) Take 1,000 mg by mouth 3 (three) times daily.   aspirin  EC 325 MG tablet Take 1 tablet (325 mg total) by mouth daily.   atorvastatin  (LIPITOR) 20 MG tablet TAKE 1 TABLET DAILY AT     6:00PM   carvedilol  (COREG ) 6.25 MG tablet TAKE 1 TABLET TWICE DAILY  WITH MEALS   cholecalciferol  (VITAMIN D ) 1000 UNITS tablet Take 1,000 Units by mouth daily.   DULoxetine (CYMBALTA) 30 MG capsule Take 30 mg by mouth every morning.   metFORMIN  (GLUCOPHAGE -XR) 500 MG 24 hr tablet TAKE 1 TABLET TWICE A DAY   Multiple Vitamin (MULTIVITAMIN WITH MINERALS) TABS tablet Take 1 tablet by mouth daily.   niacin  (NIASPAN ) 1000 MG CR tablet TAKE 1 TABLET AT BEDTIME   TURMERIC CURCUMIN PO Take 2,000 mg by mouth in the morning and at bedtime.   warfarin (JANTOVEN ) 2 MG tablet TAKE 1 TO 2 TABLETS DAILY AS DIRECTED BY COUMADIN  CLINIC

## 2024-10-02 NOTE — Patient Instructions (Addendum)
 [name],  Thank you for taking the time for your Medicare Wellness Visit. I appreciate your continued commitment to your health goals. Please review the care plan we discussed, and feel free to reach out if I can assist you further.  Please note that Annual Wellness Visits do not include a physical exam. Some assessments may be limited, especially if the visit was conducted virtually. If needed, we may recommend an in-person follow-up with your provider.  Ongoing Care Seeing your primary care provider every 3 to 6 months helps us  monitor your health and provide consistent, personalized care.   Referrals If a referral was made during today's visit and you haven't received any updates within two weeks, please contact the referred provider directly to check on the status.  Recommended Screenings:  Health Maintenance  Topic Date Due   Complete foot exam   07/06/2019   COVID-19 Vaccine (7 - 2025-26 season) 10/18/2024*   Zoster (Shingles) Vaccine (1 of 2) 12/31/2024*   DTaP/Tdap/Td vaccine (3 - Tdap) 10/02/2025*   Eye exam for diabetics  11/29/2024   Hemoglobin A1C  04/02/2025   Medicare Annual Wellness Visit  10/02/2025   Pneumococcal Vaccine for age over 52  Completed   Flu Shot  Completed   Meningitis B Vaccine  Aged Out   Colon Cancer Screening  Discontinued  *Topic was postponed. The date shown is not the original due date.       10/02/2024    1:51 PM  Advanced Directives  Does Patient Have a Medical Advance Directive? Yes  Type of Andrew [name];Living will  Does patient want to make changes to medical advance directive? Yes (Inpatient - patient requests chaplain consult to change a medical advance directive)  Copy of Healthcare Power of Attorney in Chart? Yes - validated most recent copy scanned in chart (See row information)    Vision: Annual vision screenings are recommended for early detection of glaucoma, cataracts, and diabetic  retinopathy. These exams can also reveal signs of chronic conditions such as diabetes and high blood pressure.  Dental: Annual dental screenings help detect early signs of oral cancer, gum disease, and other conditions linked to overall health, including heart disease and diabetes.  Managing Pain Without Opioids Opioids are strong medicines used to treat moderate to severe pain. For some people, especially those who have long-term (chronic) pain, opioids may not be the best choice for pain management due to: Side effects like nausea, constipation, and sleepiness. The risk of addiction (opioid use disorder). The longer you take opioids, the greater your risk of addiction. Pain that lasts for more than 3 months is called chronic pain. Managing chronic pain usually requires more than one approach and is often provided by a team of health care providers working together (multidisciplinary approach). Pain management may be done at a pain management center or pain clinic. How to manage pain without the use of opioids Use non-opioid medicines Non-opioid medicines for pain may include: Over-the-counter or prescription non-steroidal anti-inflammatory drugs (NSAIDs). These may be the first medicines used for pain. They work well for muscle and bone pain, and they reduce swelling. Acetaminophen . This over-the-counter medicine may work well for milder pain but not swelling. Antidepressants. These may be used to treat chronic pain. A certain type of antidepressant (tricyclics) is often used. These medicines are given in lower doses for pain than when used for depression. Anticonvulsants. These are usually used to treat seizures but may also reduce nerve (neuropathic) pain.  Muscle relaxants. These relieve pain caused by sudden muscle tightening (spasms). You may also use a pain medicine that is applied to the skin as a patch, cream, or gel (topical analgesic), such as a numbing medicine. These may cause fewer  side effects than medicines taken by mouth. Do certain therapies as directed Some therapies can help with pain management. They include: Physical therapy. You will do exercises to gain strength and flexibility. A physical therapist may teach you exercises to move and stretch parts of your body that are weak, stiff, or painful. You can learn these exercises at physical therapy visits and practice them at home. Physical therapy may also involve: Massage. Heat wraps or applying heat or cold to affected areas. Electrical signals that interrupt pain signals (transcutaneous electrical nerve stimulation, TENS). Weak lasers that reduce pain and swelling (low-level laser therapy). Signals from your body that help you learn to regulate pain (biofeedback). Occupational therapy. This helps you to learn ways to function at home and work with less pain. Recreational therapy. This involves trying new activities or hobbies, such as a physical activity or drawing. Mental health therapy, including: Cognitive behavioral therapy (CBT). This helps you learn coping skills for dealing with pain. Acceptance and commitment therapy (ACT) to change the way you think and react to pain. Relaxation therapies, including muscle relaxation exercises and mindfulness-based stress reduction. Pain management counseling. This may be individual, family, or group counseling.  Receive medical treatments Medical treatments for pain management include: Nerve block injections. These may include a pain blocker and anti-inflammatory medicines. You may have injections: Near the spine to relieve chronic back or neck pain. Into joints to relieve back or joint pain. Into nerve areas that supply a painful area to relieve body pain. Into muscles (trigger point injections) to relieve some painful muscle conditions. A medical device placed near your spine to help block pain signals and relieve nerve pain or chronic back pain (spinal cord  stimulation device). Acupuncture. Follow these instructions at home Medicines Take over-the-counter and prescription medicines only as told by your health care provider. If you are taking pain medicine, ask your health care providers about possible side effects to watch out for. Do not drive or use heavy machinery while taking prescription opioid pain medicine. Lifestyle  Do not use drugs or alcohol to reduce pain. If you drink alcohol, limit how much you have to: 0-1 drink a day for women who are not pregnant. 0-2 drinks a day for men. Know how much alcohol is in a drink. In the [place] one drink equals one 12 oz bottle of beer (355 mL), one 5 oz glass of wine (148 mL), or one 1 oz glass of hard liquor (44 mL). Do not use any products that contain nicotine or tobacco. These products include cigarettes, chewing tobacco, and vaping devices, such as e-cigarettes. If you need help quitting, ask your health care provider. Eat a healthy diet and maintain a healthy weight. Poor diet and excess weight may make pain worse. Eat foods that are high in fiber. These include fresh fruits and vegetables, whole grains, and beans. Limit foods that are high in fat and processed sugars, such as fried and sweet foods. Exercise regularly. Exercise lowers stress and may help relieve pain. Ask your health care provider what activities and exercises are safe for you. If your health care provider approves, join an exercise class that combines movement and stress reduction. Examples include yoga and tai chi. Get enough sleep. Lack of sleep  may make pain worse. Lower stress as much as possible. Practice stress reduction techniques as told by your therapist. General instructions Work with all your pain management providers to find the treatments that work best for you. You are an important member of your pain management team. There are many things you can do to reduce pain on your own. Consider joining an online or  in-person support group for people who have chronic pain. Keep all follow-up visits. This is important. Where to find more information You can find more information about managing pain without opioids from: American Academy of Pain Medicine: painmed.org Institute for Chronic Pain: instituteforchronicpain.org American Chronic Pain Association: theacpa.org Contact a health care provider if: You have side effects from pain medicine. Your pain gets worse or does not get better with treatments or home therapy. You are struggling with anxiety or depression. Summary Many types of pain can be managed without opioids. Chronic pain may respond better to pain management without opioids. Pain is best managed when you and a team of health care providers work together. Pain management without opioids may include non-opioid medicines, medical treatments, physical therapy, mental health therapy, and lifestyle changes. Tell your health care providers if your pain gets worse or is not being managed well enough. This information is not intended to replace advice given to you by your health care provider. Make sure you discuss any questions you have with your health care provider. Document Revised: 01/19/2021 Document Reviewed: 01/19/2021 Elsevier Patient Education  2024 [employer].

## 2024-10-02 NOTE — Assessment & Plan Note (Signed)
Chronic Following with pulmonary Currently not on any treatment secondary to side effects

## 2024-10-02 NOTE — Assessment & Plan Note (Signed)
 Chronic Following with cardiology No symptoms consistent with angina Continue aspirin  81 mg daily, atorvastatin  20 mg daily, Coreg  6.25 mg daily, Niaspan  1000 mg at bedtime, warfarin

## 2024-10-02 NOTE — Telephone Encounter (Signed)
 I spoke to patient in regards to increasing INR goal to 2-3.  I informed him to increase to 1 tablet daily, except 2 tablets every Monday, Wednesday and Friday.  He verbalized understanding.

## 2024-10-02 NOTE — Assessment & Plan Note (Signed)
 Chronic Taking vitamin D daily Check vitamin D level

## 2024-10-02 NOTE — Progress Notes (Signed)
 Chief Complaint  Patient presents with   Medicare Wellness     Subjective:  Please attest and cosign this visit due to patients primary care provider not being in the office at the time the visit was completed.  (Pt of Dr Glade Hope)   Andrew [name] is a 89 y.o. male who presents for a Medicare Annual Wellness Visit.  Visit info / Clinical Intake: Medicare Wellness Visit Type:: Subsequent Annual Wellness Visit Persons participating in visit and providing information:: patient Medicare Wellness Visit Mode:: Telephone If telephone:: video declined Since this visit was completed virtually, some vitals may be partially provided or unavailable. Missing vitals are due to the limitations of the virtual format.: Documented vitals are patient reported If Telephone or Video please confirm:: I connected with patient using audio/video enable telemedicine. I verified patient identity with two identifiers, discussed telehealth limitations, and patient agreed to proceed. Patient Location:: Home Provider Location:: Office Interpreter Needed?: No Pre-visit prep was completed: yes AWV questionnaire completed by patient prior to visit?: yes Date:: 09/29/24 Living arrangements:: lives with spouse/significant other Patient's Overall Health Status Rating: (!) fair Typical amount of pain: (!) a lot Does pain affect daily life?: (!) yes Are you currently prescribed opioids?: (!) yes  Dietary Habits and Nutritional Risks How many meals a day?: 3 Eats fruit and vegetables daily?: (!) no Most meals are obtained by: preparing own meals In the last 2 weeks, have you had any of the following?: none Diabetic:: (!) yes Any non-healing wounds?: no How often do you check your BS?: 0 Would you like to be referred to a Nutritionist or for Diabetic Management? : no  Functional Status Activities of Daily Living (to include ambulation/medication): Independent Ambulation: Independent with device- listed  below Home Assistive Devices/Equipment: Eyeglasses; Cane; Dentures (specify type) Medication Administration: Independent Home Management (perform basic housework or laundry): Independent Manage your own finances?: yes Primary transportation is: driving Concerns about vision?: no *vision screening is required for WTM* Concerns about hearing?: (!) yes Uses hearing aids?: (!) yes Hear whispered voice?: yes  Fall Screening Falls in the past year?: 0 Number of falls in past year: 0 Was there an injury with Fall?: 0 Fall Risk Category Calculator: 0 Patient Fall Risk Level: Low Fall Risk  Fall Risk Patient at Risk for Falls Due to: No Fall Risks Fall risk Follow up: Falls evaluation completed; Falls prevention discussed  Home and Transportation Safety: All rugs have non-skid backing?: yes All stairs or steps have railings?: yes Grab bars in the bathtub or shower?: yes Have non-skid surface in bathtub or shower?: yes Good home lighting?: yes Regular seat belt use?: yes Hospital stays in the last year:: no  Cognitive Assessment Difficulty concentrating, remembering, or making decisions? : no Will 6CIT or Mini Cog be Completed: yes What year is it?: 0 points What month is it?: 0 points Give patient an address phrase to remember (5 components): [address]  [place], [place] About what time is it?: 0 points Count backwards from 20 to 1: 0 points Say the months of the year in reverse: 0 points Repeat the address phrase from earlier: 2 points (Drive) 6 CIT Score: 2 points  Advance Directives (For Healthcare) Does Patient Have a Medical Advance Directive?: Yes Does patient want to make changes to medical advance directive?: Yes (Inpatient - patient requests chaplain consult to change a medical advance directive) Type of Advance Directive: Healthcare Power of [place]; Living will Copy of Healthcare Power of Attorney in  Chart?: Yes - validated most recent copy scanned in chart (See row  information) Copy of Living Will in Chart?: Yes - validated most recent copy scanned in chart (See row information)  Reviewed/Updated  Reviewed/Updated: Reviewed All (Medical, Surgical, Family, Medications, Allergies, Care Teams, Patient Goals)    Allergies (verified) Penicillins   Current Medications (verified) Outpatient Encounter Medications as of 10/02/2024  Medication Sig   Acetaminophen  (TYLENOL  8 HOUR PO) Take 1,000 mg by mouth 3 (three) times daily.   aspirin  EC 325 MG tablet Take 1 tablet (325 mg total) by mouth daily.   atorvastatin  (LIPITOR) 20 MG tablet TAKE 1 TABLET DAILY AT     6:00PM   carvedilol  (COREG ) 6.25 MG tablet TAKE 1 TABLET TWICE DAILY  WITH MEALS   cholecalciferol  (VITAMIN D ) 1000 UNITS tablet Take 1,000 Units by mouth daily.   DULoxetine (CYMBALTA) 30 MG capsule Take 30 mg by mouth every morning.   HYDROcodone -acetaminophen  (NORCO/VICODIN) 5-325 MG tablet Take 1 tablet by mouth every 6 (six) hours as needed for moderate pain (pain score 4-6) Counsellor).   metFORMIN  (GLUCOPHAGE -XR) 500 MG 24 hr tablet TAKE 1 TABLET TWICE A DAY   Multiple Vitamin (MULTIVITAMIN WITH MINERALS) TABS tablet Take 1 tablet by mouth daily.   niacin  (NIASPAN ) 1000 MG CR tablet TAKE 1 TABLET AT BEDTIME   TURMERIC CURCUMIN PO Take 2,000 mg by mouth in the morning and at bedtime.   warfarin (JANTOVEN ) 2 MG tablet TAKE 1 TO 2 TABLETS DAILY AS DIRECTED BY COUMADIN  CLINIC   No facility-administered encounter medications on file as of 10/02/2024.    History: Past Medical History:  Diagnosis Date   Chronic kidney disease    CKD stg 3b   Colonic polyp 2011 last colo   colo q 33yr - [facility]    Coronary artery disease 02/2009   a. 02/2009 Cath: severe left main and three-vessel-->CABG; b. 12/2016 MVL EF 49%, no isch/infarct.   DEGENERATIVE JOINT DISEASE    Diastolic dysfunction    a. 10/2021 Echo: EF 55-60%, no rwma, mod conc LVH, GrI DD, mildly reduced RV fxn, RVSP 26.8  mmHg. mild AI, and aortic sclerosis.   Diverticulosis of colon    DIVERTICULOSIS, COLON    GERD (gastroesophageal reflux disease)    History of kidney stones    HYPERLIPIDEMIA-MIXED    HYPERTENSION, BENIGN    Interstitial lung disease (HCC)    LBP (low back pain)    s/p decompression 8/14; ESI - [place]; RFA x 3 early 2015   Pacemaker    a. 05/2017: St. Jude (serial number [id number]) pacemaker for CHB   Symptomatic bradycardia    a. s/p St. Jude (serial number [id number]) pacemaker 06/16/17   Type II or unspecified type diabetes mellitus without mention of complication, not stated as uncontrolled    VITAMIN D  DEFICIENCY    Past Surgical History:  Procedure Laterality Date   CARPAL TUNNEL RELEASE Right    CATARACT EXTRACTION, BILATERAL     R 07/07/13, L 07/21/13   CORONARY ARTERY BYPASS GRAFT  03/17/09   x 4   CYSTOSCOPY WITH STENT PLACEMENT Left 05/10/2021   Procedure: CYSTOSCOPY WITH LEFT  STENT PLACEMENT,LEFT RETROGRADE PYELOGRAM;  Surgeon: Cam Morene ORN, MD;  Location: [facility];  Service: Urology;  Laterality: Left;   CYSTOSCOPY/URETEROSCOPY/HOLMIUM LASER/STENT PLACEMENT Left 06/03/2021   Procedure: CYSTOSCOPY, LEFT RETROGRADE, URETEROSCOPY,HOLMIUM LASER,STENT EXCHANGE;  Surgeon: Cam Morene ORN, MD;  Location: [facility];  Service: Urology;  Laterality: Left;   GLUTEUS MINIMUS  REPAIR Right 05/06/2024   Procedure: REPAIR, TENDON, GLUTEUS MINIMUS;  Surgeon: Genelle Standing, MD;  Location: [facility];  Service: Orthopedics;  Laterality: Right;  RIGHT GLUTEUS MAXIMUS TENDON TRANSFER   INGUINAL HERNIA REPAIR  1980   KNEE ARTHROSCOPY  2007   right   LUMBAR EPIDURAL INJECTION  2013   Has as needed.   LUMBAR LAMINECTOMY/DECOMPRESSION MICRODISCECTOMY N/A 06/19/2013   Procedure: LUMBAR LAMINECTOMY/DECOMPRESSION MICRODISCECTOMY;  Surgeon: Oneil Rodgers Priestly, MD;  Location: [facility];  Service: Orthopedics;  Laterality: N/A;  Lumbar 4-5 decompression   PACEMAKER IMPLANT N/A 06/16/2017    Procedure: Pacemaker Implant;  Surgeon: Waddell Danelle ORN, MD;  Location: [facility] CV [facility];  Service: Cardiovascular;  Laterality: N/A;   TRIGGER FINGER RELEASE Right    Family History  Problem Relation Age of Onset   Heart failure Mother        died age 58   Heart failure Father        died age 36   Diabetes Other    Heart disease Neg Hx    Social History   Occupational History   Not on file  Tobacco Use   Smoking status: Never   Smokeless tobacco: Never   Tobacco comments:    Married, 3 grown children. Retired 04/2002 from [employer] work  Psychologist, Educational Use   Vaping status: Never Used  Substance and Sexual Activity   Alcohol use: Yes    Comment: RARE   Drug use: No   Sexual activity: Not Currently   Tobacco Counseling Counseling given: Not Answered Tobacco comments: Married, 3 grown children. Retired 04/2002 from [employer] work  SDOH Screenings   Food Insecurity: No Food Insecurity (10/02/2024)  Housing: Low Risk (10/02/2024)  Transportation Needs: No Transportation Needs (10/02/2024)  Utilities: Not At Risk (10/02/2024)  Alcohol Screen: Low Risk (10/01/2023)  Depression (PHQ2-9): Low Risk (10/02/2024)  Financial Resource Strain: Low Risk (09/29/2024)  Physical Activity: Inactive (10/02/2024)  Social Connections: Socially Isolated (10/02/2024)  Stress: No Stress Concern Present (10/02/2024)  Tobacco Use: Low Risk (10/02/2024)  Health Literacy: Adequate Health Literacy (10/02/2024)   See flowsheets for full screening details  Depression Screen PHQ 2 & 9 Depression Scale- Over the past 2 weeks, how often have you been bothered by any of the following problems? Little interest or pleasure in doing things: 0 Feeling down, depressed, or hopeless (PHQ Adolescent also includes...irritable): 0 PHQ-2 Total Score: 0 Trouble falling or staying asleep, or sleeping too much: 0 Feeling tired or having little energy: 0 Poor appetite or overeating  (PHQ Adolescent also includes...weight loss): 0 Feeling bad about yourself - or that you are a failure or have let yourself or your family down: 0 Trouble concentrating on things, such as reading the newspaper or watching television (PHQ Adolescent also includes...like school work): 0 Moving or speaking so slowly that other people could have noticed. Or the opposite - being so fidgety or restless that you have been moving around a lot more than usual: 1 (uses a cane) Thoughts that you would be better off dead, or of hurting yourself in some way: 0 PHQ-9 Total Score: 1 If you checked off any problems, how difficult have these problems made it for you to do your work, take care of things at home, or get along with other people?: Not difficult at all  Depression Treatment Depression Interventions/Treatment : EYV7-0 Score <4 Follow-up Not Indicated     Goals Addressed  This Visit's Progress     Patient Stated (pt-stated)        Patient stated he plans to continue exercising              Objective:    Today's Vitals   10/02/24 1347  Weight: 150 lb (68 kg)  Height: 5' 7.5 (1.715 m)   Body mass index is 23.15 kg/m.  Hearing/Vision screen Hearing Screening - Comments:: Wears hearing aids Vision Screening - Comments:: Wears rx glasses - up to date with routine eye exams with Charlie Glendia Gaudy  Immunizations and Health Maintenance Health Maintenance  Topic Date Due   FOOT EXAM  07/06/2019   COVID-19 Vaccine (7 - 2025-26 season) 10/18/2024 (Originally 06/23/2024)   Zoster Vaccines- Shingrix (1 of 2) 12/31/2024 (Originally 06/14/1986)   DTaP/Tdap/Td (3 - Tdap) 10/02/2025 (Originally 03/27/2023)   OPHTHALMOLOGY EXAM  11/29/2024   HEMOGLOBIN A1C  04/02/2025   Medicare Annual Wellness (AWV)  10/02/2025   Pneumococcal Vaccine: 50+ Years  Completed   Influenza Vaccine  Completed   Meningococcal B Vaccine  Aged Out   Colonoscopy  Discontinued         Assessment/Plan:  This is a routine wellness examination for [place].  Patient Care Team: Geofm Glade PARAS, MD as PCP - General (Internal Medicine) Perla Evalene PARAS, MD as PCP - Cardiology (Cardiology) Waddell Danelle ORN, MD as PCP - Electrophysiology (Cardiology) Gaspar Kung, MD as Consulting Physician (Orthopedic Surgery) Beverley Toribio FALCON, MD (Inactive) as Consulting Physician (Orthopedic Surgery) Perla Evalene PARAS, MD as Consulting Physician (Cardiology) Beuford Anes, MD (Orthopedic Surgery) Celestia Agent, MD (Inactive) (Gastroenterology) Joshua Blamer, MD as Consulting Physician (Dermatology) Gaudy, Charlie Glendia, MD as Consulting Physician (Ophthalmology)  I have personally reviewed and noted the following in the patients chart:   Medical and social history Use of alcohol, tobacco or illicit drugs  Current medications and supplements including opioid prescriptions. Functional ability and status Nutritional status Physical activity Advanced directives List of other physicians Hospitalizations, surgeries, and ER visits in previous 12 months Vitals Screenings to include cognitive, depression, and falls Referrals and appointments  No orders of the defined types were placed in this encounter.  In addition, I have reviewed and discussed with patient certain preventive protocols, quality metrics, and best practice recommendations. A written personalized care plan for preventive services as well as general preventive health recommendations were provided to patient.   Verdie CHRISTELLA Saba, CMA   10/02/2024   Return in 1 year (on 10/02/2025).  After Visit Summary: (MyChart) Due to this being a telephonic visit, the after visit summary with patients personalized plan was offered to patient via MyChart   Nurse Notes: Appointment(s) made: (scheduled 2026 AWV/CPE appts)

## 2024-10-02 NOTE — Assessment & Plan Note (Signed)
 Chronic Regular exercise and healthy diet encouraged Check lipids  Continue atorvastatin 20 mg daily, Niacin 1000 mg nightly

## 2024-10-04 ENCOUNTER — Ambulatory Visit: Payer: Self-pay | Admitting: Internal Medicine

## 2024-10-13 ENCOUNTER — Ambulatory Visit: Admitting: Orthopedic Surgery

## 2024-10-13 DIAGNOSIS — M5136 Other intervertebral disc degeneration, lumbar region with discogenic back pain only: Secondary | ICD-10-CM

## 2024-10-13 NOTE — Progress Notes (Signed)
 Orthopedic Spine Surgery Office Note   Assessment: Patient is a 89 y.o. male with axial low back pain felt in the mid lumbar spine.  SPECT scan showing increased uptake at L1/2 and L2/3     Plan: -Patient has tried PT, Tylenol , NSAIDs, steroid injections, Norco -I went over the fact spine surgery for low back pain is not predictable and providing him relief.  He has tried multiple conservative treatments and is interested in surgical management.  Based off of his recent SPECT scan, I suspect that the L1/2 and L2/3 levels are his symptomatic levels.  Discussed L1-L3 XLIF and PSIF as an option for him.  After talking about this surgery, I went over again that surgery is not predictable at relieving back pain.  He wanted to proceed since he is tired of this pain and is willing to try surgery even if it is not predictable at providing relief -Patient will next be seen at date of surgery     The patient has axial lumbar back pain from suspected discogenic back pain at L1/2 and L2/3. The patient's symptoms were not getting improving in spite of over 1 year of conservative treatment so operative management was discussed in the form of L1-3 lateral interbody fusion and posterior instrumented spinal fusion as an option. The risks including but not limited to dural tear, nerve root injury, paralysis, pseudarthrosis, persistent pain, infection, bleeding, hardware failure, adjacent segment disease, vascular injury, psoas weakness, lumbar plexus injury, heart attack, death, stroke, fracture, dvt/pe, and need for additional procedures were discussed with the patient. The hope is that this surgery would help with his back pain but that surgery does not reliably relieve back pain. The alternatives to surgical management were covered with the patient and included continued monitoring, physical therapy, over-the-counter pain medications, ambulatory aids, repeat injections, and activity modification. All the patient's  questions were answered to his satisfaction. After this discussion, the patient expressed understanding and elected to proceed with surgical intervention.      ___________________________________________________________________________     History:   Patient is a 89 y.o. male who presents today for follow up on his lumbar spine.  Patient continues to have significant lumbar back pain.  He feels that in the mid lumbar region.  He has no radiating leg pain.  He has not noticed any changes in his symptoms since he was last seen in the office.  He has tried many conservative treatments over the last couple of years but has not noticed any improvement with those.  He feels the pain on a daily basis.  He is frustrated because he cannot do as much during the day as a result of this pain.  Has not developed any new symptoms since he was last seen in the office.   Treatments tried: PT, Tylenol , NSAIDs, steroid injections, Norco     Physical Exam:   General: no acute distress, appears stated age Neurologic: alert, answering questions appropriately, following commands Respiratory: unlabored breathing on room air, symmetric chest rise Psychiatric: appropriate affect, normal cadence to speech     MSK (spine):   -Strength exam                                                   Left  Right EHL                              5/5                  5/5 TA                                 5/5                  5/5 GSC                             5/5                  5/5 Knee extension            5/5                  5/5 Hip flexion                    5/5                  5/5   -Sensory exam                           Sensation intact to light touch in L3-S1 nerve distributions of bilateral lower extremities   Imaging: XRs of the lumbar spine from 09/04/2024 were independently reviewed and interpreted, showing multiple levels with disc height loss.  Vacuum disc phenomenon at L5/S1.  Lumbar  degenerative scoliosis.  No fracture or dislocation seen.  No evidence of instability on flexion/extension views.   MRI of the lumbar spine from 08/26/2024 was independently reviewed and interpreted, showing right sided foraminal stenosis at L2/3. Central and bilateral foraminal stenosis at L3/4. Lateral recess stenosis on the left at L5/S1. Left sided foraminal stenosis at L4/5 and L5/S1 - more significant at L5/S1. Right sided foraminal stenosis at L5/S1.   SPECT scan showed increased uptake in the L1/2 and L2/3 disc spaces. Increased uptake also seen at L4/5 on the left.      Patient name: Andrew [name] Patient MRN: [id number] Date of visit: 10/13/2024

## 2024-10-24 ENCOUNTER — Other Ambulatory Visit: Payer: Self-pay | Admitting: Cardiovascular Disease

## 2024-10-27 ENCOUNTER — Telehealth: Payer: Self-pay | Admitting: Cardiovascular Disease

## 2024-10-27 NOTE — Telephone Encounter (Signed)
 Pharmacy please advise on holding coumadin  prior to   L1-3 extreme lumbar interbody fusion and posterior instrumented spinal fusion  scheduled for TBD. Thank you.

## 2024-10-27 NOTE — Telephone Encounter (Signed)
"  ° °  Pre-operative Risk Assessment    Patient Name: Andrew [name]  DOB: 01-06-1934 MRN: [id number]   Date of last office visit: 10/02/24 Date of next office visit: n/a   Request for Surgical Clearance    Procedure:  L1-3 extreme lumbar interbody fusion and posterior instrumented spinal fusion  Date of Surgery:  Clearance TBD                                Surgeon:  Ozell Ada Surgeon's Group or Practice Name:  [facility] Phone number:  [phone] Fax number:  [phone]   Type of Clearance Requested:   - Pharmacy:  Hold Warfarin (Coumadin ) 5days prior   Type of Anesthesia:  Not Indicated   Additional requests/questions:    Signed, Arsenio Celine GAILS   10/27/2024, 2:11 PM   "

## 2024-10-31 NOTE — Telephone Encounter (Signed)
 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    Procedure:  L1-3 extreme lumbar interbody fusion and posterior instrumented spinal fusion   Date of Surgery:  Clearance TBD      CrCl 32 Platelet count 169  Per office protocol, patient can hold warfarin for 5 days prior to procedure.   Patient will not need bridging with Lovenox (enoxaparin) around procedure.  Coumadin  Clinic [facility] office) aware  **This guidance is not considered finalized until pre-operative APP has relayed final recommendations.**

## 2024-11-03 NOTE — Telephone Encounter (Signed)
"  ° °  Patient Name: Andrew [name]  DOB: 1933/11/17 MRN: [id number]  Primary Cardiologist: Evalene Lunger, MD  Clinical pharmacists have reviewed the patient's past medical history, labs, and current medications as part of preoperative protocol coverage. 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     Procedure:  L1-3 extreme lumbar interbody fusion and posterior instrumented spinal fusion   Date of Surgery:  Clearance TBD       CrCl 32 Platelet count 169   Per office protocol, patient can hold warfarin for 5 days prior to procedure.   Patient will not need bridging with Lovenox (enoxaparin) around procedure.   Coumadin  Clinic [facility] office) aware   I will route this recommendation to the requesting party via Epic fax function and remove from pre-op  pool.  Please call with questions.  Lamarr Satterfield, NP 11/03/2024, 10:28 AM  "

## 2024-11-26 ENCOUNTER — Ambulatory Visit

## 2024-11-26 DIAGNOSIS — I82409 Acute embolism and thrombosis of unspecified deep veins of unspecified lower extremity: Secondary | ICD-10-CM

## 2024-11-26 DIAGNOSIS — Z5181 Encounter for therapeutic drug level monitoring: Secondary | ICD-10-CM

## 2024-11-26 LAB — POCT INR: INR: 3.1 — AB (ref 2.0–3.0)

## 2024-11-26 NOTE — Patient Instructions (Signed)
 Back Surgery 2/24 Hold Coumadin  1/19-23. Take 1 tablet Daily, except 2 tablets every Monday, Wednesday and Friday.    INR in 5 weeks.  [phone]

## 2024-12-04 ENCOUNTER — Other Ambulatory Visit (HOSPITAL_COMMUNITY)

## 2024-12-16 ENCOUNTER — Inpatient Hospital Stay (HOSPITAL_COMMUNITY): Admit: 2024-12-16 | Admitting: Orthopedic Surgery

## 2024-12-16 DIAGNOSIS — Z01818 Encounter for other preprocedural examination: Secondary | ICD-10-CM

## 2024-12-16 SURGERY — ANTERIOR LATERAL LUMBAR FUSION 2 LEVELS
Anesthesia: General

## 2024-12-31 ENCOUNTER — Ambulatory Visit

## 2024-12-31 ENCOUNTER — Encounter: Admitting: Orthopedic Surgery

## 2025-04-02 ENCOUNTER — Ambulatory Visit: Admitting: Internal Medicine

## 2025-10-08 ENCOUNTER — Encounter: Admitting: Internal Medicine

## 2025-10-08 ENCOUNTER — Ambulatory Visit
